# Patient Record
Sex: Female | Born: 1940 | Race: Black or African American | Hispanic: No | Marital: Married | Smoking: Never smoker
Health system: Southern US, Community
[De-identification: ages and names within clinical notes are randomized; demographics above are authoritative.]

## PROBLEM LIST (undated history)

## (undated) DIAGNOSIS — G43909 Migraine, unspecified, not intractable, without status migrainosus: Secondary | ICD-10-CM

## (undated) DIAGNOSIS — E785 Hyperlipidemia, unspecified: Secondary | ICD-10-CM

## (undated) DIAGNOSIS — Z780 Asymptomatic menopausal state: Secondary | ICD-10-CM

## (undated) DIAGNOSIS — I10 Essential (primary) hypertension: Secondary | ICD-10-CM

## (undated) DIAGNOSIS — M545 Low back pain, unspecified: Secondary | ICD-10-CM

## (undated) HISTORY — DX: Essential (primary) hypertension: I10

## (undated) HISTORY — DX: Low back pain: M54.5

## (undated) HISTORY — DX: Hyperlipidemia, unspecified: E78.5

## (undated) HISTORY — DX: Migraine, unspecified, not intractable, without status migrainosus: G43.909

## (undated) HISTORY — DX: Low back pain, unspecified: M54.50

## (undated) HISTORY — DX: Asymptomatic menopausal state: Z78.0

---

## 1980-06-19 HISTORY — PX: CHOLECYSTECTOMY: SHX55

## 1982-06-19 HISTORY — PX: TUBAL LIGATION: SHX77

## 2012-10-21 ENCOUNTER — Ambulatory Visit: Payer: Self-pay

## 2012-11-16 ENCOUNTER — Ambulatory Visit: Payer: Self-pay

## 2012-12-18 ENCOUNTER — Ambulatory Visit: Payer: Self-pay | Admitting: Anesthesiology

## 2013-01-27 LAB — HM PAP SMEAR

## 2013-01-28 ENCOUNTER — Ambulatory Visit: Payer: Self-pay | Admitting: Anesthesiology

## 2013-02-10 ENCOUNTER — Ambulatory Visit: Payer: Self-pay | Admitting: Anesthesiology

## 2013-03-11 ENCOUNTER — Ambulatory Visit: Payer: Self-pay | Admitting: Anesthesiology

## 2013-03-24 ENCOUNTER — Ambulatory Visit: Payer: Self-pay | Admitting: Gastroenterology

## 2013-03-24 LAB — HM COLONOSCOPY

## 2013-08-26 ENCOUNTER — Ambulatory Visit: Payer: Self-pay | Admitting: Anesthesiology

## 2013-10-02 ENCOUNTER — Ambulatory Visit: Payer: Self-pay | Admitting: Anesthesiology

## 2014-03-12 LAB — HM MAMMOGRAPHY

## 2014-03-20 LAB — HM MAMMOGRAPHY

## 2014-03-26 ENCOUNTER — Ambulatory Visit: Payer: Self-pay | Admitting: Anesthesiology

## 2014-04-28 ENCOUNTER — Ambulatory Visit: Payer: Self-pay | Admitting: Anesthesiology

## 2014-08-04 DIAGNOSIS — I10 Essential (primary) hypertension: Secondary | ICD-10-CM | POA: Diagnosis not present

## 2014-08-04 DIAGNOSIS — H5203 Hypermetropia, bilateral: Secondary | ICD-10-CM | POA: Diagnosis not present

## 2014-08-04 DIAGNOSIS — H52223 Regular astigmatism, bilateral: Secondary | ICD-10-CM | POA: Diagnosis not present

## 2014-08-04 DIAGNOSIS — H35033 Hypertensive retinopathy, bilateral: Secondary | ICD-10-CM | POA: Diagnosis not present

## 2014-08-11 DIAGNOSIS — I129 Hypertensive chronic kidney disease with stage 1 through stage 4 chronic kidney disease, or unspecified chronic kidney disease: Secondary | ICD-10-CM | POA: Diagnosis not present

## 2014-08-11 DIAGNOSIS — I10 Essential (primary) hypertension: Secondary | ICD-10-CM | POA: Diagnosis not present

## 2014-08-11 DIAGNOSIS — R7301 Impaired fasting glucose: Secondary | ICD-10-CM | POA: Diagnosis not present

## 2014-08-11 DIAGNOSIS — R7302 Impaired glucose tolerance (oral): Secondary | ICD-10-CM | POA: Diagnosis not present

## 2015-03-13 ENCOUNTER — Other Ambulatory Visit: Payer: Self-pay | Admitting: Unknown Physician Specialty

## 2015-03-18 ENCOUNTER — Telehealth: Payer: Self-pay | Admitting: Unknown Physician Specialty

## 2015-03-18 NOTE — Telephone Encounter (Signed)
Refill for amlodipine sent to [facility] [place]

## 2015-03-18 NOTE — Telephone Encounter (Signed)
Ellen [name] refilled this medication 3 days ago for 3 days so I tried to call the patient to let her know. I was going to tell her to call her pharmacy and to not use the old rx number on the bottle. Asked for patient to please return my call so I could let her know this information.

## 2015-03-19 NOTE — Telephone Encounter (Signed)
Called and left patient a voicemail asking for her to please return my call.  

## 2015-03-22 NOTE — Telephone Encounter (Signed)
Called and left patient a voicemail asking for her to return my call. I was reading my past documentation for this encounter and I meant that the medication was refilled for 30 days and not 3 days.

## 2015-03-29 DIAGNOSIS — R809 Proteinuria, unspecified: Secondary | ICD-10-CM | POA: Insufficient documentation

## 2015-03-29 DIAGNOSIS — M545 Low back pain, unspecified: Secondary | ICD-10-CM | POA: Insufficient documentation

## 2015-03-29 DIAGNOSIS — Z780 Asymptomatic menopausal state: Secondary | ICD-10-CM | POA: Insufficient documentation

## 2015-03-29 DIAGNOSIS — R7301 Impaired fasting glucose: Secondary | ICD-10-CM

## 2015-03-29 DIAGNOSIS — G43909 Migraine, unspecified, not intractable, without status migrainosus: Secondary | ICD-10-CM | POA: Insufficient documentation

## 2015-03-29 DIAGNOSIS — I129 Hypertensive chronic kidney disease with stage 1 through stage 4 chronic kidney disease, or unspecified chronic kidney disease: Secondary | ICD-10-CM

## 2015-03-30 ENCOUNTER — Encounter: Payer: Self-pay | Admitting: Unknown Physician Specialty

## 2015-03-30 ENCOUNTER — Ambulatory Visit (INDEPENDENT_AMBULATORY_CARE_PROVIDER_SITE_OTHER): Payer: Medicare Other | Admitting: Unknown Physician Specialty

## 2015-03-30 VITALS — BP 138/84 | HR 84 | Temp 99.0°F | Ht 61.8 in | Wt 174.2 lb

## 2015-03-30 DIAGNOSIS — I129 Hypertensive chronic kidney disease with stage 1 through stage 4 chronic kidney disease, or unspecified chronic kidney disease: Secondary | ICD-10-CM | POA: Diagnosis not present

## 2015-03-30 DIAGNOSIS — Z0000 Encounter for general adult medical examination without abnormal findings: Secondary | ICD-10-CM | POA: Diagnosis not present

## 2015-03-30 DIAGNOSIS — Z23 Encounter for immunization: Secondary | ICD-10-CM

## 2015-03-30 DIAGNOSIS — R7301 Impaired fasting glucose: Secondary | ICD-10-CM

## 2015-03-30 LAB — MICROALBUMIN, URINE WAIVED
Creatinine, Urine Waived: 50 mg/dL (ref 10–300)
Microalb, Ur Waived: 10 mg/L (ref 0–19)

## 2015-03-30 LAB — LIPID PANEL PICCOLO, WAIVED
CHOLESTEROL PICCOLO, WAIVED: 204 mg/dL — AB (ref <200)
Chol/HDL Ratio Piccolo,Waive: 4.1 mg/dL
HDL CHOL PICCOLO, WAIVED: 50 mg/dL — AB (ref >59)
LDL CHOL CALC PICCOLO WAIVED: 114 mg/dL — AB (ref <100)
Triglycerides Piccolo,Waived: 196 mg/dL — ABNORMAL HIGH (ref <150)
VLDL Chol Calc Piccolo,Waive: 39 mg/dL — ABNORMAL HIGH (ref <30)

## 2015-03-30 LAB — BAYER DCA HB A1C WAIVED: HB A1C: 6.2 % (ref <7.0)

## 2015-03-30 MED ORDER — LISINOPRIL-HYDROCHLOROTHIAZIDE 20-12.5 MG PO TABS: 1.0000 | ORAL_TABLET | Freq: Every day | ORAL | Status: DC

## 2015-03-30 MED ORDER — AMLODIPINE BESYLATE 5 MG PO TABS: 5.0000 mg | ORAL_TABLET | Freq: Every day | ORAL | Status: DC

## 2015-03-30 NOTE — Patient Instructions (Addendum)
Health Maintenance  Topic Date Due  . ZOSTAVAX  06/15/2003  . INFLUENZA VACCINE  01/18/2016  . MAMMOGRAM  03/20/2016  . TETANUS/TDAP  07/21/2018  . COLONOSCOPY  03/25/2023  . DEXA SCAN  Completed  . PNA vac Low Risk Adult  Completed   Prescription given for shingles vaccine.  Please bring to your pharmacy

## 2015-03-30 NOTE — Assessment & Plan Note (Addendum)
Stable, continue present medications.  CMP pending

## 2015-03-30 NOTE — Progress Notes (Signed)
BP 138/84 mmHg  Pulse 84  Temp(Src) 99 F (37.2 C)  Ht 5' 1.8" (1.57 m)  Wt 174 lb 3.2 oz (79.017 kg)  BMI 32.06 kg/m2  SpO2 98%  LMP 11/28/1994 (Approximate)   Subjective:    Patient ID: Ellen [name], female    DOB: 12-26-1940, 74 y.o.   MRN: [id number]  HPI: Ellen [name] is a 74 y.o. female  Chief Complaint  Patient presents with  . Medicare Wellness   Hypertension This is a chronic problem. The current episode started today. The problem is controlled. Pertinent negatives include no blurred vision, chest pain, malaise/fatigue, palpitations or shortness of breath. There are no compliance problems.  There is no history of angina, kidney disease or heart failure.  Hyperlipidemia This is a chronic problem. The problem is controlled. Recent lipid tests were reviewed and are normal. There are no known factors aggravating her hyperlipidemia. Pertinent negatives include no chest pain, myalgias or shortness of breath. The current treatment provides significant improvement of lipids. There are no compliance problems.   Diabetes She presents for her follow-up diabetic visit. She has type 2 diabetes mellitus. Her disease course has been stable (6.6 last visit). There are no hypoglycemic associated symptoms. Pertinent negatives for diabetes include no blurred vision, no chest pain, no fatigue, no polydipsia, no polyuria and no weakness. There are no hypoglycemic complications. Symptoms are stable. Her weight is stable. She rarely participates in exercise. There is no compliance with monitoring of blood glucose.   Fall Risk  03/30/2015  Falls in the past year? No   Depression screen PHQ 2/9 03/30/2015  Decreased Interest 0  Down, Depressed, Hopeless 0  PHQ - 2 Score 0    Functional Status Survey: Is the patient deaf or have difficulty hearing?: No Does the patient have difficulty seeing, even when wearing glasses/contacts?: No Does the patient have difficulty concentrating,  remembering, or making decisions?: No Does the patient have difficulty walking or climbing stairs?: No Does the patient have difficulty dressing or bathing?: No Does the patient have difficulty doing errands alone such as visiting a doctor's office or shopping?: No  Advanced directives updated and information given.    Relevant past medical, surgical, family and social history reviewed and updated as indicated. Interim medical history since our last visit reviewed. Allergies and medications reviewed and updated.  See functional status, depression screen, and fall's risk assessment  under the appropriate section.    Pt is able to perform complex mental tasks, recognize clock face, recognize time and do a 3 item recall.     Relevant past medical, surgical, family and social history reviewed and updated as indicated. Interim medical history since our last visit reviewed. Allergies and medications reviewed and updated.  Review of Systems  Constitutional: Negative.  Negative for malaise/fatigue and fatigue.  HENT: Negative.   Eyes: Negative.  Negative for blurred vision.  Respiratory: Negative.  Negative for shortness of breath.   Cardiovascular: Negative.  Negative for chest pain and palpitations.  Gastrointestinal: Negative.   Endocrine: Negative.  Negative for polydipsia and polyuria.  Genitourinary: Negative.   Musculoskeletal: Negative.  Negative for myalgias.  Skin: Negative.   Allergic/Immunologic: Negative.   Neurological: Negative.  Negative for weakness.  Hematological: Negative.   Psychiatric/Behavioral: Negative.     Per HPI unless specifically indicated above     Objective:    BP 138/84 mmHg  Pulse 84  Temp(Src) 99 F (37.2 C)  Ht 5' 1.8" (1.57 m)  Wt 174 lb 3.2 oz (79.017 kg)  BMI 32.06 kg/m2  SpO2 98%  LMP 11/28/1994 (Approximate)  Wt Readings from Last 3 Encounters:  03/30/15 174 lb 3.2 oz (79.017 kg)  08/11/14 174 lb (78.926 kg)    Physical Exam   Constitutional: She is oriented to person, place, and time. She appears well-developed and well-nourished.  HENT:  Head: Normocephalic and atraumatic.  Eyes: Pupils are equal, round, and reactive to light. Right eye exhibits no discharge. Left eye exhibits no discharge. No scleral icterus.  Neck: Normal range of motion. Neck supple. Carotid bruit is not present. No thyromegaly present.  Cardiovascular: Normal rate, regular rhythm and normal heart sounds.  Exam reveals no gallop and no friction rub.   No murmur heard. Pulmonary/Chest: Effort normal and breath sounds normal. No respiratory distress. She has no wheezes. She has no rales.  Abdominal: Soft. Bowel sounds are normal. There is no tenderness. There is no rebound.  Genitourinary: No breast swelling, tenderness or discharge.  Musculoskeletal: Normal range of motion.  Lymphadenopathy:    She has no cervical adenopathy.  Neurological: She is alert and oriented to person, place, and time.  .Skin: Skin is warm, dry and intact. No rash noted.  Psychiatric: She has a normal mood and affect. Her speech is normal and behavior is normal. Judgment and thought content normal. Cognition and memory are normal.      Assessment & Plan:   Problem List Items Addressed This Visit      Unprioritized   IFG (impaired fasting glucose)    Hgb A1C is 6.2      Relevant Orders   Bayer DCA Hb A1c Waived   Hypertensive CKD (chronic kidney disease)    Stable, continue present medications.  CMP pending      Relevant Orders   Comprehensive metabolic panel   Bayer DCA Hb E9B Waived   Lipid Panel Piccolo, Waived   Microalbumin, Urine Waived   Uric acid    Other Visit Diagnoses    Immunization due    -  Primary    Relevant Orders    Flu Vaccine QUAD 36+ mos PF IM (Fluarix & Fluzone Quad PF) (Completed)    Routine general medical examination at a health care facility            Follow up plan: Return in about 6 months (around  09/28/2015).

## 2015-03-30 NOTE — Assessment & Plan Note (Signed)
Hgb A1C is 6.2

## 2015-03-31 ENCOUNTER — Encounter: Payer: Self-pay | Admitting: Unknown Physician Specialty

## 2015-03-31 LAB — COMPREHENSIVE METABOLIC PANEL
A/G RATIO: 1.4 (ref 1.1–2.5)
ALK PHOS: 97 IU/L (ref 39–117)
ALT: 18 IU/L (ref 0–32)
AST: 21 IU/L (ref 0–40)
Albumin: 4.2 g/dL (ref 3.5–4.8)
BUN/Creatinine Ratio: 19 (ref 11–26)
BUN: 18 mg/dL (ref 8–27)
CALCIUM: 9.7 mg/dL (ref 8.7–10.3)
CHLORIDE: 99 mmol/L (ref 97–108)
CO2: 27 mmol/L (ref 18–29)
Creatinine, Ser: 0.96 mg/dL (ref 0.57–1.00)
GFR calc Af Amer: 69 mL/min/{1.73_m2} (ref >59)
GFR, EST NON AFRICAN AMERICAN: 60 mL/min/{1.73_m2} (ref >59)
GLOBULIN, TOTAL: 2.9 g/dL (ref 1.5–4.5)
Glucose: 102 mg/dL — ABNORMAL HIGH (ref 65–99)
POTASSIUM: 4.1 mmol/L (ref 3.5–5.2)
SODIUM: 141 mmol/L (ref 134–144)
Total Protein: 7.1 g/dL (ref 6.0–8.5)

## 2015-03-31 LAB — URIC ACID: URIC ACID: 4.3 mg/dL (ref 2.5–7.1)

## 2015-03-31 LAB — LIPID PANEL W/O CHOL/HDL RATIO
CHOLESTEROL TOTAL: 206 mg/dL — AB (ref 100–199)
HDL: 45 mg/dL (ref >39)
LDL CALC: 124 mg/dL — AB (ref 0–99)
Triglycerides: 186 mg/dL — ABNORMAL HIGH (ref 0–149)
VLDL CHOLESTEROL CAL: 37 mg/dL (ref 5–40)

## 2015-07-07 ENCOUNTER — Telehealth: Payer: Self-pay | Admitting: Unknown Physician Specialty

## 2015-07-07 DIAGNOSIS — M545 Low back pain, unspecified: Secondary | ICD-10-CM

## 2015-07-07 NOTE — Telephone Encounter (Signed)
Called and left patient a voicemail asking for her to please return my call.  

## 2015-07-07 NOTE — Telephone Encounter (Signed)
Pt came in stated she has not been to the pain clinic in over a year and needs a new referral to see Dr. Madelaine Bhat [facility]. Pt stated she is having severe pain in her l left and ankle like she had before. Please call pt with any issues or concerns. Thanks.

## 2015-07-07 NOTE — Telephone Encounter (Signed)
Is this the back pain she was having before.  Left sided back pain radiating to ankle?

## 2015-07-07 NOTE — Telephone Encounter (Signed)
Routing to provider  

## 2015-07-08 NOTE — Telephone Encounter (Signed)
Called and left patient a message asking for her to please return my call.

## 2015-07-09 NOTE — Telephone Encounter (Signed)
I will refer her to the pain clinic

## 2015-07-09 NOTE — Telephone Encounter (Signed)
Routing to provider  

## 2015-07-09 NOTE — Telephone Encounter (Signed)
Pt came in stated it is the same back pain she had before that radiates down her leg into her feet. Please call pt @ [phone] if needed. Pt would like a referral to the [facility]. Thanks.

## 2015-09-28 ENCOUNTER — Ambulatory Visit: Payer: Medicare Other | Admitting: Unknown Physician Specialty

## 2015-10-04 ENCOUNTER — Encounter: Payer: Self-pay | Admitting: Unknown Physician Specialty

## 2015-10-04 ENCOUNTER — Ambulatory Visit (INDEPENDENT_AMBULATORY_CARE_PROVIDER_SITE_OTHER): Payer: Medicare Other | Admitting: Unknown Physician Specialty

## 2015-10-04 VITALS — BP 128/72 | HR 92 | Temp 98.6°F | Ht 61.5 in | Wt 171.0 lb

## 2015-10-04 DIAGNOSIS — R7301 Impaired fasting glucose: Secondary | ICD-10-CM

## 2015-10-04 DIAGNOSIS — I10 Essential (primary) hypertension: Secondary | ICD-10-CM | POA: Diagnosis not present

## 2015-10-04 DIAGNOSIS — I152 Hypertension secondary to endocrine disorders: Secondary | ICD-10-CM | POA: Insufficient documentation

## 2015-10-04 MED ORDER — AMLODIPINE BESYLATE 5 MG PO TABS: 5.0000 mg | ORAL_TABLET | Freq: Every day | ORAL | Status: DC

## 2015-10-04 MED ORDER — LISINOPRIL-HYDROCHLOROTHIAZIDE 20-12.5 MG PO TABS: 1.0000 | ORAL_TABLET | Freq: Every day | ORAL | Status: DC

## 2015-10-04 NOTE — Progress Notes (Signed)
BP 128/72 mmHg  Pulse 92  Temp(Src) 98.6 F (37 C)  Ht 5' 1.5" (1.562 m)  Wt 171 lb (77.565 kg)  BMI 31.79 kg/m2  SpO2 97%  LMP 11/28/1994 (Approximate)   Subjective:    Patient ID: Ellen [name], female    DOB: 1941/01/20, 75 y.o.   MRN: [id number]  HPI: Ellen [name] is a 75 y.o. female  Chief Complaint  Patient presents with  . Hypertension   Diabetes:  Diet controlled with last Hgb A1C of 6.2 No hypoglycemic episodes No hyperglycemic episodes Feet problems: none Blood Sugars averaging:Not checking  Hypertension:  Using medications without difficulty Average home BPs   Using medication without problems or lightheadedness No chest pain with exertion or shortness of breath No Edema        Relevant past medical, surgical, family and social history reviewed and updated as indicated. Interim medical history since our last visit reviewed. Allergies and medications reviewed and updated.  Review of Systems  Per HPI unless specifically indicated above     Objective:    BP 128/72 mmHg  Pulse 92  Temp(Src) 98.6 F (37 C)  Ht 5' 1.5" (1.562 m)  Wt 171 lb (77.565 kg)  BMI 31.79 kg/m2  SpO2 97%  LMP 11/28/1994 (Approximate)  Wt Readings from Last 3 Encounters:  10/04/15 171 lb (77.565 kg)  03/30/15 174 lb 3.2 oz (79.017 kg)  08/11/14 174 lb (78.926 kg)    Physical Exam  Constitutional: She is oriented to person, place, and time. She appears well-developed and well-nourished. No distress.  HENT:  Head: Normocephalic and atraumatic.  Eyes: Conjunctivae and lids are normal. Right eye exhibits no discharge. Left eye exhibits no discharge. No scleral icterus.  Neck: Normal range of motion. Neck supple. No JVD present. Carotid bruit is not present.  Cardiovascular: Normal rate, regular rhythm and normal heart sounds.   Pulmonary/Chest: Effort normal and breath sounds normal.  Abdominal: Normal appearance. There is no splenomegaly or hepatomegaly.   Musculoskeletal: Normal range of motion.  Neurological: She is alert and oriented to person, place, and time.  Skin: Skin is warm, dry and intact. No rash noted. No pallor.  Psychiatric: She has a normal mood and affect. Her behavior is normal. Judgment and thought content normal.    Results for orders placed or performed in visit on 03/30/15  Comprehensive metabolic panel  Result Value Ref Range   Glucose 102 (H) 65 - 99 mg/dL   BUN 18 8 - 27 mg/dL   Creatinine, Ser 4.09 0.57 - 1.00 mg/dL   GFR calc non Af Amer 60 >59 mL/min/1.73   GFR calc Af Amer 69 >59 mL/min/1.73   BUN/Creatinine Ratio 19 11 - 26   Sodium 141 134 - 144 mmol/L   Potassium 4.1 3.5 - 5.2 mmol/L   Chloride 99 97 - 108 mmol/L   CO2 27 18 - 29 mmol/L   Calcium 9.7 8.7 - 10.3 mg/dL   Total Protein 7.1 6.0 - 8.5 g/dL   Albumin 4.2 3.5 - 4.8 g/dL   Globulin, Total 2.9 1.5 - 4.5 g/dL   Albumin/Globulin Ratio 1.4 1.1 - 2.5   Bilirubin Total <0.2 0.0 - 1.2 mg/dL   Alkaline Phosphatase 97 39 - 117 IU/L   AST 21 0 - 40 IU/L   ALT 18 0 - 32 IU/L  Bayer DCA Hb A1c Waived  Result Value Ref Range   Bayer DCA Hb A1c Waived 6.2 <7.0 %  Lipid Panel Piccolo,  Waived  Result Value Ref Range   Cholesterol Piccolo, Waived 204 (H) <200 mg/dL   HDL Chol Piccolo, Waived 50 (L) >59 mg/dL   Triglycerides Piccolo,Waived 196 (H) <150 mg/dL   Chol/HDL Ratio Piccolo,Waive 4.1 mg/dL   LDL Chol Calc Piccolo Waived 114 (H) <100 mg/dL   VLDL Chol Calc Piccolo,Waive 39 (H) <30 mg/dL  Microalbumin, Urine Waived  Result Value Ref Range   Microalb, Ur Waived 10 0 - 19 mg/L   Creatinine, Urine Waived 50 10 - 300 mg/dL   Microalb/Creat Ratio 30-300 (H) <30 mg/g  Uric acid  Result Value Ref Range   Uric Acid 4.3 2.5 - 7.1 mg/dL  Lipid Panel w/o Chol/HDL Ratio  Result Value Ref Range   Cholesterol, Total 206 (H) 100 - 199 mg/dL   Triglycerides 621186 (H) 0 - 149 mg/dL   HDL 45 >30>39 mg/dL   VLDL Cholesterol Cal 37 5 - 40 mg/dL   LDL  Calculated 865124 (H) 0 - 99 mg/dL      Assessment & Plan:   Problem List Items Addressed This Visit      Unprioritized   Hypertension    Stable, continue present medications.        Relevant Medications   amLODipine (NORVASC) 5 MG tablet   lisinopril-hydrochlorothiazide (PRINZIDE,ZESTORETIC) 20-12.5 MG tablet   IFG (impaired fasting glucose) - Primary    Pt with a stable Hgb A1C for years.  Last was 6.2.  Weight is stable and even lost a few pounds          Follow up plan: Return for 6 months for PE.

## 2015-10-04 NOTE — Assessment & Plan Note (Signed)
Stable, continue present medications.   

## 2015-10-04 NOTE — Assessment & Plan Note (Addendum)
Pt with a stable Hgb A1C for years.  Last was 6.2.  Weight is stable and even lost a few pounds

## 2016-01-28 ENCOUNTER — Ambulatory Visit: Payer: Self-pay | Admitting: Unknown Physician Specialty

## 2016-01-28 ENCOUNTER — Ambulatory Visit (INDEPENDENT_AMBULATORY_CARE_PROVIDER_SITE_OTHER): Payer: Medicare Other | Admitting: Family Medicine

## 2016-01-28 ENCOUNTER — Encounter: Payer: Self-pay | Admitting: Family Medicine

## 2016-01-28 VITALS — BP 133/78 | HR 91 | Temp 98.7°F | Wt 173.0 lb

## 2016-01-28 DIAGNOSIS — S161XXA Strain of muscle, fascia and tendon at neck level, initial encounter: Secondary | ICD-10-CM | POA: Diagnosis not present

## 2016-01-28 MED ORDER — CYCLOBENZAPRINE HCL 10 MG PO TABS: 10.0000 mg | ORAL_TABLET | Freq: Three times a day (TID) | ORAL | 0 refills | Status: DC | PRN

## 2016-01-28 MED ORDER — KETOROLAC TROMETHAMINE 60 MG/2ML IM SOLN
60.0000 mg | Freq: Once | INTRAMUSCULAR | Status: AC
  Administered 2016-01-28: 60 mg via INTRAMUSCULAR

## 2016-01-28 NOTE — Progress Notes (Signed)
   BP 133/78   Pulse 91   Temp 98.7 F (37.1 C)   Wt 173 lb (78.5 kg)   LMP 11/28/1994 (Approximate)   SpO2 99%   BMI 32.16 kg/m    Subjective:    Patient ID: Ellen [name], female    DOB: 06-22-1940, 75 y.o.   MRN: [id number]  HPI: Ellen [name] is a 75 y.o. female  Chief Complaint  Patient presents with  . Neck Pain    stumbled on some cords on Sunday, didn't fall completely, she caught herself. Left side of neck hurts worse, hurts to turn her neck/head.    Left sided neck pain x 4 days following tripping on some cords. States she did not actually fall, just stumbled and feels like she jerked her neck. Is now having dull ache and stiffness in left neck. Denies radiation down arms or UE weakness. Denies sharp midline neck pain. Has been taking occasional aleve with some relief.   Relevant past medical, surgical, family and social history reviewed and updated as indicated. Interim medical history since our last visit reviewed. Allergies and medications reviewed and updated.  Review of Systems  Constitutional: Negative.   HENT: Negative.   Respiratory: Negative.   Cardiovascular: Negative.   Gastrointestinal: Negative.   Musculoskeletal: Positive for neck pain and neck stiffness.  Neurological: Negative.   Psychiatric/Behavioral: Negative.     Per HPI unless specifically indicated above     Objective:    BP 133/78   Pulse 91   Temp 98.7 F (37.1 C)   Wt 173 lb (78.5 kg)   LMP 11/28/1994 (Approximate)   SpO2 99%   BMI 32.16 kg/m   Wt Readings from Last 3 Encounters:  01/28/16 173 lb (78.5 kg)  10/04/15 171 lb (77.6 kg)  03/30/15 174 lb 3.2 oz (79 kg)    Physical Exam  Constitutional: She is oriented to person, place, and time. She appears well-developed and well-nourished.  HENT:  Head: Atraumatic.  Eyes: Conjunctivae are normal. No scleral icterus.  Neck: Neck supple.  Cardiovascular: Normal rate and normal heart sounds.   Pulmonary/Chest: Effort  normal. No respiratory distress.  Musculoskeletal: She exhibits tenderness (TTP over left trapezius to left cervical paraspinal muscles).  Reduced active ROM of neck d/t stiffness  Lymphadenopathy:    She has no cervical adenopathy.  Neurological: She is alert and oriented to person, place, and time.  Skin: Skin is warm and dry.  Psychiatric: She has a normal mood and affect. Her behavior is normal.      Assessment & Plan:   Problem List Items Addressed This Visit    None    Visit Diagnoses    Cervical strain, initial encounter    -  Primary   IM toradol given today, flexeril sent. Discussed possible side effects and recommended no driving on the medication due to sedation.    Relevant Medications   ketorolac (TORADOL) injection 60 mg (Completed)    Recommended gentle stretches, massages with biofreeze or icy hot, and warm epsom salt baths. Can resume aleve prn in several days.   Follow up plan: Return if symptoms worsen or fail to improve.

## 2016-01-28 NOTE — Patient Instructions (Signed)
Follow up as needed

## 2016-02-01 DIAGNOSIS — M542 Cervicalgia: Secondary | ICD-10-CM | POA: Diagnosis not present

## 2016-02-01 DIAGNOSIS — S161XXA Strain of muscle, fascia and tendon at neck level, initial encounter: Secondary | ICD-10-CM | POA: Diagnosis not present

## 2016-03-06 ENCOUNTER — Ambulatory Visit (INDEPENDENT_AMBULATORY_CARE_PROVIDER_SITE_OTHER): Payer: Medicare Other | Admitting: Unknown Physician Specialty

## 2016-03-06 ENCOUNTER — Encounter: Payer: Self-pay | Admitting: Unknown Physician Specialty

## 2016-03-06 VITALS — BP 136/79 | HR 91 | Temp 98.6°F | Ht 62.0 in | Wt 170.8 lb

## 2016-03-06 DIAGNOSIS — Z23 Encounter for immunization: Secondary | ICD-10-CM | POA: Diagnosis not present

## 2016-03-06 NOTE — Progress Notes (Signed)
BP 136/79 (BP Location: Left Arm)   Pulse 91   Temp 98.6 F (37 C)   Ht 5\' 2"  (1.575 m)   Wt 170 lb 12.8 oz (77.5 kg)   LMP 11/28/1994 (Approximate)   SpO2 99%   BMI 31.24 kg/m    Subjective:    Patient ID: Ellen [name], female    DOB: 1940/09/08, 75 y.o.   MRN: [id number]  HPI: Ellen [name] is a 75 y.o. female  Chief Complaint  Patient presents with  . Paperwork    pt states she cannot sit through jury duty    Pt is here to request jury duty excuse.  Upon further review, she is eligible to not serve on a jury due to age.    Relevant past medical, surgical, family and social history reviewed and updated as indicated. Interim medical history since our last visit reviewed. Allergies and medications reviewed and updated.  Review of Systems  Per HPI unless specifically indicated above     Objective:    BP 136/79 (BP Location: Left Arm)   Pulse 91   Temp 98.6 F (37 C)   Ht 5\' 2"  (1.575 m)   Wt 170 lb 12.8 oz (77.5 kg)   LMP 11/28/1994 (Approximate)   SpO2 99%   BMI 31.24 kg/m   Wt Readings from Last 3 Encounters:  03/06/16 170 lb 12.8 oz (77.5 kg)  01/28/16 173 lb (78.5 kg)  10/04/15 171 lb (77.6 kg)    Physical Exam  Results for orders placed or performed in visit on 03/30/15  Comprehensive metabolic panel  Result Value Ref Range   Glucose 102 (H) 65 - 99 mg/dL   BUN 18 8 - 27 mg/dL   Creatinine, Ser 6.440.96 0.57 - 1.00 mg/dL   GFR calc non Af Amer 60 >59 mL/min/1.73   GFR calc Af Amer 69 >59 mL/min/1.73   BUN/Creatinine Ratio 19 11 - 26   Sodium 141 134 - 144 mmol/L   Potassium 4.1 3.5 - 5.2 mmol/L   Chloride 99 97 - 108 mmol/L   CO2 27 18 - 29 mmol/L   Calcium 9.7 8.7 - 10.3 mg/dL   Total Protein 7.1 6.0 - 8.5 g/dL   Albumin 4.2 3.5 - 4.8 g/dL   Globulin, Total 2.9 1.5 - 4.5 g/dL   Albumin/Globulin Ratio 1.4 1.1 - 2.5   Bilirubin Total <0.2 0.0 - 1.2 mg/dL   Alkaline Phosphatase 97 39 - 117 IU/L   AST 21 0 - 40 IU/L   ALT 18 0 - 32  IU/L  Bayer DCA Hb A1c Waived  Result Value Ref Range   Bayer DCA Hb A1c Waived 6.2 <7.0 %  Lipid Panel Piccolo, Waived  Result Value Ref Range   Cholesterol Piccolo, Waived 204 (H) <200 mg/dL   HDL Chol Piccolo, Waived 50 (L) >59 mg/dL   Triglycerides Piccolo,Waived 196 (H) <150 mg/dL   Chol/HDL Ratio Piccolo,Waive 4.1 mg/dL   LDL Chol Calc Piccolo Waived 114 (H) <100 mg/dL   VLDL Chol Calc Piccolo,Waive 39 (H) <30 mg/dL  Microalbumin, Urine Waived  Result Value Ref Range   Microalb, Ur Waived 10 0 - 19 mg/L   Creatinine, Urine Waived 50 10 - 300 mg/dL   Microalb/Creat Ratio 30-300 (H) <30 mg/g  Uric acid  Result Value Ref Range   Uric Acid 4.3 2.5 - 7.1 mg/dL  Lipid Panel w/o Chol/HDL Ratio  Result Value Ref Range   Cholesterol, Total 206 (H) 100 -  199 mg/dL   Triglycerides 161 (H) 0 - 149 mg/dL   HDL 45 >09 mg/dL   VLDL Cholesterol Cal 37 5 - 40 mg/dL   LDL Calculated 604 (H) 0 - 99 mg/dL      Assessment & Plan:   Problem List Items Addressed This Visit    None    Visit Diagnoses    Immunization due    -  Primary   Relevant Orders   Flu vaccine HIGH DOSE PF (Completed)      No charge visit.  Discussed how she can excuse herself according to directions on the jury summons  Follow up plan: Return if symptoms worsen or fail to improve.

## 2016-03-06 NOTE — Patient Instructions (Signed)

## 2016-04-03 ENCOUNTER — Encounter: Payer: Self-pay | Admitting: Unknown Physician Specialty

## 2016-04-03 ENCOUNTER — Ambulatory Visit (INDEPENDENT_AMBULATORY_CARE_PROVIDER_SITE_OTHER): Payer: Medicare Other | Admitting: Unknown Physician Specialty

## 2016-04-03 VITALS — BP 129/74 | HR 97 | Temp 98.3°F | Ht 62.2 in | Wt 168.4 lb

## 2016-04-03 DIAGNOSIS — I10 Essential (primary) hypertension: Secondary | ICD-10-CM | POA: Diagnosis not present

## 2016-04-03 DIAGNOSIS — Z0000 Encounter for general adult medical examination without abnormal findings: Secondary | ICD-10-CM

## 2016-04-03 DIAGNOSIS — Z1231 Encounter for screening mammogram for malignant neoplasm of breast: Secondary | ICD-10-CM

## 2016-04-03 DIAGNOSIS — R7301 Impaired fasting glucose: Secondary | ICD-10-CM

## 2016-04-03 LAB — BAYER DCA HB A1C WAIVED: HB A1C (BAYER DCA - WAIVED): 6.5 % (ref <7.0)

## 2016-04-03 MED ORDER — LISINOPRIL-HYDROCHLOROTHIAZIDE 20-12.5 MG PO TABS: 1.0000 | ORAL_TABLET | Freq: Every day | ORAL | 1 refills | Status: DC

## 2016-04-03 MED ORDER — AMLODIPINE BESYLATE 5 MG PO TABS: 5.0000 mg | ORAL_TABLET | Freq: Every day | ORAL | 1 refills | Status: DC

## 2016-04-03 NOTE — Assessment & Plan Note (Signed)
Stable, continue present medications.   

## 2016-04-03 NOTE — Progress Notes (Signed)
BP 129/74 (BP Location: Left Arm, Patient Position: Sitting, Cuff Size: Large)   Pulse 97   Temp 98.3 F (36.8 C)   Ht 5' 2.2" (1.58 m)   Wt 168 lb 6.4 oz (76.4 kg)   LMP 11/28/1994 (Approximate)   SpO2 97%   BMI 30.60 kg/m    Subjective:    Patient ID: Ellen [name], female    DOB: 1940-11-08, 75 y.o.   MRN: [id number]  HPI: Ellen [name] is a 75 y.o. female  Chief Complaint  Patient presents with  . Medicare Wellness   Functional Status Survey: Is the patient deaf or have difficulty hearing?: No Does the patient have difficulty seeing, even when wearing glasses/contacts?: No Does the patient have difficulty concentrating, remembering, or making decisions?: No Does the patient have difficulty walking or climbing stairs?: No Does the patient have difficulty dressing or bathing?: No Does the patient have difficulty doing errands alone such as visiting a doctor's office or shopping?: No Fall Risk  04/03/2016 01/28/2016 03/30/2015  Falls in the past year? Yes Yes No  Number falls in past yr: 1 1 -  Injury with Fall? No No -   [facility] 04/03/2016 03/30/2015  Decreased Interest 0 0  Down, Depressed, Hopeless 0 0  PHQ - 2 Score 0 0  Altered sleeping 0 -  Tired, decreased energy 0 -  Change in appetite 0 -  Feeling bad or failure about yourself  0 -  Trouble concentrating 0 -  Moving slowly or fidgety/restless 0 -  Suicidal thoughts 0 -  PHQ-9 Score 0 -   Hypertension Using medications without difficulty Average home BPs   No problems or lightheadedness No chest pain with exertion or shortness of breath No Edema  Social History   Social History  . Marital status: Married    Spouse name: N/A  . Number of children: N/A  . Years of education: N/A   Occupational History  . Not on file.   Social History Main Topics  . Smoking status: Never Smoker  . Smokeless tobacco: Never Used  . Alcohol use No  . Drug use: No  . Sexual activity:  Yes   Other Topics Concern  . Not on file   Social History Narrative  . No narrative on file   Family History  Problem Relation Age of Onset  . Alzheimer's disease Mother   . Diabetes Mother   . Stroke Mother   . Hyperlipidemia Mother   . Stroke Father   . Hypertension Father   . Hyperlipidemia Father   . Diabetes Sister   . Diabetes Brother   . Hypertension Brother   . Diabetes Sister    Past Medical History:  Diagnosis Date  . Hypertension   . Lumbago   . Menopause   . Migraines    Past Surgical History:  Procedure Laterality Date  . CHOLECYSTECTOMY    . TUBAL LIGATION     -    Relevant past medical, surgical, family and social history reviewed and updated as indicated. Interim medical history since our last visit reviewed. Allergies and medications reviewed and updated.  Review of Systems  Constitutional: Negative.   HENT: Negative.   Eyes: Negative.   Respiratory: Negative.   Cardiovascular: Negative.   Gastrointestinal: Negative.   Endocrine: Negative.   Genitourinary: Negative.   Musculoskeletal: Negative.   Skin: Negative.   Allergic/Immunologic: Negative.   Neurological: Negative.   Hematological: Negative.   Psychiatric/Behavioral: Negative.  Per HPI unless specifically indicated above     Objective:    BP 129/74 (BP Location: Left Arm, Patient Position: Sitting, Cuff Size: Large)   Pulse 97   Temp 98.3 F (36.8 C)   Ht 5' 2.2" (1.58 m)   Wt 168 lb 6.4 oz (76.4 kg)   LMP 11/28/1994 (Approximate)   SpO2 97%   BMI 30.60 kg/m   Wt Readings from Last 3 Encounters:  04/03/16 168 lb 6.4 oz (76.4 kg)  03/06/16 170 lb 12.8 oz (77.5 kg)  01/28/16 173 lb (78.5 kg)    Physical Exam  Constitutional: She is oriented to person, place, and time. She appears well-developed and well-nourished.  HENT:  Head: Normocephalic and atraumatic.  Eyes: Pupils are equal, round, and reactive to light. Right eye exhibits no discharge. Left eye exhibits  no discharge. No scleral icterus.  Neck: Normal range of motion. Neck supple. Carotid bruit is not present. No thyromegaly present.  Cardiovascular: Normal rate, regular rhythm and normal heart sounds.  Exam reveals no gallop and no friction rub.   No murmur heard. Pulmonary/Chest: Effort normal and breath sounds normal. No respiratory distress. She has no wheezes. She has no rales.  Abdominal: Soft. Bowel sounds are normal. There is no tenderness. There is no rebound.  Genitourinary: No breast swelling, tenderness or discharge.  Musculoskeletal: Normal range of motion.  Lymphadenopathy:    She has no cervical adenopathy.  Neurological: She is alert and oriented to person, place, and time.  Skin: Skin is warm, dry and intact. No rash noted.  Psychiatric: She has a normal mood and affect. Her speech is normal and behavior is normal. Judgment and thought content normal. Cognition and memory are normal.    Results for orders placed or performed in visit on 03/30/15  Comprehensive metabolic panel  Result Value Ref Range   Glucose 102 (H) 65 - 99 mg/dL   BUN 18 8 - 27 mg/dL   Creatinine, Ser 4.09 0.57 - 1.00 mg/dL   GFR calc non Af Amer 60 >59 mL/min/1.73   GFR calc Af Amer 69 >59 mL/min/1.73   BUN/Creatinine Ratio 19 11 - 26   Sodium 141 134 - 144 mmol/L   Potassium 4.1 3.5 - 5.2 mmol/L   Chloride 99 97 - 108 mmol/L   CO2 27 18 - 29 mmol/L   Calcium 9.7 8.7 - 10.3 mg/dL   Total Protein 7.1 6.0 - 8.5 g/dL   Albumin 4.2 3.5 - 4.8 g/dL   Globulin, Total 2.9 1.5 - 4.5 g/dL   Albumin/Globulin Ratio 1.4 1.1 - 2.5   Bilirubin Total <0.2 0.0 - 1.2 mg/dL   Alkaline Phosphatase 97 39 - 117 IU/L   AST 21 0 - 40 IU/L   ALT 18 0 - 32 IU/L  Bayer DCA Hb A1c Waived  Result Value Ref Range   Bayer DCA Hb A1c Waived 6.2 <7.0 %  Lipid Panel Piccolo, Waived  Result Value Ref Range   Cholesterol Piccolo, Waived 204 (H) <200 mg/dL   HDL Chol Piccolo, Waived 50 (L) >59 mg/dL   Triglycerides  Piccolo,Waived 196 (H) <150 mg/dL   Chol/HDL Ratio Piccolo,Waive 4.1 mg/dL   LDL Chol Calc Piccolo Waived 114 (H) <100 mg/dL   VLDL Chol Calc Piccolo,Waive 39 (H) <30 mg/dL  Microalbumin, Urine Waived  Result Value Ref Range   Microalb, Ur Waived 10 0 - 19 mg/L   Creatinine, Urine Waived 50 10 - 300 mg/dL   Microalb/Creat Ratio 30-300 (H) <  30 mg/g  Uric acid  Result Value Ref Range   Uric Acid 4.3 2.5 - 7.1 mg/dL  Lipid Panel w/o Chol/HDL Ratio  Result Value Ref Range   Cholesterol, Total 206 (H) 100 - 199 mg/dL   Triglycerides 161 (H) 0 - 149 mg/dL   HDL 45 >09 mg/dL   VLDL Cholesterol Cal 37 5 - 40 mg/dL   LDL Calculated 604 (H) 0 - 99 mg/dL      Assessment & Plan:   Problem List Items Addressed This Visit      Unprioritized   Hypertension    Stable, continue present medications.        Relevant Medications   amLODipine (NORVASC) 5 MG tablet   lisinopril-hydrochlorothiazide (PRINZIDE,ZESTORETIC) 20-12.5 MG tablet   Other Relevant Orders   Comprehensive metabolic panel   Lipid Panel w/o Chol/HDL Ratio   IFG (impaired fasting glucose) - Primary   Relevant Orders   Bayer DCA Hb A1c Waived    Other Visit Diagnoses    Encounter for screening mammogram for breast cancer       Relevant Orders   MM DIGITAL SCREENING BILATERAL   Annual physical exam           Follow up plan: Return in about 6 months (around 10/02/2016).

## 2016-04-03 NOTE — Patient Instructions (Signed)
Please do call to schedule your mammogram; the number to schedule one at either [facility] or [facility] is [phone]   

## 2016-04-04 ENCOUNTER — Encounter: Payer: Self-pay | Admitting: Unknown Physician Specialty

## 2016-04-04 LAB — COMPREHENSIVE METABOLIC PANEL
ALK PHOS: 108 IU/L (ref 39–117)
ALT: 15 IU/L (ref 0–32)
AST: 24 IU/L (ref 0–40)
Albumin/Globulin Ratio: 1.3 (ref 1.2–2.2)
Albumin: 3.9 g/dL (ref 3.5–4.8)
BILIRUBIN TOTAL: 0.3 mg/dL (ref 0.0–1.2)
BUN / CREAT RATIO: 19 (ref 12–28)
BUN: 20 mg/dL (ref 8–27)
CHLORIDE: 101 mmol/L (ref 96–106)
CO2: 26 mmol/L (ref 18–29)
CREATININE: 1.06 mg/dL — AB (ref 0.57–1.00)
Calcium: 9.5 mg/dL (ref 8.7–10.3)
GFR calc Af Amer: 61 mL/min/{1.73_m2} (ref >59)
GFR calc non Af Amer: 53 mL/min/{1.73_m2} — ABNORMAL LOW (ref >59)
GLUCOSE: 106 mg/dL — AB (ref 65–99)
Globulin, Total: 2.9 g/dL (ref 1.5–4.5)
Potassium: 4 mmol/L (ref 3.5–5.2)
Sodium: 143 mmol/L (ref 134–144)
Total Protein: 6.8 g/dL (ref 6.0–8.5)

## 2016-04-04 LAB — LIPID PANEL W/O CHOL/HDL RATIO
CHOLESTEROL TOTAL: 180 mg/dL (ref 100–199)
HDL: 40 mg/dL (ref >39)
LDL CALC: 95 mg/dL (ref 0–99)
TRIGLYCERIDES: 223 mg/dL — AB (ref 0–149)
VLDL CHOLESTEROL CAL: 45 mg/dL — AB (ref 5–40)

## 2016-05-24 ENCOUNTER — Ambulatory Visit
Admission: RE | Admit: 2016-05-24 | Discharge: 2016-05-24 | Disposition: A | Discharge status: Home / Self Care | Payer: Medicare Other | Source: Ambulatory Visit | Attending: Unknown Physician Specialty | Admitting: Unknown Physician Specialty

## 2016-05-24 DIAGNOSIS — Z1231 Encounter for screening mammogram for malignant neoplasm of breast: Secondary | ICD-10-CM | POA: Insufficient documentation

## 2016-09-29 DIAGNOSIS — H35033 Hypertensive retinopathy, bilateral: Secondary | ICD-10-CM | POA: Diagnosis not present

## 2016-09-29 DIAGNOSIS — H5203 Hypermetropia, bilateral: Secondary | ICD-10-CM | POA: Diagnosis not present

## 2016-09-29 DIAGNOSIS — I10 Essential (primary) hypertension: Secondary | ICD-10-CM | POA: Diagnosis not present

## 2016-09-29 DIAGNOSIS — H524 Presbyopia: Secondary | ICD-10-CM | POA: Diagnosis not present

## 2016-10-02 ENCOUNTER — Ambulatory Visit (INDEPENDENT_AMBULATORY_CARE_PROVIDER_SITE_OTHER): Payer: Medicare Other | Admitting: Unknown Physician Specialty

## 2016-10-02 ENCOUNTER — Encounter: Payer: Self-pay | Admitting: Unknown Physician Specialty

## 2016-10-02 VITALS — BP 132/79 | HR 78 | Temp 98.6°F | Ht 61.7 in | Wt 164.6 lb

## 2016-10-02 DIAGNOSIS — R7301 Impaired fasting glucose: Secondary | ICD-10-CM | POA: Diagnosis not present

## 2016-10-02 DIAGNOSIS — I10 Essential (primary) hypertension: Secondary | ICD-10-CM | POA: Diagnosis not present

## 2016-10-02 LAB — LIPID PANEL PICCOLO, WAIVED
Chol/HDL Ratio Piccolo,Waive: 2.9 mg/dL
Cholesterol Piccolo, Waived: 172 mg/dL (ref <200)
HDL CHOL PICCOLO, WAIVED: 59 mg/dL (ref >59)
LDL Chol Calc Piccolo Waived: 86 mg/dL (ref <100)
TRIGLYCERIDES PICCOLO,WAIVED: 136 mg/dL (ref <150)
VLDL Chol Calc Piccolo,Waive: 27 mg/dL (ref <30)

## 2016-10-02 LAB — MICROALBUMIN, URINE WAIVED
Creatinine, Urine Waived: 200 mg/dL (ref 10–300)
Microalb, Ur Waived: 30 mg/L — ABNORMAL HIGH (ref 0–19)

## 2016-10-02 LAB — BAYER DCA HB A1C WAIVED: HB A1C (BAYER DCA - WAIVED): 6 % (ref <7.0)

## 2016-10-02 MED ORDER — AMLODIPINE BESYLATE 5 MG PO TABS: 5.0000 mg | ORAL_TABLET | Freq: Every day | ORAL | 1 refills | Status: DC

## 2016-10-02 MED ORDER — LISINOPRIL-HYDROCHLOROTHIAZIDE 20-12.5 MG PO TABS: 1.0000 | ORAL_TABLET | Freq: Every day | ORAL | 1 refills | Status: DC

## 2016-10-02 NOTE — Assessment & Plan Note (Signed)
Stable, continue present medications.   

## 2016-10-02 NOTE — Assessment & Plan Note (Addendum)
Hgb A1C is 6.0% This has been improved.  Will continue present lifestyle changes

## 2016-10-02 NOTE — Progress Notes (Signed)
BP 132/79 (BP Location: Left Arm, Cuff Size: Normal)   Pulse 78   Temp 98.6 F (37 C)   Ht 5' 1.7" (1.567 m)   Wt 164 lb 9.6 oz (74.7 kg)   LMP 11/28/1994 (Approximate)   SpO2 98%   BMI 30.40 kg/m    Subjective:    Patient ID: Ellen [name], female    DOB: 03-17-1941, 76 y.o.   MRN: [id number]  HPI: Ellen [name] is a 76 y.o. female  Chief Complaint  Patient presents with  . Hypertension  . IFG   Diabetes: Using medications without difficulties No hypoglycemic episodes No hyperglycemic episodes Feet problems:none Blood Sugars averaging:Not checking eye exam within last year Last Hgb A1C: 6.5  Hypertension  Using medications without difficulty Average home BPs Not checking   Using medication without problems or lightheadedness No chest pain with exertion or shortness of breath No Edema  Elevated Cholesterol Using medications without problems No Muscle aches  Diet: Trying to eat well Exercise:calisthenics at home.      Relevant past medical, surgical, family and social history reviewed and updated as indicated. Interim medical history since our last visit reviewed. Allergies and medications reviewed and updated.  Review of Systems  Per HPI unless specifically indicated above     Objective:    BP 132/79 (BP Location: Left Arm, Cuff Size: Normal)   Pulse 78   Temp 98.6 F (37 C)   Ht 5' 1.7" (1.567 m)   Wt 164 lb 9.6 oz (74.7 kg)   LMP 11/28/1994 (Approximate)   SpO2 98%   BMI 30.40 kg/m   Wt Readings from Last 3 Encounters:  10/02/16 164 lb 9.6 oz (74.7 kg)  04/03/16 168 lb 6.4 oz (76.4 kg)  03/06/16 170 lb 12.8 oz (77.5 kg)    Physical Exam  Constitutional: She is oriented to person, place, and time. She appears well-developed and well-nourished. No distress.  HENT:  Head: Normocephalic and atraumatic.  Eyes: Conjunctivae and lids are normal. Right eye exhibits no discharge. Left eye exhibits no discharge. No scleral icterus.    Neck: Normal range of motion. Neck supple. No JVD present. Carotid bruit is not present.  Cardiovascular: Normal rate, regular rhythm and normal heart sounds.   Pulmonary/Chest: Effort normal and breath sounds normal.  Abdominal: Normal appearance. There is no splenomegaly or hepatomegaly.  Musculoskeletal: Normal range of motion.  Neurological: She is alert and oriented to person, place, and time.  Skin: Skin is warm, dry and intact. No rash noted. No pallor.  Psychiatric: She has a normal mood and affect. Her behavior is normal. Judgment and thought content normal.    Results for orders placed or performed in visit on 04/03/16  Comprehensive metabolic panel  Result Value Ref Range   Glucose 106 (H) 65 - 99 mg/dL   BUN 20 8 - 27 mg/dL   Creatinine, Ser 9.52 (H) 0.57 - 1.00 mg/dL   GFR calc non Af Amer 53 (L) >59 mL/min/1.73   GFR calc Af Amer 61 >59 mL/min/1.73   BUN/Creatinine Ratio 19 12 - 28   Sodium 143 134 - 144 mmol/L   Potassium 4.0 3.5 - 5.2 mmol/L   Chloride 101 96 - 106 mmol/L   CO2 26 18 - 29 mmol/L   Calcium 9.5 8.7 - 10.3 mg/dL   Total Protein 6.8 6.0 - 8.5 g/dL   Albumin 3.9 3.5 - 4.8 g/dL   Globulin, Total 2.9 1.5 - 4.5 g/dL   Albumin/Globulin  Ratio 1.3 1.2 - 2.2   Bilirubin Total 0.3 0.0 - 1.2 mg/dL   Alkaline Phosphatase 108 39 - 117 IU/L   AST 24 0 - 40 IU/L   ALT 15 0 - 32 IU/L  Bayer DCA Hb A1c Waived  Result Value Ref Range   Bayer DCA Hb A1c Waived 6.5 <7.0 %  Lipid Panel w/o Chol/HDL Ratio  Result Value Ref Range   Cholesterol, Total 180 100 - 199 mg/dL   Triglycerides 696 (H) 0 - 149 mg/dL   HDL 40 >29 mg/dL   VLDL Cholesterol Cal 45 (H) 5 - 40 mg/dL   LDL Calculated 95 0 - 99 mg/dL      Assessment & Plan:   Problem List Items Addressed This Visit      Unprioritized   Hypertension    Stable, continue present medications.        Relevant Medications   amLODipine (NORVASC) 5 MG tablet   lisinopril-hydrochlorothiazide  (PRINZIDE,ZESTORETIC) 20-12.5 MG tablet   Other Relevant Orders   Comprehensive metabolic panel   Lipid Panel Piccolo, Waived   IFG (impaired fasting glucose) - Primary    Hgb A1C is 6.0% This has been improved.  Will continue present lifestyle changes      Relevant Orders   Bayer DCA Hb A1c Waived   Microalbumin, Urine Waived       Follow up plan: Return in about 6 months (around 04/03/2017) for PE.

## 2016-10-03 ENCOUNTER — Encounter: Payer: Self-pay | Admitting: Unknown Physician Specialty

## 2016-10-03 LAB — COMPREHENSIVE METABOLIC PANEL
A/G RATIO: 1.6 (ref 1.2–2.2)
ALK PHOS: 102 IU/L (ref 39–117)
ALT: 11 IU/L (ref 0–32)
AST: 18 IU/L (ref 0–40)
Albumin: 4 g/dL (ref 3.5–4.8)
BUN/Creatinine Ratio: 19 (ref 12–28)
BUN: 15 mg/dL (ref 8–27)
Bilirubin Total: <0.2 mg/dL (ref 0.0–1.2)
CALCIUM: 9.7 mg/dL (ref 8.7–10.3)
CHLORIDE: 101 mmol/L (ref 96–106)
CO2: 28 mmol/L (ref 18–29)
Creatinine, Ser: 0.78 mg/dL (ref 0.57–1.00)
GFR calc Af Amer: 87 mL/min/{1.73_m2} (ref >59)
GFR, EST NON AFRICAN AMERICAN: 76 mL/min/{1.73_m2} (ref >59)
GLOBULIN, TOTAL: 2.5 g/dL (ref 1.5–4.5)
Glucose: 102 mg/dL — ABNORMAL HIGH (ref 65–99)
POTASSIUM: 3.7 mmol/L (ref 3.5–5.2)
SODIUM: 143 mmol/L (ref 134–144)
Total Protein: 6.5 g/dL (ref 6.0–8.5)

## 2016-12-13 ENCOUNTER — Ambulatory Visit (INDEPENDENT_AMBULATORY_CARE_PROVIDER_SITE_OTHER): Payer: Medicare Other | Admitting: Unknown Physician Specialty

## 2016-12-13 ENCOUNTER — Ambulatory Visit: Payer: Self-pay | Admitting: Unknown Physician Specialty

## 2016-12-13 ENCOUNTER — Encounter: Payer: Self-pay | Admitting: Unknown Physician Specialty

## 2016-12-13 VITALS — BP 167/91 | HR 90 | Temp 98.2°F | Wt 161.2 lb

## 2016-12-13 DIAGNOSIS — M62838 Other muscle spasm: Secondary | ICD-10-CM

## 2016-12-13 MED ORDER — MELOXICAM 15 MG PO TABS: 15.0000 mg | ORAL_TABLET | Freq: Every day | ORAL | 0 refills | Status: DC

## 2016-12-13 MED ORDER — KETOROLAC TROMETHAMINE 60 MG/2ML IM SOLN
60.0000 mg | Freq: Once | INTRAMUSCULAR | Status: AC
  Administered 2016-12-13: 60 mg via INTRAMUSCULAR

## 2016-12-13 MED ORDER — TIZANIDINE HCL 4 MG PO TABS: 4.0000 mg | ORAL_TABLET | Freq: Four times a day (QID) | ORAL | 0 refills | Status: DC | PRN

## 2016-12-13 NOTE — Progress Notes (Signed)
BP (!) 167/91   Pulse 90   Temp 98.2 F (36.8 C)   Wt 161 lb 3.2 oz (73.1 kg)   LMP 11/28/1994 (Approximate)   SpO2 98%   BMI 29.77 kg/m    Subjective:    Patient ID: Ellen [name], female    DOB: August 05, 1940, 76 y.o.   MRN: [id number]  HPI: Ellen [name] is a 76 y.o. female  Chief Complaint  Patient presents with  . Neck Pain    pt states that the muscle on the left side of her neck to her shoulder is hurting, states it has been since last Tuesday    Neck pain Pt describes over a week of severe right sided neck pain which radiates to shoulder.  This has happened before and went to the ER.  Pain has not changed.  She is not taking anything besides applying biofreeze.  Pain is sharp but not radiating to arm or hand.  No feverl    Relevant past medical, surgical, family and social history reviewed and updated as indicated. Interim medical history since our last visit reviewed. Allergies and medications reviewed and updated.  Review of Systems  Per HPI unless specifically indicated above     Objective:    BP (!) 167/91   Pulse 90   Temp 98.2 F (36.8 C)   Wt 161 lb 3.2 oz (73.1 kg)   LMP 11/28/1994 (Approximate)   SpO2 98%   BMI 29.77 kg/m   Wt Readings from Last 3 Encounters:  12/13/16 161 lb 3.2 oz (73.1 kg)  10/02/16 164 lb 9.6 oz (74.7 kg)  04/03/16 168 lb 6.4 oz (76.4 kg)    Physical Exam  Constitutional: She is oriented to person, place, and time. She appears well-developed and well-nourished. No distress.  HENT:  Head: Normocephalic and atraumatic.  Eyes: Conjunctivae and lids are normal. Right eye exhibits no discharge. Left eye exhibits no discharge. No scleral icterus.  Neck: Normal range of motion. Neck supple. No JVD present. Carotid bruit is not present.  Cardiovascular: Normal rate, regular rhythm and normal heart sounds.   Pulmonary/Chest: Effort normal and breath sounds normal.  Abdominal: Normal appearance. There is no splenomegaly or  hepatomegaly.  Musculoskeletal: Normal range of motion.  Neurological: She is alert and oriented to person, place, and time.  Skin: Skin is warm, dry and intact. No rash noted. No pallor.  Psychiatric: She has a normal mood and affect. Her behavior is normal. Judgment and thought content normal.    Results for orders placed or performed in visit on 10/02/16  Comprehensive metabolic panel  Result Value Ref Range   Glucose 102 (H) 65 - 99 mg/dL   BUN 15 8 - 27 mg/dL   Creatinine, Ser 4.090.78 0.57 - 1.00 mg/dL   GFR calc non Af Amer 76 >59 mL/min/1.73   GFR calc Af Amer 87 >59 mL/min/1.73   BUN/Creatinine Ratio 19 12 - 28   Sodium 143 134 - 144 mmol/L   Potassium 3.7 3.5 - 5.2 mmol/L   Chloride 101 96 - 106 mmol/L   CO2 28 18 - 29 mmol/L   Calcium 9.7 8.7 - 10.3 mg/dL   Total Protein 6.5 6.0 - 8.5 g/dL   Albumin 4.0 3.5 - 4.8 g/dL   Globulin, Total 2.5 1.5 - 4.5 g/dL   Albumin/Globulin Ratio 1.6 1.2 - 2.2   Bilirubin Total <0.2 0.0 - 1.2 mg/dL   Alkaline Phosphatase 102 39 - 117 IU/L   AST  18 0 - 40 IU/L   ALT 11 0 - 32 IU/L  Lipid Panel Piccolo, Waived  Result Value Ref Range   Cholesterol Piccolo, Waived 172 <200 mg/dL   HDL Chol Piccolo, Waived 59 >59 mg/dL   Triglycerides Piccolo,Waived 136 <150 mg/dL   Chol/HDL Ratio Piccolo,Waive 2.9 mg/dL   LDL Chol Calc Piccolo Waived 86 <100 mg/dL   VLDL Chol Calc Piccolo,Waive 27 <30 mg/dL  Bayer DCA Hb Z6X Waived  Result Value Ref Range   Bayer DCA Hb A1c Waived 6.0 <7.0 %  Microalbumin, Urine Waived  Result Value Ref Range   Microalb, Ur Waived 30 (H) 0 - 19 mg/L   Creatinine, Urine Waived 200 10 - 300 mg/dL   Microalb/Creat Ratio <30 <30 mg/g      Assessment & Plan:   Problem List Items Addressed This Visit    None    Visit Diagnoses    Spasm of cervical paraspinous muscle    -  Primary   Toradol in the office.  Mobic to start tomorrow.  Tizanidine prn.     Relevant Medications   ketorolac (TORADOL) injection 60 mg  (Completed)       Follow up plan: Return if symptoms worsen or fail to improve.

## 2017-03-07 ENCOUNTER — Telehealth: Payer: Self-pay | Admitting: Unknown Physician Specialty

## 2017-03-07 NOTE — Telephone Encounter (Signed)
Called pt to schedule for Annual Wellness Visit with Nurse Health Advisor, Tiffany Hill, my c/b # is [phone]  Kathryn Brown ° °

## 2017-04-06 ENCOUNTER — Telehealth: Payer: Self-pay | Admitting: Unknown Physician Specialty

## 2017-04-06 MED ORDER — AMLODIPINE BESYLATE 5 MG PO TABS: 5.0000 mg | ORAL_TABLET | Freq: Every day | ORAL | 1 refills | Status: DC

## 2017-04-06 MED ORDER — LISINOPRIL-HYDROCHLOROTHIAZIDE 20-12.5 MG PO TABS: 1.0000 | ORAL_TABLET | Freq: Every day | ORAL | 1 refills | Status: DC

## 2017-04-06 NOTE — Telephone Encounter (Signed)
Refills sent

## 2017-04-06 NOTE — Telephone Encounter (Signed)
Patient needs refills on the following she is out of the meds   Amlodipine  lisonopril   [facility]  Thank you

## 2017-04-06 NOTE — Telephone Encounter (Signed)
Routing to provider  

## 2017-04-13 ENCOUNTER — Ambulatory Visit (INDEPENDENT_AMBULATORY_CARE_PROVIDER_SITE_OTHER): Payer: Medicare Other

## 2017-04-13 VITALS — BP 138/76 | HR 89 | Temp 98.5°F | Resp 17 | Ht 62.0 in | Wt 161.7 lb

## 2017-04-13 DIAGNOSIS — Z23 Encounter for immunization: Secondary | ICD-10-CM | POA: Diagnosis not present

## 2017-04-13 DIAGNOSIS — Z0000 Encounter for general adult medical examination without abnormal findings: Secondary | ICD-10-CM

## 2017-04-13 NOTE — Patient Instructions (Addendum)
Ellen [name] , Thank you for taking time to come for your Medicare Wellness Visit. I appreciate your ongoing commitment to your health goals. Please review the following plan we discussed and let me know if I can assist you in the future.   Screening recommendations/referrals: Colonoscopy: completed 03/28/2013 Mammogram: completed 05/25/2016 Bone Density: completed 08/11/2013 Recommended yearly ophthalmology/optometry visit for glaucoma screening and checkup Recommended yearly dental visit for hygiene and checkup  Vaccinations: Influenza vaccine: due now Pneumococcal vaccine: up to date Tdap vaccine: up to date  Shingles vaccine: due, check with your insurance company for coverage  Advanced directives: Advance directive discussed with you today. I have provided a copy for you to complete at home and have notarized. Once this is complete please bring a copy in to our office so we can scan it into your chart.  Conditions/risks identified: Recommend drinking at least 5-6 glasses of water day   Next appointment: Follow up on 04/17/2017 at 8:00am with [name]. Follow up in one year for your annual wellness exam.    Preventive Care 65 Years and Older, Female Preventive care refers to lifestyle choices and visits with your health care provider that can promote health and wellness. What does preventive care include?  A yearly physical exam. This is also called an annual well check.  Dental exams once or twice a year.  Routine eye exams. Ask your health care provider how often you should have your eyes checked.  Personal lifestyle choices, including:  Daily care of your teeth and gums.  Regular physical activity.  Eating a healthy diet.  Avoiding tobacco and drug use.  Limiting alcohol use.  Practicing safe sex.  Taking low-dose aspirin every day.  Taking vitamin and mineral supplements as recommended by your health care provider. What happens during an annual well  check? The services and screenings done by your health care provider during your annual well check will depend on your age, overall health, lifestyle risk factors, and family history of disease. Counseling  Your health care provider may ask you questions about your:  Alcohol use.  Tobacco use.  Drug use.  Emotional well-being.  Home and relationship well-being.  Sexual activity.  Eating habits.  History of falls.  Memory and ability to understand (cognition).  Work and work Astronomer.  Reproductive health. Screening  You may have the following tests or measurements:  Height, weight, and BMI.  Blood pressure.  Lipid and cholesterol levels. These may be checked every 5 years, or more frequently if you are over 19 years old.  Skin check.  Lung cancer screening. You may have this screening every year starting at age 5 if you have a 30-pack-year history of smoking and currently smoke or have quit within the past 15 years.  Fecal occult blood test (FOBT) of the stool. You may have this test every year starting at age 19.  Flexible sigmoidoscopy or colonoscopy. You may have a sigmoidoscopy every 5 years or a colonoscopy every 10 years starting at age 30.  Hepatitis C blood test.  Hepatitis B blood test.  Sexually transmitted disease (STD) testing.  Diabetes screening. This is done by checking your blood sugar (glucose) after you have not eaten for a while (fasting). You may have this done every 1-3 years.  Bone density scan. This is done to screen for osteoporosis. You may have this done starting at age 64.  Mammogram. This may be done every 1-2 years. Talk to your health care provider about how  often you should have regular mammograms. Talk with your health care provider about your test results, treatment options, and if necessary, the need for more tests. Vaccines  Your health care provider may recommend certain vaccines, such as:  Influenza vaccine. This is  recommended every year.  Tetanus, diphtheria, and acellular pertussis (Tdap, Td) vaccine. You may need a Td booster every 10 years.  Zoster vaccine. You may need this after age 85.  Pneumococcal 13-valent conjugate (PCV13) vaccine. One dose is recommended after age 102.  Pneumococcal polysaccharide (PPSV23) vaccine. One dose is recommended after age 57. Talk to your health care provider about which screenings and vaccines you need and how often you need them. This information is not intended to replace advice given to you by your health care provider. Make sure you discuss any questions you have with your health care provider. Document Released: 07/02/2015 Document Revised: 02/23/2016 Document Reviewed: 04/06/2015 Elsevier Interactive Patient Education  2017 [place] Prevention in the Home Falls can cause injuries. They can happen to people of all ages. There are many things you can do to make your home safe and to help prevent falls. What can I do on the outside of my home?  Regularly fix the edges of walkways and driveways and fix any cracks.  Remove anything that might make you trip as you walk through a door, such as a raised step or threshold.  Trim any bushes or trees on the path to your home.  Use bright outdoor lighting.  Clear any walking paths of anything that might make someone trip, such as rocks or tools.  Regularly check to see if handrails are loose or broken. Make sure that both sides of any steps have handrails.  Any raised decks and porches should have guardrails on the edges.  Have any leaves, snow, or ice cleared regularly.  Use sand or salt on walking paths during winter.  Clean up any spills in your garage right away. This includes oil or grease spills. What can I do in the bathroom?  Use night lights.  Install grab bars by the toilet and in the tub and shower. Do not use towel bars as grab bars.  Use non-skid mats or decals in the tub or  shower.  If you need to sit down in the shower, use a plastic, non-slip stool.  Keep the floor dry. Clean up any water that spills on the floor as soon as it happens.  Remove soap buildup in the tub or shower regularly.  Attach bath mats securely with double-sided non-slip rug tape.  Do not have throw rugs and other things on the floor that can make you trip. What can I do in the bedroom?  Use night lights.  Make sure that you have a light by your bed that is easy to reach.  Do not use any sheets or blankets that are too big for your bed. They should not hang down onto the floor.  Have a firm chair that has side arms. You can use this for support while you get dressed.  Do not have throw rugs and other things on the floor that can make you trip. What can I do in the kitchen?  Clean up any spills right away.  Avoid walking on wet floors.  Keep items that you use a lot in easy-to-reach places.  If you need to reach something above you, use a strong step stool that has a grab bar.  Keep electrical  cords out of the way.  Do not use floor polish or wax that makes floors slippery. If you must use wax, use non-skid floor wax.  Do not have throw rugs and other things on the floor that can make you trip. What can I do with my stairs?  Do not leave any items on the stairs.  Make sure that there are handrails on both sides of the stairs and use them. Fix handrails that are broken or loose. Make sure that handrails are as long as the stairways.  Check any carpeting to make sure that it is firmly attached to the stairs. Fix any carpet that is loose or worn.  Avoid having throw rugs at the top or bottom of the stairs. If you do have throw rugs, attach them to the floor with carpet tape.  Make sure that you have a light switch at the top of the stairs and the bottom of the stairs. If you do not have them, ask someone to add them for you. What else can I do to help prevent  falls?  Wear shoes that:  Do not have high heels.  Have rubber bottoms.  Are comfortable and fit you well.  Are closed at the toe. Do not wear sandals.  If you use a stepladder:  Make sure that it is fully opened. Do not climb a closed stepladder.  Make sure that both sides of the stepladder are locked into place.  Ask someone to hold it for you, if possible.  Clearly mark and make sure that you can see:  Any grab bars or handrails.  First and last steps.  Where the edge of each step is.  Use tools that help you move around (mobility aids) if they are needed. These include:  Canes.  Walkers.  Scooters.  Crutches.  Turn on the lights when you go into a dark area. Replace any light bulbs as soon as they burn out.  Set up your furniture so you have a clear path. Avoid moving your furniture around.  If any of your floors are uneven, fix them.  If there are any pets around you, be aware of where they are.  Review your medicines with your doctor. Some medicines can make you feel dizzy. This can increase your chance of falling. Ask your doctor what other things that you can do to help prevent falls. This information is not intended to replace advice given to you by your health care provider. Make sure you discuss any questions you have with your health care provider. Document Released: 04/01/2009 Document Revised: 11/11/2015 Document Reviewed: 07/10/2014 Elsevier Interactive Patient Education  2017 Elsevier Inc[employer]  Influenza (Flu) Vaccine (Inactivated or Recombinant): What You Need to Know 1. Why get vaccinated? Influenza ("flu") is a contagious disease that spreads around the [place] every year, usually between October and May. Flu is caused by influenza viruses, and is spread mainly by coughing, sneezing, and close contact. Anyone can get flu. Flu strikes suddenly and can last several days. Symptoms vary by age, but can include:  fever/chills  sore  throat  muscle aches  fatigue  cough  headache  runny or stuffy nose  Flu can also lead to pneumonia and blood infections, and cause diarrhea and seizures in children. If you have a medical condition, such as heart or lung disease, flu can make it worse. Flu is more dangerous for some people. Infants and young children, people 10 years of age and older, pregnant women,  and people with certain health conditions or a weakened immune system are at greatest risk. Each year thousands of people in the [place] die from flu, and many more are hospitalized. Flu vaccine can:  keep you from getting flu,  make flu less severe if you do get it, and  keep you from spreading flu to your family and other people. 2. Inactivated and recombinant flu vaccines A dose of flu vaccine is recommended every flu season. Children 6 months through 12 years of age may need two doses during the same flu season. Everyone else needs only one dose each flu season. Some inactivated flu vaccines contain a very small amount of a mercury-based preservative called thimerosal. Studies have not shown thimerosal in vaccines to be harmful, but flu vaccines that do not contain thimerosal are available. There is no live flu virus in flu shots. They cannot cause the flu. There are many flu viruses, and they are always changing. Each year a new flu vaccine is made to protect against three or four viruses that are likely to cause disease in the upcoming flu season. But even when the vaccine doesn't exactly match these viruses, it may still provide some protection. Flu vaccine cannot prevent:  flu that is caused by a virus not covered by the vaccine, or  illnesses that look like flu but are not.  It takes about 2 weeks for protection to develop after vaccination, and protection lasts through the flu season. 3. Some people should not get this vaccine Tell the person who is giving you the vaccine:  If you have any severe,  life-threatening allergies. If you ever had a life-threatening allergic reaction after a dose of flu vaccine, or have a severe allergy to any part of this vaccine, you may be advised not to get vaccinated. Most, but not all, types of flu vaccine contain a small amount of egg protein.  If you ever had Guillain-Barr Syndrome (also called GBS). Some people with a history of GBS should not get this vaccine. This should be discussed with your doctor.  If you are not feeling well. It is usually okay to get flu vaccine when you have a mild illness, but you might be asked to come back when you feel better.  4. Risks of a vaccine reaction With any medicine, including vaccines, there is a chance of reactions. These are usually mild and go away on their own, but serious reactions are also possible. Most people who get a flu shot do not have any problems with it. Minor problems following a flu shot include:  soreness, redness, or swelling where the shot was given  hoarseness  sore, red or itchy eyes  cough  fever  aches  headache  itching  fatigue  If these problems occur, they usually begin soon after the shot and last 1 or 2 days. More serious problems following a flu shot can include the following:  There may be a small increased risk of Guillain-Barre Syndrome (GBS) after inactivated flu vaccine. This risk has been estimated at 1 or 2 additional cases per million people vaccinated. This is much lower than the risk of severe complications from flu, which can be prevented by flu vaccine.  Young children who get the flu shot along with pneumococcal vaccine (PCV13) and/or DTaP vaccine at the same time might be slightly more likely to have a seizure caused by fever. Ask your doctor for more information. Tell your doctor if a child who is getting  flu vaccine has ever had a seizure.  Problems that could happen after any injected vaccine:  People sometimes faint after a medical procedure,  including vaccination. Sitting or lying down for about 15 minutes can help prevent fainting, and injuries caused by a fall. Tell your doctor if you feel dizzy, or have vision changes or ringing in the ears.  Some people get severe pain in the shoulder and have difficulty moving the arm where a shot was given. This happens very rarely.  Any medication can cause a severe allergic reaction. Such reactions from a vaccine are very rare, estimated at about 1 in a million doses, and would happen within a few minutes to a few hours after the vaccination. As with any medicine, there is a very remote chance of a vaccine causing a serious injury or death. The safety of vaccines is always being monitored. For more information, visit: http://floyd.org/www.cdc.gov/vaccinesafety/ 5. What if there is a serious reaction? What should I look for? Look for anything that concerns you, such as signs of a severe allergic reaction, very high fever, or unusual behavior. Signs of a severe allergic reaction can include hives, swelling of the face and throat, difficulty breathing, a fast heartbeat, dizziness, and weakness. These would start a few minutes to a few hours after the vaccination. What should I do?  If you think it is a severe allergic reaction or other emergency that can't wait, call 9-1-1 and get the person to the nearest hospital. Otherwise, call your doctor.  Reactions should be reported to the Vaccine Adverse Event Reporting System (VAERS). Your doctor should file this report, or you can do it yourself through the VAERS web site at www.vaers.LAgents.nohhs.gov, or by calling 1-919 864 8433. ? VAERS does not give medical advice. 6. The National Vaccine Injury Compensation Program The [employer] Vaccine Injury Compensation Program (VICP) is a federal program that was created to compensate people who may have been injured by certain vaccines. Persons who believe they may have been injured by a vaccine can learn about the program and about  filing a claim by calling 1-414-541-5914 or visiting the VICP website at SpiritualWord.atwww.hrsa.gov/vaccinecompensation. There is a time limit to file a claim for compensation. 7. How can I learn more?  Ask your healthcare provider. He or she can give you the vaccine package insert or suggest other sources of information.  Call your local or state health department.  Contact the Centers for Disease Control and Prevention (CDC): ? Call 419-762-78111-442-293-3311 (1-800-CDC-INFO) or ? Visit CDC's website at BiotechRoom.com.cywww.cdc.gov/flu Vaccine Information Statement, Inactivated Influenza Vaccine (01/23/2014) This information is not intended to replace advice given to you by your health care provider. Make sure you discuss any questions you have with your health care provider. Document Released: 03/30/2006 Document Revised: 02/24/2016 Document Reviewed: 02/24/2016 Elsevier Interactive Patient Education  2017 [employer]

## 2017-04-13 NOTE — Progress Notes (Signed)
Subjective:   Ellen [name] is a 76 y.o. female who presents for Medicare Annual (Subsequent) preventive examination.  Review of Systems:   Cardiac Risk Factors include: advanced age (>60men, >20 women);hypertension     Objective:     Vitals: BP 138/76 (BP Location: Left Arm, Cuff Size: Normal)   Pulse 89   Temp 98.5 F (36.9 C) (Oral)   Resp 17   Ht 5\' 2"  (1.575 m)   Wt 161 lb 11.2 oz (73.3 kg)   LMP 11/28/1994 (Approximate)   BMI 29.58 kg/m   Body mass index is 29.58 kg/m.   Tobacco History  Smoking Status  . Never Smoker  Smokeless Tobacco  . Never Used     Counseling given: Not Answered   Past Medical History:  Diagnosis Date  . Hypertension   . Lumbago   . Menopause   . Migraines    Past Surgical History:  Procedure Laterality Date  . CHOLECYSTECTOMY    . TUBAL LIGATION     Family History  Problem Relation Age of Onset  . Alzheimer's disease Mother   . Diabetes Mother   . Stroke Mother   . Hyperlipidemia Mother   . Stroke Father   . Hypertension Father   . Hyperlipidemia Father   . Diabetes Sister   . Diabetes Brother   . Hypertension Brother   . Diabetes Sister    History  Sexual Activity  . Sexual activity: Yes    Outpatient Encounter Prescriptions as of 04/13/2017  Medication Sig  . amLODipine (NORVASC) 5 MG tablet Take 1 tablet (5 mg total) by mouth daily.  Marland Kitchen aspirin 81 MG tablet Take 81 mg by mouth daily.  . cholecalciferol (VITAMIN D) 400 UNITS TABS tablet Take 400 Units by mouth daily.  Marland Kitchen lisinopril-hydrochlorothiazide (PRINZIDE,ZESTORETIC) 20-12.5 MG tablet Take 1 tablet by mouth daily.  . Magnesium 250 MG TABS Take 250 mg by mouth daily.  Marland Kitchen UNABLE TO FIND Med Name: Omega XL- 2 capsules daily  . [DISCONTINUED] meloxicam (MOBIC) 15 MG tablet Take 1 tablet (15 mg total) by mouth daily. (Patient not taking: Reported on 04/13/2017)  . [DISCONTINUED] tiZANidine (ZANAFLEX) 4 MG tablet Take 1 tablet (4 mg total) by mouth every 6  (six) hours as needed for muscle spasms. (Patient not taking: Reported on 04/13/2017)   No facility-administered encounter medications on file as of 04/13/2017.     Activities of Daily Living In your present state of health, do you have any difficulty performing the following activities: 04/13/2017  Hearing? N  Vision? N  Difficulty concentrating or making decisions? N  Walking or climbing stairs? N  Dressing or bathing? N  Doing errands, shopping? N  Preparing Food and eating ? N  Using the Toilet? N  In the past six months, have you accidently leaked urine? N  Do you have problems with loss of bowel control? N  Managing your Medications? N  Managing your Finances? N  Housekeeping or managing your Housekeeping? N  Some recent data might be hidden    Patient Care Team: Gabriel Cirri, NP as PCP - General (Nurse Practitioner)    Assessment:     Exercise Activities and Dietary recommendations Current Exercise Habits: Home exercise routine, Type of exercise: walking, Time (Minutes): 30, Frequency (Times/Week): 3, Weekly Exercise (Minutes/Week): 90, Intensity: Mild, Exercise limited by: None identified  Goals    . Increase water intake          Recommend drinking at least 5-6 glasses  of water day       Fall Risk Fall Risk  04/13/2017 04/03/2016 01/28/2016 03/30/2015  Falls in the past year? No Yes Yes No  Number falls in past yr: - 1 1 -  Injury with Fall? - No No -   Depression Screen PHQ 2/9 Scores 04/13/2017 04/03/2016 03/30/2015  PHQ - 2 Score 0 0 0  PHQ- 9 Score 0 0 -     Cognitive Function     6CIT Screen 04/13/2017  What Year? 0 points  What month? 0 points  What time? 0 points  Count back from 20 0 points  Months in reverse 0 points  Repeat phrase 0 points  Total Score 0    Immunization History  Administered Date(s) Administered  . Influenza, High Dose Seasonal PF 03/06/2016, 04/13/2017  . Influenza,inj,Quad PF,6+ Mos 03/30/2015  . Pneumococcal  Conjugate-13 02/02/2014  . Pneumococcal Polysaccharide-23 01/27/2013  . Td 07/21/2008   Screening Tests Health Maintenance  Topic Date Due  . MAMMOGRAM  05/24/2018  . TETANUS/TDAP  07/21/2018  . COLONOSCOPY  03/25/2023  . INFLUENZA VACCINE  Completed  . DEXA SCAN  Completed  . PNA vac Low Risk Adult  Completed      Plan:     I have personally reviewed and addressed the Medicare Annual Wellness questionnaire and have noted the following in the patient's chart:  A. Medical and social history B. Use of alcohol, tobacco or illicit drugs  C. Current medications and supplements D. Functional ability and status E.  Nutritional status F.  Physical activity G. Advance directives H. List of other physicians I.  Hospitalizations, surgeries, and ER visits in previous 12 months J.  Vitals K. Screenings such as hearing and vision if needed, cognitive and depression L. Referrals and appointments   In addition, I have reviewed and discussed with patient certain preventive protocols, quality metrics, and best practice recommendations. A written personalized care plan for preventive services as well as general preventive health recommendations were provided to patient.   Signed,  Marin Robertsiffany Hill, LPN Nurse Health Advisor   MD Recommendations: none

## 2017-04-17 ENCOUNTER — Ambulatory Visit (INDEPENDENT_AMBULATORY_CARE_PROVIDER_SITE_OTHER): Payer: Medicare Other | Admitting: Unknown Physician Specialty

## 2017-04-17 ENCOUNTER — Encounter: Payer: Self-pay | Admitting: Unknown Physician Specialty

## 2017-04-17 VITALS — BP 157/82 | HR 75 | Temp 97.1°F | Ht 62.0 in | Wt 160.2 lb

## 2017-04-17 DIAGNOSIS — I129 Hypertensive chronic kidney disease with stage 1 through stage 4 chronic kidney disease, or unspecified chronic kidney disease: Secondary | ICD-10-CM

## 2017-04-17 DIAGNOSIS — Z7189 Other specified counseling: Secondary | ICD-10-CM | POA: Diagnosis not present

## 2017-04-17 DIAGNOSIS — I10 Essential (primary) hypertension: Secondary | ICD-10-CM | POA: Diagnosis not present

## 2017-04-17 DIAGNOSIS — Z0000 Encounter for general adult medical examination without abnormal findings: Secondary | ICD-10-CM

## 2017-04-17 DIAGNOSIS — R7301 Impaired fasting glucose: Secondary | ICD-10-CM

## 2017-04-17 MED ORDER — LISINOPRIL-HYDROCHLOROTHIAZIDE 20-25 MG PO TABS: 1.0000 | ORAL_TABLET | Freq: Every day | ORAL | 3 refills | Status: DC

## 2017-04-17 MED ORDER — AMLODIPINE BESYLATE 5 MG PO TABS: 5.0000 mg | ORAL_TABLET | Freq: Every day | ORAL | 1 refills | Status: DC

## 2017-04-17 NOTE — Assessment & Plan Note (Signed)
No to goal.  Increase Lisinopril to 20-25.  Continue amlodipine

## 2017-04-17 NOTE — Patient Instructions (Addendum)
Preventive Care 74 Years and Older, Female Preventive care refers to lifestyle choices and visits with your health care provider that can promote health and wellness. What does preventive care include?  A yearly physical exam. This is also called an annual well check.  Dental exams once or twice a year.  Routine eye exams. Ask your health care provider how often you should have your eyes checked.  Personal lifestyle choices, including: ? Daily care of your teeth and gums. ? Regular physical activity. ? Eating a healthy diet. ? Avoiding tobacco and drug use. ? Limiting alcohol use. ? Practicing safe sex. ? Taking low-dose aspirin every day. ? Taking vitamin and mineral supplements as recommended by your health care provider. What happens during an annual well check? The services and screenings done by your health care provider during your annual well check will depend on your age, overall health, lifestyle risk factors, and family history of disease. Counseling Your health care provider may ask you questions about your:  Alcohol use.  Tobacco use.  Drug use.  Emotional well-being.  Home and relationship well-being.  Sexual activity.  Eating habits.  History of falls.  Memory and ability to understand (cognition).  Work and work environment.  Reproductive health.  Screening You may have the following tests or measurements:  Height, weight, and BMI.  Blood pressure.  Lipid and cholesterol levels. These may be checked every 5 years, or more frequently if you are over 50 years old.  Skin check.  Lung cancer screening. You may have this screening every year starting at age 74 if you have a 30-pack-year history of smoking and currently smoke or have quit within the past 15 years.  Fecal occult blood test (FOBT) of the stool. You may have this test every year starting at age 74.  Flexible sigmoidoscopy or colonoscopy. You may have a sigmoidoscopy every 5 years or  a colonoscopy every 10 years starting at age 74.  Hepatitis C blood test.  Hepatitis B blood test.  Sexually transmitted disease (STD) testing.  Diabetes screening. This is done by checking your blood sugar (glucose) after you have not eaten for a while (fasting). You may have this done every 1-3 years.  Bone density scan. This is done to screen for osteoporosis. You may have this done starting at age 74.  Mammogram. This may be done every 1-2 years. Talk to your health care provider about how often you should have regular mammograms.  Talk with your health care provider about your test results, treatment options, and if necessary, the need for more tests. Vaccines Your health care provider may recommend certain vaccines, such as:  Influenza vaccine. This is recommended every year.  Tetanus, diphtheria, and acellular pertussis (Tdap, Td) vaccine. You may need a Td booster every 10 years.  Varicella vaccine. You may need this if you have not been vaccinated.  Zoster vaccine. You may need this after age 74.  Measles, mumps, and rubella (MMR) vaccine. You may need at least one dose of MMR if you were born in 1957 or later. You may also need a second dose.  Pneumococcal 13-valent conjugate (PCV13) vaccine. One dose is recommended after age 74.  Pneumococcal polysaccharide (PPSV23) vaccine. One dose is recommended after age 74.  Meningococcal vaccine. You may need this if you have certain conditions.  Hepatitis A vaccine. You may need this if you have certain conditions or if you travel or work in places where you may be exposed to hepatitis   A.  Hepatitis B vaccine. You may need this if you have certain conditions or if you travel or work in places where you may be exposed to hepatitis B.  Haemophilus influenzae type b (Hib) vaccine. You may need this if you have certain conditions.  Talk to your health care provider about which screenings and vaccines you need and how often you  need them. This information is not intended to replace advice given to you by your health care provider. Make sure you discuss any questions you have with your health care provider. Document Released: 07/02/2015 Document Revised: 02/23/2016 Document Reviewed: 04/06/2015 Elsevier Interactive Patient Education  2017 [employer].

## 2017-04-17 NOTE — Assessment & Plan Note (Signed)
A voluntary discussion about advance care planning including the explanation and discussion of advance directives was extensively discussed  with the patient.  Explanation about the health care proxy and Living will was reviewed and packet with forms with explanation of how to fill them out was given.  During this discussion, the patient was able to identify a health care proxy as her husband and plans to fill out the paperwork required.  Patient was offered a separate Advance Care Planning visit for further assistance with forms.    

## 2017-04-17 NOTE — Progress Notes (Signed)
BP (!) 157/82   Pulse 75   Temp (!) 97.1 F (36.2 C)   Ht 5\' 2"  (1.575 m)   Wt 160 lb 3.2 oz (72.7 kg)   LMP 11/28/1994 (Approximate)   SpO2 98%   BMI 29.30 kg/m    Subjective:    Patient ID: Ellen [name], female    DOB: 09/06/1940, 76 y.o.   MRN: [id number]  HPI: Ellen [name] is a 76 y.o. female  Chief Complaint  Patient presents with  . Annual Exam    pt had wellness exam with [facility] 04/13/17   Hypertension Using medications without difficulty Average home BPs Not checking   No problems or lightheadedness but complains of a mild headache this AM No chest pain with exertion or shortness of breath No Edema   Social History   Social History  . Marital status: Married    Spouse name: N/A  . Number of children: N/A  . Years of education: N/A   Occupational History  . Not on file.   Social History Main Topics  . Smoking status: Never Smoker  . Smokeless tobacco: Never Used  . Alcohol use No  . Drug use: No  . Sexual activity: Yes   Other Topics Concern  . Not on file   Social History Narrative  . No narrative on file   Family History  Problem Relation Age of Onset  . Alzheimer's disease Mother   . Diabetes Mother   . Stroke Mother   . Hyperlipidemia Mother   . Stroke Father   . Hypertension Father   . Hyperlipidemia Father   . Diabetes Sister   . Diabetes Brother   . Hypertension Brother   . Diabetes Sister    Past Medical History:  Diagnosis Date  . Hypertension   . Lumbago   . Menopause   . Migraines    Past Surgical History:  Procedure Laterality Date  . CHOLECYSTECTOMY    . TUBAL LIGATION      Relevant past medical, surgical, family and social history reviewed and updated as indicated. Interim medical history since our last visit reviewed. Allergies and medications reviewed and updated.  Review of Systems  Constitutional: Negative.   HENT: Negative.   Eyes: Negative.   Respiratory: Negative.   Cardiovascular:  Negative.   Gastrointestinal: Negative.   Endocrine: Negative.   Genitourinary: Negative.   Musculoskeletal: Negative.   Skin: Negative.   Allergic/Immunologic: Negative.   Neurological: Negative.   Hematological: Negative.   Psychiatric/Behavioral: Negative.     Per HPI unless specifically indicated above     Objective:    BP (!) 157/82   Pulse 75   Temp (!) 97.1 F (36.2 C)   Ht 5\' 2"  (1.575 m)   Wt 160 lb 3.2 oz (72.7 kg)   LMP 11/28/1994 (Approximate)   SpO2 98%   BMI 29.30 kg/m   Wt Readings from Last 3 Encounters:  04/17/17 160 lb 3.2 oz (72.7 kg)  04/13/17 161 lb 11.2 oz (73.3 kg)  12/13/16 161 lb 3.2 oz (73.1 kg)    Physical Exam  Constitutional: She is oriented to person, place, and time. She appears well-developed and well-nourished.  HENT:  Head: Normocephalic and atraumatic.  Eyes: Pupils are equal, round, and reactive to light. Right eye exhibits no discharge. Left eye exhibits no discharge. No scleral icterus.  Neck: Normal range of motion. Neck supple. Carotid bruit is not present. No thyromegaly present.  Cardiovascular: Normal rate, regular rhythm and  normal heart sounds.  Exam reveals no gallop and no friction rub.   No murmur heard. Pulmonary/Chest: Effort normal and breath sounds normal. No respiratory distress. She has no wheezes. She has no rales.  Abdominal: Soft. Bowel sounds are normal. There is no tenderness. There is no rebound.  Genitourinary: No breast swelling, tenderness or discharge.  Musculoskeletal: Normal range of motion.  Lymphadenopathy:    She has no cervical adenopathy.  Neurological: She is alert and oriented to person, place, and time.  Skin: Skin is warm, dry and intact. No rash noted.  Psychiatric: She has a normal mood and affect. Her speech is normal and behavior is normal. Judgment and thought content normal. Cognition and memory are normal.    Results for orders placed or performed in visit on 10/02/16  Comprehensive  metabolic panel  Result Value Ref Range   Glucose 102 (H) 65 - 99 mg/dL   BUN 15 8 - 27 mg/dL   Creatinine, Ser 1.610.78 0.57 - 1.00 mg/dL   GFR calc non Af Amer 76 >59 mL/min/1.73   GFR calc Af Amer 87 >59 mL/min/1.73   BUN/Creatinine Ratio 19 12 - 28   Sodium 143 134 - 144 mmol/L   Potassium 3.7 3.5 - 5.2 mmol/L   Chloride 101 96 - 106 mmol/L   CO2 28 18 - 29 mmol/L   Calcium 9.7 8.7 - 10.3 mg/dL   Total Protein 6.5 6.0 - 8.5 g/dL   Albumin 4.0 3.5 - 4.8 g/dL   Globulin, Total 2.5 1.5 - 4.5 g/dL   Albumin/Globulin Ratio 1.6 1.2 - 2.2   Bilirubin Total <0.2 0.0 - 1.2 mg/dL   Alkaline Phosphatase 102 39 - 117 IU/L   AST 18 0 - 40 IU/L   ALT 11 0 - 32 IU/L  Lipid Panel Piccolo, Waived  Result Value Ref Range   Cholesterol Piccolo, Waived 172 <200 mg/dL   HDL Chol Piccolo, Waived 59 >59 mg/dL   Triglycerides Piccolo,Waived 136 <150 mg/dL   Chol/HDL Ratio Piccolo,Waive 2.9 mg/dL   LDL Chol Calc Piccolo Waived 86 <100 mg/dL   VLDL Chol Calc Piccolo,Waive 27 <30 mg/dL  Bayer DCA Hb W9UA1c Waived  Result Value Ref Range   Bayer DCA Hb A1c Waived 6.0 <7.0 %  Microalbumin, Urine Waived  Result Value Ref Range   Microalb, Ur Waived 30 (H) 0 - 19 mg/L   Creatinine, Urine Waived 200 10 - 300 mg/dL   Microalb/Creat Ratio <30 <30 mg/g      Assessment & Plan:   Problem List Items Addressed This Visit      Unprioritized   Advanced care planning/counseling discussion    A voluntary discussion about advance care planning including the explanation and discussion of advance directives was extensively discussed  with the patient.  Explanation about the health care proxy and Living will was reviewed and packet with forms with explanation of how to fill them out was given.  During this discussion, the patient was able to identify a health care proxy as her husband  and plans to fill out the paperwork required.  Patient was offered a separate Advance Care Planning visit for further assistance with  forms.         Hypertension    No to goal.  Increase Lisinopril to 20-25.  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

## 2017-04-18 ENCOUNTER — Encounter: Payer: Self-pay | Admitting: Unknown Physician Specialty

## 2017-04-18 LAB — COMPREHENSIVE METABOLIC PANEL
A/G RATIO: 1.5 (ref 1.2–2.2)
ALT: 17 IU/L (ref 0–32)
AST: 20 IU/L (ref 0–40)
Albumin: 4.3 g/dL (ref 3.5–4.8)
Alkaline Phosphatase: 107 IU/L (ref 39–117)
BUN / CREAT RATIO: 15 (ref 12–28)
BUN: 11 mg/dL (ref 8–27)
CALCIUM: 9.7 mg/dL (ref 8.7–10.3)
CO2: 29 mmol/L (ref 20–29)
CREATININE: 0.73 mg/dL (ref 0.57–1.00)
Chloride: 102 mmol/L (ref 96–106)
GFR calc non Af Amer: 82 mL/min/{1.73_m2} (ref >59)
GFR, EST AFRICAN AMERICAN: 94 mL/min/{1.73_m2} (ref >59)
GLUCOSE: 101 mg/dL — AB (ref 65–99)
Globulin, Total: 2.9 g/dL (ref 1.5–4.5)
Potassium: 3.8 mmol/L (ref 3.5–5.2)
Sodium: 144 mmol/L (ref 134–144)
Total Protein: 7.2 g/dL (ref 6.0–8.5)

## 2017-04-18 LAB — LIPID PANEL W/O CHOL/HDL RATIO
Cholesterol, Total: 174 mg/dL (ref 100–199)
HDL: 53 mg/dL (ref >39)
LDL CALC: 97 mg/dL (ref 0–99)
Triglycerides: 121 mg/dL (ref 0–149)
VLDL CHOLESTEROL CAL: 24 mg/dL (ref 5–40)

## 2017-04-18 LAB — HGB A1C W/O EAG: HEMOGLOBIN A1C: 6.2 % — AB (ref 4.8–5.6)

## 2017-04-19 LAB — UA/M W/RFLX CULTURE, ROUTINE
BILIRUBIN UA: NEGATIVE
Glucose, UA: NEGATIVE
KETONES UA: NEGATIVE
NITRITE UA: NEGATIVE
Protein, UA: NEGATIVE
RBC UA: NEGATIVE
SPEC GRAV UA: 1.01 (ref 1.005–1.030)
UUROB: 0.2 mg/dL (ref 0.2–1.0)
pH, UA: 7 (ref 5.0–7.5)

## 2017-04-19 LAB — MICROSCOPIC EXAMINATION: Bacteria, UA: NONE SEEN

## 2017-05-16 ENCOUNTER — Ambulatory Visit (INDEPENDENT_AMBULATORY_CARE_PROVIDER_SITE_OTHER): Payer: Medicare Other | Admitting: Unknown Physician Specialty

## 2017-05-16 ENCOUNTER — Encounter: Payer: Self-pay | Admitting: Unknown Physician Specialty

## 2017-05-16 DIAGNOSIS — I10 Essential (primary) hypertension: Secondary | ICD-10-CM | POA: Diagnosis not present

## 2017-05-16 DIAGNOSIS — M791 Myalgia, unspecified site: Secondary | ICD-10-CM | POA: Diagnosis not present

## 2017-05-16 NOTE — Assessment & Plan Note (Addendum)
Stable, continue present medications. Has refills

## 2017-05-16 NOTE — Assessment & Plan Note (Signed)
New problem.  Discussed with pt that OTC Tylenol would be safest as long as occasional use.  May try other NSAIDs as kidney function is fine.  Encouraged activity

## 2017-05-16 NOTE — Progress Notes (Signed)
   BP 133/80 (BP Location: Left Arm, Cuff Size: Normal)   Pulse 89   Temp 98.2 F (36.8 C) (Oral)   Wt 158 lb (71.7 kg)   LMP 11/28/1994 (Approximate)   SpO2 94%   BMI 28.90 kg/m    Subjective:    Patient ID: Ellen [name], female    DOB: May 30, 1941, 76 y.o.   MRN: [id number]  HPI: Ellen FieldMary E Rozenberg is a 76 y.o. female  Chief Complaint  Patient presents with  . Hypertension    4 week f/up    Hypertension Using medications without difficulty Average home BPs typically 120s/70s   No problems or lightheadedness No chest pain with exertion or shortness of breath No Edema  Muscle stiffness Pt states she gets stiff in the AM and wonders about Ibuprofen prescription.  States this is not a significant problem and the stiffness goes away after moving around.  No fever.  Problem off and on for a while.    Relevant past medical, surgical, family and social history reviewed and updated as indicated. Interim medical history since our last visit reviewed. Allergies and medications reviewed and updated.  Review of Systems  Constitutional: Negative.   Respiratory: Negative.   Cardiovascular: Negative.   Gastrointestinal: Negative.    Per HPI unless specifically indicated above     Objective:    BP 133/80 (BP Location: Left Arm, Cuff Size: Normal)   Pulse 89   Temp 98.2 F (36.8 C) (Oral)   Wt 158 lb (71.7 kg)   LMP 11/28/1994 (Approximate)   SpO2 94%   BMI 28.90 kg/m   Wt Readings from Last 3 Encounters:  05/16/17 158 lb (71.7 kg)  04/17/17 160 lb 3.2 oz (72.7 kg)  04/13/17 161 lb 11.2 oz (73.3 kg)    Physical Exam  Constitutional: She is oriented to person, place, and time. She appears well-developed and well-nourished. No distress.  HENT:  Head: Normocephalic and atraumatic.  Eyes: Conjunctivae and lids are normal. Right eye exhibits no discharge. Left eye exhibits no discharge. No scleral icterus.  Neck: Normal range of motion. Neck supple. No JVD present.  Carotid bruit is not present.  Cardiovascular: Normal rate, regular rhythm and normal heart sounds.  Pulmonary/Chest: Effort normal and breath sounds normal.  Abdominal: Normal appearance. There is no splenomegaly or hepatomegaly.  Musculoskeletal: Normal range of motion.  Neurological: She is alert and oriented to person, place, and time.  Skin: Skin is warm, dry and intact. No rash noted. No pallor.  Psychiatric: She has a normal mood and affect. Her behavior is normal. Judgment and thought content normal.     Assessment & Plan:   Problem List Items Addressed This Visit      Unprioritized   Hypertension    Stable, continue present medications. Has refills       Muscle soreness    New problem.  Discussed with pt that OTC Tylenol would be safest as long as occasional use.  May try other NSAIDs as kidney function is fine.  Encouraged activity          Follow up plan: Return in about 6 months (around 11/13/2017).

## 2017-06-09 DIAGNOSIS — M546 Pain in thoracic spine: Secondary | ICD-10-CM | POA: Diagnosis not present

## 2017-06-09 DIAGNOSIS — I129 Hypertensive chronic kidney disease with stage 1 through stage 4 chronic kidney disease, or unspecified chronic kidney disease: Secondary | ICD-10-CM | POA: Diagnosis not present

## 2017-06-09 DIAGNOSIS — N189 Chronic kidney disease, unspecified: Secondary | ICD-10-CM | POA: Diagnosis not present

## 2017-06-09 DIAGNOSIS — R000 Tachycardia, unspecified: Secondary | ICD-10-CM | POA: Diagnosis not present

## 2017-06-13 ENCOUNTER — Ambulatory Visit (INDEPENDENT_AMBULATORY_CARE_PROVIDER_SITE_OTHER): Payer: Medicare Other | Admitting: Unknown Physician Specialty

## 2017-06-13 ENCOUNTER — Telehealth: Payer: Self-pay | Admitting: Unknown Physician Specialty

## 2017-06-13 ENCOUNTER — Encounter: Payer: Self-pay | Admitting: Unknown Physician Specialty

## 2017-06-13 DIAGNOSIS — M549 Dorsalgia, unspecified: Secondary | ICD-10-CM

## 2017-06-13 MED ORDER — MELOXICAM 15 MG PO TABS: 15.0000 mg | ORAL_TABLET | Freq: Every day | ORAL | 0 refills | Status: DC

## 2017-06-13 MED ORDER — BACLOFEN 20 MG PO TABS: 20.0000 mg | ORAL_TABLET | Freq: Three times a day (TID) | ORAL | 0 refills | Status: DC

## 2017-06-13 NOTE — Assessment & Plan Note (Addendum)
Severe pain much like she had in the past when she went to the pain clinic.  Will refer again to Dr. Pernell DupreAdams.  Rx for Baclofen, Naprosyn.  Discussed heavy purse purse she is carrying.  Needs a urine but unable to go right now. Will RTC if no relief.  Chest x-ray

## 2017-06-13 NOTE — Telephone Encounter (Signed)
ERROR pt was seen today by Gabriel Cirriheryl Wicker

## 2017-06-13 NOTE — Progress Notes (Signed)
BP (!) 141/79   Pulse 91   Wt 159 lb 6.4 oz (72.3 kg)   LMP 11/28/1994 (Approximate)   SpO2 98%   BMI 29.15 kg/m    Subjective:    Patient ID: Ellen [name], female    DOB: December 25, 1940, 76 y.o.   MRN: [id number]  HPI: Ellen FieldMary E Eagleson is a 76 y.o. female  Chief Complaint  Patient presents with  . Back Spasm    Pt presents  for upper right sided back pain when she woke up in the AM 4 days ago.  Last night she couldn't stand it anymore and went to the ER.  No injury or trauma. No urinary symptoms, abdominal pain. Used a pain cream without relief. Given Valium at the ER which helps her sleep but still having pain.  ER notes were reviewed for above information.  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    Relevant past medical, surgical, family and social history reviewed and updated as indicated. Interim medical history since our last visit reviewed. Allergies and medications reviewed and updated.  Review of Systems  Constitutional: Negative.   HENT: Negative.   Respiratory: Negative.   Genitourinary: Negative.   Psychiatric/Behavioral: Negative.     Per HPI unless specifically indicated above     Objective:    BP (!) 141/79   Pulse 91   Wt 159 lb 6.4 oz (72.3 kg)   LMP 11/28/1994 (Approximate)   SpO2 98%   BMI 29.15 kg/m   Wt Readings from Last 3 Encounters:  06/13/17 159 lb 6.4 oz (72.3 kg)  05/16/17 158 lb (71.7 kg)  04/17/17 160 lb 3.2 oz (72.7 kg)    Physical Exam  Constitutional: She is oriented to person, place, and time. She appears well-developed and well-nourished. No distress.  HENT:  Head: Normocephalic and atraumatic.  Eyes: Conjunctivae and lids are normal. Right eye exhibits no discharge. Left eye exhibits no discharge. No scleral icterus.  Neck: Normal range of motion. Neck supple. No JVD present. Carotid bruit is not present.    Cardiovascular: Normal rate, regular rhythm and normal heart sounds.  Pulmonary/Chest: Effort normal and breath sounds normal.  Abdominal: Normal appearance. There is no splenomegaly or hepatomegaly.  Musculoskeletal: Normal range of motion.  Right mid back very sore to touch  Neurological: She is alert and oriented to person, place, and time.  Skin: Skin is warm, dry and intact. No rash noted. No pallor.  Psychiatric: She has a normal mood and affect. Her behavior is normal. Judgment and thought content normal.    Results for orders placed or performed in visit on 04/17/17  Microscopic Examination  Result Value Ref Range   WBC, UA 0-5 0 - 5 /hpf   RBC, UA 0-2 0 - 2 /hpf   Epithelial Cells (non renal) 0-10 0 - 10 /hpf   Renal Epithel, UA 0-10 (A) None seen /hpf   Bacteria, UA None seen None seen/Few  Comprehensive metabolic panel  Result Value Ref Range   Glucose 101 (H) 65 - 99 mg/dL   BUN 11 8 - 27 mg/dL   Creatinine, Ser 8.650.73 0.57 - 1.00 mg/dL   GFR calc non Af Amer 82 >59 mL/min/1.73   GFR calc Af Amer 94 >59 mL/min/1.73   BUN/Creatinine Ratio 15 12 - 28   Sodium 144 134 -  144 mmol/L   Potassium 3.8 3.5 - 5.2 mmol/L   Chloride 102 96 - 106 mmol/L   CO2 29 20 - 29 mmol/L   Calcium 9.7 8.7 - 10.3 mg/dL   Total Protein 7.2 6.0 - 8.5 g/dL   Albumin 4.3 3.5 - 4.8 g/dL   Globulin, Total 2.9 1.5 - 4.5 g/dL   Albumin/Globulin Ratio 1.5 1.2 - 2.2   Bilirubin Total <0.2 0.0 - 1.2 mg/dL   Alkaline Phosphatase 107 39 - 117 IU/L   AST 20 0 - 40 IU/L   ALT 17 0 - 32 IU/L  Lipid Panel w/o Chol/HDL Ratio  Result Value Ref Range   Cholesterol, Total 174 100 - 199 mg/dL   Triglycerides 621121 0 - 149 mg/dL   HDL 53 >30>39 mg/dL   VLDL Cholesterol Cal 24 5 - 40 mg/dL   LDL Calculated 97 0 - 99 mg/dL  UA/M w/rflx Culture, Routine  Result Value Ref Range   Specific Gravity, UA 1.010 1.005 - 1.030   pH, UA 7.0 5.0 - 7.5   Color, UA Straw Yellow   Appearance Ur Clear Clear   Leukocytes,  UA Trace (A) Negative   Protein, UA Negative Negative/Trace   Glucose, UA Negative Negative   Ketones, UA Negative Negative   RBC, UA Negative Negative   Bilirubin, UA Negative Negative   Urobilinogen, Ur 0.2 0.2 - 1.0 mg/dL   Nitrite, UA Negative Negative   Microscopic Examination See below:   Hgb A1c w/o eAG  Result Value Ref Range   Hgb A1c MFr Bld 6.2 (H) 4.8 - 5.6 %      Assessment & Plan:   Problem List Items Addressed This Visit      Unprioritized   Mid back pain on right side    Severe pain much like she had in the past when she went to the pain clinic.  Will refer again to Dr. Pernell DupreAdams.  Rx for Baclofen, Naprosyn.  Discussed heavy purse purse she is carrying.  Needs a urine but unable to go right now. Will RTC if no relief.  Chest x-ray      Relevant Medications   baclofen (LIORESAL) 20 MG tablet   meloxicam (MOBIC) 15 MG tablet   Other Relevant Orders   DG Chest 2 View   Ambulatory referral to Pain Clinic   UA/M w/rflx Culture, Routine       Follow up plan: Return if symptoms worsen or fail to improve.

## 2017-06-14 ENCOUNTER — Other Ambulatory Visit: Payer: Medicare Other

## 2017-06-14 ENCOUNTER — Telehealth: Payer: Self-pay | Admitting: Unknown Physician Specialty

## 2017-06-14 DIAGNOSIS — M549 Dorsalgia, unspecified: Secondary | ICD-10-CM | POA: Diagnosis not present

## 2017-06-14 LAB — UA/M W/RFLX CULTURE, ROUTINE
BILIRUBIN UA: NEGATIVE
Glucose, UA: NEGATIVE
Ketones, UA: NEGATIVE
Leukocytes, UA: NEGATIVE
Nitrite, UA: NEGATIVE
PH UA: 6.5 (ref 5.0–7.5)
PROTEIN UA: NEGATIVE
RBC, UA: NEGATIVE
Specific Gravity, UA: 1.02 (ref 1.005–1.030)
Urobilinogen, Ur: 0.2 mg/dL (ref 0.2–1.0)

## 2017-06-14 NOTE — Telephone Encounter (Signed)
Patient wanted to let Elnita MaxwellCheryl know that she plans to have the xray done on Monday at [facility].  Just FYI

## 2017-06-14 NOTE — Telephone Encounter (Signed)
Routing to provider, FYI.  

## 2017-06-18 ENCOUNTER — Ambulatory Visit
Admission: RE | Admit: 2017-06-18 | Discharge: 2017-06-18 | Disposition: A | Discharge status: Home / Self Care | Payer: Medicare Other | Source: Ambulatory Visit | Attending: Unknown Physician Specialty | Admitting: Unknown Physician Specialty

## 2017-06-18 DIAGNOSIS — Q791 Other congenital malformations of diaphragm: Secondary | ICD-10-CM | POA: Insufficient documentation

## 2017-06-18 DIAGNOSIS — M5134 Other intervertebral disc degeneration, thoracic region: Secondary | ICD-10-CM | POA: Diagnosis not present

## 2017-06-18 DIAGNOSIS — R0789 Other chest pain: Secondary | ICD-10-CM | POA: Diagnosis not present

## 2017-06-18 DIAGNOSIS — M549 Dorsalgia, unspecified: Secondary | ICD-10-CM | POA: Insufficient documentation

## 2017-06-18 DIAGNOSIS — I700 Atherosclerosis of aorta: Secondary | ICD-10-CM | POA: Diagnosis not present

## 2017-06-20 ENCOUNTER — Telehealth: Payer: Self-pay | Admitting: Unknown Physician Specialty

## 2017-06-20 MED ORDER — ATORVASTATIN CALCIUM 20 MG PO TABS: 20.0000 mg | ORAL_TABLET | Freq: Every day | ORAL | 3 refills | Status: DC

## 2017-06-20 MED ORDER — HYDROCODONE-ACETAMINOPHEN 5-325 MG PO TABS: 1.0000 | ORAL_TABLET | Freq: Four times a day (QID) | ORAL | 0 refills | Status: DC | PRN

## 2017-06-20 NOTE — Progress Notes (Signed)
Patient notified of results by phone.

## 2017-06-20 NOTE — Telephone Encounter (Signed)
Discussed with pt that chest x-ray shows arthrosclerosis.  Will start Atorvastatn 20 mg daily.   Still having a great deal of pain.  Referral to pain clinic made.  Will rx #20 of Hyrdocodone 5 mg to pick up

## 2017-06-26 ENCOUNTER — Ambulatory Visit: Payer: Medicare Other | Attending: Anesthesiology | Admitting: Anesthesiology

## 2017-06-26 ENCOUNTER — Other Ambulatory Visit: Payer: Self-pay

## 2017-06-26 ENCOUNTER — Encounter: Payer: Self-pay | Admitting: Anesthesiology

## 2017-06-26 VITALS — BP 139/76 | HR 81 | Temp 98.7°F | Resp 16 | Ht 63.0 in | Wt 158.0 lb

## 2017-06-26 DIAGNOSIS — Z7189 Other specified counseling: Secondary | ICD-10-CM | POA: Diagnosis not present

## 2017-06-26 DIAGNOSIS — I10 Essential (primary) hypertension: Secondary | ICD-10-CM | POA: Diagnosis not present

## 2017-06-26 DIAGNOSIS — M791 Myalgia, unspecified site: Secondary | ICD-10-CM | POA: Diagnosis not present

## 2017-06-26 DIAGNOSIS — Z9049 Acquired absence of other specified parts of digestive tract: Secondary | ICD-10-CM | POA: Diagnosis not present

## 2017-06-26 DIAGNOSIS — Z79899 Other long term (current) drug therapy: Secondary | ICD-10-CM | POA: Insufficient documentation

## 2017-06-26 DIAGNOSIS — R200 Anesthesia of skin: Secondary | ICD-10-CM | POA: Diagnosis not present

## 2017-06-26 DIAGNOSIS — M48061 Spinal stenosis, lumbar region without neurogenic claudication: Secondary | ICD-10-CM

## 2017-06-26 DIAGNOSIS — M5136 Other intervertebral disc degeneration, lumbar region: Secondary | ICD-10-CM | POA: Diagnosis not present

## 2017-06-26 DIAGNOSIS — M545 Low back pain, unspecified: Secondary | ICD-10-CM

## 2017-06-26 DIAGNOSIS — M549 Dorsalgia, unspecified: Secondary | ICD-10-CM | POA: Diagnosis not present

## 2017-06-26 DIAGNOSIS — Z780 Asymptomatic menopausal state: Secondary | ICD-10-CM | POA: Insufficient documentation

## 2017-06-26 DIAGNOSIS — M51369 Other intervertebral disc degeneration, lumbar region without mention of lumbar back pain or lower extremity pain: Secondary | ICD-10-CM

## 2017-06-26 DIAGNOSIS — Z7982 Long term (current) use of aspirin: Secondary | ICD-10-CM | POA: Diagnosis not present

## 2017-06-26 MED ORDER — DEXAMETHASONE SODIUM PHOSPHATE 10 MG/ML IJ SOLN
INTRAMUSCULAR | Status: AC
  Filled 2017-06-26: qty 1

## 2017-06-26 MED ORDER — ROPIVACAINE HCL 2 MG/ML IJ SOLN
10.0000 mL | Freq: Once | INTRAMUSCULAR | Status: AC
  Administered 2017-06-26: 10 mL via EPIDURAL

## 2017-06-26 MED ORDER — ROPIVACAINE HCL 2 MG/ML IJ SOLN
INTRAMUSCULAR | Status: AC
  Filled 2017-06-26: qty 10

## 2017-06-26 MED ORDER — DEXAMETHASONE SODIUM PHOSPHATE 10 MG/ML IJ SOLN
10.0000 mg | Freq: Once | INTRAMUSCULAR | Status: AC
  Administered 2017-06-26: 10 mg

## 2017-06-26 NOTE — Progress Notes (Signed)
Safety precautions to be maintained throughout the outpatient stay will include: orient to surroundings, keep bed in low position, maintain call bell within reach at all times, provide assistance with transfer out of bed and ambulation.  

## 2017-06-26 NOTE — Patient Instructions (Addendum)
Pain Management Discharge Instructions  General Discharge Instructions :  If you need to reach your doctor call: Monday-Friday 8:00 am - 4:00 pm at [phone] or toll free [phone].  After clinic hours [phone] to have operator reach doctor.  Bring all of your medication bottles to all your appointments in the pain clinic.  To cancel or reschedule your appointment with Pain Management please remember to call 24 hours in advance to avoid a fee.  Refer to the educational materials which you have been given on: General Risks, I had my Procedure. Discharge Instructions, Post Sedation.  Post Procedure Instructions:   Please notify your doctor immediately if you have any unusual bleeding, trouble breathing or pain that is not related to your normal pain.  Depending on the type of procedure that was done, some parts of your body may feel week and/or numb.  This usually clears up by tonight or the next day.  Walk with the use of an assistive device or accompanied by an adult for the 24 hours.  You may use ice on the affected area for the first 24 hours.  Put ice in a Ziploc bag and cover with a towel and place against area 15 minutes on 15 minutes off.  You may switch to heat after 24 hours.  Epidural Steroid Injection Patient Information  Description: The epidural space surrounds the nerves as they exit the spinal cord.  In some patients, the nerves can be compressed and inflamed by a bulging disc or a tight spinal canal (spinal stenosis).  By injecting steroids into the epidural space, we can bring irritated nerves into direct contact with a potentially helpful medication.  These steroids act directly on the irritated nerves and can reduce swelling and inflammation which often leads to decreased pain.  Epidural steroids may be injected anywhere along the spine and from the neck to the low back depending upon the location of your pain.   After numbing the skin with local anesthetic  (like Novocaine), a small needle is passed into the epidural space slowly.  You may experience a sensation of pressure while this is being done.  The entire block usually last less than 10 minutes.  Conditions which may be treated by epidural steroids:   Low back and leg pain  Neck and arm pain  Spinal stenosis  Post-laminectomy syndrome  Herpes zoster (shingles) pain  Pain from compression fractures  Preparation for the injection:  1. Do not eat any solid food or dairy products within 3 hours of your appointment.  2. You may drink clear liquids up to 3 hours before appointment.  Clear liquids include water, black coffee, juice or soda.  No milk or cream please. 3. Bring all your current medications with your. 4.  5. A blood pressure cuff, EKG and other monitors will often be applied during the procedure.  Some patients may need to have extra oxygen administered for a short period. 6. You will be asked to provide medical information, including your allergies, prior to the procedure.  We must know immediately if you are taking blood thinners (like Coumadin/Warfarin)  Or if you are allergic to IV iodine contrast (dye). We must know if you could possible be pregnant.  Possible side-effects:  Bleeding from needle site  Infection (rare, may require surgery)  Nerve injury (rare)  Numbness & tingling (temporary)  Difficulty urinating (rare, temporary)  Spinal headache ( a headache worse with upright posture)  Light -headedness (temporary)  Pain at injection site (  several days)  Decreased blood pressure (temporary)  Weakness in arm/leg (temporary)  Pressure sensation in back/neck (temporary)  Call if you experience:  Fever/chills associated with headache or increased back/neck pain.  Headache worsened by an upright position.  New onset weakness or numbness of an extremity below the injection site  Hives or difficulty breathing (go to the emergency  room)  Inflammation or drainage at the infection site  Severe back/neck pain  Any new symptoms which are concerning to you  Please note:  Although the local anesthetic injected can often make your back or neck feel good for several hours after the injection, the pain will likely return.  It takes 3-7 days for steroids to work in the epidural space.  You may not notice any pain relief for at least that one week.  If effective, we will often do a series of three injections spaced 3-6 weeks apart to maximally decrease your pain.  After the initial series, we generally will wait several months before considering a repeat injection of the same type.  If you have any questions, please call [phone] [facility]  You may take Mobic (Meloxicam) and Baclofen for pain.

## 2017-06-27 ENCOUNTER — Telehealth: Payer: Self-pay | Admitting: *Deleted

## 2017-06-27 NOTE — Telephone Encounter (Signed)
Attemtped to call for post procedure follow-up. Message left. 

## 2017-06-28 NOTE — Progress Notes (Signed)
Subjective:  Patient ID: Ellen [name], female    DOB: 11-23-1940  Age: 77 y.o. MRN: [id number]  CC: Back Pain (right, lower)     PROCEDURE: None  HPI Ellen [name] presents for a new patient evaluation.  She was previously seen in 2015 for her low back pain and left lower extremity numbness and tingling.  At that time she had a series of epidural steroid injections and these gave her good relief.  She had essentially resolution of her pain until a few months ago.  She is currently describing a 1 month or greater history of right lower back pain that has had a maximum pain score of 8.  The pain is worse at night and worse with bending.  Sleep seems to alleviate some of the pain.  She has some right side low back pain and and pain that wakes her up at night.  She awoke with this 1 morning.  Her bowel and bladder function has been stable.  Otherwise she is in her usual state of health today.  The pain is described as sharp.  At this point she feels that the pain is essentially similar to what she previously experienced and desires to undergo a repeat series of epidural steroids when available.  History Ellen [name] has a past medical history of Hypertension, Lumbago, Menopause, and Migraines.   She has a past surgical history that includes Cholecystectomy and Tubal ligation.   Her family history includes Alzheimer's disease in her mother; Diabetes in her brother, mother, sister, and sister; Hyperlipidemia in her father and mother; Hypertension in her brother and father; Stroke in her father and mother.She reports that  has never smoked. she has never used smokeless tobacco. She reports that she does not drink alcohol or use drugs.  No results found for this or any previous visit.  No results found for: TOXASSSELUR  Outpatient Medications Prior to Visit  Medication Sig Dispense Refill  . amLODipine (NORVASC) 5 MG tablet Take 1 tablet (5 mg total) by mouth daily. 90 tablet 1  . aspirin 81 MG  tablet Take 81 mg by mouth daily.    Marland Kitchen atorvastatin (LIPITOR) 20 MG tablet Take 1 tablet (20 mg total) by mouth daily. 90 tablet 3  . baclofen (LIORESAL) 20 MG tablet Take 1 tablet (20 mg total) by mouth 3 (three) times daily. 30 each 0  . Cholecalciferol (VITAMIN D3) 1000 units CAPS Take 1 capsule by mouth daily.    Marland Kitchen lisinopril-hydrochlorothiazide (PRINZIDE,ZESTORETIC) 20-25 MG tablet Take 1 tablet by mouth daily. 90 tablet 3  . Magnesium 500 MG TABS Take 1 tablet by mouth daily.    . Omega-3 1000 MG CAPS Take by mouth daily.    . diazepam (VALIUM) 5 MG tablet Take 5 mg by mouth.    Marland Kitchen HYDROcodone-acetaminophen (NORCO/VICODIN) 5-325 MG tablet Take 1 tablet by mouth every 6 (six) hours as needed for moderate pain. (Patient not taking: Reported on 06/26/2017) 20 tablet 0  . meloxicam (MOBIC) 15 MG tablet Take 1 tablet (15 mg total) by mouth daily. (Patient not taking: Reported on 06/26/2017) 30 tablet 0  . UNABLE TO FIND Med Name: Omega XL- 2 capsules daily     No facility-administered medications prior to visit.    Lab Results  Component Value Date   GLUCOSE 101 (H) 04/17/2017   CHOL 174 04/17/2017   TRIG 121 04/17/2017   HDL 53 04/17/2017   LDLCALC 97 04/17/2017   ALT 17 04/17/2017  AST 20 04/17/2017   NA 144 04/17/2017   K 3.8 04/17/2017   CL 102 04/17/2017   CREATININE 0.73 04/17/2017   BUN 11 04/17/2017   CO2 29 04/17/2017   HGBA1C 6.2 (H) 04/17/2017   MICROALBUR 30 (H) 10/02/2016    --------------------------------------------------------------------------------------------------------------------- Dg Chest 2 View  Result Date: 06/18/2017 CLINICAL DATA:  The patient reports lower right chest wall pain posteriorly and laterally for the past 11 days with no known injury. The patient has been on nonsteroidal dx. History of hypertension. EXAM: CHEST  2 VIEW COMPARISON:  None in PACs FINDINGS: There is mild elevation of the right hemidiaphragm. The right lung appears clear. There is  no pneumothorax or pneumomediastinum. There is no pleural effusion. The left lung is clear. The heart and pulmonary vascularity are normal. There is tortuosity of the descending thoracic aorta with calcification in the aortic arch. There is multilevel degenerative disc disease of the thoracic spine. IMPRESSION: Elevation of the right hemidiaphragm. No pulmonary parenchymal abnormality, pneumothorax, or pleural effusion. Moderate degenerative disc disease throughout the thoracic spine. Thoracic aortic atherosclerosis. Electronically Signed   By: David  [place] M.D.   On: 06/18/2017 10:34       ---------------------------------------------------------------------------------------------------------------------- Past Medical History:  Diagnosis Date  . Hypertension   . Lumbago   . Menopause   . Migraines     Past Surgical History:  Procedure Laterality Date  . CHOLECYSTECTOMY    . TUBAL LIGATION      Family History  Problem Relation Age of Onset  . Alzheimer's disease Mother   . Diabetes Mother   . Stroke Mother   . Hyperlipidemia Mother   . Stroke Father   . Hypertension Father   . Hyperlipidemia Father   . Diabetes Sister   . Diabetes Brother   . Hypertension Brother   . Diabetes Sister     Social History   Tobacco Use  . Smoking status: Never Smoker  . Smokeless tobacco: Never Used  Substance Use Topics  . Alcohol use: No    Alcohol/week: 0.0 oz    ---------------------------------------------------------------------------------------------------------------------  Scheduled Meds: Continuous Infusions: PRN Meds:.   BP 139/76   Pulse 81   Temp 98.7 F (37.1 C) (Oral)   Resp 16   Ht 5\' 3"  (1.6 m)   Wt 158 lb (71.7 kg)   LMP 11/28/1994 (Approximate)   SpO2 100%   BMI 27.99 kg/m    BP Readings from Last 3 Encounters:  06/26/17 139/76  06/13/17 (!) 141/79  05/16/17 133/80     Wt Readings from Last 3 Encounters:  06/26/17 158 lb (71.7 kg)  06/13/17  159 lb 6.4 oz (72.3 kg)  05/16/17 158 lb (71.7 kg)     ----------------------------------------------------------------------------------------------------------------------  ROS Review of Systems Cardiac: No angina or palpitations GI: No constipation or abdominal pain Pulmonary: No shortness of breath Constitutional: No nausea vomiting fevers or chills.  Objective:  BP 139/76   Pulse 81   Temp 98.7 F (37.1 C) (Oral)   Resp 16   Ht 5\' 3"  (1.6 m)   Wt 158 lb (71.7 kg)   LMP 11/28/1994 (Approximate)   SpO2 100%   BMI 27.99 kg/m   Physical Exam Patient is alert oriented cooperative compliant. Heart is regular rate and rhythm without murmur or rales Lungs are clear to auscultation Inspection of low back reveals some paraspinous muscle tenderness but no overt trigger points.  With the patient in the supine position she does have a positive straight leg raise  on the right side negative on the left.      Assessment & Plan:   [name] DandyMary was seen today for back pain.  Diagnoses and all orders for this visit:  Spinal stenosis of lumbar region without neurogenic claudication -     Lumbar Epidural Injection; Future  Muscle soreness  Mid back pain on right side  Acute right-sided low back pain without sciatica  DDD (degenerative disc disease), lumbar  Other orders -     dexamethasone (DECADRON) injection 10 mg -     ropivacaine (PF) 2 mg/mL (0.2%) (NAROPIN) injection 10 mL     ----------------------------------------------------------------------------------------------------------------------  Problem List Items Addressed This Visit      Unprioritized   Mid back pain on right side   Relevant Medications   dexamethasone (DECADRON) injection 10 mg (Completed)   Muscle soreness    Other Visit Diagnoses    Spinal stenosis of lumbar region without neurogenic claudication    -  Primary   Relevant Orders   Lumbar Epidural Injection   Acute right-sided low back pain  without sciatica       Relevant Medications   dexamethasone (DECADRON) injection 10 mg (Completed)   DDD (degenerative disc disease), lumbar       Relevant Medications   dexamethasone (DECADRON) injection 10 mg (Completed)      ----------------------------------------------------------------------------------------------------------------------  1. Muscle soreness Right lumbar paraspinous muscle tenderness and I would encourage her to continue with stretching strengthening exercises at this point.  2. Mid back pain on right side As above  3. Acute right-sided low back pain without sciatica We will schedule her for a repeat epidural injection in the next week or so.  We have gone over the risks and benefits of the procedure in full detail and all questions have been answered.  4. DDD (degenerative disc disease), lumbar As above  5. Spinal stenosis of lumbar region without neurogenic claudication As above - Lumbar Epidural Injection; Future    ----------------------------------------------------------------------------------------------------------------------  I am having Ellen SpittleMary E. [name] maintain her aspirin, UNABLE TO FIND, Vitamin D3, Magnesium, lisinopril-hydrochlorothiazide, amLODipine, diazepam, baclofen, meloxicam, HYDROcodone-acetaminophen, atorvastatin, and Omega-3. We administered dexamethasone and ropivacaine (PF) 2 mg/mL (0.2%).   Meds ordered this encounter  Medications  . dexamethasone (DECADRON) injection 10 mg  . ropivacaine (PF) 2 mg/mL (0.2%) (NAROPIN) injection 10 mL       Follow-up: Return for evaluation, procedure.    Yevette EdwardsJames G Nida Manfredi, MD 11:37 AM  The [place] practitioner database for opioid medications on this patient has been reviewed by me and my staff   Greater than 50% of the total encounter time was spent in counseling and / or coordination of care.     This dictation was performed utilizing Conservation officer, historic buildingsDragon voice recognition software.  Please excuse any  unintentional or mistaken typographical errors as a result.

## 2017-07-26 ENCOUNTER — Encounter: Payer: Self-pay | Admitting: Anesthesiology

## 2017-07-26 ENCOUNTER — Ambulatory Visit (HOSPITAL_BASED_OUTPATIENT_CLINIC_OR_DEPARTMENT_OTHER): Payer: Medicare Other | Admitting: Anesthesiology

## 2017-07-26 ENCOUNTER — Ambulatory Visit
Admission: RE | Admit: 2017-07-26 | Discharge: 2017-07-26 | Disposition: A | Discharge status: Home / Self Care | Payer: Medicare Other | Source: Ambulatory Visit | Attending: Anesthesiology | Admitting: Anesthesiology

## 2017-07-26 ENCOUNTER — Other Ambulatory Visit: Payer: Self-pay

## 2017-07-26 ENCOUNTER — Other Ambulatory Visit: Payer: Self-pay | Admitting: Anesthesiology

## 2017-07-26 VITALS — BP 134/80 | HR 89 | Temp 98.4°F | Resp 18 | Ht 62.0 in | Wt 156.0 lb

## 2017-07-26 DIAGNOSIS — M48062 Spinal stenosis, lumbar region with neurogenic claudication: Secondary | ICD-10-CM | POA: Diagnosis not present

## 2017-07-26 DIAGNOSIS — M791 Myalgia, unspecified site: Secondary | ICD-10-CM

## 2017-07-26 DIAGNOSIS — M549 Dorsalgia, unspecified: Secondary | ICD-10-CM | POA: Diagnosis not present

## 2017-07-26 DIAGNOSIS — M48061 Spinal stenosis, lumbar region without neurogenic claudication: Secondary | ICD-10-CM

## 2017-07-26 DIAGNOSIS — M25561 Pain in right knee: Secondary | ICD-10-CM

## 2017-07-26 DIAGNOSIS — M5136 Other intervertebral disc degeneration, lumbar region: Secondary | ICD-10-CM

## 2017-07-26 DIAGNOSIS — R52 Pain, unspecified: Secondary | ICD-10-CM

## 2017-07-26 DIAGNOSIS — M5441 Lumbago with sciatica, right side: Secondary | ICD-10-CM

## 2017-07-26 DIAGNOSIS — M5431 Sciatica, right side: Secondary | ICD-10-CM

## 2017-07-26 DIAGNOSIS — G8929 Other chronic pain: Secondary | ICD-10-CM

## 2017-07-26 MED ORDER — LIDOCAINE HCL (PF) 1 % IJ SOLN
5.0000 mL | Freq: Once | INTRAMUSCULAR | Status: AC
  Administered 2017-07-26: 5 mL via SUBCUTANEOUS
  Filled 2017-07-26: qty 5

## 2017-07-26 MED ORDER — LACTATED RINGERS IV SOLN
1000.0000 mL | INTRAVENOUS | Status: DC
  Administered 2017-07-26: 1000 mL via INTRAVENOUS

## 2017-07-26 MED ORDER — IOPAMIDOL (ISOVUE-M 200) INJECTION 41%
20.0000 mL | Freq: Once | INTRAMUSCULAR | Status: DC | PRN
  Administered 2017-07-26: 10 mL
  Filled 2017-07-26: qty 20

## 2017-07-26 MED ORDER — SODIUM CHLORIDE 0.9 % IJ SOLN
INTRAMUSCULAR | Status: AC
  Filled 2017-07-26: qty 10

## 2017-07-26 MED ORDER — IOPAMIDOL (ISOVUE-M 200) INJECTION 41%
INTRAMUSCULAR | Status: AC
  Filled 2017-07-26: qty 10

## 2017-07-26 MED ORDER — TRIAMCINOLONE ACETONIDE 40 MG/ML IJ SUSP
40.0000 mg | Freq: Once | INTRAMUSCULAR | Status: AC
  Administered 2017-07-26: 40 mg
  Filled 2017-07-26: qty 1

## 2017-07-26 MED ORDER — SODIUM CHLORIDE 0.9% FLUSH
10.0000 mL | Freq: Once | INTRAVENOUS | Status: AC
  Administered 2017-07-26: 10 mL

## 2017-07-26 MED ORDER — MIDAZOLAM HCL 5 MG/5ML IJ SOLN
5.0000 mg | Freq: Once | INTRAMUSCULAR | Status: AC
  Administered 2017-07-26: 3 mg via INTRAVENOUS
  Filled 2017-07-26: qty 5

## 2017-07-26 MED ORDER — ROPIVACAINE HCL 2 MG/ML IJ SOLN
10.0000 mL | Freq: Once | INTRAMUSCULAR | Status: AC
  Administered 2017-07-26: 2 mL via EPIDURAL
  Filled 2017-07-26: qty 10

## 2017-07-26 NOTE — Progress Notes (Signed)
Subjective:  Patient ID: Ellen [name], female    DOB: 1940-10-02  Age: 77 y.o. MRN: [id number]  CC: Back Pain (right low and mid) and Leg Pain (right and lateral)   Procedure: Lumbar epidural steroid at L5-S1 #1 under fluoroscopic guidance with moderate sedation  HPI Ellen [name] presents for reevaluation.  She was last seen a month ago and presents for her first epidural steroid injection.  The quality characteristic and distribution of her low back pain of remained stable in nature.  She is primarily getting radiation of right side low back pain into the right calf buttocks with some numbness and tingling affecting the right foot and right lateral calf region.  The pain has remained quite recalcitrant despite conservative therapy with physical therapy exercises yielding no significant improvement and the pain remaining quite severe.  She has had previous epidurals in the past and responded favorably to these.  The pain has been long-standing without change in her lower extremity strength or function with no changes reported regarding bowel or bladder function.  Outpatient Medications Prior to Visit  Medication Sig Dispense Refill  . amLODipine (NORVASC) 5 MG tablet Take 1 tablet (5 mg total) by mouth daily. 90 tablet 1  . aspirin 81 MG tablet Take 81 mg by mouth daily.    Marland Kitchen atorvastatin (LIPITOR) 20 MG tablet Take 1 tablet (20 mg total) by mouth daily. 90 tablet 3  . baclofen (LIORESAL) 20 MG tablet Take 1 tablet (20 mg total) by mouth 3 (three) times daily. 30 each 0  . Cholecalciferol (VITAMIN D3) 1000 units CAPS Take 1 capsule by mouth daily.    Marland Kitchen lisinopril-hydrochlorothiazide (PRINZIDE,ZESTORETIC) 20-25 MG tablet Take 1 tablet by mouth daily. 90 tablet 3  . Magnesium 500 MG TABS Take 1 tablet by mouth daily.    . Omega-3 1000 MG CAPS Take by mouth daily.    . diazepam (VALIUM) 5 MG tablet Take 5 mg by mouth.    Marland Kitchen HYDROcodone-acetaminophen (NORCO/VICODIN) 5-325 MG tablet Take  1 tablet by mouth every 6 (six) hours as needed for moderate pain. (Patient not taking: Reported on 06/26/2017) 20 tablet 0  . meloxicam (MOBIC) 15 MG tablet Take 1 tablet (15 mg total) by mouth daily. (Patient not taking: Reported on 06/26/2017) 30 tablet 0  . UNABLE TO FIND Med Name: Omega XL- 2 capsules daily     No facility-administered medications prior to visit.     Review of Systems CNS: No confusion or sedation Cardiac: No angina or palpitations GI: No abdominal pain or constipation Constitutional: No nausea vomiting fevers or chills  Objective:  BP 134/80   Pulse 89   Temp 98.4 F (36.9 C) (Temporal)   Resp 18   Ht 5\' 2"  (1.575 m)   Wt 156 lb (70.8 kg)   LMP 11/28/1994 (Approximate)   SpO2 97%   BMI 28.53 kg/m    BP Readings from Last 3 Encounters:  07/26/17 134/80  06/26/17 139/76  06/13/17 (!) 141/79     Wt Readings from Last 3 Encounters:  07/26/17 156 lb (70.8 kg)  06/26/17 158 lb (71.7 kg)  06/13/17 159 lb 6.4 oz (72.3 kg)     Physical Exam Pt is alert and oriented PERRL EOMI HEART IS RRR no murmur or rub LCTA no wheezing or rales MUSCULOSKELETAL reveals a positive straight leg raise right side.  Her muscle tone and bulk to the lower extremities is at baseline.  Otherwise her exam is unchanged from previous  evaluation.  Labs  Lab Results  Component Value Date   HGBA1C 6.2 (H) 04/17/2017   Lab Results  Component Value Date   MICROALBUR 30 (H) 10/02/2016   LDLCALC 97 04/17/2017   CREATININE 0.73 04/17/2017    -------------------------------------------------------------------------------------------------------------------- Lab Results  Component Value Date   GLUCOSE 101 (H) 04/17/2017   CHOL 174 04/17/2017   TRIG 121 04/17/2017   HDL 53 04/17/2017   LDLCALC 97 04/17/2017   ALT 17 04/17/2017   AST 20 04/17/2017   NA 144 04/17/2017   K 3.8 04/17/2017   CL 102 04/17/2017   CREATININE 0.73 04/17/2017   BUN 11 04/17/2017   CO2 29  04/17/2017   HGBA1C 6.2 (H) 04/17/2017   MICROALBUR 30 (H) 10/02/2016    --------------------------------------------------------------------------------------------------------------------- Dg C-arm 1-60 Min-no Report  Result Date: 07/26/2017 Fluoroscopy was utilized by the requesting physician.  No radiographic interpretation.     Assessment & Plan:   Ellen [name] was seen today for back pain and leg pain.  Diagnoses and all orders for this visit:  Mid back pain on right side  Spinal stenosis of lumbar region with neurogenic claudication -     Lumbar Epidural Injection -     triamcinolone acetonide (KENALOG-40) injection 40 mg -     sodium chloride flush (NS) 0.9 % injection 10 mL -     ropivacaine (PF) 2 mg/mL (0.2%) (NAROPIN) injection 10 mL -     midazolam (VERSED) 5 MG/5ML injection 5 mg -     lidocaine (PF) (XYLOCAINE) 1 % injection 5 mL -     lactated ringers infusion 1,000 mL -     iopamidol (ISOVUE-M) 41 % intrathecal injection 20 mL -     Lumbar Epidural Injection; Future  DDD (degenerative disc disease), lumbar -     Lumbar Epidural Injection; Future  Sciatica of right side -     triamcinolone acetonide (KENALOG-40) injection 40 mg -     sodium chloride flush (NS) 0.9 % injection 10 mL -     ropivacaine (PF) 2 mg/mL (0.2%) (NAROPIN) injection 10 mL -     midazolam (VERSED) 5 MG/5ML injection 5 mg -     lidocaine (PF) (XYLOCAINE) 1 % injection 5 mL -     lactated ringers infusion 1,000 mL -     iopamidol (ISOVUE-M) 41 % intrathecal injection 20 mL -     Lumbar Epidural Injection; Future  Muscle soreness  Chronic right-sided low back pain with right-sided sciatica  Spinal stenosis of lumbar region without neurogenic claudication  Chronic pain of right knee        ----------------------------------------------------------------------------------------------------------------------  Problem List Items Addressed This Visit      Unprioritized   Mid back pain  on right side - Primary   Relevant Medications   triamcinolone acetonide (KENALOG-40) injection 40 mg (Completed)   Muscle soreness    Other Visit Diagnoses    Spinal stenosis of lumbar region with neurogenic claudication       Relevant Medications   triamcinolone acetonide (KENALOG-40) injection 40 mg (Completed)   sodium chloride flush (NS) 0.9 % injection 10 mL (Completed)   ropivacaine (PF) 2 mg/mL (0.2%) (NAROPIN) injection 10 mL (Completed)   midazolam (VERSED) 5 MG/5ML injection 5 mg (Completed)   lidocaine (PF) (XYLOCAINE) 1 % injection 5 mL (Completed)   lactated ringers infusion 1,000 mL   iopamidol (ISOVUE-M) 41 % intrathecal injection 20 mL   Other Relevant Orders   Lumbar Epidural Injection  DDD (degenerative disc disease), lumbar       Relevant Medications   triamcinolone acetonide (KENALOG-40) injection 40 mg (Completed)   Other Relevant Orders   Lumbar Epidural Injection   Sciatica of right side       Relevant Medications   triamcinolone acetonide (KENALOG-40) injection 40 mg (Completed)   sodium chloride flush (NS) 0.9 % injection 10 mL (Completed)   ropivacaine (PF) 2 mg/mL (0.2%) (NAROPIN) injection 10 mL (Completed)   midazolam (VERSED) 5 MG/5ML injection 5 mg (Completed)   lidocaine (PF) (XYLOCAINE) 1 % injection 5 mL (Completed)   lactated ringers infusion 1,000 mL   iopamidol (ISOVUE-M) 41 % intrathecal injection 20 mL   Other Relevant Orders   Lumbar Epidural Injection   Chronic right-sided low back pain with right-sided sciatica       Relevant Medications   triamcinolone acetonide (KENALOG-40) injection 40 mg (Completed)   midazolam (VERSED) 5 MG/5ML injection 5 mg (Completed)   Spinal stenosis of lumbar region without neurogenic claudication       Chronic pain of right knee            ----------------------------------------------------------------------------------------------------------------------  1. Spinal stenosis of lumbar region with  neurogenic claudication She continues to get pain with prolonged standing and associated numbness and weakness affecting the lower extremities.  We will proceed with a repeat epidural steroid injection for her spinal stenosis and right L5 radiculitis today.  She is responded favorably to these in the past and I am hopeful she will get some relief as conservative therapy has failed to give her any significant improvement.  We once again reviewed her previous MRI findings as well today.  Her questions have been answered and risk benefits reviewed and accepted.  She is to return to clinic in 1 month for reevaluation possible repeat injection at that time. - Lumbar Epidural Injection - triamcinolone acetonide (KENALOG-40) injection 40 mg - sodium chloride flush (NS) 0.9 % injection 10 mL - ropivacaine (PF) 2 mg/mL (0.2%) (NAROPIN) injection 10 mL - midazolam (VERSED) 5 MG/5ML injection 5 mg - lidocaine (PF) (XYLOCAINE) 1 % injection 5 mL - lactated ringers infusion 1,000 mL - iopamidol (ISOVUE-M) 41 % intrathecal injection 20 mL - Lumbar Epidural Injection; Future  2. Mid back pain on right side Continue core stretching strengthening exercises  3. DDD (degenerative disc disease), lumbar As above - Lumbar Epidural Injection; Future  4. Sciatica of right side As above - triamcinolone acetonide (KENALOG-40) injection 40 mg - sodium chloride flush (NS) 0.9 % injection 10 mL - ropivacaine (PF) 2 mg/mL (0.2%) (NAROPIN) injection 10 mL - midazolam (VERSED) 5 MG/5ML injection 5 mg - lidocaine (PF) (XYLOCAINE) 1 % injection 5 mL - lactated ringers infusion 1,000 mL - iopamidol (ISOVUE-M) 41 % intrathecal injection 20 mL - Lumbar Epidural Injection; Future  5. Muscle soreness   6. Chronic right-sided low back pain with right-sided sciatica   7. Spinal stenosis of lumbar region without neurogenic claudication   8. Chronic pain of right  knee     ----------------------------------------------------------------------------------------------------------------------  I am having Ellen [name] maintain her aspirin, UNABLE TO FIND, Vitamin D3, Magnesium, lisinopril-hydrochlorothiazide, amLODipine, diazepam, baclofen, meloxicam, HYDROcodone-acetaminophen, atorvastatin, and Omega-3. We administered triamcinolone acetonide, sodium chloride flush, ropivacaine (PF) 2 mg/mL (0.2%), midazolam, lidocaine (PF), lactated ringers, and iopamidol.   Meds ordered this encounter  Medications  . triamcinolone acetonide (KENALOG-40) injection 40 mg  . sodium chloride flush (NS) 0.9 % injection 10 mL  . ropivacaine (PF) 2 mg/mL (  0.2%) (NAROPIN) injection 10 mL  . midazolam (VERSED) 5 MG/5ML injection 5 mg  . lidocaine (PF) (XYLOCAINE) 1 % injection 5 mL  . lactated ringers infusion 1,000 mL  . iopamidol (ISOVUE-M) 41 % intrathecal injection 20 mL   Patient's Medications  New Prescriptions   No medications on file  Previous Medications   AMLODIPINE (NORVASC) 5 MG TABLET    Take 1 tablet (5 mg total) by mouth daily.   ASPIRIN 81 MG TABLET    Take 81 mg by mouth daily.   ATORVASTATIN (LIPITOR) 20 MG TABLET    Take 1 tablet (20 mg total) by mouth daily.   BACLOFEN (LIORESAL) 20 MG TABLET    Take 1 tablet (20 mg total) by mouth 3 (three) times daily.   CHOLECALCIFEROL (VITAMIN D3) 1000 UNITS CAPS    Take 1 capsule by mouth daily.   DIAZEPAM (VALIUM) 5 MG TABLET    Take 5 mg by mouth.   HYDROCODONE-ACETAMINOPHEN (NORCO/VICODIN) 5-325 MG TABLET    Take 1 tablet by mouth every 6 (six) hours as needed for moderate pain.   LISINOPRIL-HYDROCHLOROTHIAZIDE (PRINZIDE,ZESTORETIC) 20-25 MG TABLET    Take 1 tablet by mouth daily.   MAGNESIUM 500 MG TABS    Take 1 tablet by mouth daily.   MELOXICAM (MOBIC) 15 MG TABLET    Take 1 tablet (15 mg total) by mouth daily.   OMEGA-3 1000 MG CAPS    Take by mouth daily.   UNABLE TO FIND    Med Name: Omega XL-  2 capsules daily  Modified Medications   No medications on file  Discontinued Medications   No medications on file   ----------------------------------------------------------------------------------------------------------------------  Follow-up: Return in about 1 month (around 08/23/2017) for procedure, evaluation.   Procedure: L5-S1 epidural steroid under fluoroscopic guidance moderate sedation   Procedure: 5 S1 LESI with fluoroscopic guidance and moderate sedation  NOTE: The risks, benefits, and expectations of the procedure have been discussed and explained to the patient who was understanding and in agreement with suggested treatment plan. No guarantees were made.  DESCRIPTION OF PROCEDURE: Lumbar epidural steroid injection with  IV Versed, EKG, blood pressure, pulse, and pulse oximetry monitoring. The procedure was performed with the patient in the prone position under fluoroscopic guidance.  Sterile prep x3 was initiated and I then injected subcutaneous lidocaine to the overlying L5-S1 site after its fluoroscopic identifictation.  Using strict aseptic technique, I then advanced an 18-gauge Tuohy epidural needle in the midline using interlaminar approach via loss-of-resistance to saline technique. There was negative aspiration for heme or  CSF.  I then confirmed position with both AP and Lateral fluoroscan.  2 cc of Isovue were injected and a  total of 5 mL of Preservative-Free normal saline mixed with 40 mg of Kenalog and 1cc Ropicaine 0.2 percent were injected incrementally via the  epidurally placed needle. The needle was removed. The patient tolerated the injection well and was convalesced and discharged to home in stable condition. Should the patient have any post procedure difficulty they have been instructed on how to contact us for assistance.   Yevette EdwardsJames G Adams, MD

## 2017-07-26 NOTE — Progress Notes (Signed)
Safety precautions to be maintained throughout the outpatient stay will include: orient to surroundings, keep bed in low position, maintain call bell within reach at all times, provide assistance with transfer out of bed and ambulation.  

## 2017-07-26 NOTE — Patient Instructions (Signed)

## 2017-07-27 ENCOUNTER — Telehealth: Payer: Self-pay

## 2017-07-27 NOTE — Telephone Encounter (Signed)
Post procedure phone call.  Left message.  

## 2017-08-29 ENCOUNTER — Ambulatory Visit
Admission: RE | Admit: 2017-08-29 | Discharge: 2017-08-29 | Disposition: A | Discharge status: Home / Self Care | Payer: Medicare Other | Source: Ambulatory Visit | Attending: Anesthesiology | Admitting: Anesthesiology

## 2017-08-29 ENCOUNTER — Other Ambulatory Visit: Payer: Self-pay | Admitting: Anesthesiology

## 2017-08-29 ENCOUNTER — Encounter: Payer: Self-pay | Admitting: Anesthesiology

## 2017-08-29 ENCOUNTER — Other Ambulatory Visit: Payer: Self-pay

## 2017-08-29 ENCOUNTER — Ambulatory Visit (HOSPITAL_BASED_OUTPATIENT_CLINIC_OR_DEPARTMENT_OTHER): Payer: Medicare Other | Admitting: Anesthesiology

## 2017-08-29 VITALS — BP 159/74 | HR 85 | Temp 98.5°F | Resp 16 | Ht 60.0 in | Wt 155.0 lb

## 2017-08-29 DIAGNOSIS — M5431 Sciatica, right side: Secondary | ICD-10-CM

## 2017-08-29 DIAGNOSIS — G8929 Other chronic pain: Secondary | ICD-10-CM | POA: Diagnosis not present

## 2017-08-29 DIAGNOSIS — M48062 Spinal stenosis, lumbar region with neurogenic claudication: Secondary | ICD-10-CM

## 2017-08-29 DIAGNOSIS — Z791 Long term (current) use of non-steroidal anti-inflammatories (NSAID): Secondary | ICD-10-CM | POA: Diagnosis not present

## 2017-08-29 DIAGNOSIS — M25561 Pain in right knee: Secondary | ICD-10-CM | POA: Insufficient documentation

## 2017-08-29 DIAGNOSIS — M549 Dorsalgia, unspecified: Secondary | ICD-10-CM

## 2017-08-29 DIAGNOSIS — M5136 Other intervertebral disc degeneration, lumbar region: Secondary | ICD-10-CM | POA: Insufficient documentation

## 2017-08-29 DIAGNOSIS — R52 Pain, unspecified: Secondary | ICD-10-CM

## 2017-08-29 DIAGNOSIS — Z7982 Long term (current) use of aspirin: Secondary | ICD-10-CM | POA: Diagnosis not present

## 2017-08-29 DIAGNOSIS — Z79899 Other long term (current) drug therapy: Secondary | ICD-10-CM | POA: Diagnosis not present

## 2017-08-29 MED ORDER — TRIAMCINOLONE ACETONIDE 40 MG/ML IJ SUSP
40.0000 mg | Freq: Once | INTRAMUSCULAR | Status: AC
  Administered 2017-08-29: 40 mg
  Filled 2017-08-29: qty 1

## 2017-08-29 MED ORDER — ROPIVACAINE HCL 2 MG/ML IJ SOLN
10.0000 mL | Freq: Once | INTRAMUSCULAR | Status: AC
  Administered 2017-08-29: 10 mL via EPIDURAL
  Filled 2017-08-29: qty 10

## 2017-08-29 MED ORDER — SODIUM CHLORIDE 0.9% FLUSH
10.0000 mL | Freq: Once | INTRAVENOUS | Status: AC
  Administered 2017-08-29: 10 mL

## 2017-08-29 MED ORDER — IOPAMIDOL (ISOVUE-M 200) INJECTION 41%
20.0000 mL | Freq: Once | INTRAMUSCULAR | Status: DC | PRN
  Administered 2017-08-29: 10 mL
  Filled 2017-08-29: qty 20

## 2017-08-29 MED ORDER — LIDOCAINE HCL (PF) 1 % IJ SOLN
5.0000 mL | Freq: Once | INTRAMUSCULAR | Status: AC
  Administered 2017-08-29: 5 mL via SUBCUTANEOUS
  Filled 2017-08-29: qty 5

## 2017-08-29 NOTE — Patient Instructions (Signed)

## 2017-08-29 NOTE — Progress Notes (Signed)
Subjective:  Patient ID: Ellen [name], female    DOB: Sep 24, 1940  Age: 77 y.o. MRN: [id number]  CC: Back Pain (low and right)   Procedure: L5-S1 epidural steroid and fluoroscopic guidance transition to L4-5 on second pass without sedation  HPI Ellen [name] presents for reevaluation.  She has had approximate 50 to 75% relief in [name] low back and right leg pain.  Otherwise she is in [name] usual state of health and desires to proceed with a repeat injection today.  The quality characteristic distribution of [name] pain are otherwise stable in nature without documented change at this point.  Outpatient Medications Prior to Visit  Medication Sig Dispense Refill  . amLODipine (NORVASC) 5 MG tablet Take 1 tablet (5 mg total) by mouth daily. 90 tablet 1  . aspirin 81 MG tablet Take 81 mg by mouth daily.    Marland Kitchen atorvastatin (LIPITOR) 20 MG tablet Take 1 tablet (20 mg total) by mouth daily. 90 tablet 3  . baclofen (LIORESAL) 20 MG tablet Take 1 tablet (20 mg total) by mouth 3 (three) times daily. 30 each 0  . Cholecalciferol (VITAMIN D3) 1000 units CAPS Take 1 capsule by mouth daily.    . diazepam (VALIUM) 5 MG tablet Take 5 mg by mouth.    Marland Kitchen HYDROcodone-acetaminophen (NORCO/VICODIN) 5-325 MG tablet Take 1 tablet by mouth every 6 (six) hours as needed for moderate pain. 20 tablet 0  . lisinopril-hydrochlorothiazide (PRINZIDE,ZESTORETIC) 20-25 MG tablet Take 1 tablet by mouth daily. 90 tablet 3  . Magnesium 500 MG TABS Take 1 tablet by mouth daily.    . meloxicam (MOBIC) 15 MG tablet Take 1 tablet (15 mg total) by mouth daily. 30 tablet 0  . Omega-3 1000 MG CAPS Take by mouth daily.    Marland Kitchen UNABLE TO FIND Med Name: Omega XL- 2 capsules daily     No facility-administered medications prior to visit.     Review of Systems CNS: No confusion or sedation Cardiac: No angina or palpitations GI: No abdominal pain or constipation Constitutional: No nausea vomiting fevers or chills  Objective:  BP  (!) 159/74   Pulse 85   Temp 98.5 F (36.9 C) (Oral)   Resp 16   Ht 5' (1.524 m)   Wt 155 lb (70.3 kg)   LMP 11/28/1994 (Approximate)   SpO2 100%   BMI 30.27 kg/m    BP Readings from Last 3 Encounters:  08/29/17 (!) 159/74  07/26/17 134/80  06/26/17 139/76     Wt Readings from Last 3 Encounters:  08/29/17 155 lb (70.3 kg)  07/26/17 156 lb (70.8 kg)  06/26/17 158 lb (71.7 kg)     Physical Exam Pt is alert and oriented PERRL EOMI HEART IS RRR no murmur or rub LCTA no wheezing or rales MUSCULOSKELETAL reveals some persistent right paraspinous muscle tenderness.  She has a straight leg raise that is positive on the right side negative on the left.  This is approximately 45 degrees.  Labs  Lab Results  Component Value Date   HGBA1C 6.2 (H) 04/17/2017   Lab Results  Component Value Date   MICROALBUR 30 (H) 10/02/2016   LDLCALC 97 04/17/2017   CREATININE 0.73 04/17/2017    -------------------------------------------------------------------------------------------------------------------- Lab Results  Component Value Date   GLUCOSE 101 (H) 04/17/2017   CHOL 174 04/17/2017   TRIG 121 04/17/2017   HDL 53 04/17/2017   LDLCALC 97 04/17/2017   ALT 17 04/17/2017   AST 20 04/17/2017  NA 144 04/17/2017   K 3.8 04/17/2017   CL 102 04/17/2017   CREATININE 0.73 04/17/2017   BUN 11 04/17/2017   CO2 29 04/17/2017   HGBA1C 6.2 (H) 04/17/2017   MICROALBUR 30 (H) 10/02/2016    --------------------------------------------------------------------------------------------------------------------- Dg C-arm 1-60 Min-no Report  Result Date: 08/29/2017 Fluoroscopy was utilized by the requesting physician.  No radiographic interpretation.     Assessment & Plan:   Ellen DandyMary was seen today for back pain.  Diagnoses and all orders for this visit:  Spinal stenosis of lumbar region with neurogenic claudication -     triamcinolone acetonide (KENALOG-40) injection 40 mg -      sodium chloride flush (NS) 0.9 % injection 10 mL -     ropivacaine (PF) 2 mg/mL (0.2%) (NAROPIN) injection 10 mL -     lidocaine (PF) (XYLOCAINE) 1 % injection 5 mL -     iopamidol (ISOVUE-M) 41 % intrathecal injection 20 mL -     Lumbar Epidural Injection; Future -     Lumbar Epidural Injection  Mid back pain on right side  Sciatica of right side -     triamcinolone acetonide (KENALOG-40) injection 40 mg -     sodium chloride flush (NS) 0.9 % injection 10 mL -     ropivacaine (PF) 2 mg/mL (0.2%) (NAROPIN) injection 10 mL -     lidocaine (PF) (XYLOCAINE) 1 % injection 5 mL -     iopamidol (ISOVUE-M) 41 % intrathecal injection 20 mL -     Lumbar Epidural Injection; Future -     Lumbar Epidural Injection  DDD (degenerative disc disease), lumbar -     Lumbar Epidural Injection  Chronic pain of right knee        ----------------------------------------------------------------------------------------------------------------------  Problem List Items Addressed This Visit      Unprioritized   Mid back pain on right side   Relevant Medications   triamcinolone acetonide (KENALOG-40) injection 40 mg (Completed)    Other Visit Diagnoses    Spinal stenosis of lumbar region with neurogenic claudication    -  Primary   Relevant Medications   triamcinolone acetonide (KENALOG-40) injection 40 mg (Completed)   sodium chloride flush (NS) 0.9 % injection 10 mL (Completed)   ropivacaine (PF) 2 mg/mL (0.2%) (NAROPIN) injection 10 mL (Completed)   lidocaine (PF) (XYLOCAINE) 1 % injection 5 mL (Completed)   iopamidol (ISOVUE-M) 41 % intrathecal injection 20 mL   Other Relevant Orders   Lumbar Epidural Injection   Sciatica of right side       Relevant Medications   triamcinolone acetonide (KENALOG-40) injection 40 mg (Completed)   sodium chloride flush (NS) 0.9 % injection 10 mL (Completed)   ropivacaine (PF) 2 mg/mL (0.2%) (NAROPIN) injection 10 mL (Completed)   lidocaine (PF) (XYLOCAINE)  1 % injection 5 mL (Completed)   iopamidol (ISOVUE-M) 41 % intrathecal injection 20 mL   Other Relevant Orders   Lumbar Epidural Injection   DDD (degenerative disc disease), lumbar       Relevant Medications   triamcinolone acetonide (KENALOG-40) injection 40 mg (Completed)   Chronic pain of right knee            ----------------------------------------------------------------------------------------------------------------------  1. Spinal stenosis of lumbar region with neurogenic claudication We will proceed with a repeat epidural injection at L5-S1 today.  The risks and benefits of once again reviewed and reviewed in full detail and all questions answered.  We will have [name] return to clinic in 1 month for  [name] third epidural injection. - triamcinolone acetonide (KENALOG-40) injection 40 mg - sodium chloride flush (NS) 0.9 % injection 10 mL - ropivacaine (PF) 2 mg/mL (0.2%) (NAROPIN) injection 10 mL - lidocaine (PF) (XYLOCAINE) 1 % injection 5 mL - iopamidol (ISOVUE-M) 41 % intrathecal injection 20 mL - Lumbar Epidural Injection; Future - Lumbar Epidural Injection  2. Mid back pain on right side As above continue back stretching strengthening exercises as reviewed  3. Sciatica of right side As above - triamcinolone acetonide (KENALOG-40) injection 40 mg - sodium chloride flush (NS) 0.9 % injection 10 mL - ropivacaine (PF) 2 mg/mL (0.2%) (NAROPIN) injection 10 mL - lidocaine (PF) (XYLOCAINE) 1 % injection 5 mL - iopamidol (ISOVUE-M) 41 % intrathecal injection 20 mL - Lumbar Epidural Injection; Future - Lumbar Epidural Injection  4. DDD (degenerative disc disease), lumbar As above - Lumbar Epidural Injection  5. Chronic pain of right knee This is slowly improving she states.  Continue aerobic conditioning    ----------------------------------------------------------------------------------------------------------------------  I am having Ellen [name]  aspirin, UNABLE TO FIND, Vitamin D3, Magnesium, lisinopril-hydrochlorothiazide, amLODipine, diazepam, baclofen, meloxicam, HYDROcodone-acetaminophen, atorvastatin, and Omega-3. We administered triamcinolone acetonide, sodium chloride flush, ropivacaine (PF) 2 mg/mL (0.2%), lidocaine (PF), and iopamidol.   Meds ordered this encounter  Medications  . triamcinolone acetonide (KENALOG-40) injection 40 mg  . sodium chloride flush (NS) 0.9 % injection 10 mL  . ropivacaine (PF) 2 mg/mL (0.2%) (NAROPIN) injection 10 mL  . lidocaine (PF) (XYLOCAINE) 1 % injection 5 mL  . iopamidol (ISOVUE-M) 41 % intrathecal injection 20 mL   Patient's Medications  New Prescriptions   No medications on file  Previous Medications   AMLODIPINE (NORVASC) 5 MG TABLET    Take 1 tablet (5 mg total) by mouth daily.   ASPIRIN 81 MG TABLET    Take 81 mg by mouth daily.   ATORVASTATIN (LIPITOR) 20 MG TABLET    Take 1 tablet (20 mg total) by mouth daily.   BACLOFEN (LIORESAL) 20 MG TABLET    Take 1 tablet (20 mg total) by mouth 3 (three) times daily.   CHOLECALCIFEROL (VITAMIN D3) 1000 UNITS CAPS    Take 1 capsule by mouth daily.   DIAZEPAM (VALIUM) 5 MG TABLET    Take 5 mg by mouth.   HYDROCODONE-ACETAMINOPHEN (NORCO/VICODIN) 5-325 MG TABLET    Take 1 tablet by mouth every 6 (six) hours as needed for moderate pain.   LISINOPRIL-HYDROCHLOROTHIAZIDE (PRINZIDE,ZESTORETIC) 20-25 MG TABLET    Take 1 tablet by mouth daily.   MAGNESIUM 500 MG TABS    Take 1 tablet by mouth daily.   MELOXICAM (MOBIC) 15 MG TABLET    Take 1 tablet (15 mg total) by mouth daily.   OMEGA-3 1000 MG CAPS    Take by mouth daily.   UNABLE TO FIND    Med Name: Omega XL- 2 capsules daily  Modified Medications   No medications on file  Discontinued Medications   No medications on file   ----------------------------------------------------------------------------------------------------------------------  Follow-up: Return in about 1 month (around  09/29/2017) for evaluation, procedure.  Seizure: L5-S1 epidural steroid convert to L4-5 epidural steroid under fluoroscopic guidance without sedation   Procedure: L4-5 LESI with fluoroscopic guidance and moderate sedation  NOTE: The risks, benefits, and expectations of the procedure have been discussed and explained to the patient who was understanding and in agreement with suggested treatment plan. No guarantees were made.  DESCRIPTION OF PROCEDURE: Lumbar epidural steroid injection with no IV Versed,  EKG, blood pressure, pulse, and pulse oximetry monitoring. The procedure was performed with the patient in the prone position under fluoroscopic guidance.  Sterile prep x3 was initiated and I then injected subcutaneous lidocaine to the overlying L5-S1 site after its fluoroscopic identifictation.  Using strict aseptic technique, I then advanced an 18-gauge Tuohy epidural needle in the midline using interlaminar approach via loss-of-resistance to saline technique.  I was unable to achieve loss resistant on the right L5-S1 site without subsequent pain with the right side pressure paresthesia sensation.  This was aborted and I chose to proceed with a L4-5 approach.  There was negative aspiration for heme or  CSF.  I then confirmed position with both AP and Lateral fluoroscan.  2 cc of Isovue were injected and a  total of 5 mL of Preservative-Free normal saline mixed with 40 mg of Kenalog and 1cc Ropicaine 0.2 percent were injected incrementally via the  epidurally placed needle. The needle was removed. The patient tolerated the injection well and was convalesced and discharged to home in stable condition. Should the patient have any post procedure difficulty they have been instructed on how to contact us for assistance.    Yevette Edwards, MD

## 2017-08-29 NOTE — Progress Notes (Signed)
Safety precautions to be maintained throughout the outpatient stay will include: orient to surroundings, keep bed in low position, maintain call bell within reach at all times, provide assistance with transfer out of bed and ambulation.  

## 2017-08-30 ENCOUNTER — Telehealth: Payer: Self-pay | Admitting: *Deleted

## 2017-08-30 NOTE — Telephone Encounter (Signed)
Attempted to call for post procedure follow-up. Message left. 

## 2017-09-19 ENCOUNTER — Ambulatory Visit (INDEPENDENT_AMBULATORY_CARE_PROVIDER_SITE_OTHER): Payer: Medicare Other | Admitting: Family Medicine

## 2017-09-19 ENCOUNTER — Encounter: Payer: Self-pay | Admitting: Family Medicine

## 2017-09-19 VITALS — BP 118/72 | HR 87 | Temp 98.0°F | Wt 157.8 lb

## 2017-09-19 DIAGNOSIS — M62838 Other muscle spasm: Secondary | ICD-10-CM

## 2017-09-19 DIAGNOSIS — M4802 Spinal stenosis, cervical region: Secondary | ICD-10-CM | POA: Diagnosis not present

## 2017-09-19 MED ORDER — BACLOFEN 20 MG PO TABS: 20.0000 mg | ORAL_TABLET | Freq: Every day | ORAL | 0 refills | Status: DC

## 2017-09-19 MED ORDER — TRIAMCINOLONE ACETONIDE 40 MG/ML IJ SUSP
40.0000 mg | Freq: Once | INTRAMUSCULAR | Status: AC
Start: 2017-09-19 — End: 2017-09-19
  Administered 2017-09-19: 40 mg via INTRAMUSCULAR

## 2017-09-19 MED ORDER — MELOXICAM 15 MG PO TABS
15.0000 mg | ORAL_TABLET | Freq: Every day | ORAL | 0 refills | Status: DC
Start: 2017-09-19 — End: 2017-11-13

## 2017-09-19 NOTE — Progress Notes (Signed)
BP 118/72 (BP Location: Right Arm, Patient Position: Sitting, Cuff Size: Normal)   Pulse 87   Temp 98 F (36.7 C) (Oral)   Wt 157 lb 12.8 oz (71.6 kg)   LMP 11/28/1994 (Approximate)   SpO2 98%   BMI 30.82 kg/m    Subjective:    Patient ID: Ellen [name], female    DOB: 1941-03-15, 77 y.o.   MRN: [id number]  HPI: Ellen [name] is a 77 y.o. female  Chief Complaint  Patient presents with  . Neck Pain    Patient unable to sleep. Goes back to Dr. Pernell Dupre in 2 weeks. Radiating to shoulder.   . Shoulder Pain   Pt here with about a week of severe neck pain and muscle spasms. Getting shots with pain management which seems to help but now having the left side flared up and doesn't get back with them for another 2 weeks for next injections. Trying heat, OTC pain relievers with no relief. Denies known injury, radiculopathy.   Relevant past medical, surgical, family and social history reviewed and updated as indicated. Interim medical history since our last visit reviewed. Allergies and medications reviewed and updated.  Review of Systems  Per HPI unless specifically indicated above     Objective:    BP 118/72 (BP Location: Right Arm, Patient Position: Sitting, Cuff Size: Normal)   Pulse 87   Temp 98 F (36.7 C) (Oral)   Wt 157 lb 12.8 oz (71.6 kg)   LMP 11/28/1994 (Approximate)   SpO2 98%   BMI 30.82 kg/m   Wt Readings from Last 3 Encounters:  09/19/17 157 lb 12.8 oz (71.6 kg)  08/29/17 155 lb (70.3 kg)  07/26/17 156 lb (70.8 kg)    Physical Exam  Constitutional: She is oriented to person, place, and time. She appears well-developed and well-nourished.  HENT:  Head: Atraumatic.  Eyes: Pupils are equal, round, and reactive to light. Conjunctivae are normal. No scleral icterus.  Neck: Normal range of motion. Neck supple.  Cardiovascular: Normal rate and normal heart sounds.  Pulmonary/Chest: Effort normal and breath sounds normal.  Musculoskeletal: Normal range of  motion. She exhibits tenderness (tenderness and spasm in left trapezius). She exhibits no edema.  Neurological: She is alert and oriented to person, place, and time.  Skin: Skin is warm and dry.  Psychiatric: She has a normal mood and affect. Her behavior is normal.  Nursing note and vitals reviewed.   Results for orders placed or performed in visit on 06/14/17  UA/M w/rflx Culture, Routine  Result Value Ref Range   Specific Gravity, UA 1.020 1.005 - 1.030   pH, UA 6.5 5.0 - 7.5   Color, UA Yellow Yellow   Appearance Ur Clear Clear   Leukocytes, UA Negative Negative   Protein, UA Negative Negative/Trace   Glucose, UA Negative Negative   Ketones, UA Negative Negative   RBC, UA Negative Negative   Bilirubin, UA Negative Negative   Urobilinogen, Ur 0.2 0.2 - 1.0 mg/dL   Nitrite, UA Negative Negative      Assessment & Plan:   Problem List Items Addressed This Visit    None    Visit Diagnoses    Cervical stenosis of spine    -  Primary   Awaiting next injection with Pain Mgmt, will give IM kenalog and baclofen to help until then. Meloxicam, heat, rest   Relevant Medications   triamcinolone acetonide (KENALOG-40) injection 40 mg (Completed)   Muscle spasms of neck  Will tx with baclofen, IM kenalog, heat, muscle rubs, tylenol prn. Follow up with Pain Management as schduled for injections, sooner if no improvement        Follow up plan: Return for Pain Mgmt as scheduled.

## 2017-09-19 NOTE — Patient Instructions (Signed)
You've gotten a shot of kenalog today, which is a steroid to help with the inflammation You are being given a prescription for meloxicam (which is related to ibuprofen, aleve, advil, etc) for pain and inflammation as well as the baclofen (which is a muscle relaxer) to use as needed.  The meloxicam is to be taken once daily as needed The baclofen is to be taken up to three times daily as needed, but remember that it can make you sleepy so no driving while taking it. Great for taking at bedtime.

## 2017-10-03 ENCOUNTER — Ambulatory Visit: Payer: Medicare Other | Attending: Anesthesiology | Admitting: Anesthesiology

## 2017-10-03 ENCOUNTER — Other Ambulatory Visit: Payer: Self-pay | Admitting: Anesthesiology

## 2017-10-03 ENCOUNTER — Ambulatory Visit
Admission: RE | Admit: 2017-10-03 | Discharge: 2017-10-03 | Disposition: A | Discharge status: Home / Self Care | Payer: Medicare Other | Source: Ambulatory Visit | Attending: Anesthesiology | Admitting: Anesthesiology

## 2017-10-03 ENCOUNTER — Other Ambulatory Visit: Payer: Self-pay

## 2017-10-03 ENCOUNTER — Encounter: Payer: Self-pay | Admitting: Anesthesiology

## 2017-10-03 VITALS — BP 123/77 | HR 94 | Temp 98.7°F | Resp 18 | Ht 62.0 in | Wt 155.0 lb

## 2017-10-03 DIAGNOSIS — M5136 Other intervertebral disc degeneration, lumbar region: Secondary | ICD-10-CM | POA: Diagnosis not present

## 2017-10-03 DIAGNOSIS — M48062 Spinal stenosis, lumbar region with neurogenic claudication: Secondary | ICD-10-CM | POA: Insufficient documentation

## 2017-10-03 DIAGNOSIS — M549 Dorsalgia, unspecified: Secondary | ICD-10-CM | POA: Diagnosis not present

## 2017-10-03 DIAGNOSIS — R52 Pain, unspecified: Secondary | ICD-10-CM

## 2017-10-03 DIAGNOSIS — M4716 Other spondylosis with myelopathy, lumbar region: Secondary | ICD-10-CM | POA: Diagnosis not present

## 2017-10-03 DIAGNOSIS — M5432 Sciatica, left side: Secondary | ICD-10-CM

## 2017-10-03 MED ORDER — ROPIVACAINE HCL 2 MG/ML IJ SOLN
10.0000 mL | Freq: Once | INTRAMUSCULAR | Status: AC
  Administered 2017-10-03: 10 mL via EPIDURAL

## 2017-10-03 MED ORDER — SODIUM CHLORIDE 0.9 % IJ SOLN
INTRAMUSCULAR | Status: AC
  Filled 2017-10-03: qty 10

## 2017-10-03 MED ORDER — TRIAMCINOLONE ACETONIDE 40 MG/ML IJ SUSP
40.0000 mg | Freq: Once | INTRAMUSCULAR | Status: AC
  Administered 2017-10-03: 40 mg

## 2017-10-03 MED ORDER — LIDOCAINE HCL (PF) 1 % IJ SOLN
INTRAMUSCULAR | Status: AC
  Filled 2017-10-03: qty 5

## 2017-10-03 MED ORDER — SODIUM CHLORIDE 0.9% FLUSH: 10.0000 mL | Freq: Once | INTRAVENOUS | Status: DC

## 2017-10-03 MED ORDER — ROPIVACAINE HCL 2 MG/ML IJ SOLN
INTRAMUSCULAR | Status: AC
  Filled 2017-10-03: qty 10

## 2017-10-03 MED ORDER — IOPAMIDOL (ISOVUE-M 200) INJECTION 41%
20.0000 mL | Freq: Once | INTRAMUSCULAR | Status: DC | PRN
  Administered 2017-10-03: 10 mL
  Filled 2017-10-03: qty 20

## 2017-10-03 MED ORDER — LIDOCAINE HCL (PF) 1 % IJ SOLN
5.0000 mL | Freq: Once | INTRAMUSCULAR | Status: AC
  Administered 2017-10-03: 5 mL via SUBCUTANEOUS

## 2017-10-03 MED ORDER — TRIAMCINOLONE ACETONIDE 40 MG/ML IJ SUSP
INTRAMUSCULAR | Status: AC
  Filled 2017-10-03: qty 1

## 2017-10-03 MED ORDER — IOPAMIDOL (ISOVUE-M 200) INJECTION 41%
INTRAMUSCULAR | Status: AC
  Filled 2017-10-03: qty 10

## 2017-10-03 NOTE — Patient Instructions (Signed)
Pain Management Discharge Instructions  General Discharge Instructions :  If you need to reach your doctor call: Monday-Friday 8:00 am - 4:00 pm at [phone] or toll free [phone].  After clinic hours [phone] to have operator reach doctor.  Bring all of your medication bottles to all your appointments in the pain clinic.  To cancel or reschedule your appointment with Pain Management please remember to call 24 hours in advance to avoid a fee.  Refer to the educational materials which you have been given on: General Risks, I had my Procedure. Discharge Instructions, Post Sedation.  Post Procedure Instructions:  Please notify your doctor immediately if you have any unusual bleeding, trouble breathing or pain that is not related to your normal pain.  Depending on the type of procedure that was done, some parts of your body may feel week and/or numb.  This usually clears up by tonight or the next day.  Walk with the use of an assistive device or accompanied by an adult for the 24 hours.  You may use ice on the affected area for the first 24 hours.  Put ice in a Ziploc bag and cover with a towel and place against area 15 minutes on 15 minutes off.  You may switch to heat after 24 hours. 

## 2017-10-03 NOTE — Progress Notes (Signed)
Subjective:  Patient ID: Ellen [name], female    DOB: 1941-05-26  Age: 77 y.o. MRN: [id number]  CC: Back Pain (low) and Leg Pain (left)   Procedure: L5-S1 #3 epidural steroid under fluoroscopic guidance with no sedation  HPI Ellen [name] presents for reevaluation.  She was last seen several weeks ago and has responded favorably to her epidural steroid course.  She reports that her low back pain is approximately 75% better.  Her right lower leg pain has diminished by 75-80% but she is having some significant left side leg pain with calf cramping.  Otherwise no change in strength or bowel or bladder function is noted at this time.  The quality characteristic distribution of the pain has been stable in nature.  Outpatient Medications Prior to Visit  Medication Sig Dispense Refill  . amLODipine (NORVASC) 5 MG tablet Take 1 tablet (5 mg total) by mouth daily. 90 tablet 1  . aspirin 81 MG tablet Take 81 mg by mouth daily.    Marland Kitchen. atorvastatin (LIPITOR) 20 MG tablet Take 1 tablet (20 mg total) by mouth daily. 90 tablet 3  . baclofen (LIORESAL) 20 MG tablet Take 1 tablet (20 mg total) by mouth at bedtime. 30 each 0  . Cholecalciferol (VITAMIN D3) 1000 units CAPS Take 1 capsule by mouth daily.    Marland Kitchen. lisinopril-hydrochlorothiazide (PRINZIDE,ZESTORETIC) 20-25 MG tablet Take 1 tablet by mouth daily. 90 tablet 3  . Magnesium 500 MG TABS Take 1 tablet by mouth daily.    . Omega-3 1000 MG CAPS Take by mouth daily.    . meloxicam (MOBIC) 15 MG tablet Take 1 tablet (15 mg total) by mouth daily. (Patient not taking: Reported on 10/03/2017) 30 tablet 0   No facility-administered medications prior to visit.     Review of Systems CNS: No confusion or sedation Cardiac: No angina or palpitations GI: No abdominal pain or constipation Constitutional: No nausea vomiting fevers or chills  Objective:  BP 123/77   Pulse 94   Temp 98.7 F (37.1 C) (Oral)   Resp 18   Ht 5\' 2"  (1.575 m)   Wt 155 lb  (70.3 kg)   LMP 11/28/1994 (Approximate)   SpO2 96%   BMI 28.35 kg/m    BP Readings from Last 3 Encounters:  10/03/17 123/77  09/19/17 118/72  08/29/17 (!) 159/74     Wt Readings from Last 3 Encounters:  10/03/17 155 lb (70.3 kg)  09/19/17 157 lb 12.8 oz (71.6 kg)  08/29/17 155 lb (70.3 kg)     Physical Exam Pt is alert and oriented PERRL EOMI HEART IS RRR no murmur or rub LCTA no wheezing or rales MUSCULOSKELETAL reveals some paraspinous muscle tenderness but no overt trigger points.  Her muscle tone and bulk is at baseline.  Labs  Lab Results  Component Value Date   HGBA1C 6.2 (H) 04/17/2017   Lab Results  Component Value Date   MICROALBUR 30 (H) 10/02/2016   LDLCALC 97 04/17/2017   CREATININE 0.73 04/17/2017    -------------------------------------------------------------------------------------------------------------------- Lab Results  Component Value Date   GLUCOSE 101 (H) 04/17/2017   CHOL 174 04/17/2017   TRIG 121 04/17/2017   HDL 53 04/17/2017   LDLCALC 97 04/17/2017   ALT 17 04/17/2017   AST 20 04/17/2017   NA 144 04/17/2017   K 3.8 04/17/2017   CL 102 04/17/2017   CREATININE 0.73 04/17/2017   BUN 11 04/17/2017   CO2 29 04/17/2017   HGBA1C 6.2 (H) 04/17/2017  MICROALBUR 30 (H) 10/02/2016    --------------------------------------------------------------------------------------------------------------------- Dg C-arm 1-60 Min-no Report  Result Date: 08/29/2017 Fluoroscopy was utilized by the requesting physician.  No radiographic interpretation.     Assessment & Plan:   [name] was seen today for back pain and leg pain.  Diagnoses and all orders for this visit:  Spinal stenosis of lumbar region with neurogenic claudication -     triamcinolone acetonide (KENALOG-40) injection 40 mg -     sodium chloride flush (NS) 0.9 % injection 10 mL -     ropivacaine (PF) 2 mg/mL (0.2%) (NAROPIN) injection 10 mL -     lidocaine (PF) (XYLOCAINE) 1  % injection 5 mL -     iopamidol (ISOVUE-M) 41 % intrathecal injection 20 mL  Mid back pain on right side  Sciatica of left side -     triamcinolone acetonide (KENALOG-40) injection 40 mg -     sodium chloride flush (NS) 0.9 % injection 10 mL -     ropivacaine (PF) 2 mg/mL (0.2%) (NAROPIN) injection 10 mL -     lidocaine (PF) (XYLOCAINE) 1 % injection 5 mL -     iopamidol (ISOVUE-M) 41 % intrathecal injection 20 mL  DDD (degenerative disc disease), lumbar  Spondylosis, lumbar, with myelopathy -     triamcinolone acetonide (KENALOG-40) injection 40 mg -     sodium chloride flush (NS) 0.9 % injection 10 mL -     ropivacaine (PF) 2 mg/mL (0.2%) (NAROPIN) injection 10 mL -     lidocaine (PF) (XYLOCAINE) 1 % injection 5 mL -     iopamidol (ISOVUE-M) 41 % intrathecal injection 20 mL        ----------------------------------------------------------------------------------------------------------------------  Problem List Items Addressed This Visit      Unprioritized   Mid back pain on right side   Relevant Medications   triamcinolone acetonide (KENALOG-40) injection 40 mg (Completed)    Other Visit Diagnoses    Spinal stenosis of lumbar region with neurogenic claudication    -  Primary   Relevant Medications   triamcinolone acetonide (KENALOG-40) injection 40 mg (Completed)   sodium chloride flush (NS) 0.9 % injection 10 mL   ropivacaine (PF) 2 mg/mL (0.2%) (NAROPIN) injection 10 mL (Completed)   lidocaine (PF) (XYLOCAINE) 1 % injection 5 mL (Completed)   iopamidol (ISOVUE-M) 41 % intrathecal injection 20 mL   Sciatica of left side       Relevant Medications   triamcinolone acetonide (KENALOG-40) injection 40 mg (Completed)   sodium chloride flush (NS) 0.9 % injection 10 mL   ropivacaine (PF) 2 mg/mL (0.2%) (NAROPIN) injection 10 mL (Completed)   lidocaine (PF) (XYLOCAINE) 1 % injection 5 mL (Completed)   iopamidol (ISOVUE-M) 41 % intrathecal injection 20 mL   DDD  (degenerative disc disease), lumbar       Relevant Medications   triamcinolone acetonide (KENALOG-40) injection 40 mg (Completed)   Spondylosis, lumbar, with myelopathy       Relevant Medications   triamcinolone acetonide (KENALOG-40) injection 40 mg (Completed)   sodium chloride flush (NS) 0.9 % injection 10 mL   ropivacaine (PF) 2 mg/mL (0.2%) (NAROPIN) injection 10 mL (Completed)   lidocaine (PF) (XYLOCAINE) 1 % injection 5 mL (Completed)   iopamidol (ISOVUE-M) 41 % intrathecal injection 20 mL        ----------------------------------------------------------------------------------------------------------------------  1. Spinal stenosis of lumbar region with neurogenic claudication We will proceed with a third lumbar epidural steroid today.  We will direct this for better left  side coverage if possible and have her return to clinic in 2 months for reevaluation.  The risks and benefits of the procedure been once again reviewed with her in full detail.  Of also had a long discussion with her regarding aerobic conditioning and stretching strengthening exercises and their importance. - triamcinolone acetonide (KENALOG-40) injection 40 mg - sodium chloride flush (NS) 0.9 % injection 10 mL - ropivacaine (PF) 2 mg/mL (0.2%) (NAROPIN) injection 10 mL - lidocaine (PF) (XYLOCAINE) 1 % injection 5 mL - iopamidol (ISOVUE-M) 41 % intrathecal injection 20 mL  2. Mid back pain on right side   3. Sciatica of left side  - triamcinolone acetonide (KENALOG-40) injection 40 mg - sodium chloride flush (NS) 0.9 % injection 10 mL - ropivacaine (PF) 2 mg/mL (0.2%) (NAROPIN) injection 10 mL - lidocaine (PF) (XYLOCAINE) 1 % injection 5 mL - iopamidol (ISOVUE-M) 41 % intrathecal injection 20 mL  4. DDD (degenerative disc disease), lumbar   5. Spondylosis, lumbar, with myelopathy As above - triamcinolone acetonide (KENALOG-40) injection 40 mg - sodium chloride flush (NS) 0.9 % injection 10 mL -  ropivacaine (PF) 2 mg/mL (0.2%) (NAROPIN) injection 10 mL - lidocaine (PF) (XYLOCAINE) 1 % injection 5 mL - iopamidol (ISOVUE-M) 41 % intrathecal injection 20 mL    ----------------------------------------------------------------------------------------------------------------------  I am having Saadia E. Manlove maintain her aspirin, Vitamin D3, Magnesium, lisinopril-hydrochlorothiazide, amLODipine, atorvastatin, Omega-3, meloxicam, and baclofen. We administered triamcinolone acetonide, ropivacaine (PF) 2 mg/mL (0.2%), lidocaine (PF), and iopamidol.   Meds ordered this encounter  Medications  . triamcinolone acetonide (KENALOG-40) injection 40 mg  . sodium chloride flush (NS) 0.9 % injection 10 mL  . ropivacaine (PF) 2 mg/mL (0.2%) (NAROPIN) injection 10 mL  . lidocaine (PF) (XYLOCAINE) 1 % injection 5 mL  . iopamidol (ISOVUE-M) 41 % intrathecal injection 20 mL   Patient's Medications  New Prescriptions   No medications on file  Previous Medications   AMLODIPINE (NORVASC) 5 MG TABLET    Take 1 tablet (5 mg total) by mouth daily.   ASPIRIN 81 MG TABLET    Take 81 mg by mouth daily.   ATORVASTATIN (LIPITOR) 20 MG TABLET    Take 1 tablet (20 mg total) by mouth daily.   BACLOFEN (LIORESAL) 20 MG TABLET    Take 1 tablet (20 mg total) by mouth at bedtime.   CHOLECALCIFEROL (VITAMIN D3) 1000 UNITS CAPS    Take 1 capsule by mouth daily.   LISINOPRIL-HYDROCHLOROTHIAZIDE (PRINZIDE,ZESTORETIC) 20-25 MG TABLET    Take 1 tablet by mouth daily.   MAGNESIUM 500 MG TABS    Take 1 tablet by mouth daily.   MELOXICAM (MOBIC) 15 MG TABLET    Take 1 tablet (15 mg total) by mouth daily.   OMEGA-3 1000 MG CAPS    Take by mouth daily.  Modified Medications   No medications on file  Discontinued Medications   No medications on file   ----------------------------------------------------------------------------------------------------------------------  Follow-up: Return for evaluation.  Procedure:  L5-S1 epidural steroid under fluoroscopic guidance with no sedation   Procedure: L5-S1 LESI with fluoroscopic guidance and moderate sedation  NOTE: The risks, benefits, and expectations of the procedure have been discussed and explained to the patient who was understanding and in agreement with suggested treatment plan. No guarantees were made.  DESCRIPTION OF PROCEDURE: Lumbar epidural steroid injection with no IV Versed, EKG, blood pressure, pulse, and pulse oximetry monitoring. The procedure was performed with the patient in the prone position under fluoroscopic guidance.  Sterile  prep x3 was initiated and I then injected subcutaneous lidocaine to the overlying 5 S1 site after its fluoroscopic identifictation.  Using strict aseptic technique, I then advanced an 18-gauge Tuohy epidural needle in the midline using interlaminar approach via loss-of-resistance to saline technique. There was negative aspiration for heme or  CSF.  I then confirmed position with both AP and Lateral fluoroscan.  2 cc of Isovue were injected and a  total of 5 mL of Preservative-Free normal saline mixed with 40 mg of Kenalog and 1cc Ropicaine 0.2 percent were injected incrementally via the  epidurally placed needle. The needle was removed. The patient tolerated the injection well and was convalesced and discharged to home in stable condition. Should the patient have any post procedure difficulty they have been instructed on how to contact us for assistance.    Yevette Edwards, MD

## 2017-10-03 NOTE — Progress Notes (Signed)
Safety precautions to be maintained throughout the outpatient stay will include: orient to surroundings, keep bed in low position, maintain call bell within reach at all times, provide assistance with transfer out of bed and ambulation.  

## 2017-11-13 ENCOUNTER — Ambulatory Visit: Payer: Medicare Other | Admitting: Unknown Physician Specialty

## 2017-11-13 ENCOUNTER — Encounter: Payer: Self-pay | Admitting: Unknown Physician Specialty

## 2017-11-13 DIAGNOSIS — E785 Hyperlipidemia, unspecified: Secondary | ICD-10-CM

## 2017-11-13 DIAGNOSIS — M545 Low back pain, unspecified: Secondary | ICD-10-CM

## 2017-11-13 DIAGNOSIS — G8929 Other chronic pain: Secondary | ICD-10-CM | POA: Diagnosis not present

## 2017-11-13 DIAGNOSIS — I10 Essential (primary) hypertension: Secondary | ICD-10-CM | POA: Diagnosis not present

## 2017-11-13 DIAGNOSIS — I700 Atherosclerosis of aorta: Secondary | ICD-10-CM | POA: Diagnosis not present

## 2017-11-13 MED ORDER — LISINOPRIL-HYDROCHLOROTHIAZIDE 20-25 MG PO TABS: 1.0000 | ORAL_TABLET | Freq: Every day | ORAL | 3 refills | Status: DC

## 2017-11-13 MED ORDER — AMLODIPINE BESYLATE 5 MG PO TABS: 5.0000 mg | ORAL_TABLET | Freq: Every day | ORAL | 1 refills | Status: DC

## 2017-11-13 MED ORDER — ATORVASTATIN CALCIUM 20 MG PO TABS: 20.0000 mg | ORAL_TABLET | Freq: Every day | ORAL | 3 refills | Status: DC

## 2017-11-13 NOTE — Assessment & Plan Note (Signed)
Stable, continue present medications.   

## 2017-11-13 NOTE — Assessment & Plan Note (Addendum)
As noted on chest x-ray.  Restart Atorvastatin 20 mg

## 2017-11-13 NOTE — Assessment & Plan Note (Signed)
Doing well at the pain clinc

## 2017-11-13 NOTE — Progress Notes (Signed)
BP 129/82 (BP Location: Left Arm, Cuff Size: Normal)   Pulse 88   Temp 98.3 F (36.8 C) (Oral)   Ht  (1.6 m)   Wt 162 lb 1.6 oz (73.5 kg)   LMP 11/28/1994 (Approximate)   SpO2 98%   BMI 28.71 kg/m    Subjective:    Patient ID: Ellen [name], female    DOB: 10/15/1940, 77 y.o.   MRN: [id number]  HPI: Ellen [name] is a 77 y.o. female  Chief Complaint  Patient presents with  . Hyperlipidemia  . Hypertension   Hypertension Using medications without difficulty Average home BPs below 130/80  No problems or lightheadedness No chest pain with exertion or shortness of breath No Edema   Hyperlipidemia Noted atherosclerosis on chest x-ray.   Atorvastatin and unable to get them refilled.  Out since March Used medications without problems No Muscle aches  Diet compliance:Exercise:Feeling she is doing well.  [place] and doing exercising  Back pain Going to the pain clinic and feels back is doing well.    Relevant past medical, surgical, family and social history reviewed and updated as indicated. Interim medical history since our last visit reviewed. Allergies and medications reviewed and updated.  Review of Systems  Per HPI unless specifically indicated above     Objective:    BP 129/82 (BP Location: Left Arm, Cuff Size: Normal)   Pulse 88   Temp 98.3 F (36.8 C) (Oral)   Ht  (1.6 m)   Wt 162 lb 1.6 oz (73.5 kg)   LMP 11/28/1994 (Approximate)   SpO2 98%   BMI 28.71 kg/m   Wt Readings from Last 3 Encounters:  11/13/17 162 lb 1.6 oz (73.5 kg)  10/03/17 155 lb (70.3 kg)  09/19/17 157 lb 12.8 oz (71.6 kg)    Physical Exam  Constitutional: She is oriented to person, place, and time. She appears well-developed and well-nourished. No distress.  HENT:  Head: Normocephalic and atraumatic.  Eyes: Conjunctivae and lids are normal. Right eye exhibits no discharge. Left eye exhibits no discharge. No scleral icterus.  Neck: Normal range of motion. Neck  supple. No JVD present. Carotid bruit is not present.  Cardiovascular: Normal rate, regular rhythm and normal heart sounds.  Pulmonary/Chest: Effort normal and breath sounds normal.  Abdominal: Normal appearance. There is no splenomegaly or hepatomegaly.  Musculoskeletal: Normal range of motion.  Neurological: She is alert and oriented to person, place, and time.  Skin: Skin is warm, dry and intact. No rash noted. No pallor.  Psychiatric: She has a normal mood and affect. Her behavior is normal. Judgment and thought content normal.    Results for orders placed or performed in visit on 06/14/17  UA/M w/rflx Culture, Routine  Result Value Ref Range   Specific Gravity, UA 1.020 1.005 - 1.030   pH, UA 6.5 5.0 - 7.5   Color, UA Yellow Yellow   Appearance Ur Clear Clear   Leukocytes, UA Negative Negative   Protein, UA Negative Negative/Trace   Glucose, UA Negative Negative   Ketones, UA Negative Negative   RBC, UA Negative Negative   Bilirubin, UA Negative Negative   Urobilinogen, Ur 0.2 0.2 - 1.0 mg/dL   Nitrite, UA Negative Negative      Assessment & Plan:   Problem List Items Addressed This Visit      Unprioritized   Atherosclerosis of aorta (HCC)    As noted on chest x-ray.  Restart Atorvastatin 20 mg  Relevant Medications   atorvastatin (LIPITOR) 20 MG tablet   amLODipine (NORVASC) 5 MG tablet   lisinopril-hydrochlorothiazide (PRINZIDE,ZESTORETIC) 20-25 MG tablet   Back pain    Doing well at the pain clinc      Hypertension    Stable, continue present medications.        Relevant Medications   atorvastatin (LIPITOR) 20 MG tablet   amLODipine (NORVASC) 5 MG tablet   lisinopril-hydrochlorothiazide (PRINZIDE,ZESTORETIC) 20-25 MG tablet    Other Visit Diagnoses    Hyperlipidemia, unspecified hyperlipidemia type       Relevant Medications   atorvastatin (LIPITOR) 20 MG tablet   amLODipine (NORVASC) 5 MG tablet   lisinopril-hydrochlorothiazide  (PRINZIDE,ZESTORETIC) 20-25 MG tablet       Follow up plan: Return for physical in October.

## 2018-04-10 ENCOUNTER — Ambulatory Visit: Payer: Self-pay

## 2018-04-15 ENCOUNTER — Ambulatory Visit (INDEPENDENT_AMBULATORY_CARE_PROVIDER_SITE_OTHER): Payer: Medicare Other

## 2018-04-15 VITALS — BP 126/76 | HR 80 | Temp 98.0°F | Ht 61.5 in | Wt 160.5 lb

## 2018-04-15 DIAGNOSIS — Z1231 Encounter for screening mammogram for malignant neoplasm of breast: Secondary | ICD-10-CM | POA: Diagnosis not present

## 2018-04-15 DIAGNOSIS — Z23 Encounter for immunization: Secondary | ICD-10-CM

## 2018-04-15 DIAGNOSIS — Z0000 Encounter for general adult medical examination without abnormal findings: Secondary | ICD-10-CM | POA: Diagnosis not present

## 2018-04-15 NOTE — Patient Instructions (Addendum)
Ellen [name] , Thank you for taking time to come for your Medicare Wellness Visit. I appreciate your ongoing commitment to your health goals. Please review the following plan we discussed and let me know if I can assist you in the future.   Screening recommendations/referrals: Colonoscopy: Up to date   Mammogram: Please call [phone] to schedule your mammogram.  Bone Density: Up to date Recommended yearly ophthalmology/optometry visit for glaucoma screening and checkup Recommended yearly dental visit for hygiene and checkup  Vaccinations:  Influenza vaccine: done today Pneumococcal vaccine: Up to date  Tdap vaccine: Up to date Shingles vaccine: Shingarix discussed. Please contact your pharmacy for additional information.    Advanced directives: Advance directive discussed with you today. I have provided a copy for you to complete at home and have notarized. Once this is complete please bring a copy in to our office so we can scan it into your chart.  Conditions/risks identified: recommend drinking 6-8 glasses of water per day  Next appointment: 04/19/18 2:00 Ellen [name]   Preventive Care 75 Years and Older, Female Preventive care refers to lifestyle choices and visits with your health care provider that can promote health and wellness. What does preventive care include?  A yearly physical exam. This is also called an annual well check.  Dental exams once or twice a year.  Routine eye exams. Ask your health care provider how often you should have your eyes checked.  Personal lifestyle choices, including:  Daily care of your teeth and gums.  Regular physical activity.  Eating a healthy diet.  Avoiding tobacco and drug use.  Limiting alcohol use.  Practicing safe sex.  Taking low-dose aspirin every day.  Taking vitamin and mineral supplements as recommended by your health care provider. What happens during an annual well check? The services and screenings done  by your health care provider during your annual well check will depend on your age, overall health, lifestyle risk factors, and family history of disease. Counseling  Your health care provider may ask you questions about your:  Alcohol use.  Tobacco use.  Drug use.  Emotional well-being.  Home and relationship well-being.  Sexual activity.  Eating habits.  History of falls.  Memory and ability to understand (cognition).  Work and work Astronomer.  Reproductive health. Screening  You may have the following tests or measurements:  Height, weight, and BMI.  Blood pressure.  Lipid and cholesterol levels. These may be checked every 5 years, or more frequently if you are over 75 years old.  Skin check.  Lung cancer screening. You may have this screening every year starting at age 75 if you have a 30-pack-year history of smoking and currently smoke or have quit within the past 15 years.  Fecal occult blood test (FOBT) of the stool. You may have this test every year starting at age 75.  Flexible sigmoidoscopy or colonoscopy. You may have a sigmoidoscopy every 5 years or a colonoscopy every 10 years starting at age 75.  Hepatitis C blood test.  Hepatitis B blood test.  Sexually transmitted disease (STD) testing.  Diabetes screening. This is done by checking your blood sugar (glucose) after you have not eaten for a while (fasting). You may have this done every 1-3 years.  Bone density scan. This is done to screen for osteoporosis. You may have this done starting at age 55.  Mammogram. This may be done every 1-2 years. Talk to your health care provider about how often you should  have regular mammograms. Talk with your health care provider about your test results, treatment options, and if necessary, the need for more tests. Vaccines  Your health care provider may recommend certain vaccines, such as:  Influenza vaccine. This is recommended every year.  Tetanus,  diphtheria, and acellular pertussis (Tdap, Td) vaccine. You may need a Td booster every 10 years.  Zoster vaccine. You may need this after age 34.  Pneumococcal 13-valent conjugate (PCV13) vaccine. One dose is recommended after age 30.  Pneumococcal polysaccharide (PPSV23) vaccine. One dose is recommended after age 55. Talk to your health care provider about which screenings and vaccines you need and how often you need them. This information is not intended to replace advice given to you by your health care provider. Make sure you discuss any questions you have with your health care provider. Document Released: 07/02/2015 Document Revised: 02/23/2016 Document Reviewed: 04/06/2015 Elsevier Interactive Patient Education  2017 [place] Prevention in the Home Falls can cause injuries. They can happen to people of all ages. There are many things you can do to make your home safe and to help prevent falls. What can I do on the outside of my home?  Regularly fix the edges of walkways and driveways and fix any cracks.  Remove anything that might make you trip as you walk through a door, such as a raised step or threshold.  Trim any bushes or trees on the path to your home.  Use bright outdoor lighting.  Clear any walking paths of anything that might make someone trip, such as rocks or tools.  Regularly check to see if handrails are loose or broken. Make sure that both sides of any steps have handrails.  Any raised decks and porches should have guardrails on the edges.  Have any leaves, snow, or ice cleared regularly.  Use sand or salt on walking paths during winter.  Clean up any spills in your garage right away. This includes oil or grease spills. What can I do in the bathroom?  Use night lights.  Install grab bars by the toilet and in the tub and shower. Do not use towel bars as grab bars.  Use non-skid mats or decals in the tub or shower.  If you need to sit down in  the shower, use a plastic, non-slip stool.  Keep the floor dry. Clean up any water that spills on the floor as soon as it happens.  Remove soap buildup in the tub or shower regularly.  Attach bath mats securely with double-sided non-slip rug tape.  Do not have throw rugs and other things on the floor that can make you trip. What can I do in the bedroom?  Use night lights.  Make sure that you have a light by your bed that is easy to reach.  Do not use any sheets or blankets that are too big for your bed. They should not hang down onto the floor.  Have a firm chair that has side arms. You can use this for support while you get dressed.  Do not have throw rugs and other things on the floor that can make you trip. What can I do in the kitchen?  Clean up any spills right away.  Avoid walking on wet floors.  Keep items that you use a lot in easy-to-reach places.  If you need to reach something above you, use a strong step stool that has a grab bar.  Keep electrical cords out of  the way.  Do not use floor polish or wax that makes floors slippery. If you must use wax, use non-skid floor wax.  Do not have throw rugs and other things on the floor that can make you trip. What can I do with my stairs?  Do not leave any items on the stairs.  Make sure that there are handrails on both sides of the stairs and use them. Fix handrails that are broken or loose. Make sure that handrails are as long as the stairways.  Check any carpeting to make sure that it is firmly attached to the stairs. Fix any carpet that is loose or worn.  Avoid having throw rugs at the top or bottom of the stairs. If you do have throw rugs, attach them to the floor with carpet tape.  Make sure that you have a light switch at the top of the stairs and the bottom of the stairs. If you do not have them, ask someone to add them for you. What else can I do to help prevent falls?  Wear shoes that:  Do not have high  heels.  Have rubber bottoms.  Are comfortable and fit you well.  Are closed at the toe. Do not wear sandals.  If you use a stepladder:  Make sure that it is fully opened. Do not climb a closed stepladder.  Make sure that both sides of the stepladder are locked into place.  Ask someone to hold it for you, if possible.  Clearly mark and make sure that you can see:  Any grab bars or handrails.  First and last steps.  Where the edge of each step is.  Use tools that help you move around (mobility aids) if they are needed. These include:  Canes.  Walkers.  Scooters.  Crutches.  Turn on the lights when you go into a dark area. Replace any light bulbs as soon as they burn out.  Set up your furniture so you have a clear path. Avoid moving your furniture around.  If any of your floors are uneven, fix them.  If there are any pets around you, be aware of where they are.  Review your medicines with your doctor. Some medicines can make you feel dizzy. This can increase your chance of falling. Ask your doctor what other things that you can do to help prevent falls. This information is not intended to replace advice given to you by your health care provider. Make sure you discuss any questions you have with your health care provider. Document Released: 04/01/2009 Document Revised: 11/11/2015 Document Reviewed: 07/10/2014 Elsevier Interactive Patient Education  2017 [employer]  Influenza (Flu) Vaccine (Inactivated or Recombinant): What You Need to Know 1. Why get vaccinated? Influenza ("flu") is a contagious disease that spreads around the [place] every year, usually between October and May. Flu is caused by influenza viruses, and is spread mainly by coughing, sneezing, and close contact. Anyone can get flu. Flu strikes suddenly and can last several days. Symptoms vary by age, but can include:  fever/chills  sore throat  muscle  aches  fatigue  cough  headache  runny or stuffy nose  Flu can also lead to pneumonia and blood infections, and cause diarrhea and seizures in children. If you have a medical condition, such as heart or lung disease, flu can make it worse. Flu is more dangerous for some people. Infants and young children, people 26 years of age and older, pregnant women, and people with  certain health conditions or a weakened immune system are at greatest risk. Each year thousands of people in the [place] die from flu, and many more are hospitalized. Flu vaccine can:  keep you from getting flu,  make flu less severe if you do get it, and  keep you from spreading flu to your family and other people. 2. Inactivated and recombinant flu vaccines A dose of flu vaccine is recommended every flu season. Children 6 months through 75 years of age may need two doses during the same flu season. Everyone else needs only one dose each flu season. Some inactivated flu vaccines contain a very small amount of a mercury-based preservative called thimerosal. Studies have not shown thimerosal in vaccines to be harmful, but flu vaccines that do not contain thimerosal are available. There is no live flu virus in flu shots. They cannot cause the flu. There are many flu viruses, and they are always changing. Each year a new flu vaccine is made to protect against three or four viruses that are likely to cause disease in the upcoming flu season. But even when the vaccine doesn't exactly match these viruses, it may still provide some protection. Flu vaccine cannot prevent:  flu that is caused by a virus not covered by the vaccine, or  illnesses that look like flu but are not.  It takes about 2 weeks for protection to develop after vaccination, and protection lasts through the flu season. 3. Some people should not get this vaccine Tell the person who is giving you the vaccine:  If you have any severe, life-threatening  allergies. If you ever had a life-threatening allergic reaction after a dose of flu vaccine, or have a severe allergy to any part of this vaccine, you may be advised not to get vaccinated. Most, but not all, types of flu vaccine contain a small amount of egg protein.  If you ever had Guillain-Barr Syndrome (also called GBS). Some people with a history of GBS should not get this vaccine. This should be discussed with your doctor.  If you are not feeling well. It is usually okay to get flu vaccine when you have a mild illness, but you might be asked to come back when you feel better.  4. Risks of a vaccine reaction With any medicine, including vaccines, there is a chance of reactions. These are usually mild and go away on their own, but serious reactions are also possible. Most people who get a flu shot do not have any problems with it. Minor problems following a flu shot include:  soreness, redness, or swelling where the shot was given  hoarseness  sore, red or itchy eyes  cough  fever  aches  headache  itching  fatigue  If these problems occur, they usually begin soon after the shot and last 1 or 2 days. More serious problems following a flu shot can include the following:  There may be a small increased risk of Guillain-Barre Syndrome (GBS) after inactivated flu vaccine. This risk has been estimated at 1 or 2 additional cases per million people vaccinated. This is much lower than the risk of severe complications from flu, which can be prevented by flu vaccine.  Young children who get the flu shot along with pneumococcal vaccine (PCV13) and/or DTaP vaccine at the same time might be slightly more likely to have a seizure caused by fever. Ask your doctor for more information. Tell your doctor if a child who is getting flu vaccine has  ever had a seizure.  Problems that could happen after any injected vaccine:  People sometimes faint after a medical procedure, including  vaccination. Sitting or lying down for about 15 minutes can help prevent fainting, and injuries caused by a fall. Tell your doctor if you feel dizzy, or have vision changes or ringing in the ears.  Some people get severe pain in the shoulder and have difficulty moving the arm where a shot was given. This happens very rarely.  Any medication can cause a severe allergic reaction. Such reactions from a vaccine are very rare, estimated at about 1 in a million doses, and would happen within a few minutes to a few hours after the vaccination. As with any medicine, there is a very remote chance of a vaccine causing a serious injury or death. The safety of vaccines is always being monitored. For more information, visit: http://www.aguilar.org/ 5. What if there is a serious reaction? What should I look for? Look for anything that concerns you, such as signs of a severe allergic reaction, very high fever, or unusual behavior. Signs of a severe allergic reaction can include hives, swelling of the face and throat, difficulty breathing, a fast heartbeat, dizziness, and weakness. These would start a few minutes to a few hours after the vaccination. What should I do?  If you think it is a severe allergic reaction or other emergency that can't wait, call 9-1-1 and get the person to the nearest hospital. Otherwise, call your doctor.  Reactions should be reported to the Vaccine Adverse Event Reporting System (VAERS). Your doctor should file this report, or you can do it yourself through the VAERS web site at www.vaers.SamedayNews.es, or by calling 3057529252. ? VAERS does not give medical advice. 6. The National Vaccine Injury Compensation Program The [employer] Vaccine Injury Compensation Program (VICP) is a federal program that was created to compensate people who may have been injured by certain vaccines. Persons who believe they may have been injured by a vaccine can learn about the program and about filing a  claim by calling 3653318952 or visiting the [place] website at GoldCloset.com.ee. There is a time limit to file a claim for compensation. 7. How can I learn more?  Ask your healthcare provider. He or she can give you the vaccine package insert or suggest other sources of information.  Call your local or state health department.  Contact the Centers for Disease Control and Prevention (CDC): ? Call 825 291 4330 (1-800-CDC-INFO) or ? Visit CDC's website at https://gibson.com/ Vaccine Information Statement, Inactivated Influenza Vaccine (01/23/2014) This information is not intended to replace advice given to you by your health care provider. Make sure you discuss any questions you have with your health care provider. Document Released: 03/30/2006 Document Revised: 02/24/2016 Document Reviewed: 02/24/2016 Elsevier Interactive Patient Education  2017 [employer].

## 2018-04-15 NOTE — Progress Notes (Addendum)
Subjective:   Ellen [name] is a 77 y.o. female who presents for Medicare Annual (Subsequent) preventive examination.  Review of Systems:   Cardiac Risk Factors include: advanced age (>72men, >74 women);dyslipidemia;hypertension     Objective:     Vitals: BP 126/76   Pulse 80   Temp 98 F (36.7 C)   Ht 5' 1.5" (1.562 m)   Wt 160 lb 8 oz (72.8 kg)   LMP 11/28/1994 (Approximate)   SpO2 97%   BMI 29.84 kg/m   Body mass index is 29.84 kg/m.  Advanced Directives 04/15/2018 04/13/2017 04/03/2016 03/30/2015 03/30/2015  Does Patient Have a Medical Advance Directive? No No Yes No Yes  Type of Advance Directive - - Living will;Healthcare Power of Attorney - Living will  Would patient like information on creating a medical advance directive? Yes (MAU/Ambulatory/Procedural Areas - Information given) Yes (MAU/Ambulatory/Procedural Areas - Information given) - Yes - Educational materials given -    Tobacco Social History   Tobacco Use  Smoking Status Never Smoker  Smokeless Tobacco Never Used     Counseling given: Not Answered   Clinical Intake:  Pre-visit preparation completed: Yes  Pain : No/denies pain     Nutritional Status: BMI 25 -29 Overweight Nutritional Risks: None Diabetes: No  How often do you need to have someone help you when you read instructions, pamphlets, or other written materials from your doctor or pharmacy?: 1 - Never What is the last grade level you completed in school?: some college  Interpreter Needed?: No  Information entered by :: Reather Littler LPN  Past Medical History:  Diagnosis Date  . Hyperlipidemia   . Hypertension   . Lumbago   . Menopause   . Migraines    Past Surgical History:  Procedure Laterality Date  . CHOLECYSTECTOMY  1982  . TUBAL LIGATION  1984   Family History  Problem Relation Age of Onset  . Alzheimer's disease Mother   . Diabetes Mother   . Stroke Mother   . Hyperlipidemia Mother   . Stroke Father   .  Hypertension Father   . Hyperlipidemia Father   . Diabetes Sister   . Diabetes Brother   . Hypertension Brother   . Diabetes Sister    Social History   Socioeconomic History  . Marital status: Married    Spouse name: Not on file  . Number of children: Not on file  . Years of education: some college  . Highest education level: Some college, no degree  Occupational History  . Occupation: retired  Engineer, production  . Financial resource strain: Not hard at all  . Food insecurity:    Worry: Never true    Inability: Never true  . Transportation needs:    Medical: No    Non-medical: No  Tobacco Use  . Smoking status: Never Smoker  . Smokeless tobacco: Never Used  Substance and Sexual Activity  . Alcohol use: No    Alcohol/week: 0.0 standard drinks  . Drug use: No  . Sexual activity: Yes  Lifestyle  . Physical activity:    Days per week: 0 days    Minutes per session: 0 min  . Stress: Patient refused  Relationships  . Social connections:    Talks on phone: More than three times a week    Gets together: More than three times a week    Attends religious service: More than 4 times per year    Active member of club or organization: Yes  Attends meetings of clubs or organizations: More than 4 times per year    Relationship status: Married  Other Topics Concern  . Not on file  Social History Narrative  . Not on file    Outpatient Encounter Medications as of 04/15/2018  Medication Sig  . amLODipine (NORVASC) 5 MG tablet Take 1 tablet (5 mg total) by mouth daily.  Marland Kitchen aspirin 81 MG tablet Take 81 mg by mouth daily.  Marland Kitchen atorvastatin (LIPITOR) 20 MG tablet Take 1 tablet (20 mg total) by mouth daily.  . calcium-vitamin D 250-100 MG-UNIT tablet Take 1 tablet by mouth 2 (two) times daily.  . Cholecalciferol (VITAMIN D3) 1000 units CAPS Take 2 capsules by mouth daily.   Marland Kitchen lisinopril-hydrochlorothiazide (PRINZIDE,ZESTORETIC) 20-25 MG tablet Take 1 tablet by mouth daily.  . Magnesium  500 MG TABS Take 1 tablet by mouth daily.  . Omega-3 1000 MG CAPS Take by mouth daily.  . vitamin C (ASCORBIC ACID) 500 MG tablet Take 500 mg by mouth daily.   No facility-administered encounter medications on file as of 04/15/2018.     Activities of Daily Living In your present state of health, do you have any difficulty performing the following activities: 04/15/2018  Hearing? N  Comment declines hearing aids  Vision? N  Comment wears glasses  Difficulty concentrating or making decisions? N  Walking or climbing stairs? N  Dressing or bathing? N  Doing errands, shopping? N  Preparing Food and eating ? N  Using the Toilet? N  In the past six months, have you accidently leaked urine? N  Comment pad for protection on occasion  Do you have problems with loss of bowel control? N  Managing your Medications? N  Managing your Finances? N  Housekeeping or managing your Housekeeping? N  Some recent data might be hidden    Patient Care Team: Gabriel Cirri, NP as PCP - General (Nurse Practitioner)    Assessment:   This is a routine wellness examination for [facility].  Exercise Activities and Dietary recommendations Current Exercise Habits: The patient does not participate in regular exercise at present, Exercise limited by: None identified  Goals    . Increase water intake     Recommend drinking at least 5-6 glasses of water day        Fall Risk Fall Risk  04/15/2018 10/03/2017 08/29/2017 07/26/2017 06/26/2017  Falls in the past year? No No No No No  Number falls in past yr: - - - - -  Injury with Fall? - - - - -   FALL RISK PREVENTION PERTAINING TO THE HOME:  Any stairs in or around the home WITH handrails? Yes  Home free of loose throw rugs in walkways, pet beds, electrical cords, etc? Yes  Adequate lighting in your home to reduce risk of falls? Yes   ASSISTIVE DEVICES UTILIZED TO PREVENT FALLS:  Life alert? No  Use of a cane, walker or w/c? No  Grab bars in the bathroom? No    Shower chair or bench in shower? Yes  Elevated toilet seat or a handicapped toilet? Yes   DME ORDERS:  DME order needed?  No   TIMED UP AND GO:  Was the test performed? Yes .  Length of time to ambulate 10 feet: 6 sec.   GAIT:  Appearance of gait: Gait stead-fast and without the use of an assistive device. Education: Fall risk prevention has been discussed.  Intervention(s) required? Yes   Depression Screen PHQ 2/9 Scores 04/15/2018 10/03/2017  08/29/2017 07/26/2017  PHQ - 2 Score 0 0 0 0  PHQ- 9 Score - - - -     Cognitive Function     6CIT Screen 04/15/2018 04/13/2017  What Year? 0 points 0 points  What month? 0 points 0 points  What time? 0 points 0 points  Count back from 20 0 points 0 points  Months in reverse 0 points 0 points  Repeat phrase 2 points 0 points  Total Score 2 0    Immunization History  Administered Date(s) Administered  . Influenza, High Dose Seasonal PF 03/06/2016, 04/13/2017, 04/15/2018  . Influenza,inj,Quad PF,6+ Mos 03/30/2015  . Pneumococcal Conjugate-13 02/02/2014  . Pneumococcal Polysaccharide-23 01/27/2013  . Td 07/21/2008    Qualifies for Shingles Vaccine?Yes . Due for Shingrix. Education has been provided regarding the importance of this vaccine. Pt has been advised to call insurance company to determine out of pocket expense. Advised may also receive vaccine at local pharmacy or Health Dept. Verbalized acceptance and understanding. Pt declines Shingarix.  Tdap: completed 07/21/2008  Flu Vaccine: Due for Flu vaccine. Does the patient want to receive this vaccine today?  Yes .   Pneumococcal Vaccine: comleted 02/02/14  Screening Tests Health Maintenance  Topic Date Due  . INFLUENZA VACCINE  01/17/2018  . MAMMOGRAM  05/24/2018  . TETANUS/TDAP  07/21/2018  . COLONOSCOPY  03/25/2023  . DEXA SCAN  Completed  . PNA vac Low Risk Adult  Completed    Cancer Screenings:  Colorectal Screening: Completed 03/24/13. Repeat every 10 years;    Mammogram: Completed 05/24/16. Due 05/2018 ordered, pt to call and schedule.   Bone Density: Completed 08/04/2008. Lung Cancer Screening: (Low Dose CT Chest recommended if Age 69-80 years, 30 pack-year currently smoking OR have quit w/in 15years.) does not qualify.    Additional Screening:  Hepatitis C Screening: does not qualify  Vision Screening: Recommended annual ophthalmology exams for early detection of glaucoma and other disorders of the eye. Is the patient up to date with their annual eye exam?  YES Who is the provider or what is the name of the office in which the pt attends annual eye exams? [facility] [place]  Dental Screening: Recommended annual dental exams for proper oral hygiene  Community Resource Referral:  CRR required this visit?  No      Plan:    I have personally reviewed and addressed the Medicare Annual Wellness questionnaire and have noted the following in the patient's chart:  A. Medical and social history B. Use of alcohol, tobacco or illicit drugs  C. Current medications and supplements D. Functional ability and status E.  Nutritional status F.  Physical activity G. Advance directives H. List of other physicians I.  Hospitalizations, surgeries, and ER visits in previous 12 months J.  Vitals K. Screenings such as hearing and vision if needed, cognitive and depression L. Referrals and appointments   In addition, I have reviewed and discussed with patient certain preventive protocols, quality metrics, and best practice recommendations. A written personalized care plan for preventive services as well as general preventive health recommendations were provided to patient.   Signed,  Reather Littler, LPN Nurse Health Advisor   Nurse Notes:none

## 2018-04-16 ENCOUNTER — Encounter: Payer: Self-pay | Admitting: Family Medicine

## 2018-04-19 ENCOUNTER — Encounter: Payer: Self-pay | Admitting: Family Medicine

## 2018-04-19 ENCOUNTER — Ambulatory Visit (INDEPENDENT_AMBULATORY_CARE_PROVIDER_SITE_OTHER): Payer: Medicare Other | Admitting: Family Medicine

## 2018-04-19 VITALS — BP 139/83 | HR 79 | Temp 98.7°F | Ht 63.0 in | Wt 162.0 lb

## 2018-04-19 DIAGNOSIS — R7301 Impaired fasting glucose: Secondary | ICD-10-CM | POA: Diagnosis not present

## 2018-04-19 DIAGNOSIS — I10 Essential (primary) hypertension: Secondary | ICD-10-CM

## 2018-04-19 DIAGNOSIS — I700 Atherosclerosis of aorta: Secondary | ICD-10-CM

## 2018-04-19 DIAGNOSIS — Z0000 Encounter for general adult medical examination without abnormal findings: Secondary | ICD-10-CM

## 2018-04-19 NOTE — Progress Notes (Signed)
BP 139/83 (BP Location: Left Arm, Cuff Size: Normal)   Pulse 79   Temp 98.7 F (37.1 C) (Oral)   Ht 5\' 3"  (1.6 m)   Wt 162 lb (73.5 kg)   LMP 11/28/1994 (Approximate)   SpO2 98%   BMI 28.70 kg/m    Subjective:    Patient ID: Ellen [name], female    DOB: 21-Dec-1940, 77 y.o.   MRN: [id number]  HPI: Ellen [name] is a 77 y.o. female presenting on 04/19/2018 for comprehensive medical examination. Current medical complaints include:none  BPs behaving at home. Typically in the 120s/70s. Taking her medications faithfully without side effects. Denies CP, SOB, dizziness, HAs. Taking atorvastatin for chol management without issue. Tries to stay active and eat a healthy diet.   She currently lives with: Menopausal Symptoms: no  Depression Screen done today and results listed below:  Depression screen [facility] 2/9 04/15/2018 10/03/2017 08/29/2017 07/26/2017 06/26/2017  Decreased Interest 0 0 0 0 0  Down, Depressed, Hopeless 0 0 0 0 0  PHQ - 2 Score 0 0 0 0 0  Altered sleeping - - - - -  Tired, decreased energy - - - - -  Change in appetite - - - - -  Feeling bad or failure about yourself  - - - - -  Trouble concentrating - - - - -  Moving slowly or fidgety/restless - - - - -  Suicidal thoughts - - - - -  PHQ-9 Score - - - - -  Difficult doing work/chores - - - - -    The patient does not have a history of falls. I did not complete a risk assessment for falls. A plan of care for falls was not documented.   Past Medical History:  Past Medical History:  Diagnosis Date  . Hyperlipidemia   . Hypertension   . Lumbago   . Menopause   . Migraines     Surgical History:  Past Surgical History:  Procedure Laterality Date  . CHOLECYSTECTOMY  1982  . TUBAL LIGATION  1984    Medications:  Current Outpatient Medications on File Prior to Visit  Medication Sig  . amLODipine (NORVASC) 5 MG tablet Take 1 tablet (5 mg total) by mouth daily.  Marland Kitchen aspirin 81 MG tablet Take 81 mg by mouth  daily.  Marland Kitchen atorvastatin (LIPITOR) 20 MG tablet Take 1 tablet (20 mg total) by mouth daily.  . calcium-vitamin D 250-100 MG-UNIT tablet Take 1 tablet by mouth 2 (two) times daily.  . Cholecalciferol (VITAMIN D3) 1000 units CAPS Take 2 capsules by mouth daily.   Marland Kitchen lisinopril-hydrochlorothiazide (PRINZIDE,ZESTORETIC) 20-25 MG tablet Take 1 tablet by mouth daily.  . Magnesium 500 MG TABS Take 1 tablet by mouth daily.  . Omega-3 1000 MG CAPS Take by mouth daily.  . vitamin C (ASCORBIC ACID) 500 MG tablet Take 500 mg by mouth daily.   No current facility-administered medications on file prior to visit.     Allergies:  No Known Allergies  Social History:  Social History   Socioeconomic History  . Marital status: Married    Spouse name: Not on file  . Number of children: Not on file  . Years of education: some college  . Highest education level: Some college, no degree  Occupational History  . Occupation: retired  Engineer, production  . Financial resource strain: Not hard at all  . Food insecurity:    Worry: Never true    Inability: Never true  .  Transportation needs:    Medical: No    Non-medical: No  Tobacco Use  . Smoking status: Never Smoker  . Smokeless tobacco: Never Used  Substance and Sexual Activity  . Alcohol use: No    Alcohol/week: 0.0 standard drinks  . Drug use: No  . Sexual activity: Yes  Lifestyle  . Physical activity:    Days per week: 0 days    Minutes per session: 0 min  . Stress: Patient refused  Relationships  . Social connections:    Talks on phone: More than three times a week    Gets together: More than three times a week    Attends religious service: More than 4 times per year    Active member of club or organization: Yes    Attends meetings of clubs or organizations: More than 4 times per year    Relationship status: Married  . Intimate partner violence:    Fear of current or ex partner: No    Emotionally abused: No    Physically abused: No     Forced sexual activity: No  Other Topics Concern  . Not on file  Social History Narrative  . Not on file   Social History   Tobacco Use  Smoking Status Never Smoker  Smokeless Tobacco Never Used   Social History   Substance and Sexual Activity  Alcohol Use No  . Alcohol/week: 0.0 standard drinks    Family History:  Family History  Problem Relation Age of Onset  . Alzheimer's disease Mother   . Diabetes Mother   . Stroke Mother   . Hyperlipidemia Mother   . Stroke Father   . Hypertension Father   . Hyperlipidemia Father   . Diabetes Sister   . Diabetes Brother   . Hypertension Brother   . Diabetes Sister     Past medical history, surgical history, medications, allergies, family history and social history reviewed with patient today and changes made to appropriate areas of the chart.   Review of Systems - General ROS: negative Psychological ROS: negative Ophthalmic ROS: negative ENT ROS: negative Allergy and Immunology ROS: negative Hematological and Lymphatic ROS: negative Endocrine ROS: negative Breast ROS: negative for breast lumps Respiratory ROS: no cough, shortness of breath, or wheezing Cardiovascular ROS: no chest pain or dyspnea on exertion Gastrointestinal ROS: no abdominal pain, change in bowel habits, or black or bloody stools Genito-Urinary ROS: no dysuria, trouble voiding, or hematuria Musculoskeletal ROS: negative Neurological ROS: no TIA or stroke symptoms Dermatological ROS: negative All other ROS negative except what is listed above and in the HPI.      Objective:    BP 139/83 (BP Location: Left Arm, Cuff Size: Normal)   Pulse 79   Temp 98.7 F (37.1 C) (Oral)   Ht 5\' 3"  (1.6 m)   Wt 162 lb (73.5 kg)   LMP 11/28/1994 (Approximate)   SpO2 98%   BMI 28.70 kg/m   Wt Readings from Last 3 Encounters:  04/19/18 162 lb (73.5 kg)  04/15/18 160 lb 8 oz (72.8 kg)  11/13/17 162 lb 1.6 oz (73.5 kg)    Physical Exam  Constitutional: She is  oriented to person, place, and time. She appears well-developed and well-nourished. No distress.  HENT:  Head: Atraumatic.  Right Ear: External ear normal.  Left Ear: External ear normal.  Nose: Nose normal.  Mouth/Throat: Oropharynx is clear and moist. No oropharyngeal exudate.  Eyes: Pupils are equal, round, and reactive to light. Conjunctivae are normal.  No scleral icterus.  Neck: Normal range of motion. Neck supple. No thyromegaly present.  Cardiovascular: Normal rate, regular rhythm, normal heart sounds and intact distal pulses.  Pulmonary/Chest: Effort normal and breath sounds normal. No respiratory distress. Right breast exhibits no mass, no nipple discharge, no skin change and no tenderness. Left breast exhibits no mass, no nipple discharge, no skin change and no tenderness.  Abdominal: Soft. Bowel sounds are normal. She exhibits no mass. There is no tenderness.  Genitourinary:  Genitourinary Comments: GU exam declined  Musculoskeletal: Normal range of motion. She exhibits no edema or tenderness.  Lymphadenopathy:    She has no cervical adenopathy.    She has no axillary adenopathy.  Neurological: She is alert and oriented to person, place, and time. No cranial nerve deficit.  Skin: Skin is warm and dry. No rash noted.  Psychiatric: She has a normal mood and affect. Her behavior is normal.  Nursing note and vitals reviewed.   Results for orders placed or performed in visit on 04/19/18  Microscopic Examination  Result Value Ref Range   WBC, UA 0-5 0 - 5 /hpf   RBC, UA 0-2 0 - 2 /hpf   Epithelial Cells (non renal) 0-10 0 - 10 /hpf   Renal Epithel, UA 0-10 (A) None seen /hpf   Bacteria, UA Moderate (A) None seen/Few  CBC with Differential/Platelet  Result Value Ref Range   WBC 5.8 3.4 - 10.8 x10E3/uL   RBC 4.21 3.77 - 5.28 x10E6/uL   Hemoglobin 11.3 11.1 - 15.9 g/dL   Hematocrit 16.1 09.6 - 46.6 %   MCV 83 79 - 97 fL   MCH 26.8 26.6 - 33.0 pg   MCHC 32.5 31.5 - 35.7 g/dL     RDW 04.5 40.9 - 81.1 %   Platelets 287 150 - 450 x10E3/uL   Neutrophils 47 Not Estab. %   Lymphs 39 Not Estab. %   Monocytes 9 Not Estab. %   Eos 4 Not Estab. %   Basos 1 Not Estab. %   Neutrophils Absolute 2.6 1.4 - 7.0 x10E3/uL   Lymphocytes Absolute 2.3 0.7 - 3.1 x10E3/uL   Monocytes Absolute 0.5 0.1 - 0.9 x10E3/uL   EOS (ABSOLUTE) 0.3 0.0 - 0.4 x10E3/uL   Basophils Absolute 0.1 0.0 - 0.2 x10E3/uL   Immature Granulocytes 0 Not Estab. %   Immature Grans (Abs) 0.0 0.0 - 0.1 x10E3/uL  Comprehensive metabolic panel  Result Value Ref Range   Glucose 105 (H) 65 - 99 mg/dL   BUN 16 8 - 27 mg/dL   Creatinine, Ser 9.14 0.57 - 1.00 mg/dL   GFR calc non Af Amer 80 >59 mL/min/1.73   GFR calc Af Amer 92 >59 mL/min/1.73   BUN/Creatinine Ratio 22 12 - 28   Sodium 143 134 - 144 mmol/L   Potassium 3.3 (L) 3.5 - 5.2 mmol/L   Chloride 100 96 - 106 mmol/L   CO2 27 20 - 29 mmol/L   Calcium 9.6 8.7 - 10.3 mg/dL   Total Protein 6.7 6.0 - 8.5 g/dL   Albumin 4.2 3.5 - 4.8 g/dL   Globulin, Total 2.5 1.5 - 4.5 g/dL   Albumin/Globulin Ratio 1.7 1.2 - 2.2   Bilirubin Total <0.2 0.0 - 1.2 mg/dL   Alkaline Phosphatase 73 39 - 117 IU/L   AST 25 0 - 40 IU/L   ALT 19 0 - 32 IU/L  Lipid Panel w/o Chol/HDL Ratio  Result Value Ref Range   Cholesterol, Total 125 100 -  199 mg/dL   Triglycerides 161 0 - 149 mg/dL   HDL 49 >09 mg/dL   VLDL Cholesterol Cal 29 5 - 40 mg/dL   LDL Calculated 47 0 - 99 mg/dL  UA/M w/rflx Culture, Routine  Result Value Ref Range   Specific Gravity, UA 1.010 1.005 - 1.030   pH, UA 7.0 5.0 - 7.5   Color, UA Yellow Yellow   Appearance Ur Cloudy (A) Clear   Leukocytes, UA 2+ (A) Negative   Protein, UA Negative Negative/Trace   Glucose, UA Negative Negative   Ketones, UA Negative Negative   RBC, UA Negative Negative   Bilirubin, UA Negative Negative   Urobilinogen, Ur 0.2 0.2 - 1.0 mg/dL   Nitrite, UA Negative Negative   Microscopic Examination See below:   HgB A1c   Result Value Ref Range   Hgb A1c MFr Bld 6.4 (H) 4.8 - 5.6 %   Est. average glucose Bld gHb Est-mCnc 137 mg/dL      Assessment & Plan:   Problem List Items Addressed This Visit      Cardiovascular and Mediastinum   Hypertension - Primary    Stable and WNL, continue current regimen      Relevant Orders   CBC with Differential/Platelet (Completed)   Comprehensive metabolic panel (Completed)   UA/M w/rflx Culture, Routine (Completed)   Atherosclerosis of aorta (HCC)    Recheck lipids, continue atorvastatin      Relevant Orders   Lipid Panel w/o Chol/HDL Ratio (Completed)     Endocrine   IFG (impaired fasting glucose)    Diet controlled, continue current regimen. Recheck A1C      Relevant Orders   HgB A1c (Completed)    Other Visit Diagnoses    Annual physical exam           Follow up plan: Return in about 6 months (around 10/18/2018) for BP, chol.   LABORATORY TESTING:  - Pap smear: not applicable  IMMUNIZATIONS:   - Tdap: Tetanus vaccination status reviewed: last tetanus booster within 10 years. - Influenza: Up to date - Pneumovax: Up to date - Prevnar: Up to date - HPV: Not applicable - Zostavax vaccine: Refused  SCREENING: -Mammogram: Ordered today  - Colonoscopy: Up to date  - Bone Density: Up to date   PATIENT COUNSELING:   Advised to take 1 mg of folate supplement per day if capable of pregnancy.   Sexuality: Discussed sexually transmitted diseases, partner selection, use of condoms, avoidance of unintended pregnancy  and contraceptive alternatives.   Advised to avoid cigarette smoking.  I discussed with the patient that most people either abstain from alcohol or drink within safe limits (<=14/week and <=4 drinks/occasion for males, <=7/weeks and <= 3 drinks/occasion for females) and that the risk for alcohol disorders and other health effects rises proportionally with the number of drinks per week and how often a drinker exceeds daily  limits.  Discussed cessation/primary prevention of drug use and availability of treatment for abuse.   Diet: Encouraged to adjust caloric intake to maintain  or achieve ideal body weight, to reduce intake of dietary saturated fat and total fat, to limit sodium intake by avoiding high sodium foods and not adding table salt, and to maintain adequate dietary potassium and calcium preferably from fresh fruits, vegetables, and low-fat dairy products.    stressed the importance of regular exercise  Injury prevention: Discussed safety belts, safety helmets, smoke detector, smoking near bedding or upholstery.   Dental health: Discussed importance of  regular tooth brushing, flossing, and dental visits.    NEXT PREVENTATIVE PHYSICAL DUE IN 1 YEAR. Return in about 6 months (around 10/18/2018) for BP, chol.

## 2018-04-19 NOTE — Patient Instructions (Signed)
Follow up in 6 months 

## 2018-04-20 LAB — CBC WITH DIFFERENTIAL/PLATELET
BASOS ABS: 0.1 10*3/uL (ref 0.0–0.2)
Basos: 1 %
EOS (ABSOLUTE): 0.3 10*3/uL (ref 0.0–0.4)
Eos: 4 %
Hematocrit: 34.8 % (ref 34.0–46.6)
Hemoglobin: 11.3 g/dL (ref 11.1–15.9)
IMMATURE GRANS (ABS): 0 10*3/uL (ref 0.0–0.1)
IMMATURE GRANULOCYTES: 0 %
Lymphocytes Absolute: 2.3 10*3/uL (ref 0.7–3.1)
Lymphs: 39 %
MCH: 26.8 pg (ref 26.6–33.0)
MCHC: 32.5 g/dL (ref 31.5–35.7)
MCV: 83 fL (ref 79–97)
MONOS ABS: 0.5 10*3/uL (ref 0.1–0.9)
Monocytes: 9 %
Neutrophils Absolute: 2.6 10*3/uL (ref 1.4–7.0)
Neutrophils: 47 %
PLATELETS: 287 10*3/uL (ref 150–450)
RBC: 4.21 x10E6/uL (ref 3.77–5.28)
RDW: 13.6 % (ref 12.3–15.4)
WBC: 5.8 10*3/uL (ref 3.4–10.8)

## 2018-04-20 LAB — COMPREHENSIVE METABOLIC PANEL
ALT: 19 IU/L (ref 0–32)
AST: 25 IU/L (ref 0–40)
Albumin/Globulin Ratio: 1.7 (ref 1.2–2.2)
Albumin: 4.2 g/dL (ref 3.5–4.8)
Alkaline Phosphatase: 73 IU/L (ref 39–117)
BUN/Creatinine Ratio: 22 (ref 12–28)
BUN: 16 mg/dL (ref 8–27)
Bilirubin Total: <0.2 mg/dL (ref 0.0–1.2)
CO2: 27 mmol/L (ref 20–29)
Calcium: 9.6 mg/dL (ref 8.7–10.3)
Chloride: 100 mmol/L (ref 96–106)
Creatinine, Ser: 0.74 mg/dL (ref 0.57–1.00)
GFR calc non Af Amer: 80 mL/min/{1.73_m2} (ref >59)
GFR, EST AFRICAN AMERICAN: 92 mL/min/{1.73_m2} (ref >59)
GLUCOSE: 105 mg/dL — AB (ref 65–99)
Globulin, Total: 2.5 g/dL (ref 1.5–4.5)
POTASSIUM: 3.3 mmol/L — AB (ref 3.5–5.2)
Sodium: 143 mmol/L (ref 134–144)
TOTAL PROTEIN: 6.7 g/dL (ref 6.0–8.5)

## 2018-04-20 LAB — MICROSCOPIC EXAMINATION

## 2018-04-20 LAB — UA/M W/RFLX CULTURE, ROUTINE
BILIRUBIN UA: NEGATIVE
Glucose, UA: NEGATIVE
Ketones, UA: NEGATIVE
Nitrite, UA: NEGATIVE
PH UA: 7 (ref 5.0–7.5)
Protein, UA: NEGATIVE
RBC UA: NEGATIVE
Specific Gravity, UA: 1.01 (ref 1.005–1.030)
Urobilinogen, Ur: 0.2 mg/dL (ref 0.2–1.0)

## 2018-04-20 LAB — LIPID PANEL W/O CHOL/HDL RATIO
CHOLESTEROL TOTAL: 125 mg/dL (ref 100–199)
HDL: 49 mg/dL (ref >39)
LDL Calculated: 47 mg/dL (ref 0–99)
TRIGLYCERIDES: 143 mg/dL (ref 0–149)
VLDL CHOLESTEROL CAL: 29 mg/dL (ref 5–40)

## 2018-04-20 LAB — HEMOGLOBIN A1C
Est. average glucose Bld gHb Est-mCnc: 137 mg/dL
Hgb A1c MFr Bld: 6.4 % — ABNORMAL HIGH (ref 4.8–5.6)

## 2018-04-22 NOTE — Assessment & Plan Note (Signed)
Stable and WNL, continue current regimen 

## 2018-04-22 NOTE — Assessment & Plan Note (Signed)
Recheck lipids, continue atorvastatin.

## 2018-04-22 NOTE — Assessment & Plan Note (Signed)
Diet controlled, continue current regimen. Recheck A1C

## 2018-04-23 ENCOUNTER — Other Ambulatory Visit: Payer: Self-pay | Admitting: Family Medicine

## 2018-04-23 DIAGNOSIS — E876 Hypokalemia: Secondary | ICD-10-CM

## 2018-04-24 ENCOUNTER — Other Ambulatory Visit: Payer: Medicare Other

## 2018-04-24 DIAGNOSIS — E876 Hypokalemia: Secondary | ICD-10-CM

## 2018-04-25 LAB — BASIC METABOLIC PANEL
BUN/Creatinine Ratio: 27 (ref 12–28)
BUN: 21 mg/dL (ref 8–27)
CO2: 27 mmol/L (ref 20–29)
CREATININE: 0.79 mg/dL (ref 0.57–1.00)
Calcium: 9.6 mg/dL (ref 8.7–10.3)
Chloride: 100 mmol/L (ref 96–106)
GFR calc Af Amer: 85 mL/min/{1.73_m2} (ref >59)
GFR calc non Af Amer: 74 mL/min/{1.73_m2} (ref >59)
GLUCOSE: 108 mg/dL — AB (ref 65–99)
Potassium: 3.6 mmol/L (ref 3.5–5.2)
Sodium: 142 mmol/L (ref 134–144)

## 2018-06-05 ENCOUNTER — Ambulatory Visit
Admission: RE | Admit: 2018-06-05 | Discharge: 2018-06-05 | Disposition: A | Discharge status: Home / Self Care | Payer: Medicare Other | Source: Ambulatory Visit | Attending: Family Medicine | Admitting: Family Medicine

## 2018-06-05 DIAGNOSIS — Z1231 Encounter for screening mammogram for malignant neoplasm of breast: Secondary | ICD-10-CM | POA: Diagnosis not present

## 2018-10-02 ENCOUNTER — Other Ambulatory Visit: Payer: Self-pay | Admitting: Unknown Physician Specialty

## 2018-10-02 NOTE — Telephone Encounter (Signed)
Requested Prescriptions  Pending Prescriptions Disp Refills  . amLODipine (NORVASC) 5 MG tablet [Pharmacy Med Name: amLODIPine Besylate 5 MG Oral Tablet] 90 tablet 0    Sig: Take 1 tablet by mouth once daily     Cardiovascular:  Calcium Channel Blockers Passed - 10/02/2018 11:10 AM      Passed - Last BP in normal range    BP Readings from Last 1 Encounters:  04/19/18 139/83         Passed - Valid encounter within last 6 months    Recent Outpatient Visits          5 months ago Essential hypertension   [facility] [name] [place], [place]   10 months ago Essential hypertension   [facility] Gabriel Cirri, NP   1 year ago Cervical stenosis of spine   [facility] [name] [place], [place]   1 year ago Mid back pain on right side   [facility] Gabriel Cirri, NP   1 year ago Essential hypertension   [facility] Gabriel Cirri, NP      Future Appointments            In 2 weeks Maurice March, Salley Hews, PA-C [facility]   In 6 months  [facility]         . atorvastatin (LIPITOR) 20 MG tablet [Pharmacy Med Name: Atorvastatin Calcium 20 MG Oral Tablet] 90 tablet 0    Sig: Take 1 tablet by mouth once daily     Cardiovascular:  Antilipid - Statins Passed - 10/02/2018 11:10 AM      Passed - Total Cholesterol in normal range and within 360 days    Cholesterol, Total  Date Value Ref Range Status  04/19/2018 125 100 - 199 mg/dL Final   Cholesterol Piccolo, Waived  Date Value Ref Range Status  10/02/2016 172 <200 mg/dL Final    Comment:                            Desirable                <200                         Borderline High      200- 239                         High                     >239          Passed - LDL in normal range and within 360 days    LDL Calculated  Date Value Ref Range Status  04/19/2018 47 0 - 99 mg/dL Final         Passed - HDL in normal  range and within 360 days    HDL  Date Value Ref Range Status  04/19/2018 49 >39 mg/dL Final         Passed - Triglycerides in normal range and within 360 days    Triglycerides  Date Value Ref Range Status  04/19/2018 143 0 - 149 mg/dL Final   Triglycerides Piccolo,Waived  Date Value Ref Range Status  10/02/2016 136 <150 mg/dL Final    Comment:  Normal                   <150                         Borderline High     150 - 199                         High                200 - 499                         Very High                >499          Passed - Patient is not pregnant      Passed - Valid encounter within last 12 months    Recent Outpatient Visits          5 months ago [facility]ential [facility] [place]   10 months ago Essential hypertension   [facility] Gabriel CirriWicker, Cheryl, NP   1 year ago Cervical stenosis of spine   [facility] MaserLane, [place]   1 year ago Mid back pain on right side   [facility] Cheryl[facility]   1 year ago Essential hypertension   [facility] CirriWicker, Cheryl, NP      Future Appointments            In 2 weeks Maurice MarchLane, Salley Hewsachel Elizabeth[facility]   [facility] 6 months  [facility]

## 2018-10-03 ENCOUNTER — Ambulatory Visit: Payer: Medicare Other | Attending: Anesthesiology | Admitting: Anesthesiology

## 2018-10-03 ENCOUNTER — Encounter: Payer: Self-pay | Admitting: Anesthesiology

## 2018-10-03 ENCOUNTER — Other Ambulatory Visit: Payer: Self-pay

## 2018-10-03 DIAGNOSIS — M549 Dorsalgia, unspecified: Secondary | ICD-10-CM

## 2018-10-03 DIAGNOSIS — M5432 Sciatica, left side: Secondary | ICD-10-CM | POA: Diagnosis not present

## 2018-10-03 DIAGNOSIS — M4716 Other spondylosis with myelopathy, lumbar region: Secondary | ICD-10-CM

## 2018-10-03 DIAGNOSIS — M48062 Spinal stenosis, lumbar region with neurogenic claudication: Secondary | ICD-10-CM

## 2018-10-03 DIAGNOSIS — M5136 Other intervertebral disc degeneration, lumbar region: Secondary | ICD-10-CM | POA: Diagnosis not present

## 2018-10-03 DIAGNOSIS — M5431 Sciatica, right side: Secondary | ICD-10-CM

## 2018-10-03 NOTE — Progress Notes (Signed)
Virtual Visit via Telephone Note  I connected with Ellen [name] on 10/03/18 at  3:45 PM EDT by telephone and verified that I am speaking with the correct person using two identifiers.   I discussed the limitations, risks, security and privacy concerns of performing an evaluation and management service by telephone and the availability of in person appointments. I also discussed with the patient that there may be a patient responsible charge related to this service. The patient expressed understanding and agreed to proceed.   History of Present Illness: I had a long conversation with Ellen [name] today over the phone.  She has done well with epidurals in the past and is reporting some recurrence of her low back pain and lower leg pain similar to what she has had in the past.  This has responded favorably to epidurals and at this point she desires to proceed with a repeat epidural when that is allowable with her current viral crisis undergoing.  The quality of her back pain is been stable but she is having some more left calf cramping which responded favorably to epidurals in the past.  She has tried conservative therapy with stretching strengthening exercises and anti-inflammatories without much success.  Otherwise she is in her usual state of health at this time.    Observations/Objective:   Assessment and Plan: 1. Spinal stenosis of lumbar region with neurogenic claudication   2. Mid back pain on right side   3. Sciatica of left side   4. DDD (degenerative disc disease), lumbar   5. Spondylosis, lumbar, with myelopathy   6. Sciatica of right side   I am scheduling Ellen [name] for a 1 month return to clinic for an epidural.  We have gone over the risks and benefit this procedure with her in the past.  I want her to continue with stretching strengthening exercises in the meantime with conservative care and anti-inflammatory management for her low back pain.  She is also instructed to follow-up  with her primary care physicians as per usual.   Follow Up Instructions:    I discussed the assessment and treatment plan with the patient. The patient was provided an opportunity to ask questions and all were answered. The patient agreed with the plan and demonstrated an understanding of the instructions.   The patient was advised to call back or seek an in-person evaluation if the symptoms worsen or if the condition fails to improve as anticipated.  I provided 20 minutes of non-face-to-face time during this encounter.   Yevette Edwards, MD

## 2018-10-18 ENCOUNTER — Ambulatory Visit: Payer: Medicare Other | Admitting: Family Medicine

## 2018-11-01 ENCOUNTER — Other Ambulatory Visit: Payer: Self-pay

## 2018-11-01 ENCOUNTER — Ambulatory Visit: Payer: Medicare Other | Admitting: Family Medicine

## 2018-11-08 ENCOUNTER — Ambulatory Visit (INDEPENDENT_AMBULATORY_CARE_PROVIDER_SITE_OTHER): Payer: Medicare Other | Admitting: Family Medicine

## 2018-11-08 ENCOUNTER — Other Ambulatory Visit: Payer: Self-pay

## 2018-11-08 ENCOUNTER — Encounter: Payer: Self-pay | Admitting: Family Medicine

## 2018-11-08 VITALS — BP 148/83 | HR 96 | Temp 98.7°F | Ht 63.0 in | Wt 168.2 lb

## 2018-11-08 DIAGNOSIS — E782 Mixed hyperlipidemia: Secondary | ICD-10-CM | POA: Diagnosis not present

## 2018-11-08 DIAGNOSIS — E785 Hyperlipidemia, unspecified: Secondary | ICD-10-CM | POA: Insufficient documentation

## 2018-11-08 DIAGNOSIS — I10 Essential (primary) hypertension: Secondary | ICD-10-CM | POA: Diagnosis not present

## 2018-11-08 DIAGNOSIS — R7301 Impaired fasting glucose: Secondary | ICD-10-CM | POA: Diagnosis not present

## 2018-11-08 NOTE — Progress Notes (Signed)
BP (!) 148/83   Pulse 96   Temp 98.7 F (37.1 C) (Oral)   Ht 5\' 3"  (1.6 m)   Wt 168 lb 3.2 oz (76.3 kg)   LMP 11/28/1994 (Approximate)   SpO2 96%   BMI 29.80 kg/m    Subjective:    Patient ID: Ellen [name], female    DOB: [birth year], 78 y.o.   MRN: [id number]  HPI: Ellen [name] is a 78 y.o. female  Chief Complaint  Patient presents with  . Hypertension    746mf/u  . Hyperlipidemia   Here today for 6 month f/u chronic conditions.   Does not check home BPs. Takes medications faithfully without side effects. Denies CP, SOB, HAs, dizziness. Stays very active and tries to eat well. Taking lipitor daily for HLD. Denies myalgias, claudication.   IFG - diet controlled. No low blood sugar spells, polyuria, polydipsia.   No new concerns today.   Relevant past medical, surgical, family and social history reviewed and updated as indicated. Interim medical history since our last visit reviewed. Allergies and medications reviewed and updated.  Review of Systems  Per HPI unless specifically indicated above     Objective:    BP (!) 148/83   Pulse 96   Temp 98.7 F (37.1 C) (Oral)   Ht 5\' 3"  (1.6 m)   Wt 168 lb 3.2 oz (76.3 kg)   LMP 11/28/1994 (Approximate)   SpO2 96%   BMI 29.80 kg/m   Wt Readings from Last 3 Encounters:  11/08/18 168 lb 3.2 oz (76.3 kg)  04/19/18 162 lb (73.5 kg)  04/15/18 160 lb 8 oz (72.8 kg)    Physical Exam Vitals signs and nursing note reviewed.  Constitutional:      Appearance: Normal appearance. She is not ill-appearing.  HENT:     Head: Atraumatic.  Eyes:     Extraocular Movements: Extraocular movements intact.     Conjunctiva/sclera: Conjunctivae normal.  Neck:     Musculoskeletal: Normal range of motion and neck supple.  Cardiovascular:     Rate and Rhythm: Normal rate and regular rhythm.     Heart sounds: Normal heart sounds.  Pulmonary:     Effort: Pulmonary effort is normal.     Breath sounds: Normal breath sounds.   Musculoskeletal: Normal range of motion.  Skin:    General: Skin is warm and dry.  Neurological:     Mental Status: She is alert and oriented to person, place, and time.  Psychiatric:        Mood and Affect: Mood normal.        Thought Content: Thought content normal.        Judgment: Judgment normal.     Results for orders placed or performed in visit on 11/08/18  Comprehensive metabolic panel  Result Value Ref Range   Glucose 107 (H) 65 - 99 mg/dL   BUN 16 8 - 27 mg/dL   Creatinine, Ser 4.090.76 0.57 - 1.00 mg/dL   GFR calc non Af Amer 77 >59 mL/min/1.73   GFR calc Af Amer 89 >59 mL/min/1.73   BUN/Creatinine Ratio 21 12 - 28   Sodium 141 134 - 144 mmol/L   Potassium 3.6 3.5 - 5.2 mmol/L   Chloride 101 96 - 106 mmol/L   CO2 26 20 - 29 mmol/L   Calcium 9.7 8.7 - 10.3 mg/dL   Total Protein 6.4 6.0 - 8.5 g/dL   Albumin 4.2 3.7 - 4.7 g/dL   Globulin, Total  2.2 1.5 - 4.5 g/dL   Albumin/Globulin Ratio 1.9 1.2 - 2.2   Bilirubin Total <0.2 0.0 - 1.2 mg/dL   Alkaline Phosphatase 75 39 - 117 IU/L   AST 22 0 - 40 IU/L   ALT 18 0 - 32 IU/L  Lipid Panel w/o Chol/HDL Ratio  Result Value Ref Range   Cholesterol, Total 124 100 - 199 mg/dL   Triglycerides 389 0 - 149 mg/dL   HDL 47 >37 mg/dL   VLDL Cholesterol Cal 23 5 - 40 mg/dL   LDL Calculated 54 0 - 99 mg/dL  HgB D4K  Result Value Ref Range   Hgb A1c MFr Bld 6.0 (H) 4.8 - 5.6 %   Est. average glucose Bld gHb Est-mCnc 126 mg/dL      Assessment & Plan:   Problem List Items Addressed This Visit      Cardiovascular and Mediastinum   Hypertension - Primary    BPs mildly above goal today but patient states she gets nervous at the doctor's office. She will start checking at home as she's able and call with persistent elevated readings. Continue current regimen, she declines increasing medication. DASH diet, exercise reviewed      Relevant Medications   lisinopril-hydrochlorothiazide (ZESTORETIC) 20-25 MG tablet   Other Relevant  Orders   Comprehensive metabolic panel (Completed)     Endocrine   IFG (impaired fasting glucose)    Recheck A1C, continue good lifestyle modifications      Relevant Orders   HgB A1c (Completed)     Other   Hyperlipidemia    Recheck lipids, adjust as needed. Continue good diet and exercise      Relevant Medications   lisinopril-hydrochlorothiazide (ZESTORETIC) 20-25 MG tablet   Other Relevant Orders   Lipid Panel w/o Chol/HDL Ratio (Completed)       Follow up plan: Return in about 6 months (around 05/11/2019) for CPE.

## 2018-11-12 ENCOUNTER — Encounter: Payer: Self-pay | Admitting: Family Medicine

## 2018-11-12 LAB — COMPREHENSIVE METABOLIC PANEL
ALT: 18 IU/L (ref 0–32)
AST: 22 IU/L (ref 0–40)
Albumin/Globulin Ratio: 1.9 (ref 1.2–2.2)
Albumin: 4.2 g/dL (ref 3.7–4.7)
Alkaline Phosphatase: 75 IU/L (ref 39–117)
BUN/Creatinine Ratio: 21 (ref 12–28)
BUN: 16 mg/dL (ref 8–27)
Bilirubin Total: <0.2 mg/dL (ref 0.0–1.2)
CO2: 26 mmol/L (ref 20–29)
Calcium: 9.7 mg/dL (ref 8.7–10.3)
Chloride: 101 mmol/L (ref 96–106)
Creatinine, Ser: 0.76 mg/dL (ref 0.57–1.00)
GFR calc Af Amer: 89 mL/min/{1.73_m2} (ref >59)
GFR calc non Af Amer: 77 mL/min/{1.73_m2} (ref >59)
Globulin, Total: 2.2 g/dL (ref 1.5–4.5)
Glucose: 107 mg/dL — ABNORMAL HIGH (ref 65–99)
Potassium: 3.6 mmol/L (ref 3.5–5.2)
Sodium: 141 mmol/L (ref 134–144)
Total Protein: 6.4 g/dL (ref 6.0–8.5)

## 2018-11-12 LAB — HEMOGLOBIN A1C
Est. average glucose Bld gHb Est-mCnc: 126 mg/dL
Hgb A1c MFr Bld: 6 % — ABNORMAL HIGH (ref 4.8–5.6)

## 2018-11-12 LAB — LIPID PANEL W/O CHOL/HDL RATIO
Cholesterol, Total: 124 mg/dL (ref 100–199)
HDL: 47 mg/dL (ref >39)
LDL Calculated: 54 mg/dL (ref 0–99)
Triglycerides: 117 mg/dL (ref 0–149)
VLDL Cholesterol Cal: 23 mg/dL (ref 5–40)

## 2018-11-13 MED ORDER — LISINOPRIL-HYDROCHLOROTHIAZIDE 20-25 MG PO TABS: 1.0000 | ORAL_TABLET | Freq: Every day | ORAL | 3 refills | Status: DC

## 2018-11-14 NOTE — Assessment & Plan Note (Signed)
BPs mildly above goal today but patient states she gets nervous at the doctor's office. She will start checking at home as she's able and call with persistent elevated readings. Continue current regimen, she declines increasing medication. DASH diet, exercise reviewed

## 2018-11-14 NOTE — Assessment & Plan Note (Signed)
Recheck lipids, adjust as needed. Continue good diet and exercise 

## 2018-11-14 NOTE — Assessment & Plan Note (Signed)
Recheck A1C, continue good lifestyle modifications 

## 2019-01-08 ENCOUNTER — Other Ambulatory Visit: Payer: Self-pay | Admitting: Family Medicine

## 2019-02-12 ENCOUNTER — Other Ambulatory Visit: Payer: Self-pay | Admitting: Family Medicine

## 2019-03-18 ENCOUNTER — Ambulatory Visit (INDEPENDENT_AMBULATORY_CARE_PROVIDER_SITE_OTHER): Payer: Medicare Other

## 2019-03-18 ENCOUNTER — Other Ambulatory Visit: Payer: Self-pay

## 2019-03-18 DIAGNOSIS — Z23 Encounter for immunization: Secondary | ICD-10-CM

## 2019-04-23 ENCOUNTER — Other Ambulatory Visit: Payer: Self-pay

## 2019-04-23 ENCOUNTER — Ambulatory Visit (INDEPENDENT_AMBULATORY_CARE_PROVIDER_SITE_OTHER): Payer: Medicare Other

## 2019-04-23 VITALS — BP 132/76 | HR 99 | Temp 97.3°F | Resp 17 | Ht 63.0 in | Wt 167.0 lb

## 2019-04-23 DIAGNOSIS — Z0000 Encounter for general adult medical examination without abnormal findings: Secondary | ICD-10-CM | POA: Diagnosis not present

## 2019-04-23 NOTE — Patient Instructions (Signed)
Ellen [name] , Thank you for taking time to come for your Medicare Wellness Visit. I appreciate your ongoing commitment to your health goals. Please review the following plan we discussed and let me know if I can assist you in the future.   Screening recommendations/referrals: Colonoscopy: no longer required Mammogram: no longer required Bone Density: no longer required Recommended yearly ophthalmology/optometry visit for glaucoma screening and checkup Recommended yearly dental visit for hygiene and checkup  Vaccinations: Influenza vaccine: up to date Pneumococcal vaccine: up to date Tdap vaccine: due now Shingles vaccine: shingrix eligible     Advanced directives: Advance directive discussed with you today. Even though you declined this today please call our office should you change your mind and we can give you the proper paperwork for you to fill out.  Conditions/risks identified: none   Next appointment: Follow up in one year for your annual wellness visit   Preventive Care 65 Years and Older, Female Preventive care refers to lifestyle choices and visits with your health care provider that can promote health and wellness. What does preventive care include?  A yearly physical exam. This is also called an annual well check.  Dental exams once or twice a year.  Routine eye exams. Ask your health care provider how often you should have your eyes checked.  Personal lifestyle choices, including:  Daily care of your teeth and gums.  Regular physical activity.  Eating a healthy diet.  Avoiding tobacco and drug use.  Limiting alcohol use.  Practicing safe sex.  Taking low-dose aspirin every day.  Taking vitamin and mineral supplements as recommended by your health care provider. What happens during an annual well check? The services and screenings done by your health care provider during your annual well check will depend on your age, overall health, lifestyle risk  factors, and family history of disease. Counseling  Your health care provider may ask you questions about your:  Alcohol use.  Tobacco use.  Drug use.  Emotional well-being.  Home and relationship well-being.  Sexual activity.  Eating habits.  History of falls.  Memory and ability to understand (cognition).  Work and work Statistician.  Reproductive health. Screening  You may have the following tests or measurements:  Height, weight, and BMI.  Blood pressure.  Lipid and cholesterol levels. These may be checked every 5 years, or more frequently if you are over 51 years old.  Skin check.  Lung cancer screening. You may have this screening every year starting at age 68 if you have a 30-pack-year history of smoking and currently smoke or have quit within the past 15 years.  Fecal occult blood test (FOBT) of the stool. You may have this test every year starting at age 44.  Flexible sigmoidoscopy or colonoscopy. You may have a sigmoidoscopy every 5 years or a colonoscopy every 10 years starting at age 1.  Hepatitis C blood test.  Hepatitis B blood test.  Sexually transmitted disease (STD) testing.  Diabetes screening. This is done by checking your blood sugar (glucose) after you have not eaten for a while (fasting). You may have this done every 1-3 years.  Bone density scan. This is done to screen for osteoporosis. You may have this done starting at age 87.  Mammogram. This may be done every 1-2 years. Talk to your health care provider about how often you should have regular mammograms. Talk with your health care provider about your test results, treatment options, and if necessary, the need for more  tests. Vaccines  Your health care provider may recommend certain vaccines, such as:  Influenza vaccine. This is recommended every year.  Tetanus, diphtheria, and acellular pertussis (Tdap, Td) vaccine. You may need a Td booster every 10 years.  Zoster vaccine. You  may need this after age 88.  Pneumococcal 13-valent conjugate (PCV13) vaccine. One dose is recommended after age 56.  Pneumococcal polysaccharide (PPSV23) vaccine. One dose is recommended after age 34. Talk to your health care provider about which screenings and vaccines you need and how often you need them. This information is not intended to replace advice given to you by your health care provider. Make sure you discuss any questions you have with your health care provider. Document Released: 07/02/2015 Document Revised: 02/23/2016 Document Reviewed: 04/06/2015 Elsevier Interactive Patient Education  2017 [place] Prevention in the Home Falls can cause injuries. They can happen to people of all ages. There are many things you can do to make your home safe and to help prevent falls. What can I do on the outside of my home?  Regularly fix the edges of walkways and driveways and fix any cracks.  Remove anything that might make you trip as you walk through a door, such as a raised step or threshold.  Trim any bushes or trees on the path to your home.  Use bright outdoor lighting.  Clear any walking paths of anything that might make someone trip, such as rocks or tools.  Regularly check to see if handrails are loose or broken. Make sure that both sides of any steps have handrails.  Any raised decks and porches should have guardrails on the edges.  Have any leaves, snow, or ice cleared regularly.  Use sand or salt on walking paths during winter.  Clean up any spills in your garage right away. This includes oil or grease spills. What can I do in the bathroom?  Use night lights.  Install grab bars by the toilet and in the tub and shower. Do not use towel bars as grab bars.  Use non-skid mats or decals in the tub or shower.  If you need to sit down in the shower, use a plastic, non-slip stool.  Keep the floor dry. Clean up any water that spills on the floor as soon as  it happens.  Remove soap buildup in the tub or shower regularly.  Attach bath mats securely with double-sided non-slip rug tape.  Do not have throw rugs and other things on the floor that can make you trip. What can I do in the bedroom?  Use night lights.  Make sure that you have a light by your bed that is easy to reach.  Do not use any sheets or blankets that are too big for your bed. They should not hang down onto the floor.  Have a firm chair that has side arms. You can use this for support while you get dressed.  Do not have throw rugs and other things on the floor that can make you trip. What can I do in the kitchen?  Clean up any spills right away.  Avoid walking on wet floors.  Keep items that you use a lot in easy-to-reach places.  If you need to reach something above you, use a strong step stool that has a grab bar.  Keep electrical cords out of the way.  Do not use floor polish or wax that makes floors slippery. If you must use wax, use non-skid floor  wax.  Do not have throw rugs and other things on the floor that can make you trip. What can I do with my stairs?  Do not leave any items on the stairs.  Make sure that there are handrails on both sides of the stairs and use them. Fix handrails that are broken or loose. Make sure that handrails are as long as the stairways.  Check any carpeting to make sure that it is firmly attached to the stairs. Fix any carpet that is loose or worn.  Avoid having throw rugs at the top or bottom of the stairs. If you do have throw rugs, attach them to the floor with carpet tape.  Make sure that you have a light switch at the top of the stairs and the bottom of the stairs. If you do not have them, ask someone to add them for you. What else can I do to help prevent falls?  Wear shoes that:  Do not have high heels.  Have rubber bottoms.  Are comfortable and fit you well.  Are closed at the toe. Do not wear sandals.  If  you use a stepladder:  Make sure that it is fully opened. Do not climb a closed stepladder.  Make sure that both sides of the stepladder are locked into place.  Ask someone to hold it for you, if possible.  Clearly mark and make sure that you can see:  Any grab bars or handrails.  First and last steps.  Where the edge of each step is.  Use tools that help you move around (mobility aids) if they are needed. These include:  Canes.  Walkers.  Scooters.  Crutches.  Turn on the lights when you go into a dark area. Replace any light bulbs as soon as they burn out.  Set up your furniture so you have a clear path. Avoid moving your furniture around.  If any of your floors are uneven, fix them.  If there are any pets around you, be aware of where they are.  Review your medicines with your doctor. Some medicines can make you feel dizzy. This can increase your chance of falling. Ask your doctor what other things that you can do to help prevent falls. This information is not intended to replace advice given to you by your health care provider. Make sure you discuss any questions you have with your health care provider. Document Released: 04/01/2009 Document Revised: 11/11/2015 Document Reviewed: 07/10/2014 Elsevier Interactive Patient Education  2017 [employer].

## 2019-04-23 NOTE — Progress Notes (Signed)
Subjective:   Ellen [name] is a 78 y.o. female who presents for Medicare Annual (Subsequent) preventive examination.  Review of Systems:   Cardiac Risk Factors include: advanced age (>59men, >68 women);dyslipidemia;hypertension     Objective:     Vitals: BP 132/76 (BP Location: Right Arm, Patient Position: Sitting, Cuff Size: Normal)   Pulse 99   Temp (!) 97.3 F (36.3 C) (Temporal)   Resp 17   Ht 5\' 3"  (1.6 m)   Wt 167 lb (75.8 kg)   LMP 11/28/1994 (Approximate)   SpO2 99%   BMI 29.58 kg/m   Body mass index is 29.58 kg/m.  Advanced Directives 04/23/2019 04/15/2018 04/13/2017 04/03/2016 03/30/2015 03/30/2015  Does Patient Have a Medical Advance Directive? No No No Yes No Yes  Type of Advance Directive - - - Living will;Healthcare Power of Attorney - Living will  Would patient like information on creating a medical advance directive? - Yes (MAU/Ambulatory/Procedural Areas - Information given) Yes (MAU/Ambulatory/Procedural Areas - Information given) - Yes - Educational materials given -    Tobacco Social History   Tobacco Use  Smoking Status Never Smoker  Smokeless Tobacco Never Used     Counseling given: Not Answered   Clinical Intake:  Pre-visit preparation completed: Yes  Pain : No/denies pain     Nutritional Risks: None Diabetes: No  How often do you need to have someone help you when you read instructions, pamphlets, or other written materials from your doctor or pharmacy?: 1 - Never  Interpreter Needed?: No  Information entered by :: Tiffany HIll,LPN  Past Medical History:  Diagnosis Date  . Hyperlipidemia   . Hypertension   . Lumbago   . Menopause   . Migraines    Past Surgical History:  Procedure Laterality Date  . CHOLECYSTECTOMY  1982  . TUBAL LIGATION  1984   Family History  Problem Relation Age of Onset  . Alzheimer's disease Mother   . Diabetes Mother   . Stroke Mother   . Hyperlipidemia Mother   . Stroke Father   .  Hypertension Father   . Hyperlipidemia Father   . Diabetes Sister   . Diabetes Brother   . Hypertension Brother   . Diabetes Sister    Social History   Socioeconomic History  . Marital status: Married    Spouse name: Not on file  . Number of children: Not on file  . Years of education: some college  . Highest education level: Some college, no degree  Occupational History  . Occupation: retired  Scientific laboratory technician  . Financial resource strain: Not hard at all  . Food insecurity    Worry: Never true    Inability: Never true  . Transportation needs    Medical: No    Non-medical: No  Tobacco Use  . Smoking status: Never Smoker  . Smokeless tobacco: Never Used  Substance and Sexual Activity  . Alcohol use: No    Alcohol/week: 0.0 standard drinks  . Drug use: No  . Sexual activity: Yes  Lifestyle  . Physical activity    Days per week: 0 days    Minutes per session: 0 min  . Stress: Patient refused  Relationships  . Social connections    Talks on phone: More than three times a week    Gets together: More than three times a week    Attends religious service: More than 4 times per year    Active member of club or organization: Yes  Attends meetings of clubs or organizations: More than 4 times per year    Relationship status: Married  Other Topics Concern  . Not on file  Social History Narrative  . Not on file    Outpatient Encounter Medications as of 04/23/2019  Medication Sig  . amLODipine (NORVASC) 5 MG tablet Take 1 tablet by mouth once daily  . aspirin 81 MG tablet Take 81 mg by mouth daily.  Marland Kitchen atorvastatin (LIPITOR) 20 MG tablet Take 1 tablet by mouth once daily  . calcium-vitamin D 250-100 MG-UNIT tablet Take 1 tablet by mouth 2 (two) times daily.  . Cholecalciferol (VITAMIN D3) 1000 units CAPS Take 2 capsules by mouth daily.   Marland Kitchen lisinopril-hydrochlorothiazide (ZESTORETIC) 20-25 MG tablet Take 1 tablet by mouth daily.  . Magnesium 500 MG TABS Take 1 tablet by mouth  daily.  . vitamin C (ASCORBIC ACID) 500 MG tablet Take 500 mg by mouth daily.  . [DISCONTINUED] Omega-3 1000 MG CAPS Take by mouth daily.   No facility-administered encounter medications on file as of 04/23/2019.     Activities of Daily Living In your present state of health, do you have any difficulty performing the following activities: 04/23/2019  Hearing? N  Comment no hearing aids  Vision? N  Comment eyeglasses, goes to my eye dr  Difficulty concentrating or making decisions? N  Walking or climbing stairs? N  Dressing or bathing? N  Doing errands, shopping? N  Preparing Food and eating ? N  Using the Toilet? N  In the past six months, have you accidently leaked urine? N  Do you have problems with loss of bowel control? N  Managing your Medications? N  Managing your Finances? N  Housekeeping or managing your Housekeeping? N  Some recent data might be hidden    Patient Care Team: [name], PA-C as PCP - General (Family Medicine)    Assessment:   This is a routine wellness examination for [facility].  Exercise Activities and Dietary recommendations Current Exercise Habits: Home exercise routine, Type of exercise: walking, Time (Minutes): 60, Frequency (Times/Week): 2, Weekly Exercise (Minutes/Week): 120, Intensity: Mild, Exercise limited by: None identified  Goals    . Increase water intake     Recommend drinking at least 5-6 glasses of water day        Fall Risk: Fall Risk  04/23/2019 04/15/2018 10/03/2017 08/29/2017 07/26/2017  Falls in the past year? 0 No No No No  Number falls in past yr: 0 - - - -  Injury with Fall? 0 - - - -    FALL RISK PREVENTION PERTAINING TO THE HOME:  Any stairs in or around the home? Yes  If so, are there any without handrails? No   Home free of loose throw rugs in walkways, pet beds, electrical cords, etc? Yes  Adequate lighting in your home to reduce risk of falls? Yes   ASSISTIVE DEVICES UTILIZED TO PREVENT FALLS:  Life  alert? No  Use of a cane, walker or w/c? No  Grab bars in the bathroom? No  Shower chair or bench in shower? No  Elevated toilet seat or a handicapped toilet? No   DME ORDERS:  DME order needed?  No   TIMED UP AND GO:  Was the test performed? Yes .  Length of time to ambulate 10 feet: 8 sec.   GAIT:  Appearance of gait: Gait steady and fast without the use of an assistive device.  Education: Fall risk prevention has been  discussed.  Intervention(s) required? No   DME/home health order needed?  No    Depression Screen PHQ 2/9 Scores 04/23/2019 04/15/2018 10/03/2017 08/29/2017  PHQ - 2 Score 0 0 0 0  PHQ- 9 Score - - - -     Cognitive Function     6CIT Screen 04/23/2019 04/15/2018 04/13/2017  What Year? 0 points 0 points 0 points  What month? 0 points 0 points 0 points  What time? 0 points 0 points 0 points  Count back from 20 0 points 0 points 0 points  Months in reverse 0 points 0 points 0 points  Repeat phrase 0 points 2 points 0 points  Total Score 0 2 0    Immunization History  Administered Date(s) Administered  . Fluad Quad(high Dose 65+) 03/18/2019  . Influenza, High Dose Seasonal PF 03/06/2016, 04/13/2017, 04/15/2018  . Influenza,inj,Quad PF,6+ Mos 03/30/2015  . Pneumococcal Conjugate-13 02/02/2014  . Pneumococcal Polysaccharide-23 01/27/2013  . Td 07/21/2008    Qualifies for Shingles Vaccine? Yes  Zostavax completed n/a. Due for Shingrix. Education has been provided regarding the importance of this vaccine. Pt has been advised to call insurance company to determine out of pocket expense. Advised may also receive vaccine at local pharmacy or Health Dept. Verbalized acceptance and understanding.  Tdap: Discussed need for TD/TDAP vaccine, patient verbalized understanding that this is not covered as a preventative with there insurance and to call the office if she develops any new skin injuries, ie: cuts, scrapes, bug bites, or open wounds.  Flu Vaccine: up to  date   Pneumococcal Vaccine: up to date   Screening Tests Health Maintenance  Topic Date Due  . TETANUS/TDAP  07/21/2018  . COLONOSCOPY  03/25/2023  . INFLUENZA VACCINE  Completed  . DEXA SCAN  Completed  . PNA vac Low Risk Adult  Completed    Cancer Screenings:  Colorectal Screening: Completed 2014. Repeat every 10 years  Mammogram: Completed 06/05/2018.   Bone Density: Completed 08/04/2008   Lung Cancer Screening: (Low Dose CT Chest recommended if Age 58-80 years, 30 pack-year currently smoking OR have quit w/in 15years.) does not qualify.    Additional Screening:  Hepatitis C Screening: does not qualify  Vision Screening: Recommended annual ophthalmology exams for early detection of glaucoma and other disorders of the eye. Is the patient up to date with their annual eye exam?  Yes  Who is the provider or what is the name of the office in which the pt attends annual eye exams? [facility]   Dental Screening: Recommended annual dental exams for proper oral hygiene  Community Resource Referral:  CRR required this visit?  No       Plan:  I have personally reviewed and addressed the Medicare Annual Wellness questionnaire and have noted the following in the patient's chart:  A. Medical and social history B. Use of alcohol, tobacco or illicit drugs  C. Current medications and supplements D. Functional ability and status E.  Nutritional status F.  Physical activity G. Advance directives H. List of other physicians I.  Hospitalizations, surgeries, and ER visits in previous 12 months J.  Vitals K. Screenings such as hearing and vision if needed, cognitive and depression L. Referrals and appointments   In addition, I have reviewed and discussed with patient certain preventive protocols, quality metrics, and best practice recommendations. A written personalized care plan for preventive services as well as general preventive health recommendations were provided to patient.   Signed,  Collene Schlichter, [place]  16/06/958 Nurse Health Advisor   Nurse Notes:none

## 2019-04-30 ENCOUNTER — Encounter: Payer: Self-pay | Admitting: Family Medicine

## 2019-04-30 ENCOUNTER — Telehealth: Payer: Self-pay | Admitting: Family Medicine

## 2019-04-30 ENCOUNTER — Ambulatory Visit (INDEPENDENT_AMBULATORY_CARE_PROVIDER_SITE_OTHER): Payer: Medicare Other | Admitting: Family Medicine

## 2019-04-30 ENCOUNTER — Other Ambulatory Visit: Payer: Self-pay

## 2019-04-30 VITALS — BP 136/78 | HR 86 | Temp 98.8°F | Ht 63.0 in | Wt 162.0 lb

## 2019-04-30 DIAGNOSIS — M542 Cervicalgia: Secondary | ICD-10-CM

## 2019-04-30 DIAGNOSIS — G8929 Other chronic pain: Secondary | ICD-10-CM | POA: Diagnosis not present

## 2019-04-30 DIAGNOSIS — M545 Low back pain: Secondary | ICD-10-CM | POA: Diagnosis not present

## 2019-04-30 MED ORDER — PREDNISONE 10 MG PO TABS: ORAL_TABLET | ORAL | 0 refills | Status: DC

## 2019-04-30 MED ORDER — CYCLOBENZAPRINE HCL 5 MG PO TABS: 5.0000 mg | ORAL_TABLET | Freq: Three times a day (TID) | ORAL | 0 refills | Status: DC | PRN

## 2019-04-30 NOTE — Assessment & Plan Note (Signed)
Prednisone taper, flexeril prn, and she will call [facility] to reschedule her appt for epidural injection ASAP. Heating pad, massage and other supportive care reviewed

## 2019-04-30 NOTE — Patient Instructions (Signed)
[place] [phone]

## 2019-04-30 NOTE — Telephone Encounter (Signed)
Pt seen in office this morning.  ?

## 2019-04-30 NOTE — Telephone Encounter (Signed)
Pt is here stating that her muscles and shoulder and back are hurting and would like something prescribed.Pt states she is using [facility] on [address]. Please advise.

## 2019-04-30 NOTE — Progress Notes (Signed)
BP 136/78   Pulse 86   Temp 98.8 F (37.1 C) (Oral)   Ht 5\' 3"  (1.6 m)   Wt 162 lb (73.5 kg)   LMP 11/28/1994 (Approximate)   SpO2 96%   BMI 28.70 kg/m    Subjective:    Patient ID: Ellen [name], female    DOB: 10-05-1940, 78 y.o.   MRN: [id number]  HPI: Ellen [name] is a 78 y.o. female  Chief Complaint  Patient presents with  . Shoulder Pain    bilateral. since last Friday. pt states she can't sleep because of the pain. has tried OTC ibuprofen  . Neck Pain  . Back Pain    upper back   Significant neck and low back soreness x 4-5 days. States she was doing housework, nothing out of the ordinary prior to onset. Muscles tense, sore, aching and sharp with movement. Nothing seems to alleviate sxs. Trying NSAIDs without relief. Previously followed by [facility] and was set to do an epidural injection for 10/2018 but this was put on hold due to COVID restrictions. Denies radiation down legs, fevers, chills, incontinence, new injury, numbness or tingling.   Relevant past medical, surgical, family and social history reviewed and updated as indicated. Interim medical history since our last visit reviewed. Allergies and medications reviewed and updated.  Review of Systems  Per HPI unless specifically indicated above     Objective:    BP 136/78   Pulse 86   Temp 98.8 F (37.1 C) (Oral)   Ht 5\' 3"  (1.6 m)   Wt 162 lb (73.5 kg)   LMP 11/28/1994 (Approximate)   SpO2 96%   BMI 28.70 kg/m   Wt Readings from Last 3 Encounters:  04/30/19 162 lb (73.5 kg)  04/23/19 167 lb (75.8 kg)  11/08/18 168 lb 3.2 oz (76.3 kg)    Physical Exam Vitals signs and nursing note reviewed.  Constitutional:      Appearance: Normal appearance. She is not ill-appearing.  HENT:     Head: Atraumatic.  Eyes:     Extraocular Movements: Extraocular movements intact.     Conjunctiva/sclera: Conjunctivae normal.  Neck:     Musculoskeletal: Normal range of motion and neck supple.   Cardiovascular:     Rate and Rhythm: Normal rate and regular rhythm.     Heart sounds: Normal heart sounds.  Pulmonary:     Effort: Pulmonary effort is normal.     Breath sounds: Normal breath sounds.  Musculoskeletal:     Comments: Antalgic gait and movements B/l trapezius muscles ttp and in mild spasm B/l lumbar paraspinal muscles ttp  Skin:    General: Skin is warm and dry.  Neurological:     Mental Status: She is alert and oriented to person, place, and time.  Psychiatric:        Mood and Affect: Mood normal.        Thought Content: Thought content normal.        Judgment: Judgment normal.     Results for orders placed or performed in visit on 11/08/18  Comprehensive metabolic panel  Result Value Ref Range   Glucose 107 (H) 65 - 99 mg/dL   BUN 16 8 - 27 mg/dL   Creatinine, Ser 0.76 0.57 - 1.00 mg/dL   GFR calc non Af Amer 77 >59 mL/min/1.73   GFR calc Af Amer 89 >59 mL/min/1.73   BUN/Creatinine Ratio 21 12 - 28   Sodium 141 134 - 144 mmol/L  Potassium 3.6 3.5 - 5.2 mmol/L   Chloride 101 96 - 106 mmol/L   CO2 26 20 - 29 mmol/L   Calcium 9.7 8.7 - 10.3 mg/dL   Total Protein 6.4 6.0 - 8.5 g/dL   Albumin 4.2 3.7 - 4.7 g/dL   Globulin, Total 2.2 1.5 - 4.5 g/dL   Albumin/Globulin Ratio 1.9 1.2 - 2.2   Bilirubin Total <0.2 0.0 - 1.2 mg/dL   Alkaline Phosphatase 75 39 - 117 IU/L   AST 22 0 - 40 IU/L   ALT 18 0 - 32 IU/L  Lipid Panel w/o Chol/HDL Ratio  Result Value Ref Range   Cholesterol, Total 124 100 - 199 mg/dL   Triglycerides 401 0 - 149 mg/dL   HDL 47 >02 mg/dL   VLDL Cholesterol Cal 23 5 - 40 mg/dL   LDL Calculated 54 0 - 99 mg/dL  HgB V2Z  Result Value Ref Range   Hgb A1c MFr Bld 6.0 (H) 4.8 - 5.6 %   Est. average glucose Bld gHb Est-mCnc 126 mg/dL      Assessment & Plan:   Problem List Items Addressed This Visit      Other   Back pain - Primary    Prednisone taper, flexeril prn, and she will call [facility] to reschedule her appt for epidural  injection ASAP. Heating pad, massage and other supportive care reviewed      Relevant Medications   predniSONE (DELTASONE) 10 MG tablet   cyclobenzaprine (FLEXERIL) 5 MG tablet    Other Visit Diagnoses    Neck pain           Follow up plan: Return for as scheduled.

## 2019-05-21 ENCOUNTER — Encounter: Payer: Self-pay | Admitting: Family Medicine

## 2019-05-21 ENCOUNTER — Ambulatory Visit (INDEPENDENT_AMBULATORY_CARE_PROVIDER_SITE_OTHER): Payer: Medicare Other | Admitting: Family Medicine

## 2019-05-21 ENCOUNTER — Other Ambulatory Visit: Payer: Self-pay

## 2019-05-21 VITALS — BP 150/87 | HR 91 | Temp 98.9°F | Ht 61.0 in | Wt 163.2 lb

## 2019-05-21 DIAGNOSIS — R7301 Impaired fasting glucose: Secondary | ICD-10-CM

## 2019-05-21 DIAGNOSIS — Z0000 Encounter for general adult medical examination without abnormal findings: Secondary | ICD-10-CM | POA: Diagnosis not present

## 2019-05-21 DIAGNOSIS — E782 Mixed hyperlipidemia: Secondary | ICD-10-CM | POA: Diagnosis not present

## 2019-05-21 DIAGNOSIS — I700 Atherosclerosis of aorta: Secondary | ICD-10-CM | POA: Diagnosis not present

## 2019-05-21 DIAGNOSIS — I10 Essential (primary) hypertension: Secondary | ICD-10-CM | POA: Diagnosis not present

## 2019-05-21 DIAGNOSIS — R8271 Bacteriuria: Secondary | ICD-10-CM | POA: Diagnosis not present

## 2019-05-21 NOTE — Progress Notes (Signed)
BP (!) 150/87   Pulse 91   Temp 98.9 F (37.2 C) (Oral)   Ht 5\' 1"  (1.549 m)   Wt 163 lb 3.2 oz (74 kg)   LMP 11/28/1994 (Approximate)   SpO2 99%   BMI 30.84 kg/m    Subjective:    Patient ID: 01/28/1995, female    DOB: [birth year], 78 y.o.   MRN: [id number]  HPI: Ellen [name] is a 78 y.o. female presenting on 05/21/2019 for comprehensive medical examination. Current medical complaints include:see below  HTN - Does not check home BPs. Has access via family members to check at home. Taking medicines faithfully without side effects. Denies CP, SOB, HAs, dizziness.   IFG - Diet controlled. Not following particular diet or exercising formally. Does stay active. Denies sxs.   HLD, aortic atherosclerosis - on lipitor, tolerating well without myalgias, claudication.   She currently lives with: Menopausal Symptoms: no  Depression Screen done today and results listed below:  Depression screen [facility] 2/9 05/21/2019 04/23/2019 04/15/2018 10/03/2017 08/29/2017  Decreased Interest 0 0 0 0 0  Down, Depressed, Hopeless 0 0 0 0 0  PHQ - 2 Score 0 0 0 0 0  Altered sleeping - - - - -  Tired, decreased energy - - - - -  Change in appetite - - - - -  Feeling bad or failure about yourself  - - - - -  Trouble concentrating - - - - -  Moving slowly or fidgety/restless - - - - -  Suicidal thoughts - - - - -  PHQ-9 Score - - - - -  Difficult doing work/chores - - - - -    The patient does not have a history of falls. I did complete a risk assessment for falls. A plan of care for falls was documented.   Past Medical History:  Past Medical History:  Diagnosis Date  . Hyperlipidemia   . Hypertension   . Lumbago   . Menopause   . Migraines     Surgical History:  Past Surgical History:  Procedure Laterality Date  . CHOLECYSTECTOMY  1982  . TUBAL LIGATION  1984    Medications:  Current Outpatient Medications on File Prior to Visit  Medication Sig  . amLODipine (NORVASC) 5 MG  tablet Take 1 tablet by mouth once daily  . aspirin 81 MG tablet Take 81 mg by mouth daily.  . calcium-vitamin D 250-100 MG-UNIT tablet Take 1 tablet by mouth 2 (two) times daily.  . Cholecalciferol (VITAMIN D3) 1000 units CAPS Take 2 capsules by mouth daily.   . cyclobenzaprine (FLEXERIL) 5 MG tablet Take 1 tablet (5 mg total) by mouth 3 (three) times daily as needed for muscle spasms.  08/31/2017 lisinopril-hydrochlorothiazide (ZESTORETIC) 20-25 MG tablet Take 1 tablet by mouth daily.  . Magnesium 500 MG TABS Take 1 tablet by mouth daily.  . vitamin C (ASCORBIC ACID) 500 MG tablet Take 500 mg by mouth daily.   No current facility-administered medications on file prior to visit.     Allergies:  No Known Allergies  Social History:  Social History   Socioeconomic History  . Marital status: Married    Spouse name: Not on file  . Number of children: Not on file  . Years of education: some college  . Highest education level: Some college, no degree  Occupational History  . Occupation: retired  [employer]  . Financial resource strain: Not hard at all  . Food insecurity  Worry: Never true    Inability: Never true  . Transportation needs    Medical: No    Non-medical: No  Tobacco Use  . Smoking status: Never Smoker  . Smokeless tobacco: Never Used  Substance and Sexual Activity  . Alcohol use: No    Alcohol/week: 0.0 standard drinks  . Drug use: No  . Sexual activity: Yes  Lifestyle  . Physical activity    Days per week: 0 days    Minutes per session: 0 min  . Stress: Patient refused  Relationships  . Social connections    Talks on phone: More than three times a week    Gets together: More than three times a week    Attends religious service: More than 4 times per year    Active member of club or organization: Yes    Attends meetings of clubs or organizations: More than 4 times per year    Relationship status: Married  . Intimate partner violence    Fear of current or ex  partner: No    Emotionally abused: No    Physically abused: No    Forced sexual activity: No  Other Topics Concern  . Not on file  Social History Narrative  . Not on file   Social History   Tobacco Use  Smoking Status Never Smoker  Smokeless Tobacco Never Used   Social History   Substance and Sexual Activity  Alcohol Use No  . Alcohol/week: 0.0 standard drinks    Family History:  Family History  Problem Relation Age of Onset  . Alzheimer's disease Mother   . Diabetes Mother   . Stroke Mother   . Hyperlipidemia Mother   . Stroke Father   . Hypertension Father   . Hyperlipidemia Father   . Diabetes Sister   . Diabetes Brother   . Hypertension Brother   . Diabetes Sister     Past medical history, surgical history, medications, allergies, family history and social history reviewed with patient today and changes made to appropriate areas of the chart.   Review of Systems - General ROS: negative Psychological ROS: negative Ophthalmic ROS: negative ENT ROS: negative Allergy and Immunology ROS: negative Hematological and Lymphatic ROS: negative Endocrine ROS: negative Breast ROS: negative for breast lumps Respiratory ROS: no cough, shortness of breath, or wheezing Cardiovascular ROS: no chest pain or dyspnea on exertion Gastrointestinal ROS: no abdominal pain, change in bowel habits, or black or bloody stools Genito-Urinary ROS: no dysuria, trouble voiding, or hematuria Musculoskeletal ROS: negative Neurological ROS: no TIA or stroke symptoms Dermatological ROS: negative All other ROS negative except what is listed above and in the HPI.      Objective:    BP (!) 150/87   Pulse 91   Temp 98.9 F (37.2 C) (Oral)   Ht  (1.549 m)   Wt 163 lb 3.2 oz (74 kg)   LMP 11/28/1994 (Approximate)   SpO2 99%   BMI 30.84 kg/m   Wt Readings from Last 3 Encounters:  05/21/19 163 lb 3.2 oz (74 kg)  04/30/19 162 lb (73.5 kg)  04/23/19 167 lb (75.8 kg)    Physical  Exam Vitals signs and nursing note reviewed.  Constitutional:      General: She is not in acute distress.    Appearance: She is well-developed.  HENT:     Head: Atraumatic.     Right Ear: External ear normal.     Left Ear: External ear normal.  Nose: Nose normal.     Mouth/Throat:     Pharynx: No oropharyngeal exudate.  Eyes:     General: No scleral icterus.    Conjunctiva/sclera: Conjunctivae normal.     Pupils: Pupils are equal, round, and reactive to light.  Neck:     Musculoskeletal: Normal range of motion and neck supple.     Thyroid: No thyromegaly.  Cardiovascular:     Rate and Rhythm: Normal rate and regular rhythm.     Heart sounds: Normal heart sounds.  Pulmonary:     Effort: Pulmonary effort is normal. No respiratory distress.     Breath sounds: Normal breath sounds.  Chest:     Breasts:        Right: No mass, skin change or tenderness.        Left: No mass, skin change or tenderness.  Abdominal:     General: Bowel sounds are normal.     Palpations: Abdomen is soft. There is no mass.     Tenderness: There is no abdominal tenderness.  Genitourinary:    Comments: Declines GU exam Musculoskeletal: Normal range of motion.        General: No tenderness.  Lymphadenopathy:     Cervical: No cervical adenopathy.     Upper Body:     Right upper body: No axillary adenopathy.     Left upper body: No axillary adenopathy.  Skin:    General: Skin is warm and dry.     Findings: No rash.  Neurological:     Mental Status: She is alert and oriented to person, place, and time.     Cranial Nerves: No cranial nerve deficit.  Psychiatric:        Behavior: Behavior normal.     Results for orders placed or performed in visit on 05/21/19  Microscopic Examination   URINE  Result Value Ref Range   WBC, UA 0-5 0 - 5 /hpf   RBC None seen 0 - 2 /hpf   Epithelial Cells (non renal) 0-10 0 - 10 /hpf   Bacteria, UA Many (A) None seen/Few  Urine Culture, Reflex   URINE   Result Value Ref Range   Urine Culture, Routine Final report (A)    Organism ID, Bacteria Escherichia coli (A)    Antimicrobial Susceptibility Comment   CBC with Differential/Platelet out  Result Value Ref Range   WBC 5.7 3.4 - 10.8 x10E3/uL   RBC 4.20 3.77 - 5.28 x10E6/uL   Hemoglobin 11.5 11.1 - 15.9 g/dL   Hematocrit 16.135.3 09.634.0 - 46.6 %   MCV 84 79 - 97 fL   MCH 27.4 26.6 - 33.0 pg   MCHC 32.6 31.5 - 35.7 g/dL   RDW 04.513.5 40.911.7 - 81.115.4 %   Platelets 285 150 - 450 x10E3/uL   Neutrophils 58 Not Estab. %   Lymphs 30 Not Estab. %   Monocytes 7 Not Estab. %   Eos 4 Not Estab. %   Basos 1 Not Estab. %   Neutrophils Absolute 3.3 1.4 - 7.0 x10E3/uL   Lymphocytes Absolute 1.7 0.7 - 3.1 x10E3/uL   Monocytes Absolute 0.4 0.1 - 0.9 x10E3/uL   EOS (ABSOLUTE) 0.2 0.0 - 0.4 x10E3/uL   Basophils Absolute 0.1 0.0 - 0.2 x10E3/uL   Immature Granulocytes 0 Not Estab. %   Immature Grans (Abs) 0.0 0.0 - 0.1 x10E3/uL  Comprehensive metabolic panel  Result Value Ref Range   Glucose 108 (H) 65 - 99 mg/dL   BUN 12 8 -  27 mg/dL   Creatinine, Ser 0.76 0.57 - 1.00 mg/dL   GFR calc non Af Amer 77 >59 mL/min/1.73   GFR calc Af Amer 89 >59 mL/min/1.73   BUN/Creatinine Ratio 16 12 - 28   Sodium 142 134 - 144 mmol/L   Potassium 3.3 (L) 3.5 - 5.2 mmol/L   Chloride 100 96 - 106 mmol/L   CO2 26 20 - 29 mmol/L   Calcium 9.6 8.7 - 10.3 mg/dL   Total Protein 6.6 6.0 - 8.5 g/dL   Albumin 3.9 3.7 - 4.7 g/dL   Globulin, Total 2.7 1.5 - 4.5 g/dL   Albumin/Globulin Ratio 1.4 1.2 - 2.2   Bilirubin Total <0.2 0.0 - 1.2 mg/dL   Alkaline Phosphatase 89 39 - 117 IU/L   AST 25 0 - 40 IU/L   ALT 18 0 - 32 IU/L  Lipid Panel w/o Chol/HDL Ratio out  Result Value Ref Range   Cholesterol, Total 176 100 - 199 mg/dL   Triglycerides 123 0 - 149 mg/dL   HDL 53 >39 mg/dL   VLDL Cholesterol Cal 22 5 - 40 mg/dL   LDL Chol Calc (NIH) 101 (H) 0 - 99 mg/dL  TSH  Result Value Ref Range   TSH 1.370 0.450 - 4.500 uIU/mL  UA/M  w/rflx Culture, Routine   Specimen: Urine   URINE  Result Value Ref Range   Specific Gravity, UA 1.015 1.005 - 1.030   pH, UA 7.0 5.0 - 7.5   Color, UA Yellow Yellow   Appearance Ur Clear Clear   Leukocytes,UA 1+ (A) Negative   Protein,UA Negative Negative/Trace   Glucose, UA Negative Negative   Ketones, UA Negative Negative   RBC, UA Negative Negative   Bilirubin, UA Negative Negative   Urobilinogen, Ur 0.2 0.2 - 1.0 mg/dL   Nitrite, UA Negative Negative   Microscopic Examination See below:    Urinalysis Reflex Comment   HgB A1c  Result Value Ref Range   Hgb A1c MFr Bld 6.5 (H) 4.8 - 5.6 %   Est. average glucose Bld gHb Est-mCnc 140 mg/dL      Assessment & Plan:   Problem List Items Addressed This Visit      Cardiovascular and Mediastinum   Hypertension - Primary    BPs mildly elevated today, discussed checking home readings the netx few weeks and calling with readings to see if dose needs to be increased. For now continue current regimen and work on [employer], exercise      Relevant Orders   CBC with Differential/Platelet out (Completed)   Comprehensive metabolic panel (Completed)   TSH (Completed)   UA/M w/rflx Culture, Routine (Completed)   Atherosclerosis of aorta (HCC)    Recheck lipids, continue lipitor and ASA.       Relevant Orders   Lipid Panel w/o Chol/HDL Ratio out (Completed)     Endocrine   IFG (impaired fasting glucose)    Recheck A1C, continue good lifestyle habits for control.       Relevant Orders   HgB A1c (Completed)     Other   Hyperlipidemia    Recheck lipids, adjust as needed. Continue current regimen and lifestyle modifications       Other Visit Diagnoses    Annual physical exam           Follow up plan: Return in about 6 months (around 11/19/2019) for 6 month f/u.   LABORATORY TESTING:  - Pap smear: not applicable  IMMUNIZATIONS:   -  Tdap: Tetanus vaccination status reviewed: postponed. - Influenza: Up to date -  Pneumovax: Up to date - Prevnar: Up to date - HPV: Not applicable - Zostavax vaccine: Up to date  SCREENING: -Mammogram: Up to date  - Colonoscopy: Up to date  - Bone Density: Up to date   PATIENT COUNSELING:   Advised to take 1 mg of folate supplement per day if capable of pregnancy.   Sexuality: Discussed sexually transmitted diseases, partner selection, use of condoms, avoidance of unintended pregnancy  and contraceptive alternatives.   Advised to avoid cigarette smoking.  I discussed with the patient that most people either abstain from alcohol or drink within safe limits (<=14/week and <=4 drinks/occasion for males, <=7/weeks and <= 3 drinks/occasion for females) and that the risk for alcohol disorders and other health effects rises proportionally with the number of drinks per week and how often a drinker exceeds daily limits.  Discussed cessation/primary prevention of drug use and availability of treatment for abuse.   Diet: Encouraged to adjust caloric intake to maintain  or achieve ideal body weight, to reduce intake of dietary saturated fat and total fat, to limit sodium intake by avoiding high sodium foods and not adding table salt, and to maintain adequate dietary potassium and calcium preferably from fresh fruits, vegetables, and low-fat dairy products.    stressed the importance of regular exercise  Injury prevention: Discussed safety belts, safety helmets, smoke detector, smoking near bedding or upholstery.   Dental health: Discussed importance of regular tooth brushing, flossing, and dental visits.    NEXT PREVENTATIVE PHYSICAL DUE IN 1 YEAR. Return in about 6 months (around 11/19/2019) for 6 month f/u.

## 2019-05-22 ENCOUNTER — Ambulatory Visit: Payer: Medicare Other | Admitting: Family Medicine

## 2019-05-22 LAB — COMPREHENSIVE METABOLIC PANEL
ALT: 18 IU/L (ref 0–32)
AST: 25 IU/L (ref 0–40)
Albumin/Globulin Ratio: 1.4 (ref 1.2–2.2)
Albumin: 3.9 g/dL (ref 3.7–4.7)
Alkaline Phosphatase: 89 IU/L (ref 39–117)
BUN/Creatinine Ratio: 16 (ref 12–28)
BUN: 12 mg/dL (ref 8–27)
Bilirubin Total: <0.2 mg/dL (ref 0.0–1.2)
CO2: 26 mmol/L (ref 20–29)
Calcium: 9.6 mg/dL (ref 8.7–10.3)
Chloride: 100 mmol/L (ref 96–106)
Creatinine, Ser: 0.76 mg/dL (ref 0.57–1.00)
GFR calc Af Amer: 89 mL/min/{1.73_m2} (ref >59)
GFR calc non Af Amer: 77 mL/min/{1.73_m2} (ref >59)
Globulin, Total: 2.7 g/dL (ref 1.5–4.5)
Glucose: 108 mg/dL — ABNORMAL HIGH (ref 65–99)
Potassium: 3.3 mmol/L — ABNORMAL LOW (ref 3.5–5.2)
Sodium: 142 mmol/L (ref 134–144)
Total Protein: 6.6 g/dL (ref 6.0–8.5)

## 2019-05-22 LAB — HEMOGLOBIN A1C
Est. average glucose Bld gHb Est-mCnc: 140 mg/dL
Hgb A1c MFr Bld: 6.5 % — ABNORMAL HIGH (ref 4.8–5.6)

## 2019-05-22 LAB — CBC WITH DIFFERENTIAL/PLATELET
Basophils Absolute: 0.1 10*3/uL (ref 0.0–0.2)
Basos: 1 %
EOS (ABSOLUTE): 0.2 10*3/uL (ref 0.0–0.4)
Eos: 4 %
Hematocrit: 35.3 % (ref 34.0–46.6)
Hemoglobin: 11.5 g/dL (ref 11.1–15.9)
Immature Grans (Abs): 0 10*3/uL (ref 0.0–0.1)
Immature Granulocytes: 0 %
Lymphocytes Absolute: 1.7 10*3/uL (ref 0.7–3.1)
Lymphs: 30 %
MCH: 27.4 pg (ref 26.6–33.0)
MCHC: 32.6 g/dL (ref 31.5–35.7)
MCV: 84 fL (ref 79–97)
Monocytes Absolute: 0.4 10*3/uL (ref 0.1–0.9)
Monocytes: 7 %
Neutrophils Absolute: 3.3 10*3/uL (ref 1.4–7.0)
Neutrophils: 58 %
Platelets: 285 10*3/uL (ref 150–450)
RBC: 4.2 x10E6/uL (ref 3.77–5.28)
RDW: 13.5 % (ref 11.7–15.4)
WBC: 5.7 10*3/uL (ref 3.4–10.8)

## 2019-05-22 LAB — LIPID PANEL W/O CHOL/HDL RATIO
Cholesterol, Total: 176 mg/dL (ref 100–199)
HDL: 53 mg/dL (ref >39)
LDL Chol Calc (NIH): 101 mg/dL — ABNORMAL HIGH (ref 0–99)
Triglycerides: 123 mg/dL (ref 0–149)
VLDL Cholesterol Cal: 22 mg/dL (ref 5–40)

## 2019-05-22 LAB — TSH: TSH: 1.37 u[IU]/mL (ref 0.450–4.500)

## 2019-05-23 ENCOUNTER — Other Ambulatory Visit: Payer: Self-pay | Admitting: Family Medicine

## 2019-05-24 LAB — URINE CULTURE, REFLEX

## 2019-05-24 LAB — UA/M W/RFLX CULTURE, ROUTINE
Bilirubin, UA: NEGATIVE
Glucose, UA: NEGATIVE
Ketones, UA: NEGATIVE
Nitrite, UA: NEGATIVE
Protein,UA: NEGATIVE
RBC, UA: NEGATIVE
Specific Gravity, UA: 1.015 (ref 1.005–1.030)
Urobilinogen, Ur: 0.2 mg/dL (ref 0.2–1.0)
pH, UA: 7 (ref 5.0–7.5)

## 2019-05-24 LAB — MICROSCOPIC EXAMINATION: RBC, Urine: NONE SEEN /hpf (ref 0–2)

## 2019-05-25 NOTE — Assessment & Plan Note (Signed)
Recheck lipids, continue lipitor and ASA.

## 2019-05-25 NOTE — Assessment & Plan Note (Signed)
Recheck lipids, adjust as needed. Continue current regimen and lifestyle modifications 

## 2019-05-25 NOTE — Assessment & Plan Note (Signed)
Recheck A1C, continue good lifestyle habits for control 

## 2019-05-25 NOTE — Assessment & Plan Note (Signed)
BPs mildly elevated today, discussed checking home readings the netx few weeks and calling with readings to see if dose needs to be increased. For now continue current regimen and work on [employer], exercise

## 2019-05-26 ENCOUNTER — Other Ambulatory Visit: Payer: Self-pay

## 2019-05-26 ENCOUNTER — Ambulatory Visit (INDEPENDENT_AMBULATORY_CARE_PROVIDER_SITE_OTHER): Payer: Medicare Other | Admitting: Family Medicine

## 2019-05-26 ENCOUNTER — Encounter: Payer: Self-pay | Admitting: Family Medicine

## 2019-05-26 ENCOUNTER — Other Ambulatory Visit: Payer: Self-pay | Admitting: Family Medicine

## 2019-05-26 VITALS — BP 136/79 | HR 82 | Temp 98.8°F | Ht 61.0 in | Wt 161.0 lb

## 2019-05-26 DIAGNOSIS — I10 Essential (primary) hypertension: Secondary | ICD-10-CM | POA: Diagnosis not present

## 2019-05-26 MED ORDER — AMLODIPINE BESYLATE 5 MG PO TABS: 5.0000 mg | ORAL_TABLET | Freq: Every day | ORAL | 1 refills | Status: DC

## 2019-05-26 MED ORDER — LISINOPRIL 20 MG PO TABS: 20.0000 mg | ORAL_TABLET | Freq: Every day | ORAL | 0 refills | Status: DC

## 2019-05-26 MED ORDER — SULFAMETHOXAZOLE-TRIMETHOPRIM 800-160 MG PO TABS: 1.0000 | ORAL_TABLET | Freq: Two times a day (BID) | ORAL | 0 refills | Status: DC

## 2019-05-26 NOTE — Assessment & Plan Note (Signed)
Will add 20 mg lisinopril in addition to lisinopril HCTZ to maximize dose and continue amlodipine. Continue working on salt restriction, exercise, stress reduction. Log home readings, call with persistent abnormals

## 2019-05-26 NOTE — Progress Notes (Signed)
BP 136/79   Pulse 82   Temp 98.8 F (37.1 C) (Oral)   Ht 5\' 1"  (1.549 m)   Wt 161 lb (73 kg)   LMP 11/28/1994 (Approximate)   SpO2 97%   BMI 30.42 kg/m    Subjective:    Patient ID: 01/28/1995, female    DOB: Oct 21, 1940, 78 y.o.   MRN: [id number]  HPI: Ellen [name] is a 78 y.o. female  Chief Complaint  Patient presents with  . Hypertension    pt states her BP is still high   Presenting today for concern that her BPs are still elevated. Had not been checking them at home up until her appt last week, where reading was mildly elevated. Started checking over weekend and getting 140s-160s/80s-90s consistently. Denies any CP, SOB, HAs, dizziness and taking medicines faithfully without side effects.   Relevant past medical, surgical, family and social history reviewed and updated as indicated. Interim medical history since our last visit reviewed. Allergies and medications reviewed and updated.  Review of Systems  Per HPI unless specifically indicated above     Objective:    BP 136/79   Pulse 82   Temp 98.8 F (37.1 C) (Oral)   Ht 5\' 1"  (1.549 m)   Wt 161 lb (73 kg)   LMP 11/28/1994 (Approximate)   SpO2 97%   BMI 30.42 kg/m   Wt Readings from Last 3 Encounters:  05/26/19 161 lb (73 kg)  05/21/19 163 lb 3.2 oz (74 kg)  04/30/19 162 lb (73.5 kg)    Physical Exam Vitals signs and nursing note reviewed.  Constitutional:      Appearance: Normal appearance. She is not ill-appearing.  HENT:     Head: Atraumatic.  Eyes:     Extraocular Movements: Extraocular movements intact.     Conjunctiva/sclera: Conjunctivae normal.  Neck:     Musculoskeletal: Normal range of motion and neck supple.  Cardiovascular:     Rate and Rhythm: Normal rate and regular rhythm.     Heart sounds: Normal heart sounds.  Pulmonary:     Effort: Pulmonary effort is normal.     Breath sounds: Normal breath sounds.  Musculoskeletal: Normal range of motion.  Skin:    General:  Skin is warm and dry.  Neurological:     Mental Status: She is alert and oriented to person, place, and time.  Psychiatric:        Mood and Affect: Mood normal.        Thought Content: Thought content normal.        Judgment: Judgment normal.     Results for orders placed or performed in visit on 05/21/19  Microscopic Examination   URINE  Result Value Ref Range   WBC, UA 0-5 0 - 5 /hpf   RBC None seen 0 - 2 /hpf   Epithelial Cells (non renal) 0-10 0 - 10 /hpf   Bacteria, UA Many (A) None seen/Few  Urine Culture, Reflex   URINE  Result Value Ref Range   Urine Culture, Routine Final report (A)    Organism ID, Bacteria Escherichia coli (A)    Antimicrobial Susceptibility Comment   CBC with Differential/Platelet out  Result Value Ref Range   WBC 5.7 3.4 - 10.8 x10E3/uL   RBC 4.20 3.77 - 5.28 x10E6/uL   Hemoglobin 11.5 11.1 - 15.9 g/dL   Hematocrit 13/11/20 14/02/20 - 46.6 %   MCV 84 79 - 97 fL   MCH 27.4 26.6 -  33.0 pg   MCHC 32.6 31.5 - 35.7 g/dL   RDW 96.013.5 45.411.7 - 09.815.4 %   Platelets 285 150 - 450 x10E3/uL   Neutrophils 58 Not Estab. %   Lymphs 30 Not Estab. %   Monocytes 7 Not Estab. %   Eos 4 Not Estab. %   Basos 1 Not Estab. %   Neutrophils Absolute 3.3 1.4 - 7.0 x10E3/uL   Lymphocytes Absolute 1.7 0.7 - 3.1 x10E3/uL   Monocytes Absolute 0.4 0.1 - 0.9 x10E3/uL   EOS (ABSOLUTE) 0.2 0.0 - 0.4 x10E3/uL   Basophils Absolute 0.1 0.0 - 0.2 x10E3/uL   Immature Granulocytes 0 Not Estab. %   Immature Grans (Abs) 0.0 0.0 - 0.1 x10E3/uL  Comprehensive metabolic panel  Result Value Ref Range   Glucose 108 (H) 65 - 99 mg/dL   BUN 12 8 - 27 mg/dL   Creatinine, Ser 1.190.76 0.57 - 1.00 mg/dL   GFR calc non Af Amer 77 >59 mL/min/1.73   GFR calc Af Amer 89 >59 mL/min/1.73   BUN/Creatinine Ratio 16 12 - 28   Sodium 142 134 - 144 mmol/L   Potassium 3.3 (L) 3.5 - 5.2 mmol/L   Chloride 100 96 - 106 mmol/L   CO2 26 20 - 29 mmol/L   Calcium 9.6 8.7 - 10.3 mg/dL   Total Protein 6.6 6.0 - 8.5  g/dL   Albumin 3.9 3.7 - 4.7 g/dL   Globulin, Total 2.7 1.5 - 4.5 g/dL   Albumin/Globulin Ratio 1.4 1.2 - 2.2   Bilirubin Total <0.2 0.0 - 1.2 mg/dL   Alkaline Phosphatase 89 39 - 117 IU/L   AST 25 0 - 40 IU/L   ALT 18 0 - 32 IU/L  Lipid Panel w/o Chol/HDL Ratio out  Result Value Ref Range   Cholesterol, Total 176 100 - 199 mg/dL   Triglycerides 147123 0 - 149 mg/dL   HDL 53 >82>39 mg/dL   VLDL Cholesterol Cal 22 5 - 40 mg/dL   LDL Chol Calc (NIH) 956101 (H) 0 - 99 mg/dL  TSH  Result Value Ref Range   TSH 1.370 0.450 - 4.500 uIU/mL  UA/M w/rflx Culture, Routine   Specimen: Urine   URINE  Result Value Ref Range   Specific Gravity, UA 1.015 1.005 - 1.030   pH, UA 7.0 5.0 - 7.5   Color, UA Yellow Yellow   Appearance Ur Clear Clear   Leukocytes,UA 1+ (A) Negative   Protein,UA Negative Negative/Trace   Glucose, UA Negative Negative   Ketones, UA Negative Negative   RBC, UA Negative Negative   Bilirubin, UA Negative Negative   Urobilinogen, Ur 0.2 0.2 - 1.0 mg/dL   Nitrite, UA Negative Negative   Microscopic Examination See below:    Urinalysis Reflex Comment   HgB A1c  Result Value Ref Range   Hgb A1c MFr Bld 6.5 (H) 4.8 - 5.6 %   Est. average glucose Bld gHb Est-mCnc 140 mg/dL      Assessment & Plan:   Problem List Items Addressed This Visit      Cardiovascular and Mediastinum   Hypertension - Primary    Will add 20 mg lisinopril in addition to lisinopril HCTZ to maximize dose and continue amlodipine. Continue working on salt restriction, exercise, stress reduction. Log home readings, call with persistent abnormals      Relevant Medications   lisinopril (ZESTRIL) 20 MG tablet   amLODipine (NORVASC) 5 MG tablet       Follow up plan:  Return in about 4 weeks (around 06/23/2019) for BP.

## 2019-05-26 NOTE — Patient Instructions (Signed)
Start taking 20 mg lisinopril tab alongside your current lisinopril hctz tab to make the higher dose. Keep everything else the same.

## 2019-05-29 ENCOUNTER — Ambulatory Visit (HOSPITAL_BASED_OUTPATIENT_CLINIC_OR_DEPARTMENT_OTHER): Payer: Medicare Other | Admitting: Anesthesiology

## 2019-05-29 ENCOUNTER — Other Ambulatory Visit: Payer: Self-pay | Admitting: Anesthesiology

## 2019-05-29 ENCOUNTER — Ambulatory Visit
Admission: RE | Admit: 2019-05-29 | Discharge: 2019-05-29 | Disposition: A | Discharge status: Home / Self Care | Payer: Medicare Other | Source: Ambulatory Visit | Attending: Anesthesiology | Admitting: Anesthesiology

## 2019-05-29 ENCOUNTER — Other Ambulatory Visit: Payer: Self-pay

## 2019-05-29 ENCOUNTER — Encounter: Payer: Self-pay | Admitting: Anesthesiology

## 2019-05-29 VITALS — BP 138/87 | HR 92 | Temp 97.0°F | Resp 17 | Ht 62.0 in | Wt 164.0 lb

## 2019-05-29 DIAGNOSIS — M5136 Other intervertebral disc degeneration, lumbar region: Secondary | ICD-10-CM | POA: Insufficient documentation

## 2019-05-29 DIAGNOSIS — M4716 Other spondylosis with myelopathy, lumbar region: Secondary | ICD-10-CM | POA: Insufficient documentation

## 2019-05-29 DIAGNOSIS — M48062 Spinal stenosis, lumbar region with neurogenic claudication: Secondary | ICD-10-CM | POA: Diagnosis not present

## 2019-05-29 DIAGNOSIS — R52 Pain, unspecified: Secondary | ICD-10-CM

## 2019-05-29 DIAGNOSIS — M549 Dorsalgia, unspecified: Secondary | ICD-10-CM | POA: Diagnosis not present

## 2019-05-29 DIAGNOSIS — M51369 Other intervertebral disc degeneration, lumbar region without mention of lumbar back pain or lower extremity pain: Secondary | ICD-10-CM

## 2019-05-29 DIAGNOSIS — M5432 Sciatica, left side: Secondary | ICD-10-CM | POA: Diagnosis not present

## 2019-05-29 MED ORDER — LIDOCAINE HCL (PF) 1 % IJ SOLN
5.0000 mL | Freq: Once | INTRAMUSCULAR | Status: AC
  Administered 2019-05-29: 5 mL via SUBCUTANEOUS

## 2019-05-29 MED ORDER — SODIUM CHLORIDE (PF) 0.9 % IJ SOLN
INTRAMUSCULAR | Status: AC
  Filled 2019-05-29: qty 10

## 2019-05-29 MED ORDER — IOPAMIDOL (ISOVUE-M 200) INJECTION 41%
20.0000 mL | Freq: Once | INTRAMUSCULAR | Status: DC | PRN
  Administered 2019-05-29: 20 mL

## 2019-05-29 MED ORDER — CYCLOBENZAPRINE HCL 5 MG PO TABS: 5.0000 mg | ORAL_TABLET | Freq: Every day | ORAL | 3 refills | Status: DC

## 2019-05-29 MED ORDER — CYCLOBENZAPRINE HCL 5 MG PO TABS: 5.0000 mg | ORAL_TABLET | Freq: Every day | ORAL | 3 refills | Status: AC

## 2019-05-29 MED ORDER — ROPIVACAINE HCL 2 MG/ML IJ SOLN
INTRAMUSCULAR | Status: AC
  Filled 2019-05-29: qty 10

## 2019-05-29 MED ORDER — TRIAMCINOLONE ACETONIDE 40 MG/ML IJ SUSP
40.0000 mg | Freq: Once | INTRAMUSCULAR | Status: AC
  Administered 2019-05-29: 40 mg

## 2019-05-29 MED ORDER — LIDOCAINE HCL (PF) 1 % IJ SOLN
INTRAMUSCULAR | Status: AC
  Filled 2019-05-29: qty 5

## 2019-05-29 MED ORDER — SODIUM CHLORIDE 0.9% FLUSH
10.0000 mL | Freq: Once | INTRAVENOUS | Status: AC
  Administered 2019-05-29: 10 mL

## 2019-05-29 MED ORDER — ROPIVACAINE HCL 2 MG/ML IJ SOLN
10.0000 mL | Freq: Once | INTRAMUSCULAR | Status: AC
  Administered 2019-05-29: 10 mL via EPIDURAL

## 2019-05-29 MED ORDER — TRIAMCINOLONE ACETONIDE 40 MG/ML IJ SUSP
INTRAMUSCULAR | Status: AC
  Filled 2019-05-29: qty 1

## 2019-05-29 NOTE — Progress Notes (Signed)
Safety precautions to be maintained throughout the outpatient stay will include: orient to surroundings, keep bed in low position, maintain call bell within reach at all times, provide assistance with transfer out of bed and ambulation.  

## 2019-06-18 ENCOUNTER — Telehealth: Payer: Self-pay | Admitting: Family Medicine

## 2019-06-18 MED ORDER — LISINOPRIL 20 MG PO TABS: 20.0000 mg | ORAL_TABLET | Freq: Every day | ORAL | 0 refills | Status: DC

## 2019-06-18 NOTE — Telephone Encounter (Signed)
Rx sent 

## 2019-06-18 NOTE — Telephone Encounter (Signed)
Pt,is here in office stating she needs a refill of her Lisinopril 20 MG.Pt is asking to be called at cell when sent [phone].

## 2019-06-19 NOTE — Telephone Encounter (Signed)
Pt understood and verbalized understanding.  

## 2019-06-23 ENCOUNTER — Telehealth: Payer: Self-pay | Admitting: Family Medicine

## 2019-06-23 NOTE — Chronic Care Management (AMB) (Signed)
  Chronic Care Management   Outreach Note  06/23/2019 Name: Ellen [name] MRN: [id number] DOB: 06/03/1941  Referred by: [name], PA-C Reason for referral : Chronic Care Management (Initial CCM outreach was unsuccessful.)   An unsuccessful telephone outreach was attempted today. The patient was referred to the case management team by for assistance with care management and care coordination.   Follow Up Plan: The care management team will reach out to the patient again over the next 7 days.   Randon Goldsmith Care Guide, Embedded Care Coordination [facility]  [place], [place] [address] Direct Dial: [phone] [email or web address]  [email or web address]: [email or web address]

## 2019-06-24 ENCOUNTER — Ambulatory Visit: Payer: Medicare Other | Attending: Internal Medicine

## 2019-06-24 DIAGNOSIS — Z20822 Contact with and (suspected) exposure to covid-19: Secondary | ICD-10-CM | POA: Diagnosis not present

## 2019-06-25 ENCOUNTER — Encounter: Payer: Self-pay | Admitting: Family Medicine

## 2019-06-25 ENCOUNTER — Ambulatory Visit (INDEPENDENT_AMBULATORY_CARE_PROVIDER_SITE_OTHER): Payer: Medicare Other | Admitting: Family Medicine

## 2019-06-25 ENCOUNTER — Other Ambulatory Visit: Payer: Self-pay

## 2019-06-25 VITALS — BP 124/82 | HR 93 | Wt 161.0 lb

## 2019-06-25 DIAGNOSIS — I10 Essential (primary) hypertension: Secondary | ICD-10-CM | POA: Diagnosis not present

## 2019-06-25 DIAGNOSIS — Z20822 Contact with and (suspected) exposure to covid-19: Secondary | ICD-10-CM | POA: Diagnosis not present

## 2019-06-25 MED ORDER — LISINOPRIL 20 MG PO TABS: 20.0000 mg | ORAL_TABLET | Freq: Every day | ORAL | 1 refills | Status: DC

## 2019-06-25 NOTE — Progress Notes (Signed)
BP 124/82   Pulse 93   Wt 161 lb (73 kg)   LMP 11/28/1994 (Approximate)   BMI 29.45 kg/m    Subjective:    Patient ID: Ellen [name], female    DOB: 02-08-1941, 79 y.o.   MRN: [id number]  HPI: Ellen [name] is a 79 y.o. female  Chief Complaint  Patient presents with  . Hypertension    . This visit was completed via telephone due to the restrictions of the COVID-19 pandemic. All issues as above were discussed and addressed. Physical exam was done as above through visual confirmation on telephone. If it was felt that the patient should be evaluated in the office, they were directed there. The patient verbally consented to this visit. . Location of the patient: home . Location of the provider: work . Those involved with this call:  . Provider: Merrie Roof, PA-C . CMA: Lesle Chris, [place] . Front Desk/Registration: Don Perking  . Time spent on call: 15 minutes with patient face to face via video conference. More than 50% of this time was spent in counseling and coordination of care. 5 minutes total spent in review of patient's record and preparation of their chart. I verified patient identity using two factors (patient name and date of birth). Patient consents verbally to being seen via telemedicine visit today.   Patient presenting today for 1 month HTN f/u after increasing lisinopril dose to 40 mg in addition to rest of regimen. Tolerating very well, and home BPs consistently 120s/80s. Denies CP, SOB, HAs, dizziness.   Husband recently dx'd with COVID so she got tested yesterday and has been isolating at home alongside of him. Asymptomatic at this time.   Relevant past medical, surgical, family and social history reviewed and updated as indicated. Interim medical history since our last visit reviewed. Allergies and medications reviewed and updated.  Review of Systems  Per HPI unless specifically indicated above     Objective:    BP 124/82   Pulse 93   Wt 161  lb (73 kg)   LMP 11/28/1994 (Approximate)   BMI 29.45 kg/m   Wt Readings from Last 3 Encounters:  06/25/19 161 lb (73 kg)  05/29/19 164 lb (74.4 kg)  05/26/19 161 lb (73 kg)    Physical Exam  Unable to perform PE due to patient lack of access to video technology for today's visit.   Results for orders placed or performed in visit on 05/21/19  Microscopic Examination   URINE  Result Value Ref Range   WBC, UA 0-5 0 - 5 /hpf   RBC None seen 0 - 2 /hpf   Epithelial Cells (non renal) 0-10 0 - 10 /hpf   Bacteria, UA Many (A) None seen/Few  Urine Culture, Reflex   URINE  Result Value Ref Range   Urine Culture, Routine Final report (A)    Organism ID, Bacteria Escherichia coli (A)    Antimicrobial Susceptibility Comment   CBC with Differential/Platelet out  Result Value Ref Range   WBC 5.7 3.4 - 10.8 x10E3/uL   RBC 4.20 3.77 - 5.28 x10E6/uL   Hemoglobin 11.5 11.1 - 15.9 g/dL   Hematocrit 35.3 34.0 - 46.6 %   MCV 84 79 - 97 fL   MCH 27.4 26.6 - 33.0 pg   MCHC 32.6 31.5 - 35.7 g/dL   RDW 13.5 11.7 - 15.4 %   Platelets 285 150 - 450 x10E3/uL   Neutrophils 58 Not Estab. %   Lymphs 30  Not Estab. %   Monocytes 7 Not Estab. %   Eos 4 Not Estab. %   Basos 1 Not Estab. %   Neutrophils Absolute 3.3 1.4 - 7.0 x10E3/uL   Lymphocytes Absolute 1.7 0.7 - 3.1 x10E3/uL   Monocytes Absolute 0.4 0.1 - 0.9 x10E3/uL   EOS (ABSOLUTE) 0.2 0.0 - 0.4 x10E3/uL   Basophils Absolute 0.1 0.0 - 0.2 x10E3/uL   Immature Granulocytes 0 Not Estab. %   Immature Grans (Abs) 0.0 0.0 - 0.1 x10E3/uL  Comprehensive metabolic panel  Result Value Ref Range   Glucose 108 (H) 65 - 99 mg/dL   BUN 12 8 - 27 mg/dL   Creatinine, Ser 9.37 0.57 - 1.00 mg/dL   GFR calc non Af Amer 77 >59 mL/min/1.73   GFR calc Af Amer 89 >59 mL/min/1.73   BUN/Creatinine Ratio 16 12 - 28   Sodium 142 134 - 144 mmol/L   Potassium 3.3 (L) 3.5 - 5.2 mmol/L   Chloride 100 96 - 106 mmol/L   CO2 26 20 - 29 mmol/L   Calcium 9.6 8.7 -  10.3 mg/dL   Total Protein 6.6 6.0 - 8.5 g/dL   Albumin 3.9 3.7 - 4.7 g/dL   Globulin, Total 2.7 1.5 - 4.5 g/dL   Albumin/Globulin Ratio 1.4 1.2 - 2.2   Bilirubin Total <0.2 0.0 - 1.2 mg/dL   Alkaline Phosphatase 89 39 - 117 IU/L   AST 25 0 - 40 IU/L   ALT 18 0 - 32 IU/L  Lipid Panel w/o Chol/HDL Ratio out  Result Value Ref Range   Cholesterol, Total 176 100 - 199 mg/dL   Triglycerides 342 0 - 149 mg/dL   HDL 53 >87 mg/dL   VLDL Cholesterol Cal 22 5 - 40 mg/dL   LDL Chol Calc (NIH) 681 (H) 0 - 99 mg/dL  TSH  Result Value Ref Range   TSH 1.370 0.450 - 4.500 uIU/mL  UA/M w/rflx Culture, Routine   Specimen: Urine   URINE  Result Value Ref Range   Specific Gravity, UA 1.015 1.005 - 1.030   pH, UA 7.0 5.0 - 7.5   Color, UA Yellow Yellow   Appearance Ur Clear Clear   Leukocytes,UA 1+ (A) Negative   Protein,UA Negative Negative/Trace   Glucose, UA Negative Negative   Ketones, UA Negative Negative   RBC, UA Negative Negative   Bilirubin, UA Negative Negative   Urobilinogen, Ur 0.2 0.2 - 1.0 mg/dL   Nitrite, UA Negative Negative   Microscopic Examination See below:    Urinalysis Reflex Comment   HgB A1c  Result Value Ref Range   Hgb A1c MFr Bld 6.5 (H) 4.8 - 5.6 %   Est. average glucose Bld gHb Est-mCnc 140 mg/dL      Assessment & Plan:   Problem List Items Addressed This Visit      Cardiovascular and Mediastinum   Hypertension - Primary    BPs significantly improved and now WNL, continue current regimen. Refills sent      Relevant Medications   lisinopril (ZESTRIL) 20 MG tablet    Other Visit Diagnoses    Exposure to COVID-19 virus       Just got tested, awaiting results. Quarantining alongside husband who is positive for 2 weeks. Call if becoming symptomatic       Follow up plan: No follow-ups on file.

## 2019-06-25 NOTE — Assessment & Plan Note (Signed)
BPs significantly improved and now WNL, continue current regimen. Refills sent

## 2019-06-26 ENCOUNTER — Ambulatory Visit: Payer: Medicare Other | Attending: Anesthesiology | Admitting: Anesthesiology

## 2019-06-26 ENCOUNTER — Encounter: Payer: Self-pay | Admitting: Anesthesiology

## 2019-06-26 DIAGNOSIS — M5432 Sciatica, left side: Secondary | ICD-10-CM

## 2019-06-26 DIAGNOSIS — M5136 Other intervertebral disc degeneration, lumbar region: Secondary | ICD-10-CM | POA: Diagnosis not present

## 2019-06-26 DIAGNOSIS — M4716 Other spondylosis with myelopathy, lumbar region: Secondary | ICD-10-CM | POA: Diagnosis not present

## 2019-06-26 DIAGNOSIS — M791 Myalgia, unspecified site: Secondary | ICD-10-CM

## 2019-06-26 DIAGNOSIS — M549 Dorsalgia, unspecified: Secondary | ICD-10-CM | POA: Diagnosis not present

## 2019-06-26 DIAGNOSIS — M48062 Spinal stenosis, lumbar region with neurogenic claudication: Secondary | ICD-10-CM

## 2019-06-26 NOTE — Progress Notes (Signed)
Subjective:  Patient ID: Ellen [name], female    DOB: 1940/07/11  Age: 79 y.o. MRN: [id number]  CC: Back Pain (low) and Leg Pain (left, posterior, below knee)   Procedure: L5-S1 epidural steroid under fluoroscopic guidance  HPI [name] presents for reevaluation.  She was last seen back in April and has been doing reasonably well until just recently.  She started having recurrence of low back pain similar to what she experienced in the past and she is had previous epidural steroid injections with good relief.  She presents today requesting a repeat epidural.  She is describing radiating symptoms from the low back down in the left posterior lateral leg and chronic low back pain.  This is an aching gnawing type of pain and the left leg pain is worse with prolonged standing and has been unremitting.  Typical stretching exercises or pain medication management have been unsuccessful in alleviating her pain.  She had previous epidural injections giving her good relief and she desires to proceed with this today.  Outpatient Medications Prior to Visit  Medication Sig Dispense Refill  . aspirin 81 MG tablet Take 81 mg by mouth daily.    Marland Kitchen atorvastatin (LIPITOR) 20 MG tablet Take 1 tablet by mouth once daily 90 tablet 0  . calcium-vitamin D 250-100 MG-UNIT tablet Take 1 tablet by mouth 2 (two) times daily.    . Cholecalciferol (VITAMIN D3) 1000 units CAPS Take 2 capsules by mouth daily.     Marland Kitchen lisinopril-hydrochlorothiazide (ZESTORETIC) 20-25 MG tablet Take 1 tablet by mouth daily. 90 tablet 3  . Magnesium 500 MG TABS Take 1 tablet by mouth daily.    . vitamin C (ASCORBIC ACID) 500 MG tablet Take 500 mg by mouth daily.    Marland Kitchen lisinopril (ZESTRIL) 20 MG tablet Take 1 tablet (20 mg total) by mouth daily. 30 tablet 0  . sulfamethoxazole-trimethoprim (BACTRIM DS) 800-160 MG tablet Take 1 tablet by mouth 2 (two) times daily. 6 tablet 0  . amLODipine (NORVASC) 5 MG tablet Take 1 tablet (5 mg  total) by mouth daily. 90 tablet 1  . cyclobenzaprine (FLEXERIL) 5 MG tablet Take 1 tablet (5 mg total) by mouth 3 (three) times daily as needed for muscle spasms. (Patient not taking: Reported on 05/29/2019) 30 tablet 0   No facility-administered medications prior to visit.    Review of Systems CNS: No confusion or sedation Cardiac: No angina or palpitations GI: No abdominal pain or constipation Constitutional: No nausea vomiting fevers or chills  Objective:  BP 138/87   Pulse 92   Temp (!) 97 F (36.1 C) (Temporal)   Resp 17   Ht 5\' 2"  (1.575 m)   Wt 164 lb (74.4 kg)   LMP 11/28/1994 (Approximate)   SpO2 100%   BMI 30.00 kg/m    BP Readings from Last 3 Encounters:  06/25/19 124/82  05/29/19 138/87  05/26/19 136/79     Wt Readings from Last 3 Encounters:  06/25/19 161 lb (73 kg)  05/29/19 164 lb (74.4 kg)  05/26/19 161 lb (73 kg)     Physical Exam Pt is alert and oriented PERRL EOMI HEART IS RRR no murmur or rub LCTA no wheezing or rales MUSCULOSKELETAL reveals a positive straight leg raise on the left side negative on the right with muscle tone and bulk at baseline.  She walks with an antalgic gait.  Labs  Lab Results  Component Value Date   HGBA1C 6.5 (H) 05/21/2019  HGBA1C 6.0 (H) 11/08/2018   HGBA1C 6.4 (H) 04/19/2018   Lab Results  Component Value Date   MICROALBUR 30 (H) 10/02/2016   LDLCALC 101 (H) 05/21/2019   CREATININE 0.76 05/21/2019    -------------------------------------------------------------------------------------------------------------------- Lab Results  Component Value Date   WBC 5.7 05/21/2019   HGB 11.5 05/21/2019   HCT 35.3 05/21/2019   PLT 285 05/21/2019   GLUCOSE 108 (H) 05/21/2019   CHOL 176 05/21/2019   TRIG 123 05/21/2019   HDL 53 05/21/2019   LDLCALC 101 (H) 05/21/2019   ALT 18 05/21/2019   AST 25 05/21/2019   NA 142 05/21/2019   K 3.3 (L) 05/21/2019   CL 100 05/21/2019   CREATININE 0.76 05/21/2019   BUN  12 05/21/2019   CO2 26 05/21/2019   TSH 1.370 05/21/2019   HGBA1C 6.5 (H) 05/21/2019   MICROALBUR 30 (H) 10/02/2016    --------------------------------------------------------------------------------------------------------------------- DG PAIN CLINIC C-ARM 1-60 MIN NO REPORT  Result Date: 05/29/2019 Fluoro was used, but no Radiologist interpretation will be provided. Please refer to "NOTES" tab for provider progress note.    Assessment & Plan:   Shealyn was seen today for back pain and leg pain.  Diagnoses and all orders for this visit:  Spinal stenosis of lumbar region with neurogenic claudication  Mid back pain on right side  Sciatica of left side  DDD (degenerative disc disease), lumbar  Spondylosis, lumbar, with myelopathy  Other orders -     Discontinue: cyclobenzaprine (FLEXERIL) 5 MG tablet; Take 1 tablet (5 mg total) by mouth at bedtime. -     cyclobenzaprine (FLEXERIL) 5 MG tablet; Take 1 tablet (5 mg total) by mouth at bedtime. -     triamcinolone acetonide (KENALOG-40) injection 40 mg -     sodium chloride flush (NS) 0.9 % injection 10 mL -     ropivacaine (PF) 2 mg/mL (0.2%) (NAROPIN) injection 10 mL -     lidocaine (PF) (XYLOCAINE) 1 % injection 5 mL -     Discontinue: iopamidol (ISOVUE-M) 41 % intrathecal injection 20 mL        ----------------------------------------------------------------------------------------------------------------------  Problem List Items Addressed This Visit    None    Visit Diagnoses    Spinal stenosis of lumbar region with neurogenic claudication    -  Primary   Relevant Medications   cyclobenzaprine (FLEXERIL) 5 MG tablet   triamcinolone acetonide (KENALOG-40) injection 40 mg (Completed)   ropivacaine (PF) 2 mg/mL (0.2%) (NAROPIN) injection 10 mL (Completed)   lidocaine (PF) (XYLOCAINE) 1 % injection 5 mL (Completed)   Mid back pain on right side       Relevant Medications   cyclobenzaprine (FLEXERIL) 5 MG tablet    triamcinolone acetonide (KENALOG-40) injection 40 mg (Completed)   Sciatica of left side       Relevant Medications   cyclobenzaprine (FLEXERIL) 5 MG tablet   DDD (degenerative disc disease), lumbar       Relevant Medications   cyclobenzaprine (FLEXERIL) 5 MG tablet   triamcinolone acetonide (KENALOG-40) injection 40 mg (Completed)   Spondylosis, lumbar, with myelopathy       Relevant Medications   cyclobenzaprine (FLEXERIL) 5 MG tablet   triamcinolone acetonide (KENALOG-40) injection 40 mg (Completed)        ----------------------------------------------------------------------------------------------------------------------  1. Spinal stenosis of lumbar region with neurogenic claudication We will proceed with a repeat epidural injection today.  The risks and benefits of been reviewed all questions answered and no guarantees are made.  We will have  her return to clinic in approximately 1 month for review.  I want her to continue with back stretching strengthening exercises as tolerated.  2. Mid back pain on right side As above  3. Sciatica of left side As above with continued stretching and walking with ambulation.  4. DDD (degenerative disc disease), lumbar As above  5. Spondylosis, lumbar, with myelopathy As above    ----------------------------------------------------------------------------------------------------------------------  I am having Arlita E. Veron maintain her aspirin, Vitamin D3, Magnesium, calcium-vitamin D, vitamin C, lisinopril-hydrochlorothiazide, atorvastatin, amLODipine, and cyclobenzaprine. We administered triamcinolone acetonide, sodium chloride flush, ropivacaine (PF) 2 mg/mL (0.2%), lidocaine (PF), and iopamidol.   Meds ordered this encounter  Medications  . DISCONTD: cyclobenzaprine (FLEXERIL) 5 MG tablet    Sig: Take 1 tablet (5 mg total) by mouth at bedtime.    Dispense:  30 tablet    Refill:  3  . cyclobenzaprine (FLEXERIL) 5 MG  tablet    Sig: Take 1 tablet (5 mg total) by mouth at bedtime.    Dispense:  30 tablet    Refill:  3  . triamcinolone acetonide (KENALOG-40) injection 40 mg  . sodium chloride flush (NS) 0.9 % injection 10 mL  . ropivacaine (PF) 2 mg/mL (0.2%) (NAROPIN) injection 10 mL  . lidocaine (PF) (XYLOCAINE) 1 % injection 5 mL  . DISCONTD: iopamidol (ISOVUE-M) 41 % intrathecal injection 20 mL   Patient's Medications  New Prescriptions   No medications on file  Previous Medications   AMLODIPINE (NORVASC) 5 MG TABLET    Take 1 tablet (5 mg total) by mouth daily.   ASPIRIN 81 MG TABLET    Take 81 mg by mouth daily.   ATORVASTATIN (LIPITOR) 20 MG TABLET    Take 1 tablet by mouth once daily   CALCIUM-VITAMIN D 250-100 MG-UNIT TABLET    Take 1 tablet by mouth 2 (two) times daily.   CHOLECALCIFEROL (VITAMIN D3) 1000 UNITS CAPS    Take 2 capsules by mouth daily.    LISINOPRIL (ZESTRIL) 20 MG TABLET    Take 1 tablet (20 mg total) by mouth daily.   LISINOPRIL-HYDROCHLOROTHIAZIDE (ZESTORETIC) 20-25 MG TABLET    Take 1 tablet by mouth daily.   MAGNESIUM 500 MG TABS    Take 1 tablet by mouth daily.   SULFAMETHOXAZOLE-TRIMETHOPRIM (BACTRIM DS) 800-160 MG TABLET    Take 1 tablet by mouth 2 (two) times daily.   VITAMIN C (ASCORBIC ACID) 500 MG TABLET    Take 500 mg by mouth daily.  Modified Medications   Modified Medication Previous Medication   CYCLOBENZAPRINE (FLEXERIL) 5 MG TABLET cyclobenzaprine (FLEXERIL) 5 MG tablet      Take 1 tablet (5 mg total) by mouth at bedtime.    Take 1 tablet (5 mg total) by mouth 3 (three) times daily as needed for muscle spasms.  Discontinued Medications   No medications on file   ----------------------------------------------------------------------------------------------------------------------  Follow-up: Return in about 2 months (around 07/30/2019) for evaluation, med refill.   Procedure: L5-S1 LESI with fluoroscopic guidance and no moderate sedation  NOTE: The risks,  benefits, and expectations of the procedure have been discussed and explained to the patient who was understanding and in agreement with suggested treatment plan. No guarantees were made.  DESCRIPTION OF PROCEDURE: Lumbar epidural steroid injection with no IV Versed, EKG, blood pressure, pulse, and pulse oximetry monitoring. The procedure was performed with the patient in the prone position under fluoroscopic guidance.  Sterile prep x3 was initiated and I then injected subcutaneous lidocaine  to the overlying L5-S1 site after its fluoroscopic identifictation.  Using strict aseptic technique, I then advanced an 18-gauge Tuohy epidural needle in the midline using interlaminar approach via loss-of-resistance to saline technique. There was negative aspiration for heme or  CSF.  I then confirmed position with both AP and Lateral fluoroscan.  2 cc of contrast dye were injected and a  total of 5 mL of Preservative-Free normal saline mixed with 40 mg of Kenalog and 1cc Ropicaine 0.2 percent were injected incrementally via the  epidurally placed needle. The needle was removed. The patient tolerated the injection well and was convalesced and discharged to home in stable condition. Should the patient have any post procedure difficulty they have been instructed on how to contact us for assistance.    Yevette Edwards, MD

## 2019-06-26 NOTE — Progress Notes (Signed)
Virtual Visit via Telephone Note  I connected with Ellen [name] on 06/26/19 at 11:30 AM EST by telephone and verified that I am speaking with the correct person using two identifiers.  Location: Patient: Home Provider: [facility] control center   I discussed the limitations, risks, security and privacy concerns of performing an evaluation and management service by telephone and the availability of in person appointments. I also discussed with the patient that there may be a patient responsible charge related to this service. The patient expressed understanding and agreed to proceed.   History of Present Illness: I spoke with Ellen [name] over the telephone today for her virtual visit.  She was unable to do the video conferencing portion with her phone or technology.  She reports that she did very well with her December epidural.  She has had complete resolution of her low back pain and leg pain at this point.  She is continuing to do her stretching strengthening exercises.  She is taking Flexeril periodically to help with some mild lumbar cramping.  Her strength is been good bowel bladder function has been good.  Otherwise she is in her usual state of health at this point.  Her husband does have Covid at this point and she is caring for him.    Observations/Objective:  Current Outpatient Medications:  .  amLODipine (NORVASC) 5 MG tablet, Take 1 tablet (5 mg total) by mouth daily., Disp: 90 tablet, Rfl: 1 .  aspirin 81 MG tablet, Take 81 mg by mouth daily., Disp: , Rfl:  .  atorvastatin (LIPITOR) 20 MG tablet, Take 1 tablet by mouth once daily, Disp: 90 tablet, Rfl: 0 .  calcium-vitamin D 250-100 MG-UNIT tablet, Take 1 tablet by mouth 2 (two) times daily., Disp: , Rfl:  .  Cholecalciferol (VITAMIN D3) 1000 units CAPS, Take 2 capsules by mouth daily. , Disp: , Rfl:  .  cyclobenzaprine (FLEXERIL) 5 MG tablet, Take 1 tablet (5 mg total) by mouth at bedtime., Disp: 30 tablet, Rfl: 3 .  lisinopril (ZESTRIL)  20 MG tablet, Take 1 tablet (20 mg total) by mouth daily. To be taken alongside lisinopril HCTZ 20-25mg  tab, Disp: 90 tablet, Rfl: 1 .  lisinopril-hydrochlorothiazide (ZESTORETIC) 20-25 MG tablet, Take 1 tablet by mouth daily., Disp: 90 tablet, Rfl: 3 .  Magnesium 500 MG TABS, Take 1 tablet by mouth daily., Disp: , Rfl:  .  vitamin C (ASCORBIC ACID) 500 MG tablet, Take 500 mg by mouth daily., Disp: , Rfl:   Assessment and Plan: 1. Spinal stenosis of lumbar region with neurogenic claudication   2. Mid back pain on right side   3. Sciatica of left side   4. DDD (degenerative disc disease), lumbar   5. Spondylosis, lumbar, with myelopathy   6. Muscle soreness   Based on our discussion today I am scheduling her for a 67-month return to clinic.  I have encouraged her to continue with stretching strengthening exercises and ambulation.  She is to continue follow-up with her primary care physician.  She can take her Flexeril as needed up to twice a day at this point.  Follow Up Instructions:    I discussed the assessment and treatment plan with the patient. The patient was provided an opportunity to ask questions and all were answered. The patient agreed with the plan and demonstrated an understanding of the instructions.   The patient was advised to call back or seek an in-person evaluation if the symptoms worsen or if the condition fails  to improve as anticipated.  I provided 30 minutes of non-face-to-face time during this encounter.   Molli Barrows, MD

## 2019-06-27 ENCOUNTER — Telehealth: Payer: Self-pay

## 2019-06-27 LAB — NOVEL CORONAVIRUS, NAA: SARS-CoV-2, NAA: DETECTED — AB

## 2019-06-27 NOTE — Telephone Encounter (Signed)
Patient called and she was informed that her test for COVID-19 was positive ans she can pass the germ to others. She denies symptoms.  Symptom tier and treatment plan for each was read to patient. Criteria for ending isolation was read to patient. Good preventative practices were reviewed. Patient questions were addressed with patient. Patient verbalized understanding of all information.  [place]will be notified.

## 2019-06-28 ENCOUNTER — Other Ambulatory Visit: Payer: Self-pay | Admitting: Infectious Diseases

## 2019-07-02 NOTE — Chronic Care Management (AMB) (Signed)
  Chronic Care Management   Note  07/02/2019 Name: Ellen [name] MRN: [id number] DOB: 25-Jun-1940  Ellen [name] is a 79 y.o. year old female who is a primary care patient of Volney American, [place]. I reached out to [name] by phone today in response to a referral sent by Ellen [name]'s health plan.     Ellen [name] was given information about Chronic Care Management services today including:  1. CCM service includes personalized support from designated clinical staff supervised by her physician, including individualized plan of care and coordination with other care providers 2. 24/7 contact phone numbers for assistance for urgent and routine care needs. 3. Service will only be billed when office clinical staff spend 20 minutes or more in a month to coordinate care. 4. Only one practitioner may furnish and bill the service in a calendar month. 5. The patient may stop CCM services at any time (effective at the end of the month) by phone call to the office staff. 6. The patient will be responsible for cost sharing (co-pay) of up to 20% of the service fee (after annual deductible is met).  Patient agreed to services and verbal consent obtained.   Follow up plan: Telephone appointment with care management team member scheduled for: 08/06/2019  [facility], [facility]  [place], [place] [address] Direct Dial: [email or web address]  Website: [email or web address]

## 2019-08-06 ENCOUNTER — Telehealth: Payer: Medicare Other

## 2019-08-06 ENCOUNTER — Ambulatory Visit: Payer: Self-pay | Admitting: General Practice

## 2019-08-06 NOTE — Chronic Care Management (AMB) (Signed)
  Chronic Care Management   Outreach Note  08/06/2019 Name: Ellen [name] MRN: [id number] DOB: 1941-04-08  Referred by: [name], PA-C Reason for referral : Chronic Care Management (Initial outreach: 1st attempt)   An unsuccessful telephone outreach was attempted today. The patient was referred to the case management team for assistance with care management and care coordination.   Follow Up Plan: The care management team will reach out to the patient again over the next 30 days.   Alto Denver RN, MSN, CCM Community Care Coordinator [place]  [facility] Mobile: [phone]

## 2019-08-26 ENCOUNTER — Telehealth: Payer: Self-pay | Admitting: Family Medicine

## 2019-08-26 MED ORDER — ATORVASTATIN CALCIUM 20 MG PO TABS: 20.0000 mg | ORAL_TABLET | Freq: Every day | ORAL | 1 refills | Status: DC

## 2019-08-26 NOTE — Telephone Encounter (Signed)
Routing to provider  

## 2019-08-26 NOTE — Telephone Encounter (Signed)
Rx sent 

## 2019-08-26 NOTE — Telephone Encounter (Signed)
Pt is here in office stating she needs a refill of atorvastatin medication,Pt would like RX to be sent to [facility].

## 2019-09-26 ENCOUNTER — Ambulatory Visit (INDEPENDENT_AMBULATORY_CARE_PROVIDER_SITE_OTHER): Payer: Medicare Other | Admitting: General Practice

## 2019-09-26 ENCOUNTER — Telehealth: Payer: Medicare Other | Admitting: General Practice

## 2019-09-26 DIAGNOSIS — I10 Essential (primary) hypertension: Secondary | ICD-10-CM

## 2019-09-26 DIAGNOSIS — E782 Mixed hyperlipidemia: Secondary | ICD-10-CM

## 2019-09-26 NOTE — Patient Instructions (Signed)
Visit Information  Goals Addressed            This Visit's Progress   . RNCM: Chronic Conditions: HTN/HLD       CARE PLAN ENTRY (see longtitudinal plan of care for additional care plan information)  Current Barriers:  . Chronic Disease Management support, education, and care coordination needs related to HTN and HLD  Clinical Goal(s) related to HTN and HLD:  Over the next 120 days, patient will:  . Work with the care management team to address educational, disease management, and care coordination needs  . Begin or continue self health monitoring activities as directed today Measure and record blood pressure 2 times per week and adhere to heart healthy diet . Call provider office for new or worsened signs and symptoms Blood pressure findings outside established parameters and New or worsened symptom related to HTN or HLD . Call care management team with questions or concerns . Verbalize basic understanding of patient centered plan of care established today  Interventions related to HTN and HLD:  . Evaluation of current treatment plans and patient's adherence to plan as established by provider . Evaluation of medications. Patient verbalized compliance with medication regimen.  . Assessed patient understanding of disease states . Assessed patient's education and care coordination needs . Provided disease specific education to patient  . Collaborated with appropriate clinical care team members regarding patient needs. Denies needs for CCM team pharmacist and LCSW but knows they are available to help as needed with concerns.   Patient Self Care Activities related to HTN and HLD:  . Patient is unable to independently self-manage chronic health conditions  Initial goal documentation        Ellen [name] was given information about Chronic Care Management services today including:  1. CCM service includes personalized support from designated clinical staff supervised by her  physician, including individualized plan of care and coordination with other care providers 2. 24/7 contact phone numbers for assistance for urgent and routine care needs. 3. Service will only be billed when office clinical staff spend 20 minutes or more in a month to coordinate care. 4. Only one practitioner may furnish and bill the service in a calendar month. 5. The patient may stop CCM services at any time (effective at the end of the month) by phone call to the office staff. 6. The patient will be responsible for cost sharing (co-pay) of up to 20% of the service fee (after annual deductible is met).  Patient agreed to services and verbal consent obtained.   Patient verbalizes understanding of instructions provided today.   The care management team will reach out to the patient again over the next 60 days.   Noreene Larsson RN, MSN, [facility] Mobile: [phone]

## 2019-09-26 NOTE — Chronic Care Management (AMB) (Signed)
Chronic Care Management   Initial Visit Note  09/26/2019 Name: Ellen [name] MRN: [id number] DOB: 12-May-1941  Referred by: Volney American, PA-C Reason for referral : Chronic Care Management (HTN/HLD- Initial 2nd call)   Ellen [name] is a 79 y.o. year old female who is a primary care patient of Volney American, [place]. The CCM team was consulted for assistance with chronic disease management and care coordination needs related to HTN and HLD  Review of patient status, including review of consultants reports, relevant laboratory and other test results, and collaboration with appropriate care team members and the patient's provider was performed as part of comprehensive patient evaluation and provision of chronic care management services.    SDOH (Social Determinants of Health) assessments performed: Yes See Care Plan activities for detailed interventions related to SDOH  SDOH Interventions     Most Recent Value  SDOH Interventions  SDOH Interventions for the Following Domains  Physical Activity  Physical Activity Interventions  Other (Comments) [the patient states she stays busy all of the time.]       Medications: Outpatient Encounter Medications as of 09/26/2019  Medication Sig   amLODipine (NORVASC) 5 MG tablet Take 1 tablet (5 mg total) by mouth daily.   aspirin 81 MG tablet Take 81 mg by mouth daily.   atorvastatin (LIPITOR) 20 MG tablet Take 1 tablet (20 mg total) by mouth daily.   calcium-vitamin D 250-100 MG-UNIT tablet Take 1 tablet by mouth 2 (two) times daily.   Cholecalciferol (VITAMIN D3) 1000 units CAPS Take 2 capsules by mouth daily.    lisinopril (ZESTRIL) 20 MG tablet Take 1 tablet (20 mg total) by mouth daily. To be taken alongside lisinopril HCTZ 20-92m tab   lisinopril-hydrochlorothiazide (ZESTORETIC) 20-25 MG tablet Take 1 tablet by mouth daily.   Magnesium 500 MG TABS Take 1 tablet by mouth daily.   vitamin C (ASCORBIC ACID) 500 MG  tablet Take 500 mg by mouth daily.   No facility-administered encounter medications on file as of 09/26/2019.     Objective:  BP Readings from Last 3 Encounters:  06/25/19 124/82  05/29/19 138/87  05/26/19 136/79    Goals Addressed            This Visit's Progress    RNCM: Chronic Conditions: HTN/HLD       CARE PLAN ENTRY (see longtitudinal plan of care for additional care plan information)  Current Barriers:   Chronic Disease Management support, education, and care coordination needs related to HTN and HLD  Clinical Goal(s) related to HTN and HLD:  Over the next 120 days, patient will:   Work with the care management team to address educational, disease management, and care coordination needs   Begin or continue self health monitoring activities as directed today Measure and record blood pressure 2 times per week and adhere to heart healthy diet  Call provider office for new or worsened signs and symptoms Blood pressure findings outside established parameters and New or worsened symptom related to HTN or HLD  Call care management team with questions or concerns  Verbalize basic understanding of patient centered plan of care established today  Interventions related to HTN and HLD:   Evaluation of current treatment plans and patient's adherence to plan as established by provider  Evaluation of medications. Patient verbalized compliance with medication regimen.   Assessed patient understanding of disease states  Assessed patient's education and care coordination needs  Provided disease specific education to patient  Collaborated with appropriate clinical care team members regarding patient needs. Denies needs for CCM team pharmacist and LCSW but knows they are available to help as needed with concerns.   Patient Self Care Activities related to HTN and HLD:   Patient is unable to independently self-manage chronic health conditions  Initial goal documentation           Ellen [name] was given information about Chronic Care Management services today including:  1. CCM service includes personalized support from designated clinical staff supervised by her physician, including individualized plan of care and coordination with other care providers 2. 24/7 contact phone numbers for assistance for urgent and routine care needs. 3. Service will only be billed when office clinical staff spend 20 minutes or more in a month to coordinate care. 4. Only one practitioner may furnish and bill the service in a calendar month. 5. The patient may stop CCM services at any time (effective at the end of the month) by phone call to the office staff. 6. The patient will be responsible for cost sharing (co-pay) of up to 20% of the service fee (after annual deductible is met).  Patient agreed to services and verbal consent obtained.   Plan:   The care management team will reach out to the patient again over the next 60 days.   Noreene Larsson RN, MSN, [facility] Mobile: [phone]

## 2019-10-06 ENCOUNTER — Encounter: Payer: Self-pay | Admitting: Anesthesiology

## 2019-10-06 ENCOUNTER — Ambulatory Visit: Payer: Medicare Other | Attending: Anesthesiology | Admitting: Anesthesiology

## 2019-10-06 ENCOUNTER — Other Ambulatory Visit: Payer: Self-pay

## 2019-10-06 DIAGNOSIS — M549 Dorsalgia, unspecified: Secondary | ICD-10-CM | POA: Diagnosis not present

## 2019-10-06 DIAGNOSIS — M4716 Other spondylosis with myelopathy, lumbar region: Secondary | ICD-10-CM

## 2019-10-06 DIAGNOSIS — M5136 Other intervertebral disc degeneration, lumbar region: Secondary | ICD-10-CM | POA: Diagnosis not present

## 2019-10-06 DIAGNOSIS — M5431 Sciatica, right side: Secondary | ICD-10-CM

## 2019-10-06 DIAGNOSIS — M791 Myalgia, unspecified site: Secondary | ICD-10-CM

## 2019-10-06 DIAGNOSIS — M48062 Spinal stenosis, lumbar region with neurogenic claudication: Secondary | ICD-10-CM | POA: Diagnosis not present

## 2019-10-06 MED ORDER — CYCLOBENZAPRINE HCL 10 MG PO TABS: 10.0000 mg | ORAL_TABLET | Freq: Every day | ORAL | 3 refills | Status: AC

## 2019-10-06 NOTE — Progress Notes (Signed)
Virtual Visit via Telephone Note  I connected with Karl Knarr Keegan on 10/06/19 at  2:00 PM EDT by telephone and verified that I am speaking with the correct person using two identifiers.  Location: Patient: Home Provider: Pain control center   I discussed the limitations, risks, security and privacy concerns of performing an evaluation and management service by telephone and the availability of in person appointments. I also discussed with the patient that there may be a patient responsible charge related to this service. The patient expressed understanding and agreed to proceed.   History of Present Illness: I spoke with [facility] via telephone today as she was unable to do the video portion of the virtual conference.  She reports that she is having some more right greater than left lower extremity pain.  She has had epidurals for this in the past and her most recent one was back in December.  She would like to schedule for a repeat epidural as she did very well with her last epidural giving her approximately 75 to 80% relief generally lasting about 2 months.  She has these periodically for her low back and leg pain.  She is otherwise using some Flexeril at the 5 mg strength and mentions that she would like to increase this to the 10 mg strength and she has recently run out of this.  She has done conservative therapy with physical therapy and stretching strengthening exercises with limited relief and generally relies on epidurals to be done periodically to keep her pain under good control.  Otherwise no change in lower extremity strength or function or bowel bladder function is noted at this time.    Observations/Objective:  Current Outpatient Medications:  .  amLODipine (NORVASC) 5 MG tablet, Take 1 tablet (5 mg total) by mouth daily., Disp: 90 tablet, Rfl: 1 .  aspirin 81 MG tablet, Take 81 mg by mouth daily., Disp: , Rfl:  .  atorvastatin (LIPITOR) 20 MG tablet, Take 1 tablet (20 mg total) by  mouth daily., Disp: 90 tablet, Rfl: 1 .  calcium-vitamin D 250-100 MG-UNIT tablet, Take 1 tablet by mouth 2 (two) times daily., Disp: , Rfl:  .  Cholecalciferol (VITAMIN D3) 1000 units CAPS, Take 2 capsules by mouth daily. , Disp: , Rfl:  .  cyclobenzaprine (FLEXERIL) 10 MG tablet, Take 1 tablet (10 mg total) by mouth at bedtime., Disp: 30 tablet, Rfl: 3 .  lisinopril (ZESTRIL) 20 MG tablet, Take 1 tablet (20 mg total) by mouth daily. To be taken alongside lisinopril HCTZ 20-25mg  tab, Disp: 90 tablet, Rfl: 1 .  lisinopril-hydrochlorothiazide (ZESTORETIC) 20-25 MG tablet, Take 1 tablet by mouth daily., Disp: 90 tablet, Rfl: 3 .  Magnesium 500 MG TABS, Take 1 tablet by mouth daily., Disp: , Rfl:  .  vitamin C (ASCORBIC ACID) 500 MG tablet, Take 500 mg by mouth daily., Disp: , Rfl:  Assessment and Plan: 1. Spinal stenosis of lumbar region with neurogenic claudication   2. Mid back pain on right side   3. Sciatica of right side   4. DDD (degenerative disc disease), lumbar   5. Spondylosis, lumbar, with myelopathy   6. Muscle soreness   Based on our discussion today I am going to schedule her for return to clinic in the next month for an epidural steroid injection.  She does well with these and has failed conservative therapy.  These generally keep her pain under good control she reports and keep her active and functional.  I am  also going to increase her Flexeril to the 10 mg tablet strength and informed her that she can cut these to the 5 mg strength if this is too much.  Otherwise I will schedule her for return to clinic as mentioned and I want her to continue with the stretching strengthening exercises and continue follow-up with her primary care physicians for baseline medical care.  Follow Up Instructions:    I discussed the assessment and treatment plan with the patient. The patient was provided an opportunity to ask questions and all were answered. The patient agreed with the plan and  demonstrated an understanding of the instructions.   The patient was advised to call back or seek an in-person evaluation if the symptoms worsen or if the condition fails to improve as anticipated.  I provided 25 minutes of non-face-to-face time during this encounter.   Molli Barrows, MD

## 2019-11-04 ENCOUNTER — Encounter: Payer: Self-pay | Admitting: Anesthesiology

## 2019-11-04 ENCOUNTER — Other Ambulatory Visit: Payer: Self-pay

## 2019-11-04 ENCOUNTER — Other Ambulatory Visit: Payer: Self-pay | Admitting: Anesthesiology

## 2019-11-04 ENCOUNTER — Ambulatory Visit (HOSPITAL_BASED_OUTPATIENT_CLINIC_OR_DEPARTMENT_OTHER): Payer: Medicare Other | Admitting: Anesthesiology

## 2019-11-04 ENCOUNTER — Ambulatory Visit
Admission: RE | Admit: 2019-11-04 | Discharge: 2019-11-04 | Disposition: A | Discharge status: Home / Self Care | Payer: Medicare Other | Source: Ambulatory Visit | Attending: Anesthesiology | Admitting: Anesthesiology

## 2019-11-04 VITALS — BP 141/89 | HR 90 | Temp 97.8°F | Resp 20 | Ht 62.0 in | Wt 163.0 lb

## 2019-11-04 DIAGNOSIS — M4716 Other spondylosis with myelopathy, lumbar region: Secondary | ICD-10-CM | POA: Diagnosis not present

## 2019-11-04 DIAGNOSIS — M549 Dorsalgia, unspecified: Secondary | ICD-10-CM | POA: Insufficient documentation

## 2019-11-04 DIAGNOSIS — M48062 Spinal stenosis, lumbar region with neurogenic claudication: Secondary | ICD-10-CM

## 2019-11-04 DIAGNOSIS — M5431 Sciatica, right side: Secondary | ICD-10-CM | POA: Diagnosis not present

## 2019-11-04 DIAGNOSIS — M5136 Other intervertebral disc degeneration, lumbar region: Secondary | ICD-10-CM

## 2019-11-04 DIAGNOSIS — R52 Pain, unspecified: Secondary | ICD-10-CM | POA: Diagnosis not present

## 2019-11-04 MED ORDER — TRIAMCINOLONE ACETONIDE 40 MG/ML IJ SUSP
40.0000 mg | Freq: Once | INTRAMUSCULAR | Status: AC
  Administered 2019-11-04: 40 mg
  Filled 2019-11-04: qty 1

## 2019-11-04 MED ORDER — ROPIVACAINE HCL 2 MG/ML IJ SOLN
10.0000 mL | Freq: Once | INTRAMUSCULAR | Status: AC
  Administered 2019-11-04: 10 mL via EPIDURAL
  Filled 2019-11-04: qty 10

## 2019-11-04 MED ORDER — LIDOCAINE HCL (PF) 1 % IJ SOLN
5.0000 mL | Freq: Once | INTRAMUSCULAR | Status: AC
  Administered 2019-11-04: 5 mL via SUBCUTANEOUS
  Filled 2019-11-04: qty 5

## 2019-11-04 MED ORDER — SODIUM CHLORIDE 0.9% FLUSH
10.0000 mL | Freq: Once | INTRAVENOUS | Status: AC
  Administered 2019-11-04: 10 mL

## 2019-11-04 MED ORDER — IOHEXOL 180 MG/ML  SOLN
10.0000 mL | Freq: Once | INTRAMUSCULAR | Status: AC | PRN
  Administered 2019-11-04: 10 mL via EPIDURAL

## 2019-11-04 NOTE — Patient Instructions (Signed)
Pain Management Discharge Instructions  General Discharge Instructions :  If you need to reach your doctor call: Monday-Friday 8:00 am - 4:00 pm at [phone] or toll free [phone].  After clinic hours [phone] to have operator reach doctor.  Bring all of your medication bottles to all your appointments in the pain clinic.  To cancel or reschedule your appointment with Pain Management please remember to call 24 hours in advance to avoid a fee.  Refer to the educational materials which you have been given on: General Risks, I had my Procedure. Discharge Instructions, Post Sedation.  Post Procedure Instructions:  The drugs you were given will stay in your system until tomorrow, so for the next 24 hours you should not drive, make any legal decisions or drink any alcoholic beverages.  You may eat anything you prefer, but it is better to start with liquids then soups and crackers, and gradually work up to solid foods.  Please notify your doctor immediately if you have any unusual bleeding, trouble breathing or pain that is not related to your normal pain.  Depending on the type of procedure that was done, some parts of your body may feel week and/or numb.  This usually clears up by tonight or the next day.  Walk with the use of an assistive device or accompanied by an adult for the 24 hours.  You may use ice on the affected area for the first 24 hours.  Put ice in a Ziploc bag and cover with a towel and place against area 15 minutes on 15 minutes off.  You may switch to heat after 24 hours.Epidural Steroid Injection Patient Information  Description: The epidural space surrounds the nerves as they exit the spinal cord.  In some patients, the nerves can be compressed and inflamed by a bulging disc or a tight spinal canal (spinal stenosis).  By injecting steroids into the epidural space, we can bring irritated nerves into direct contact with a potentially helpful medication.  These  steroids act directly on the irritated nerves and can reduce swelling and inflammation which often leads to decreased pain.  Epidural steroids may be injected anywhere along the spine and from the neck to the low back depending upon the location of your pain.   After numbing the skin with local anesthetic (like Novocaine), a small needle is passed into the epidural space slowly.  You may experience a sensation of pressure while this is being done.  The entire block usually last less than 10 minutes.  Conditions which may be treated by epidural steroids:   Low back and leg pain  Neck and arm pain  Spinal stenosis  Post-laminectomy syndrome  Herpes zoster (shingles) pain  Pain from compression fractures  Preparation for the injection:  1. Do not eat any solid food or dairy products within 8 hours of your appointment.  2. You may drink clear liquids up to 3 hours before appointment.  Clear liquids include water, black coffee, juice or soda.  No milk or cream please. 3. You may take your regular medication, including pain medications, with a sip of water before your appointment  Diabetics should hold regular insulin (if taken separately) and take 1/2 normal NPH dos the morning of the procedure.  Carry some sugar containing items with you to your appointment. 4. A driver must accompany you and be prepared to drive you home after your procedure.  5. Bring all your current medications with your. 6. An IV may be inserted and   sedation may be given at the discretion of the physician.   7. A blood pressure cuff, EKG and other monitors will often be applied during the procedure.  Some patients may need to have extra oxygen administered for a short period. 8. You will be asked to provide medical information, including your allergies, prior to the procedure.  We must know immediately if you are taking blood thinners (like Coumadin/Warfarin)  Or if you are allergic to IV iodine contrast (dye). We must  know if you could possible be pregnant.  Possible side-effects:  Bleeding from needle site  Infection (rare, may require surgery)  Nerve injury (rare)  Numbness & tingling (temporary)  Difficulty urinating (rare, temporary)  Spinal headache ( a headache worse with upright posture)  Light -headedness (temporary)  Pain at injection site (several days)  Decreased blood pressure (temporary)  Weakness in arm/leg (temporary)  Pressure sensation in back/neck (temporary)  Call if you experience:  Fever/chills associated with headache or increased back/neck pain.  Headache worsened by an upright position.  New onset weakness or numbness of an extremity below the injection site  Hives or difficulty breathing (go to the emergency room)  Inflammation or drainage at the infection site  Severe back/neck pain  Any new symptoms which are concerning to you  Please note:  Although the local anesthetic injected can often make your back or neck feel good for several hours after the injection, the pain will likely return.  It takes 3-7 days for steroids to work in the epidural space.  You may not notice any pain relief for at least that one week.  If effective, we will often do a series of three injections spaced 3-6 weeks apart to maximally decrease your pain.  After the initial series, we generally will wait several months before considering a repeat injection of the same type.  If you have any questions, please call [phone] [facility] 

## 2019-11-04 NOTE — Progress Notes (Signed)
Subjective:  Patient ID: Ellen [name], female    DOB: 1941-05-13  Age: 79 y.o. MRN: [id number]  CC: Leg Pain (right)   Procedure: L5-S1 epidural steroid and fluoroscopic guidance with no sedation  HPI Ellen [name] presents for reevaluation.  Ellen [name] has had a recurrence of her right posterior lateral leg pain radiating into the calf.  In the past she is had epidural steroids for this and they have worked favorably for her.  She generally gets 75 to 80% relief following the injection that lasted about 2 to 4 months.  She averages about 2 injections/year.  Her last injection was back in December 2020 and she went for about 4 months before she had gradual recurrence of the pain in the leg and low back and she reports for repeat epidural injection today.  No new changes in lower extremity strength or function or bowel or bladder function are noted at this time.  Outpatient Medications Prior to Visit  Medication Sig Dispense Refill  . amLODipine (NORVASC) 5 MG tablet Take 1 tablet (5 mg total) by mouth daily. 90 tablet 1  . aspirin 81 MG tablet Take 81 mg by mouth daily.    Marland Kitchen atorvastatin (LIPITOR) 20 MG tablet Take 1 tablet (20 mg total) by mouth daily. 90 tablet 1  . calcium-vitamin D 250-100 MG-UNIT tablet Take 1 tablet by mouth 2 (two) times daily.    . Cholecalciferol (VITAMIN D3) 1000 units CAPS Take 2 capsules by mouth daily.     . cyclobenzaprine (FLEXERIL) 10 MG tablet Take 1 tablet (10 mg total) by mouth at bedtime. 30 tablet 3  . cyclobenzaprine (FLEXERIL) 5 MG tablet Take 5 mg by mouth at bedtime.    Marland Kitchen lisinopril (ZESTRIL) 20 MG tablet Take 1 tablet (20 mg total) by mouth daily. To be taken alongside lisinopril HCTZ 20-25mg  tab 90 tablet 1  . lisinopril-hydrochlorothiazide (ZESTORETIC) 20-25 MG tablet Take 1 tablet by mouth daily. 90 tablet 3  . Magnesium 500 MG TABS Take 1 tablet by mouth daily.    . vitamin C (ASCORBIC ACID) 500 MG tablet Take 500 mg by mouth daily.     No  facility-administered medications prior to visit.    Review of Systems CNS: No confusion or sedation Cardiac: No angina or palpitations GI: No abdominal pain or constipation Constitutional: No nausea vomiting fevers or chills  Objective:  BP (!) 141/89   Pulse 90   Temp 97.8 F (36.6 C)   Resp 20   Ht 5\' 2"  (1.575 m)   Wt 163 lb (73.9 kg)   LMP 11/28/1994 (Approximate)   SpO2 98%   BMI 29.81 kg/m    BP Readings from Last 3 Encounters:  11/04/19 (!) 141/89  06/25/19 124/82  05/29/19 138/87     Wt Readings from Last 3 Encounters:  11/04/19 163 lb (73.9 kg)  06/25/19 161 lb (73 kg)  05/29/19 164 lb (74.4 kg)     Physical Exam Pt is alert and oriented PERRL EOMI HEART IS RRR no murmur or rub LCTA no wheezing or rales MUSCULOSKELETAL reveals a paraspinous muscle tenderness but no overt trigger points.  She does have a positive straight leg raise on the right side negative on the left.  She ambulates with a mildly antalgic gait.  Labs  Lab Results  Component Value Date   HGBA1C 6.5 (H) 05/21/2019   HGBA1C 6.0 (H) 11/08/2018   HGBA1C 6.4 (H) 04/19/2018   Lab Results  Component Value Date  MICROALBUR 30 (H) 10/02/2016   LDLCALC 101 (H) 05/21/2019   CREATININE 0.76 05/21/2019    -------------------------------------------------------------------------------------------------------------------- Lab Results  Component Value Date   WBC 5.7 05/21/2019   HGB 11.5 05/21/2019   HCT 35.3 05/21/2019   PLT 285 05/21/2019   GLUCOSE 108 (H) 05/21/2019   CHOL 176 05/21/2019   TRIG 123 05/21/2019   HDL 53 05/21/2019   LDLCALC 101 (H) 05/21/2019   ALT 18 05/21/2019   AST 25 05/21/2019   NA 142 05/21/2019   K 3.3 (L) 05/21/2019   CL 100 05/21/2019   CREATININE 0.76 05/21/2019   BUN 12 05/21/2019   CO2 26 05/21/2019   TSH 1.370 05/21/2019   HGBA1C 6.5 (H) 05/21/2019   MICROALBUR 30 (H) 10/02/2016     --------------------------------------------------------------------------------------------------------------------- DG PAIN CLINIC C-ARM 1-60 MIN NO REPORT  Result Date: 11/04/2019 Fluoro was used, but no Radiologist interpretation will be provided. Please refer to "NOTES" tab for provider progress note.    Assessment & Plan:   Ellen [name] was seen today for leg pain.  Diagnoses and all orders for this visit:  Spinal stenosis of lumbar region with neurogenic claudication -     Lumbar Epidural Injection; Future  Mid back pain on right side -     Lumbar Epidural Injection; Future  Sciatica of right side -     Lumbar Epidural Injection; Future  DDD (degenerative disc disease), lumbar -     Lumbar Epidural Injection; Future  Spondylosis, lumbar, with myelopathy  Other orders -     triamcinolone acetonide (KENALOG-40) injection 40 mg -     sodium chloride flush (NS) 0.9 % injection 10 mL -     ropivacaine (PF) 2 mg/mL (0.2%) (NAROPIN) injection 10 mL -     lidocaine (PF) (XYLOCAINE) 1 % injection 5 mL -     iohexol (OMNIPAQUE) 180 MG/ML injection 10 mL        ----------------------------------------------------------------------------------------------------------------------  Problem List Items Addressed This Visit    None    Visit Diagnoses    Spinal stenosis of lumbar region with neurogenic claudication    -  Primary   Relevant Medications   cyclobenzaprine (FLEXERIL) 5 MG tablet   triamcinolone acetonide (KENALOG-40) injection 40 mg (Completed)   ropivacaine (PF) 2 mg/mL (0.2%) (NAROPIN) injection 10 mL (Completed)   lidocaine (PF) (XYLOCAINE) 1 % injection 5 mL (Completed)   Other Relevant Orders   Lumbar Epidural Injection   Mid back pain on right side       Relevant Medications   cyclobenzaprine (FLEXERIL) 5 MG tablet   triamcinolone acetonide (KENALOG-40) injection 40 mg (Completed)   Other Relevant Orders   Lumbar Epidural Injection   Sciatica of right  side       Relevant Medications   cyclobenzaprine (FLEXERIL) 5 MG tablet   Other Relevant Orders   Lumbar Epidural Injection   DDD (degenerative disc disease), lumbar       Relevant Medications   cyclobenzaprine (FLEXERIL) 5 MG tablet   triamcinolone acetonide (KENALOG-40) injection 40 mg (Completed)   Other Relevant Orders   Lumbar Epidural Injection   Spondylosis, lumbar, with myelopathy       Relevant Medications   cyclobenzaprine (FLEXERIL) 5 MG tablet   triamcinolone acetonide (KENALOG-40) injection 40 mg (Completed)        ----------------------------------------------------------------------------------------------------------------------  1. Spinal stenosis of lumbar region with neurogenic claudication We will proceed with repeat epidural injection today.  Of gone over the risks and benefits with her in  full detail and all questions are answered.  I will schedule her for return to clinic in 2 months for repeat epidural at that time.  I want her to continue with stretching strengthening exercises as prescribed and continue with conservative mild analgesics for pain relief. - Lumbar Epidural Injection; Future  2. Mid back pain on right side As above - Lumbar Epidural Injection; Future  3. Sciatica of right side As above - Lumbar Epidural Injection; Future  4. DDD (degenerative disc disease), lumbar As above - Lumbar Epidural Injection; Future  5. Spondylosis, lumbar, with myelopathy Continue core strengthening    ----------------------------------------------------------------------------------------------------------------------  I am having Ellen [name] maintain her aspirin, Vitamin D3, Magnesium, calcium-vitamin D, vitamin C, lisinopril-hydrochlorothiazide, amLODipine, lisinopril, atorvastatin, cyclobenzaprine, and cyclobenzaprine. We administered triamcinolone acetonide, sodium chloride flush, ropivacaine (PF) 2 mg/mL (0.2%), lidocaine (PF), and  iohexol.   Meds ordered this encounter  Medications  . triamcinolone acetonide (KENALOG-40) injection 40 mg  . sodium chloride flush (NS) 0.9 % injection 10 mL  . ropivacaine (PF) 2 mg/mL (0.2%) (NAROPIN) injection 10 mL  . lidocaine (PF) (XYLOCAINE) 1 % injection 5 mL  . iohexol (OMNIPAQUE) 180 MG/ML injection 10 mL   Patient's Medications  New Prescriptions   No medications on file  Previous Medications   AMLODIPINE (NORVASC) 5 MG TABLET    Take 1 tablet (5 mg total) by mouth daily.   ASPIRIN 81 MG TABLET    Take 81 mg by mouth daily.   ATORVASTATIN (LIPITOR) 20 MG TABLET    Take 1 tablet (20 mg total) by mouth daily.   CALCIUM-VITAMIN D 250-100 MG-UNIT TABLET    Take 1 tablet by mouth 2 (two) times daily.   CHOLECALCIFEROL (VITAMIN D3) 1000 UNITS CAPS    Take 2 capsules by mouth daily.    CYCLOBENZAPRINE (FLEXERIL) 10 MG TABLET    Take 1 tablet (10 mg total) by mouth at bedtime.   CYCLOBENZAPRINE (FLEXERIL) 5 MG TABLET    Take 5 mg by mouth at bedtime.   LISINOPRIL (ZESTRIL) 20 MG TABLET    Take 1 tablet (20 mg total) by mouth daily. To be taken alongside lisinopril HCTZ 20-25mg  tab   LISINOPRIL-HYDROCHLOROTHIAZIDE (ZESTORETIC) 20-25 MG TABLET    Take 1 tablet by mouth daily.   MAGNESIUM 500 MG TABS    Take 1 tablet by mouth daily.   VITAMIN C (ASCORBIC ACID) 500 MG TABLET    Take 500 mg by mouth daily.  Modified Medications   No medications on file  Discontinued Medications   No medications on file   ----------------------------------------------------------------------------------------------------------------------  Follow-up: Return in about 2 months (around 01/04/2020) for evaluation, procedure.   Procedure: L5-S1 LESI with fluoroscopic guidance and no moderate sedation  NOTE: The risks, benefits, and expectations of the procedure have been discussed and explained to the patient who was understanding and in agreement with suggested treatment plan. No guarantees were  made.  DESCRIPTION OF PROCEDURE: Lumbar epidural steroid injection with no IV Versed, EKG, blood pressure, pulse, and pulse oximetry monitoring. The procedure was performed with the patient in the prone position under fluoroscopic guidance.  Sterile prep x3 was initiated and I then injected subcutaneous lidocaine to the overlying 5 S1 site after its fluoroscopic identifictation.  Using strict aseptic technique, I then advanced an 18-gauge Tuohy epidural needle in the midline using interlaminar approach via loss-of-resistance to saline technique. There was negative aspiration for heme or  CSF.  I then confirmed position with both AP and Lateral  fluoroscan.  2 cc of contrast dye were injected and a  total of 5 mL of Preservative-Free normal saline mixed with 40 mg of Kenalog and 1cc Ropicaine 0.2 percent were injected incrementally via the  epidurally placed needle. The needle was removed. The patient tolerated the injection well and was convalesced and discharged to home in stable condition. Should the patient have any post procedure difficulty they have been instructed on how to contact us for assistance.    Molli Barrows, MD

## 2019-11-05 ENCOUNTER — Telehealth: Payer: Self-pay

## 2019-11-05 NOTE — Telephone Encounter (Signed)
Post procedure phone call.  LM 

## 2019-11-19 ENCOUNTER — Encounter: Payer: Self-pay | Admitting: Family Medicine

## 2019-11-19 ENCOUNTER — Ambulatory Visit (INDEPENDENT_AMBULATORY_CARE_PROVIDER_SITE_OTHER): Payer: Medicare Other | Admitting: Family Medicine

## 2019-11-19 ENCOUNTER — Other Ambulatory Visit: Payer: Self-pay

## 2019-11-19 VITALS — BP 130/68 | HR 105 | Temp 98.0°F | Wt 165.8 lb

## 2019-11-19 DIAGNOSIS — R7301 Impaired fasting glucose: Secondary | ICD-10-CM

## 2019-11-19 DIAGNOSIS — M545 Low back pain, unspecified: Secondary | ICD-10-CM

## 2019-11-19 DIAGNOSIS — G8929 Other chronic pain: Secondary | ICD-10-CM

## 2019-11-19 DIAGNOSIS — E782 Mixed hyperlipidemia: Secondary | ICD-10-CM | POA: Diagnosis not present

## 2019-11-19 DIAGNOSIS — I700 Atherosclerosis of aorta: Secondary | ICD-10-CM

## 2019-11-19 DIAGNOSIS — I10 Essential (primary) hypertension: Secondary | ICD-10-CM

## 2019-11-19 MED ORDER — LISINOPRIL-HYDROCHLOROTHIAZIDE 20-25 MG PO TABS: 1.0000 | ORAL_TABLET | Freq: Every day | ORAL | 1 refills | Status: DC

## 2019-11-19 MED ORDER — LISINOPRIL 20 MG PO TABS: 20.0000 mg | ORAL_TABLET | Freq: Every day | ORAL | 1 refills | Status: DC

## 2019-11-19 MED ORDER — ATORVASTATIN CALCIUM 20 MG PO TABS: 20.0000 mg | ORAL_TABLET | Freq: Every day | ORAL | 1 refills | Status: DC

## 2019-11-19 MED ORDER — AMLODIPINE BESYLATE 5 MG PO TABS: 5.0000 mg | ORAL_TABLET | Freq: Every day | ORAL | 1 refills | Status: DC

## 2019-11-19 NOTE — Assessment & Plan Note (Signed)
Recheck A1C, adjust as needed. Continue control with good diet and exercise

## 2019-11-19 NOTE — Progress Notes (Signed)
BP 130/68   Pulse (!) 105   Temp 98 F (36.7 C) (Oral)   Wt 165 lb 12.8 oz (75.2 kg)   LMP 11/28/1994 (Approximate)   SpO2 97%   BMI 30.33 kg/m    Subjective:    Patient ID: Ellen [name], female    DOB: 10-Sep-1940, 79 y.o.   MRN: [id number]  HPI: Ellen [name] is a 79 y.o. female  Chief Complaint  Patient presents with  . Hyperlipidemia  . Hypertension   Presenting today for 6 month f/u chronic conditions.   Went to see Pain Mgmt for her chronic back pain and muscle spasms, benefiting significantly from injections and flexeril prn. Feeling much better overall.   HTN - not checking home BPs. Taking medications faithfully without side effects. Denies CP, SOB, HAs, dizziness. Trying to eat well and stay active.   HLD, aortic atherosclerosis - on lipitor and aspirin, tolerating well wtihout myalgias, claudication.   IFG - diet controlled, no polyuria, polydipsia, polyphagia. Trying to watch diet.   Relevant past medical, surgical, family and social history reviewed and updated as indicated. Interim medical history since our last visit reviewed. Allergies and medications reviewed and updated.  Review of Systems  Per HPI unless specifically indicated above     Objective:    BP 130/68   Pulse (!) 105   Temp 98 F (36.7 C) (Oral)   Wt 165 lb 12.8 oz (75.2 kg)   LMP 11/28/1994 (Approximate)   SpO2 97%   BMI 30.33 kg/m   Wt Readings from Last 3 Encounters:  11/19/19 165 lb 12.8 oz (75.2 kg)  11/04/19 163 lb (73.9 kg)  06/25/19 161 lb (73 kg)    Physical Exam Vitals and nursing note reviewed.  Constitutional:      Appearance: Normal appearance. She is not ill-appearing.  HENT:     Head: Atraumatic.  Eyes:     Extraocular Movements: Extraocular movements intact.     Conjunctiva/sclera: Conjunctivae normal.  Cardiovascular:     Rate and Rhythm: Normal rate and regular rhythm.     Heart sounds: Normal heart sounds.  Pulmonary:     Effort: Pulmonary  effort is normal.     Breath sounds: Normal breath sounds.  Musculoskeletal:        General: Normal range of motion.     Cervical back: Normal range of motion and neck supple.  Skin:    General: Skin is warm and dry.  Neurological:     Mental Status: She is alert and oriented to person, place, and time.  Psychiatric:        Mood and Affect: Mood normal.        Thought Content: Thought content normal.        Judgment: Judgment normal.     Results for orders placed or performed in visit on 06/24/19  Novel Coronavirus, NAA ([facility])   Specimen: Nasopharyngeal(NP) swabs in vial transport medium   NASOPHARYNGE  TESTING  Result Value Ref Range   SARS-CoV-2, NAA Detected (A) Not Detected      Assessment & Plan:   Problem List Items Addressed This Visit      Cardiovascular and Mediastinum   Hypertension    BPs stable and under good control, continue current regimen      Relevant Medications   lisinopril-hydrochlorothiazide (ZESTORETIC) 20-25 MG tablet   lisinopril (ZESTRIL) 20 MG tablet   atorvastatin (LIPITOR) 20 MG tablet   amLODipine (NORVASC) 5 MG tablet   Other  Relevant Orders   Comprehensive metabolic panel   Atherosclerosis of aorta (HCC) - Primary    Recheck lipids, adjust as needed. Continue good lifestyle modifications      Relevant Medications   lisinopril-hydrochlorothiazide (ZESTORETIC) 20-25 MG tablet   lisinopril (ZESTRIL) 20 MG tablet   atorvastatin (LIPITOR) 20 MG tablet   amLODipine (NORVASC) 5 MG tablet   Other Relevant Orders   Lipid Panel w/o Chol/HDL Ratio     Endocrine   IFG (impaired fasting glucose)    Recheck A1C, adjust as needed. Continue control with good diet and exercise      Relevant Orders   HgB A1c     Other   Back pain    Significant improvement with injections and flexeril prn, continue with Pain Mgmt      Relevant Medications   cyclobenzaprine (FLEXERIL) 10 MG tablet   Hyperlipidemia    Recheck lipids, adjust as needed       Relevant Medications   lisinopril-hydrochlorothiazide (ZESTORETIC) 20-25 MG tablet   lisinopril (ZESTRIL) 20 MG tablet   atorvastatin (LIPITOR) 20 MG tablet   amLODipine (NORVASC) 5 MG tablet   Other Relevant Orders   Lipid Panel w/o Chol/HDL Ratio       Follow up plan: Return in about 6 months (around 05/20/2020) for CPE.

## 2019-11-19 NOTE — Assessment & Plan Note (Signed)
Recheck lipids, adjust as needed 

## 2019-11-19 NOTE — Assessment & Plan Note (Signed)
Significant improvement with injections and flexeril prn, continue with Pain Mgmt

## 2019-11-19 NOTE — Assessment & Plan Note (Signed)
Recheck lipids, adjust as needed. Continue good lifestyle modifications 

## 2019-11-19 NOTE — Assessment & Plan Note (Signed)
BPs stable and under good control, continue current regimen 

## 2019-11-20 ENCOUNTER — Encounter: Payer: Self-pay | Admitting: Family Medicine

## 2019-11-20 LAB — COMPREHENSIVE METABOLIC PANEL
ALT: 17 IU/L (ref 0–32)
AST: 21 IU/L (ref 0–40)
Albumin/Globulin Ratio: 1.5 (ref 1.2–2.2)
Albumin: 4.1 g/dL (ref 3.7–4.7)
Alkaline Phosphatase: 90 IU/L (ref 48–121)
BUN/Creatinine Ratio: 22 (ref 12–28)
BUN: 19 mg/dL (ref 8–27)
Bilirubin Total: <0.2 mg/dL (ref 0.0–1.2)
CO2: 24 mmol/L (ref 20–29)
Calcium: 9.6 mg/dL (ref 8.7–10.3)
Chloride: 103 mmol/L (ref 96–106)
Creatinine, Ser: 0.85 mg/dL (ref 0.57–1.00)
GFR calc Af Amer: 77 mL/min/{1.73_m2} (ref >59)
GFR calc non Af Amer: 67 mL/min/{1.73_m2} (ref >59)
Globulin, Total: 2.8 g/dL (ref 1.5–4.5)
Glucose: 164 mg/dL — ABNORMAL HIGH (ref 65–99)
Potassium: 3.6 mmol/L (ref 3.5–5.2)
Sodium: 141 mmol/L (ref 134–144)
Total Protein: 6.9 g/dL (ref 6.0–8.5)

## 2019-11-20 LAB — LIPID PANEL W/O CHOL/HDL RATIO
Cholesterol, Total: 157 mg/dL (ref 100–199)
HDL: 49 mg/dL (ref >39)
LDL Chol Calc (NIH): 73 mg/dL (ref 0–99)
Triglycerides: 212 mg/dL — ABNORMAL HIGH (ref 0–149)
VLDL Cholesterol Cal: 35 mg/dL (ref 5–40)

## 2019-11-20 LAB — HEMOGLOBIN A1C
Est. average glucose Bld gHb Est-mCnc: 140 mg/dL
Hgb A1c MFr Bld: 6.5 % — ABNORMAL HIGH (ref 4.8–5.6)

## 2019-12-03 ENCOUNTER — Ambulatory Visit (INDEPENDENT_AMBULATORY_CARE_PROVIDER_SITE_OTHER): Payer: Medicare Other | Admitting: Nurse Practitioner

## 2019-12-03 ENCOUNTER — Other Ambulatory Visit: Payer: Self-pay

## 2019-12-03 ENCOUNTER — Encounter: Payer: Self-pay | Admitting: Nurse Practitioner

## 2019-12-03 ENCOUNTER — Telehealth: Payer: Self-pay | Admitting: Anesthesiology

## 2019-12-03 DIAGNOSIS — M542 Cervicalgia: Secondary | ICD-10-CM

## 2019-12-03 MED ORDER — LIDOCAINE 5 % EX PTCH: 1.0000 | MEDICATED_PATCH | CUTANEOUS | 0 refills | Status: DC

## 2019-12-03 MED ORDER — PREDNISONE 10 MG PO TABS: ORAL_TABLET | ORAL | 0 refills | Status: DC

## 2019-12-03 MED ORDER — TRIAMCINOLONE ACETONIDE 40 MG/ML IJ SUSP
40.0000 mg | Freq: Once | INTRAMUSCULAR | Status: AC
  Administered 2019-12-03: 40 mg via INTRAMUSCULAR

## 2019-12-03 NOTE — Assessment & Plan Note (Signed)
Acute x 3-4 days extending into shoulders.  No red flag symptoms on exam.  Is followed by pain clinic and aware no opioids will be provided in office today.  Kenalog IM in office today.  Script for Lidocaine patches and Prednisone taper sent in.  Recommend she take Tylenol at home as needed + alternate heat and ice at home.  Recommend she reach out to pain clinic today to notify them of current pain status, see if can obtain sooner appointment for follow-up.  Consider imaging if worsening or ongoing, suspect musculoskeletal at this time.  Return to office for worsening or ongoing symptoms.

## 2019-12-03 NOTE — Progress Notes (Signed)
BP 134/78 (BP Location: Left Arm)   Pulse 86 Comment: apical  Temp 98.1 F (36.7 C) (Oral)   Wt 167 lb (75.8 kg)   LMP 11/28/1994 (Approximate)   SpO2 98%   BMI 30.54 kg/m    Subjective:    Patient ID: Ellen [name], female    DOB: July 08, 1940, 79 y.o.   MRN: [id number]  HPI: Ellen [name] is a 79 y.o. female  Chief Complaint  Patient presents with  . Neck Pain  . Shoulder Pain   NECK & SHOULDER PAIN  Present for 3-4 days, woke up with it.  Reports this is all over neck and both shoulders.  Reports some pressure and discomfort in bilateral ears too.  At baseline has chronic lower back pain.   Attends pain management clinic and last saw Dr. Pernell Dupre on 11/04/2019 for steroid injection L5-S1.  She reports that he told her to call if any worsening pain and she had thought about calling him, is scheduled to follow-up with him next month. Status: uncontrolled Relief with NSAIDs?:  no Location: all over Duration:days Severity: 10/10 Quality: sharp and aching Frequency: constant Radiation: none Aggravating factors: lifting and movement Alleviating factors: heat and NSAIDs Weakness:  no Paresthesias / decreased sensation:  no  Fevers:  no   Relevant past medical, surgical, family and social history reviewed and updated as indicated. Interim medical history since our last visit reviewed. Allergies and medications reviewed and updated.  Review of Systems  Constitutional: Negative for activity change, appetite change, diaphoresis, fatigue and fever.  Respiratory: Negative for cough, chest tightness and shortness of breath.   Cardiovascular: Negative for chest pain, palpitations and leg swelling.  Gastrointestinal: Negative.   Musculoskeletal: Positive for neck pain.  Neurological: Negative.   Psychiatric/Behavioral: Negative.     Per HPI unless specifically indicated above     Objective:    BP 134/78 (BP Location: Left Arm)   Pulse 86 Comment: apical  Temp 98.1  F (36.7 C) (Oral)   Wt 167 lb (75.8 kg)   LMP 11/28/1994 (Approximate)   SpO2 98%   BMI 30.54 kg/m   Wt Readings from Last 3 Encounters:  12/03/19 167 lb (75.8 kg)  11/19/19 165 lb 12.8 oz (75.2 kg)  11/04/19 163 lb (73.9 kg)    Physical Exam Vitals and nursing note reviewed.  Constitutional:      General: She is awake. She is not in acute distress.    Appearance: She is well-developed and well-groomed. She is not ill-appearing.  HENT:     Head: Normocephalic.     Right Ear: Hearing normal.     Left Ear: Hearing normal.  Eyes:     General: Lids are normal.        Right eye: No discharge.        Left eye: No discharge.     Conjunctiva/sclera: Conjunctivae normal.     Pupils: Pupils are equal, round, and reactive to light.  Neck:     Thyroid: No thyromegaly.     Vascular: No carotid bruit.  Cardiovascular:     Rate and Rhythm: Normal rate and regular rhythm.     Heart sounds: Normal heart sounds. No murmur heard.  No gallop.   Pulmonary:     Effort: Pulmonary effort is normal. No accessory muscle usage or respiratory distress.     Breath sounds: Normal breath sounds.  Abdominal:     General: Bowel sounds are normal.     Palpations: Abdomen  is soft.  Musculoskeletal:     Cervical back: Normal range of motion and neck supple.     Right lower leg: No edema.     Left lower leg: No edema.  Lymphadenopathy:     Cervical: No cervical adenopathy.  Skin:    General: Skin is warm and dry.  Neurological:     Mental Status: She is alert and oriented to person, place, and time.     Deep Tendon Reflexes: Reflexes are normal and symmetric.     Reflex Scores:      Brachioradialis reflexes are 2+ on the right side and 2+ on the left side.      Patellar reflexes are 2+ on the right side and 2+ on the left side. Psychiatric:        Attention and Perception: Attention normal.        Mood and Affect: Mood normal.        Speech: Speech normal.        Behavior: Behavior normal.  Behavior is cooperative.        Thought Content: Thought content normal.    Neck Exam:    Tenderness to Palpation: yes    Midline cervical spine: no    Paraspinal neck musculature: yes    Trapezius: no    Sternocleidomastoid: no     Range of Motion:     Flexion: Decreased    Extension: Decreased    Lateral rotation: Decreased    Lateral bending: Decreased     Neuro Examination: Upper extremity DTRs normal & symmetric.  Strength and sensation intact.    C5   Reflex ( Biceps): Normal            Muscle (Deltoid and biceps): Within Normal Limits            Sensation (Lateral arm): Within Normal Limits       C6   Reflex (Brachioradialis): Normal            Muscle (Wrist extension and biceps): Within Normal Limits            Sensation (Lateral forearm):Within Normal Limits     C7   Reflex (Triceps): Normal            Muscle ( Wrist flexors, finger extension, triceps): Within Normal Limits            Sensation (Middle finger): Within Normal Limits      C8   Muscle (Finger extension, hand intrinsics): Within Normal Limits            Sensation (Medial forearm):Within Normal Limits       T1   Muscle (Hand intrinsics):Within Normal Limits            Sensation (Medial arm):Within Normal Limits     Special Tests:     Spurling test: negative  Results for orders placed or performed in visit on 11/19/19  Comprehensive metabolic panel  Result Value Ref Range   Glucose 164 (H) 65 - 99 mg/dL   BUN 19 8 - 27 mg/dL   Creatinine, Ser 8.75 0.57 - 1.00 mg/dL   GFR calc non Af Amer 67 >59 mL/min/1.73   GFR calc Af Amer 77 >59 mL/min/1.73   BUN/Creatinine Ratio 22 12 - 28   Sodium 141 134 - 144 mmol/L   Potassium 3.6 3.5 - 5.2 mmol/L   Chloride 103 96 - 106 mmol/L   CO2 24 20 - 29 mmol/L   Calcium 9.6 8.7 -  10.3 mg/dL   Total Protein 6.9 6.0 - 8.5 g/dL   Albumin 4.1 3.7 - 4.7 g/dL   Globulin, Total 2.8 1.5 - 4.5 g/dL   Albumin/Globulin Ratio 1.5 1.2 - 2.2   Bilirubin Total <0.2 0.0 -  1.2 mg/dL   Alkaline Phosphatase 90 48 - 121 IU/L   AST 21 0 - 40 IU/L   ALT 17 0 - 32 IU/L  Lipid Panel w/o Chol/HDL Ratio  Result Value Ref Range   Cholesterol, Total 157 100 - 199 mg/dL   Triglycerides 212 (H) 0 - 149 mg/dL   HDL 49 >39 mg/dL   VLDL Cholesterol Cal 35 5 - 40 mg/dL   LDL Chol Calc (NIH) 73 0 - 99 mg/dL  HgB A1c  Result Value Ref Range   Hgb A1c MFr Bld 6.5 (H) 4.8 - 5.6 %   Est. average glucose Bld gHb Est-mCnc 140 mg/dL      Assessment & Plan:   Problem List Items Addressed This Visit      Other   Neck pain    Acute x 3-4 days extending into shoulders.  No red flag symptoms on exam.  Is followed by pain clinic and aware no opioids will be provided in office today.  Kenalog IM in office today.  Script for Lidocaine patches and Prednisone taper sent in.  Recommend she take Tylenol at home as needed + alternate heat and ice at home.  Recommend she reach out to pain clinic today to notify them of current pain status, see if can obtain sooner appointment for follow-up.  Consider imaging if worsening or ongoing, suspect musculoskeletal at this time.  Return to office for worsening or ongoing symptoms.      Relevant Medications   triamcinolone acetonide (KENALOG-40) injection 40 mg (Start on 12/03/2019  9:15 AM)       Follow up plan: Return if symptoms worsen or fail to improve.

## 2019-12-03 NOTE — Telephone Encounter (Signed)
Lidocaine patch 5% , predinisone 10mg , and received a shot for pain in shoulder and her neck, couldn't turn her head. Wanted to let Dr. know what was going on. Went to PCP office

## 2019-12-03 NOTE — Telephone Encounter (Signed)
Is this just an FYI? If not, schedule appt please.

## 2019-12-03 NOTE — Patient Instructions (Signed)
Musculoskeletal Pain Musculoskeletal pain refers to aches and pains in your bones, joints, muscles, and the tissues that surround them. This pain can occur in any part of the body. It can last for a short time (acute) or a long time (chronic). A physical exam, lab tests, and imaging studies may be done to find the cause of your musculoskeletal pain. Follow these instructions at home:  Lifestyle  Try to control or lower your stress levels. Stress increases muscle tension and can worsen musculoskeletal pain. It is important to recognize when you are anxious or stressed and learn ways to manage it. This may include: ? Meditation or yoga. ? Cognitive or behavioral therapy. ? Acupuncture or massage therapy.  You may continue all activities unless the activities cause more pain. When the pain gets better, slowly resume your normal activities. Gradually increase the intensity and duration of your activities or exercise. Managing pain, stiffness, and swelling  Take over-the-counter and prescription medicines only as told by your health care provider.  When your pain is severe, bed rest may be helpful. Lie or sit in any position that is comfortable, but get out of bed and walk around at least every couple of hours.  If directed, apply heat to the affected area as often as told by your health care provider. Use the heat source that your health care provider recommends, such as a moist heat pack or a heating pad. ? Place a towel between your skin and the heat source. ? Leave the heat on for 20-30 minutes. ? Remove the heat if your skin turns bright red. This is especially important if you are unable to feel pain, heat, or cold. You may have a greater risk of getting burned.  If directed, put ice on the painful area. ? Put ice in a plastic bag. ? Place a towel between your skin and the bag. ? Leave the ice on for 20 minutes, 2-3 times a day. General instructions  Your health care provider may  recommend that you see a physical therapist. This person can help you come up with a safe exercise program. Do any exercises as told by your physical therapist.  Keep all follow-up visits, including any physical therapy visits, as told by your health care providers. This is important. Contact a health care provider if:  Your pain gets worse.  Medicines do not help ease your pain.  You cannot use the part of your body that hurts, such as your arm, leg, or neck.  You have trouble sleeping.  You have trouble doing your normal activities. Get help right away if:  You have a new injury and your pain is worse or different.  You feel numb or you have tingling in the painful area. Summary  Musculoskeletal pain refers to aches and pains in your bones, joints, muscles, and the tissues that surround them.  This pain can occur in any part of the body.  Your health care provider may recommend that you see a physical therapist. This person can help you come up with a safe exercise program. Do any exercises as told by your physical therapist.  Lower your stress level. Stress can worsen musculoskeletal pain. Ways to lower stress may include meditation, yoga, cognitive or behavioral therapy, acupuncture, and massage therapy. This information is not intended to replace advice given to you by your health care provider. Make sure you discuss any questions you have with your health care provider. Document Revised: 05/18/2017 Document Reviewed: 07/05/2016 Elsevier Patient   Education  2020 [employer]  

## 2019-12-05 ENCOUNTER — Ambulatory Visit (INDEPENDENT_AMBULATORY_CARE_PROVIDER_SITE_OTHER): Payer: Medicare Other | Admitting: General Practice

## 2019-12-05 ENCOUNTER — Telehealth: Payer: Self-pay | Admitting: General Practice

## 2019-12-05 DIAGNOSIS — E782 Mixed hyperlipidemia: Secondary | ICD-10-CM | POA: Diagnosis not present

## 2019-12-05 DIAGNOSIS — I10 Essential (primary) hypertension: Secondary | ICD-10-CM

## 2019-12-05 DIAGNOSIS — M542 Cervicalgia: Secondary | ICD-10-CM

## 2019-12-05 NOTE — Chronic Care Management (AMB) (Signed)
Chronic Care Management   Follow Up Note   12/05/2019 Name: Ellen [name] MRN: [id number] DOB: [birth year]  Referred by: [name], PA-C Reason for referral : Chronic Care Management (Follow up: RNCM Chronic Disease Management and Care Coordination Needs)   Ellen [name] is a 79 y.o. year old female who is a primary care patient of [name], [place]. The CCM team was consulted for assistance with chronic disease management and care coordination needs.    Review of patient status, including review of consultants reports, relevant laboratory and other test results, and collaboration with appropriate care team members and the patient's provider was performed as part of comprehensive patient evaluation and provision of chronic care management services.    SDOH (Social Determinants of Health) assessments performed: Yes See Care Plan activities for detailed interventions related to [facility])     Outpatient Encounter Medications as of 12/05/2019  Medication Sig  . amLODipine (NORVASC) 5 MG tablet Take 1 tablet (5 mg total) by mouth daily.  Marland Kitchen aspirin 81 MG tablet Take 81 mg by mouth daily.  Marland Kitchen atorvastatin (LIPITOR) 20 MG tablet Take 1 tablet (20 mg total) by mouth daily.  Marland Kitchen CALCIUM-MAGNESIUM-ZINC PO Take 1 tablet by mouth daily.  . calcium-vitamin D 250-100 MG-UNIT tablet Take 1 tablet by mouth 2 (two) times daily.  . Cholecalciferol (VITAMIN D3) 1000 units CAPS Take 2 capsules by mouth daily.   . cyclobenzaprine (FLEXERIL) 10 MG tablet Take 10 mg by mouth at bedtime.  . lidocaine (LIDODERM) 5 % Place 1 patch onto the skin daily. Remove & Discard patch within 12 hours or as directed by MD  . lisinopril (ZESTRIL) 20 MG tablet Take 1 tablet (20 mg total) by mouth daily. To be taken alongside lisinopril HCTZ 20-25mg  tab  . lisinopril-hydrochlorothiazide (ZESTORETIC) 20-25 MG tablet Take 1 tablet by mouth daily.  . Magnesium 500 MG TABS Take 1 tablet by mouth daily.  .  predniSONE (DELTASONE) 10 MG tablet Take 6 tablets by mouth daily for 2 days, then reduce by 1 tablet every 2 days until gone  . vitamin C (ASCORBIC ACID) 500 MG tablet Take 500 mg by mouth daily.   No facility-administered encounter medications on file as of 12/05/2019.     Objective:  BP Readings from Last 3 Encounters:  12/03/19 134/78  11/19/19 130/68  11/04/19 (!) 141/89    Goals Addressed              This Visit's Progress   .  RNCM: Chronic Conditions: HTN/HLD        CARE PLAN ENTRY (see longtitudinal plan of care for additional care plan information)  Current Barriers:  . Chronic Disease Management support, education, and care coordination needs related to HTN and HLD  Clinical Goal(s) related to HTN and HLD:  Over the next 120 days, patient will:  . Work with the care management team to address educational, disease management, and care coordination needs  . Begin or continue self health monitoring activities as directed today Measure and record blood pressure 2 times per week and adhere to heart healthy diet . Call provider office for new or worsened signs and symptoms Blood pressure findings outside established parameters and New or worsened symptom related to HTN or HLD . Call care management team with questions or concerns . Verbalize basic understanding of patient centered plan of care established today  Interventions related to HTN and HLD:  . Evaluation of current treatment plans and patient's adherence  to plan as established by provider.  The patient endorses compliance with medications and treatment regimen.  . Evaluation of medications. Patient verbalized compliance with medication regimen.  . Assessed patient understanding of disease states.  The patient verbalized understanding of her chronic conditions.  . Assessed patient's education and care coordination needs.  The patient verbalized that her daughter is a Engineer, civil (consulting) and she comes and checks her blood  pressures. Encouraged the patient to write down her blood pressure readings and have them on hand so she could let the [facility] know on outreach calls. The patient will start doing this.  . Provided disease specific education to patient.  Education on heart healthy diet and sodium restrictions. The patient is enjoying fresh fruits and vegetables.  The patient denies any issues with dietary restrictions.  . Evaluation of activity level. The patient verbalized that she is enjoying getting out and being outside due to the prettier weather.  Steele Sizer with appropriate clinical care team members regarding patient needs. Denies needs for CCM team pharmacist and LCSW but knows they are available to help as needed with concerns.   Patient Self Care Activities related to HTN and HLD:  . Patient is unable to independently self-manage chronic health conditions  Please see past updates related to this goal by clicking on the "Past Updates" button in the selected goal      .  RNCM: Pt-"I am having pain in my neck" (pt-stated)        CARE PLAN ENTRY (see longitudinal plan of care for additional care plan information)  Current Barriers:  Marland Kitchen Knowledge Deficits related to new onset of neck pain and discomfort, etiology unknown . Care Coordination needs related to pain management  in a patient with acute on chronic neck pain (disease states) . Chronic Disease Management support and education needs related to pain management of neck pain  Nurse Case Manager Clinical Goal(s):  Marland Kitchen Over the next 120 days, patient will verbalize understanding of plan for management of pain in neck . Over the next 120 days, patient will work with RNCM, pcp and pain clinic to address needs related to new onset of pain in neck . Over the next 120 days, patient will demonstrate a decrease in pain exacerbations as evidenced by decreased pain level or resolution of pain in neck . Over the next 120 days, patient will attend all scheduled  medical appointments: Has appointment with pain clinic on 01-05-2020 but will call to see if she can get a sooner appointment   Interventions:  . Inter-disciplinary care team collaboration (see longitudinal plan of care) . Evaluation of current treatment plan related to pain in neck  and patient's adherence to plan as established by provider. . Advised patient to call the provider for worsening s/s of pain or changes in the level or intensity of pain . Provided education to patient re: alternative pain management methods such as the use of heat and cold application. The patient has been using heat application and her husband has been massaging her shoulders and neck. The patient states that she is using the lidocaine patches and this is helping. Her neck is still sore but it is better that what it was.  Marland Kitchen Collaborated with pcp and pain clinic  regarding the patients neck pain. The patient has an appointment on 01-05-2020 but will try and get a sooner appointment.  . Discussed plans with patient for ongoing care management follow up and provided patient with direct contact information for  care management team . Reviewed scheduled/upcoming provider appointments including: sees the pain clinic on 01-05-2020, has a follow up with pcp on 12-25-2019  Patient Self Care Activities:  . Patient verbalizes understanding of plan to manage neck pain effectively . Self administers medications as prescribed . Attends all scheduled provider appointments . Calls provider office for new concerns or questions . Unable to independently manage acute on chronic neck pain   Initial goal documentation         Plan:   The care management team will reach out to the patient again over the next 30 to 60 days.     Noreene Larsson RN, MSN, [facility] Mobile: [phone]

## 2019-12-05 NOTE — Patient Instructions (Signed)
Visit Information  Goals Addressed              This Visit's Progress   .  RNCM: Chronic Conditions: HTN/HLD        CARE PLAN ENTRY (see longtitudinal plan of care for additional care plan information)  Current Barriers:  . Chronic Disease Management support, education, and care coordination needs related to HTN and HLD  Clinical Goal(s) related to HTN and HLD:  Over the next 120 days, patient will:  . Work with the care management team to address educational, disease management, and care coordination needs  . Begin or continue self health monitoring activities as directed today Measure and record blood pressure 2 times per week and adhere to heart healthy diet . Call provider office for new or worsened signs and symptoms Blood pressure findings outside established parameters and New or worsened symptom related to HTN or HLD . Call care management team with questions or concerns . Verbalize basic understanding of patient centered plan of care established today  Interventions related to HTN and HLD:  . Evaluation of current treatment plans and patient's adherence to plan as established by provider.  The patient endorses compliance with medications and treatment regimen.  . Evaluation of medications. Patient verbalized compliance with medication regimen.  . Assessed patient understanding of disease states.  The patient verbalized understanding of her chronic conditions.  . Assessed patient's education and care coordination needs.  The patient verbalized that her daughter is a Engineer, civil (consulting) and she comes and checks her blood pressures. Encouraged the patient to write down her blood pressure readings and have them on hand so she could let the [facility] know on outreach calls. The patient will start doing this.  . Provided disease specific education to patient.  Education on heart healthy diet and sodium restrictions. The patient is enjoying fresh fruits and vegetables.  The patient denies any issues with  dietary restrictions.  . Evaluation of activity level. The patient verbalized that she is enjoying getting out and being outside due to the prettier weather.  Steele Sizer with appropriate clinical care team members regarding patient needs. Denies needs for CCM team pharmacist and LCSW but knows they are available to help as needed with concerns.   Patient Self Care Activities related to HTN and HLD:  . Patient is unable to independently self-manage chronic health conditions  Please see past updates related to this goal by clicking on the "Past Updates" button in the selected goal      .  RNCM: Pt-"I am having pain in my neck" (pt-stated)        CARE PLAN ENTRY (see longitudinal plan of care for additional care plan information)  Current Barriers:  Marland Kitchen Knowledge Deficits related to new onset of neck pain and discomfort, etiology unknown . Care Coordination needs related to pain management  in a patient with acute on chronic neck pain (disease states) . Chronic Disease Management support and education needs related to pain management of neck pain  Nurse Case Manager Clinical Goal(s):  Marland Kitchen Over the next 120 days, patient will verbalize understanding of plan for management of pain in neck . Over the next 120 days, patient will work with RNCM, pcp and pain clinic to address needs related to new onset of pain in neck . Over the next 120 days, patient will demonstrate a decrease in pain exacerbations as evidenced by decreased pain level or resolution of pain in neck . Over the next 120 days, patient will  attend all scheduled medical appointments: Has appointment with pain clinic on 01-05-2020 but will call to see if she can get a sooner appointment   Interventions:  . Inter-disciplinary care team collaboration (see longitudinal plan of care) . Evaluation of current treatment plan related to pain in neck  and patient's adherence to plan as established by provider. . Advised patient to call the  provider for worsening s/s of pain or changes in the level or intensity of pain . Provided education to patient re: alternative pain management methods such as the use of heat and cold application. The patient has been using heat application and her husband has been massaging her shoulders and neck. The patient states that she is using the lidocaine patches and this is helping. Her neck is still sore but it is better that what it was.  Marland Kitchen Collaborated with pcp and pain clinic  regarding the patients neck pain. The patient has an appointment on 01-05-2020 but will try and get a sooner appointment.  . Discussed plans with patient for ongoing care management follow up and provided patient with direct contact information for care management team . Reviewed scheduled/upcoming provider appointments including: sees the pain clinic on 01-05-2020, has a follow up with pcp on 12-25-2019  Patient Self Care Activities:  . Patient verbalizes understanding of plan to manage neck pain effectively . Self administers medications as prescribed . Attends all scheduled provider appointments . Calls provider office for new concerns or questions . Unable to independently manage acute on chronic neck pain   Initial goal documentation        Patient verbalizes understanding of instructions provided today.   The care management team will reach out to the patient again over the next 30 to 60 days.   Noreene Larsson RN, MSN, [facility] Mobile: [phone]

## 2019-12-25 ENCOUNTER — Ambulatory Visit: Payer: Medicare Other | Admitting: Family Medicine

## 2020-01-05 ENCOUNTER — Encounter: Payer: Self-pay | Admitting: Anesthesiology

## 2020-01-05 ENCOUNTER — Ambulatory Visit (HOSPITAL_BASED_OUTPATIENT_CLINIC_OR_DEPARTMENT_OTHER): Payer: Medicare Other | Admitting: Anesthesiology

## 2020-01-05 ENCOUNTER — Other Ambulatory Visit: Payer: Self-pay | Admitting: Anesthesiology

## 2020-01-05 ENCOUNTER — Other Ambulatory Visit: Payer: Self-pay

## 2020-01-05 ENCOUNTER — Ambulatory Visit
Admission: RE | Admit: 2020-01-05 | Discharge: 2020-01-05 | Disposition: A | Discharge status: Home / Self Care | Payer: Medicare Other | Source: Ambulatory Visit | Attending: Anesthesiology | Admitting: Anesthesiology

## 2020-01-05 VITALS — BP 136/87 | HR 93 | Temp 97.8°F | Resp 16 | Ht 62.0 in | Wt 166.0 lb

## 2020-01-05 DIAGNOSIS — M48062 Spinal stenosis, lumbar region with neurogenic claudication: Secondary | ICD-10-CM

## 2020-01-05 DIAGNOSIS — M5432 Sciatica, left side: Secondary | ICD-10-CM | POA: Diagnosis not present

## 2020-01-05 DIAGNOSIS — M549 Dorsalgia, unspecified: Secondary | ICD-10-CM

## 2020-01-05 DIAGNOSIS — M4716 Other spondylosis with myelopathy, lumbar region: Secondary | ICD-10-CM | POA: Diagnosis not present

## 2020-01-05 DIAGNOSIS — R52 Pain, unspecified: Secondary | ICD-10-CM | POA: Insufficient documentation

## 2020-01-05 DIAGNOSIS — M5136 Other intervertebral disc degeneration, lumbar region: Secondary | ICD-10-CM | POA: Insufficient documentation

## 2020-01-05 MED ORDER — ROPIVACAINE HCL 2 MG/ML IJ SOLN
10.0000 mL | Freq: Once | INTRAMUSCULAR | Status: AC
  Administered 2020-01-05: 10 mL via EPIDURAL

## 2020-01-05 MED ORDER — TRIAMCINOLONE ACETONIDE 40 MG/ML IJ SUSP
40.0000 mg | Freq: Once | INTRAMUSCULAR | Status: AC
  Administered 2020-01-05: 40 mg

## 2020-01-05 MED ORDER — LIDOCAINE HCL (PF) 1 % IJ SOLN
5.0000 mL | Freq: Once | INTRAMUSCULAR | Status: AC
  Administered 2020-01-05: 5 mL via SUBCUTANEOUS

## 2020-01-05 MED ORDER — ROPIVACAINE HCL 2 MG/ML IJ SOLN
INTRAMUSCULAR | Status: AC
  Filled 2020-01-05: qty 10

## 2020-01-05 MED ORDER — SODIUM CHLORIDE (PF) 0.9 % IJ SOLN
INTRAMUSCULAR | Status: AC
  Filled 2020-01-05: qty 10

## 2020-01-05 MED ORDER — IOHEXOL 180 MG/ML  SOLN
10.0000 mL | Freq: Once | INTRAMUSCULAR | Status: AC | PRN
  Administered 2020-01-05: 10 mL via EPIDURAL

## 2020-01-05 MED ORDER — LIDOCAINE HCL 2 % IJ SOLN
INTRAMUSCULAR | Status: AC
  Filled 2020-01-05: qty 20

## 2020-01-05 MED ORDER — SODIUM CHLORIDE 0.9% FLUSH
10.0000 mL | Freq: Once | INTRAVENOUS | Status: AC
  Administered 2020-01-05: 10 mL

## 2020-01-05 MED ORDER — TRIAMCINOLONE ACETONIDE 40 MG/ML IJ SUSP
INTRAMUSCULAR | Status: AC
  Filled 2020-01-05: qty 1

## 2020-01-05 NOTE — Progress Notes (Signed)
Safety precautions to be maintained throughout the outpatient stay will include: orient to surroundings, keep bed in low position, maintain call bell within reach at all times, provide assistance with transfer out of bed and ambulation.  

## 2020-01-05 NOTE — Patient Instructions (Signed)
Pain Management Discharge Instructions  General Discharge Instructions :  If you need to reach your doctor call: Monday-Friday 8:00 am - 4:00 pm at [phone] or toll free [phone].  After clinic hours [phone] to have operator reach doctor.  Bring all of your medication bottles to all your appointments in the pain clinic.  To cancel or reschedule your appointment with Pain Management please remember to call 24 hours in advance to avoid a fee.  Refer to the educational materials which you have been given on: General Risks, I had my Procedure. Discharge Instructions, Post Sedation.  Post Procedure Instructions:  The drugs you were given will stay in your system until tomorrow, so for the next 24 hours you should not drive, make any legal decisions or drink any alcoholic beverages.  You may eat anything you prefer, but it is better to start with liquids then soups and crackers, and gradually work up to solid foods.  Please notify your doctor immediately if you have any unusual bleeding, trouble breathing or pain that is not related to your normal pain.  Depending on the type of procedure that was done, some parts of your body may feel week and/or numb.  This usually clears up by tonight or the next day.  Walk with the use of an assistive device or accompanied by an adult for the 24 hours.  You may use ice on the affected area for the first 24 hours.  Put ice in a Ziploc bag and cover with a towel and place against area 15 minutes on 15 minutes off.  You may switch to heat after 24 hours.Epidural Steroid Injection Patient Information  Description: The epidural space surrounds the nerves as they exit the spinal cord.  In some patients, the nerves can be compressed and inflamed by a bulging disc or a tight spinal canal (spinal stenosis).  By injecting steroids into the epidural space, we can bring irritated nerves into direct contact with a potentially helpful medication.  These  steroids act directly on the irritated nerves and can reduce swelling and inflammation which often leads to decreased pain.  Epidural steroids may be injected anywhere along the spine and from the neck to the low back depending upon the location of your pain.   After numbing the skin with local anesthetic (like Novocaine), a small needle is passed into the epidural space slowly.  You may experience a sensation of pressure while this is being done.  The entire block usually last less than 10 minutes.  Conditions which may be treated by epidural steroids:   Low back and leg pain  Neck and arm pain  Spinal stenosis  Post-laminectomy syndrome  Herpes zoster (shingles) pain  Pain from compression fractures  Preparation for the injection:  1. Do not eat any solid food or dairy products within 8 hours of your appointment.  2. You may drink clear liquids up to 3 hours before appointment.  Clear liquids include water, black coffee, juice or soda.  No milk or cream please. 3. You may take your regular medication, including pain medications, with a sip of water before your appointment  Diabetics should hold regular insulin (if taken separately) and take 1/2 normal NPH dos the morning of the procedure.  Carry some sugar containing items with you to your appointment. 4. A driver must accompany you and be prepared to drive you home after your procedure.  5. Bring all your current medications with your. 6. An IV may be inserted and   sedation may be given at the discretion of the physician.   7. A blood pressure cuff, EKG and other monitors will often be applied during the procedure.  Some patients may need to have extra oxygen administered for a short period. 8. You will be asked to provide medical information, including your allergies, prior to the procedure.  We must know immediately if you are taking blood thinners (like Coumadin/Warfarin)  Or if you are allergic to IV iodine contrast (dye). We must  know if you could possible be pregnant.  Possible side-effects:  Bleeding from needle site  Infection (rare, may require surgery)  Nerve injury (rare)  Numbness & tingling (temporary)  Difficulty urinating (rare, temporary)  Spinal headache ( a headache worse with upright posture)  Light -headedness (temporary)  Pain at injection site (several days)  Decreased blood pressure (temporary)  Weakness in arm/leg (temporary)  Pressure sensation in back/neck (temporary)  Call if you experience:  Fever/chills associated with headache or increased back/neck pain.  Headache worsened by an upright position.  New onset weakness or numbness of an extremity below the injection site  Hives or difficulty breathing (go to the emergency room)  Inflammation or drainage at the infection site  Severe back/neck pain  Any new symptoms which are concerning to you  Please note:  Although the local anesthetic injected can often make your back or neck feel good for several hours after the injection, the pain will likely return.  It takes 3-7 days for steroids to work in the epidural space.  You may not notice any pain relief for at least that one week.  If effective, we will often do a series of three injections spaced 3-6 weeks apart to maximally decrease your pain.  After the initial series, we generally will wait several months before considering a repeat injection of the same type.  If you have any questions, please call [phone]  [facility] 

## 2020-01-05 NOTE — Progress Notes (Signed)
Subjective:  Patient ID: Ellen [name], female    DOB: [birth year]  Age: 79 y.o. MRN: [id number]  CC: Neck Pain   Procedure: L5-S1 epidural steroid under fluoroscopic guidance without sedation  HPI Ellen [name] presents for reevaluation.  She had her last epidural back in May and had 75% improvement in her lower leg pain and her left leg pain.  At this point she is along her having a right leg pain and desires to proceed with a repeat epidural today no change in lower extremity strength or function or bowel or bladder function is noted at this time.  She is not requiring any other medication management at this point as well.  She is trying to stay active and doing her exercises as tolerated.  Outpatient Medications Prior to Visit  Medication Sig Dispense Refill  . amLODipine (NORVASC) 5 MG tablet Take 1 tablet (5 mg total) by mouth daily. 90 tablet 1  . aspirin 81 MG tablet Take 81 mg by mouth daily.    Marland Kitchen atorvastatin (LIPITOR) 20 MG tablet Take 1 tablet (20 mg total) by mouth daily. 90 tablet 1  . CALCIUM-MAGNESIUM-ZINC PO Take 1 tablet by mouth daily.    . calcium-vitamin D 250-100 MG-UNIT tablet Take 1 tablet by mouth 2 (two) times daily.    . Cholecalciferol (VITAMIN D3) 1000 units CAPS Take 2 capsules by mouth daily.     . cyclobenzaprine (FLEXERIL) 10 MG tablet Take 10 mg by mouth at bedtime.    . lidocaine (LIDODERM) 5 % Place 1 patch onto the skin daily. Remove & Discard patch within 12 hours or as directed by MD 30 patch 0  . lisinopril (ZESTRIL) 20 MG tablet Take 1 tablet (20 mg total) by mouth daily. To be taken alongside lisinopril HCTZ 20-25mg  tab 90 tablet 1  . lisinopril-hydrochlorothiazide (ZESTORETIC) 20-25 MG tablet Take 1 tablet by mouth daily. 90 tablet 1  . Magnesium 500 MG TABS Take 1 tablet by mouth daily.    . vitamin C (ASCORBIC ACID) 500 MG tablet Take 500 mg by mouth daily.    . predniSONE (DELTASONE) 10 MG tablet Take 6 tablets by mouth daily for 2  days, then reduce by 1 tablet every 2 days until gone (Patient not taking: Reported on 01/05/2020) 42 tablet 0   No facility-administered medications prior to visit.    Review of Systems CNS: No confusion or sedation Cardiac: No angina or palpitations GI: No abdominal pain or constipation Constitutional: No nausea vomiting fevers or chills  Objective:  BP 136/87   Pulse 93   Temp 97.8 F (36.6 C)   Resp 16   Ht 5\' 2"  (1.575 m)   Wt 166 lb (75.3 kg)   LMP 11/28/1994 (Approximate)   SpO2 100%   BMI 30.36 kg/m    BP Readings from Last 3 Encounters:  01/05/20 136/87  12/03/19 134/78  11/19/19 130/68     Wt Readings from Last 3 Encounters:  01/05/20 166 lb (75.3 kg)  12/03/19 167 lb (75.8 kg)  11/19/19 165 lb 12.8 oz (75.2 kg)     Physical Exam Pt is alert and oriented PERRL EOMI HEART IS RRR no murmur or rub LCTA no wheezing or rales MUSCULOSKELETAL reveals good ambulation with a nonantalgic gait.  Her muscle tone and bulk is at baseline.  Labs  Lab Results  Component Value Date   HGBA1C 6.5 (H) 11/19/2019   HGBA1C 6.5 (H) 05/21/2019   HGBA1C 6.0 (H) 11/08/2018  Lab Results  Component Value Date   MICROALBUR 30 (H) 10/02/2016   LDLCALC 73 11/19/2019   CREATININE 0.85 11/19/2019    -------------------------------------------------------------------------------------------------------------------- Lab Results  Component Value Date   WBC 5.7 05/21/2019   HGB 11.5 05/21/2019   HCT 35.3 05/21/2019   PLT 285 05/21/2019   GLUCOSE 164 (H) 11/19/2019   CHOL 157 11/19/2019   TRIG 212 (H) 11/19/2019   HDL 49 11/19/2019   LDLCALC 73 11/19/2019   ALT 17 11/19/2019   AST 21 11/19/2019   NA 141 11/19/2019   K 3.6 11/19/2019   CL 103 11/19/2019   CREATININE 0.85 11/19/2019   BUN 19 11/19/2019   CO2 24 11/19/2019   TSH 1.370 05/21/2019   HGBA1C 6.5 (H) 11/19/2019   MICROALBUR 30 (H) 10/02/2016     --------------------------------------------------------------------------------------------------------------------- DG PAIN CLINIC C-ARM 1-60 MIN NO REPORT  Result Date: 01/05/2020 Fluoro was used, but no Radiologist interpretation will be provided. Please refer to "NOTES" tab for provider progress note.    Assessment & Plan:   Ellen DandyMary was seen today for neck pain.  Diagnoses and all orders for this visit:  Spinal stenosis of lumbar region with neurogenic claudication -     Lumbar Epidural Injection; Future  Mid back pain on right side  Sciatica of left side -     Lumbar Epidural Injection; Future  DDD (degenerative disc disease), lumbar -     Lumbar Epidural Injection; Future  Spondylosis, lumbar, with myelopathy  Other orders -     triamcinolone acetonide (KENALOG-40) injection 40 mg -     sodium chloride flush (NS) 0.9 % injection 10 mL -     ropivacaine (PF) 2 mg/mL (0.2%) (NAROPIN) injection 10 mL -     lidocaine (PF) (XYLOCAINE) 1 % injection 5 mL -     iohexol (OMNIPAQUE) 180 MG/ML injection 10 mL        ----------------------------------------------------------------------------------------------------------------------  Problem List Items Addressed This Visit    None    Visit Diagnoses    Spinal stenosis of lumbar region with neurogenic claudication    -  Primary   Relevant Medications   triamcinolone acetonide (KENALOG-40) injection 40 mg (Completed)   ropivacaine (PF) 2 mg/mL (0.2%) (NAROPIN) injection 10 mL (Completed)   lidocaine (PF) (XYLOCAINE) 1 % injection 5 mL (Completed)   Other Relevant Orders   Lumbar Epidural Injection   Mid back pain on right side       Relevant Medications   triamcinolone acetonide (KENALOG-40) injection 40 mg (Completed)   Sciatica of left side       Relevant Orders   Lumbar Epidural Injection   DDD (degenerative disc disease), lumbar       Relevant Medications   triamcinolone acetonide (KENALOG-40) injection 40  mg (Completed)   Other Relevant Orders   Lumbar Epidural Injection   Spondylosis, lumbar, with myelopathy       Relevant Medications   triamcinolone acetonide (KENALOG-40) injection 40 mg (Completed)        ----------------------------------------------------------------------------------------------------------------------  1. Spinal stenosis of lumbar region with neurogenic claudication We will proceed with a repeat epidural today.  This is as requested per patient.  Gone of the risks and benefits of the procedure with her in full detail and all questions have been answered.  We will schedule her for return to clinic in 2 months. - Lumbar Epidural Injection; Future  2. Mid back pain on right side As above and continue core stretching strengthening exercises.  3. Sciatica  of left side As above - Lumbar Epidural Injection; Future  4. DDD (degenerative disc disease), lumbar As above - Lumbar Epidural Injection; Future  5. Spondylosis, lumbar, with myelopathy As above    ----------------------------------------------------------------------------------------------------------------------  I am having Ellen [name] maintain her aspirin, Vitamin D3, Magnesium, calcium-vitamin D, vitamin C, cyclobenzaprine, CALCIUM-MAGNESIUM-ZINC PO, lisinopril-hydrochlorothiazide, lisinopril, atorvastatin, amLODipine, lidocaine, and predniSONE. We administered triamcinolone acetonide, sodium chloride flush, ropivacaine (PF) 2 mg/mL (0.2%), lidocaine (PF), and iohexol.   Meds ordered this encounter  Medications  . triamcinolone acetonide (KENALOG-40) injection 40 mg  . sodium chloride flush (NS) 0.9 % injection 10 mL  . ropivacaine (PF) 2 mg/mL (0.2%) (NAROPIN) injection 10 mL  . lidocaine (PF) (XYLOCAINE) 1 % injection 5 mL  . iohexol (OMNIPAQUE) 180 MG/ML injection 10 mL   Patient's Medications  New Prescriptions   No medications on file  Previous Medications   AMLODIPINE (NORVASC)  5 MG TABLET    Take 1 tablet (5 mg total) by mouth daily.   ASPIRIN 81 MG TABLET    Take 81 mg by mouth daily.   ATORVASTATIN (LIPITOR) 20 MG TABLET    Take 1 tablet (20 mg total) by mouth daily.   CALCIUM-MAGNESIUM-ZINC PO    Take 1 tablet by mouth daily.   CALCIUM-VITAMIN D 250-100 MG-UNIT TABLET    Take 1 tablet by mouth 2 (two) times daily.   CHOLECALCIFEROL (VITAMIN D3) 1000 UNITS CAPS    Take 2 capsules by mouth daily.    CYCLOBENZAPRINE (FLEXERIL) 10 MG TABLET    Take 10 mg by mouth at bedtime.   LIDOCAINE (LIDODERM) 5 %    Place 1 patch onto the skin daily. Remove & Discard patch within 12 hours or as directed by MD   LISINOPRIL (ZESTRIL) 20 MG TABLET    Take 1 tablet (20 mg total) by mouth daily. To be taken alongside lisinopril HCTZ 20-25mg  tab   LISINOPRIL-HYDROCHLOROTHIAZIDE (ZESTORETIC) 20-25 MG TABLET    Take 1 tablet by mouth daily.   MAGNESIUM 500 MG TABS    Take 1 tablet by mouth daily.   PREDNISONE (DELTASONE) 10 MG TABLET    Take 6 tablets by mouth daily for 2 days, then reduce by 1 tablet every 2 days until gone   VITAMIN C (ASCORBIC ACID) 500 MG TABLET    Take 500 mg by mouth daily.  Modified Medications   No medications on file  Discontinued Medications   No medications on file   ----------------------------------------------------------------------------------------------------------------------  Follow-up: Return in about 2 months (around 03/07/2020) for procedure, evaluation.   Procedure: L5-S1 LESI with fluoroscopic guidance and no moderate sedation  NOTE: The risks, benefits, and expectations of the procedure have been discussed and explained to the patient who was understanding and in agreement with suggested treatment plan. No guarantees were made.  DESCRIPTION OF PROCEDURE: Lumbar epidural steroid injection with no  IV Versed, EKG, blood pressure, pulse, and pulse oximetry monitoring. The procedure was performed with the patient in the prone position under  fluoroscopic guidance.  Sterile prep x3 was initiated and I then injected subcutaneous lidocaine to the overlying L5-S1 site after its fluoroscopic identifictation.  Using strict aseptic technique, I then advanced an 18-gauge Tuohy epidural needle in the midline using interlaminar approach via loss-of-resistance to saline technique. There was negative aspiration for heme or  CSF.  I then confirmed position with both AP and Lateral fluoroscan.  2 cc of contrast dye were injected and a  total of 5 mL of  Preservative-Free normal saline mixed with 40 mg of Kenalog and 1cc Ropicaine 0.2 percent were injected incrementally via the  epidurally placed needle. The needle was removed. The patient tolerated the injection well and was convalesced and discharged to home in stable condition. Should the patient have any post procedure difficulty they have been instructed on how to contact us for assistance.    Yevette Edwards, MD

## 2020-01-06 ENCOUNTER — Telehealth: Payer: Self-pay | Admitting: *Deleted

## 2020-01-06 NOTE — Telephone Encounter (Signed)
Attempted to call for post procedure follow-up. Message left. 

## 2020-01-20 ENCOUNTER — Ambulatory Visit (INDEPENDENT_AMBULATORY_CARE_PROVIDER_SITE_OTHER): Payer: Medicare Other | Admitting: General Practice

## 2020-01-20 ENCOUNTER — Telehealth: Payer: Self-pay | Admitting: General Practice

## 2020-01-20 DIAGNOSIS — I10 Essential (primary) hypertension: Secondary | ICD-10-CM

## 2020-01-20 DIAGNOSIS — M542 Cervicalgia: Secondary | ICD-10-CM

## 2020-01-20 DIAGNOSIS — E782 Mixed hyperlipidemia: Secondary | ICD-10-CM

## 2020-01-20 NOTE — Patient Instructions (Signed)
Visit Information  Goals Addressed              This Visit's Progress     RNCM: Chronic Conditions: HTN/HLD        CARE PLAN ENTRY (see longtitudinal plan of care for additional care plan information)  Current Barriers:   Chronic Disease Management support, education, and care coordination needs related to HTN and HLD  Clinical Goal(s) related to HTN and HLD:  Over the next 120 days, patient will:   Work with the care management team to address educational, disease management, and care coordination needs   Begin or continue self health monitoring activities as directed today Measure and record blood pressure 2 times per week and adhere to heart healthy diet  Call provider office for new or worsened signs and symptoms Blood pressure findings outside established parameters and New or worsened symptom related to HTN or HLD  Call care management team with questions or concerns  Verbalize basic understanding of patient centered plan of care established today  Interventions related to HTN and HLD:   Evaluation of current treatment plans and patient's adherence to plan as established by provider.  The patient endorses compliance with medications and treatment regimen.   Evaluation of medications. Patient verbalized compliance with medication regimen.   Assessed patient understanding of disease states.  The patient verbalized understanding of her chronic conditions. 01-20-2020: The patient has not new concerns at this time. Feels she is doing well with her chronic conditions.   Assessed patient's education and care coordination needs.  The patient verbalized that her daughter is a Engineer, civil (consulting) and she comes and checks her blood pressures. Encouraged the patient to write down her blood pressure readings and have them on hand so she could let the [facility] know on outreach calls. The patient will start doing this. 01-20-2020: The patient is writing down her numbers and her daughter is still checking her  blood pressure. Readings are WNL.  Praised the patient for regular checks and will continue to monitor for changes.   Provided disease specific education to patient.  Education on heart healthy diet and sodium restrictions. The patient is enjoying fresh fruits and vegetables.  The patient denies any issues with dietary restrictions. 01-20-2020: The patient states that she is eating all kinds of fresh fruits and vegetables. The patient denies any issues with following heart healthy diet.   Evaluation of activity level. The patient verbalized that she is enjoying getting out and being outside due to the prettier weather. 01-20-2020: The patient feels like one of the reasons she was having the neck pain is because she was moving furniture. Education on safety and having help when moving large items.   Collaborated with appropriate clinical care team members regarding patient needs. Denies needs for CCM team pharmacist and LCSW but knows they are available to help as needed with concerns.   Patient Self Care Activities related to HTN and HLD:   Patient is unable to independently self-manage chronic health conditions  Please see past updates related to this goal by clicking on the "Past Updates" button in the selected goal        COMPLETED: RNCM: Pt-"I am having pain in my neck" (pt-stated)        CARE PLAN ENTRY (see longitudinal plan of care for additional care plan information)  Current Barriers: Goal has been completed. The patient is pain free form the pain in her neck. Saw specialist with injection on 01-05-2020.  Knowledge Deficits related  to new onset of neck pain and discomfort, etiology unknown  Care Coordination needs related to pain management  in a patient with acute on chronic neck pain (disease states)  Chronic Disease Management support and education needs related to pain management of neck pain  Nurse Case Manager Clinical Goal(s):   Over the next 120 days, patient will verbalize  understanding of plan for management of pain in neck  Over the next 120 days, patient will work with [facility], pcp and pain clinic to address needs related to new onset of pain in neck  Over the next 120 days, patient will demonstrate a decrease in pain exacerbations as evidenced by decreased pain level or resolution of pain in neck  Over the next 120 days, patient will attend all scheduled medical appointments: Has appointment with pain clinic on 01-05-2020 but will call to see if she can get a sooner appointment   Interventions:   Inter-disciplinary care team collaboration (see longitudinal plan of care)  Evaluation of current treatment plan related to pain in neck  and patient's adherence to plan as established by provider.  Advised patient to call the provider for worsening s/s of pain or changes in the level or intensity of pain  Provided education to patient re: alternative pain management methods such as the use of heat and cold application. The patient has been using heat application and her husband has been massaging her shoulders and neck. The patient states that she is using the lidocaine patches and this is helping. Her neck is still sore but it is better that what it was. 01-20-2020: the patient saw the specialist on 01-05-2020 and received an epidural injection. The patient states that her pain is completely gone now. She has muscle relaxer if needed.   Collaborated with pcp and pain clinic  regarding the patients neck pain. The patient has an appointment on 01-05-2020 but will try and get a sooner appointment. Appointment completed and the patient is pain free today at outreach call.  Discussed plans with patient for ongoing care management follow up and provided patient with direct contact information for care management team  Reviewed scheduled/upcoming provider appointments including: sees the pain clinic on 01-05-2020, has a follow up with pcp on 05-21-2020   Patient Self Care  Activities:   Patient verbalizes understanding of plan to manage neck pain effectively  Self administers medications as prescribed  Attends all scheduled provider appointments  Calls provider office for new concerns or questions  Unable to independently manage acute on chronic neck pain   Please see past updates related to this goal by clicking on the "Past Updates" button in the selected goal         Patient verbalizes understanding of instructions provided today.   Telephone follow up appointment with care management team member scheduled for: 04-06-2020 at 0900  Alto Denver RN, MSN, CCM Community Care Coordinator [place]   [facility] Mobile: [phone]

## 2020-01-20 NOTE — Chronic Care Management (AMB) (Signed)
Chronic Care Management   Follow Up Note   01/20/2020 Name: Ellen [name] MRN: [id number] DOB: 1941-06-01  Referred by: [name], PA-C Reason for referral : Chronic Care Management (RNCM: Follow up Chronic Disease Managment and Care Coordination Needs)   Ellen [name] is a 79 y.o. year old female who is a primary care patient of [name], [place]. The CCM team was consulted for assistance with chronic disease management and care coordination needs.    Review of patient status, including review of consultants reports, relevant laboratory and other test results, and collaboration with appropriate care team members and the patient's provider was performed as part of comprehensive patient evaluation and provision of chronic care management services.    SDOH (Social Determinants of Health) assessments performed: Yes See Care Plan activities for detailed interventions related to [facility])     Outpatient Encounter Medications as of 01/20/2020  Medication Sig  . amLODipine (NORVASC) 5 MG tablet Take 1 tablet (5 mg total) by mouth daily.  Marland Kitchen aspirin 81 MG tablet Take 81 mg by mouth daily.  Marland Kitchen atorvastatin (LIPITOR) 20 MG tablet Take 1 tablet (20 mg total) by mouth daily.  Marland Kitchen CALCIUM-MAGNESIUM-ZINC PO Take 1 tablet by mouth daily.  . calcium-vitamin D 250-100 MG-UNIT tablet Take 1 tablet by mouth 2 (two) times daily.  . Cholecalciferol (VITAMIN D3) 1000 units CAPS Take 2 capsules by mouth daily.   . cyclobenzaprine (FLEXERIL) 10 MG tablet Take 10 mg by mouth at bedtime.  . lidocaine (LIDODERM) 5 % Place 1 patch onto the skin daily. Remove & Discard patch within 12 hours or as directed by MD  . lisinopril (ZESTRIL) 20 MG tablet Take 1 tablet (20 mg total) by mouth daily. To be taken alongside lisinopril HCTZ 20-25mg  tab  . lisinopril-hydrochlorothiazide (ZESTORETIC) 20-25 MG tablet Take 1 tablet by mouth daily.  . Magnesium 500 MG TABS Take 1 tablet by mouth daily.  .  predniSONE (DELTASONE) 10 MG tablet Take 6 tablets by mouth daily for 2 days, then reduce by 1 tablet every 2 days until gone (Patient not taking: Reported on 01/05/2020)  . vitamin C (ASCORBIC ACID) 500 MG tablet Take 500 mg by mouth daily.   No facility-administered encounter medications on file as of 01/20/2020.     Objective:  BP Readings from Last 3 Encounters:  01/05/20 136/87  12/03/19 134/78  11/19/19 130/68    Goals Addressed              This Visit's Progress   .  RNCM: Chronic Conditions: HTN/HLD        CARE PLAN ENTRY (see longtitudinal plan of care for additional care plan information)  Current Barriers:  . Chronic Disease Management support, education, and care coordination needs related to HTN and HLD  Clinical Goal(s) related to HTN and HLD:  Over the next 120 days, patient will:  . Work with the care management team to address educational, disease management, and care coordination needs  . Begin or continue self health monitoring activities as directed today Measure and record blood pressure 2 times per week and adhere to heart healthy diet . Call provider office for new or worsened signs and symptoms Blood pressure findings outside established parameters and New or worsened symptom related to HTN or HLD . Call care management team with questions or concerns . Verbalize basic understanding of patient centered plan of care established today  Interventions related to HTN and HLD:  . Evaluation of current  treatment plans and patient's adherence to plan as established by provider.  The patient endorses compliance with medications and treatment regimen.  . Evaluation of medications. Patient verbalized compliance with medication regimen.  . Assessed patient understanding of disease states.  The patient verbalized understanding of her chronic conditions. 01-20-2020: The patient has not new concerns at this time. Feels she is doing well with her chronic conditions.   . Assessed patient's education and care coordination needs.  The patient verbalized that her daughter is a Engineer, civil (consulting) and she comes and checks her blood pressures. Encouraged the patient to write down her blood pressure readings and have them on hand so she could let the [facility] know on outreach calls. The patient will start doing this. 01-20-2020: The patient is writing down her numbers and her daughter is still checking her blood pressure. Readings are WNL.  Praised the patient for regular checks and will continue to monitor for changes.  . Provided disease specific education to patient.  Education on heart healthy diet and sodium restrictions. The patient is enjoying fresh fruits and vegetables.  The patient denies any issues with dietary restrictions. 01-20-2020: The patient states that she is eating all kinds of fresh fruits and vegetables. The patient denies any issues with following heart healthy diet.  . Evaluation of activity level. The patient verbalized that she is enjoying getting out and being outside due to the prettier weather. 01-20-2020: The patient feels like one of the reasons she was having the neck pain is because she was moving furniture. Education on safety and having help when moving large items.  Steele Sizer with appropriate clinical care team members regarding patient needs. Denies needs for CCM team pharmacist and LCSW but knows they are available to help as needed with concerns.   Patient Self Care Activities related to HTN and HLD:  . Patient is unable to independently self-manage chronic health conditions  Please see past updates related to this goal by clicking on the "Past Updates" button in the selected goal      .  COMPLETED: RNCM: Pt-"I am having pain in my neck" (pt-stated)        CARE PLAN ENTRY (see longitudinal plan of care for additional care plan information)  Current Barriers: Goal has been completed. The patient is pain free form the pain in her neck. Saw specialist  with injection on 01-05-2020. Marland Kitchen Knowledge Deficits related to new onset of neck pain and discomfort, etiology unknown . Care Coordination needs related to pain management  in a patient with acute on chronic neck pain (disease states) . Chronic Disease Management support and education needs related to pain management of neck pain  Nurse Case Manager Clinical Goal(s):  Marland Kitchen Over the next 120 days, patient will verbalize understanding of plan for management of pain in neck . Over the next 120 days, patient will work with RNCM, pcp and pain clinic to address needs related to new onset of pain in neck . Over the next 120 days, patient will demonstrate a decrease in pain exacerbations as evidenced by decreased pain level or resolution of pain in neck . Over the next 120 days, patient will attend all scheduled medical appointments: Has appointment with pain clinic on 01-05-2020 but will call to see if she can get a sooner appointment   Interventions:  . Inter-disciplinary care team collaboration (see longitudinal plan of care) . Evaluation of current treatment plan related to pain in neck  and patient's adherence to plan as established  by provider. . Advised patient to call the provider for worsening s/s of pain or changes in the level or intensity of pain . Provided education to patient re: alternative pain management methods such as the use of heat and cold application. The patient has been using heat application and her husband has been massaging her shoulders and neck. The patient states that she is using the lidocaine patches and this is helping. Her neck is still sore but it is better that what it was. 01-20-2020: the patient saw the specialist on 01-05-2020 and received an epidural injection. The patient states that her pain is completely gone now. She has muscle relaxer if needed.  Marland Kitchen Collaborated with pcp and pain clinic  regarding the patients neck pain. The patient has an appointment on 01-05-2020 but will  try and get a sooner appointment. Appointment completed and the patient is pain free today at outreach call. . Discussed plans with patient for ongoing care management follow up and provided patient with direct contact information for care management team . Reviewed scheduled/upcoming provider appointments including: sees the pain clinic on 01-05-2020, has a follow up with pcp on 05-21-2020   Patient Self Care Activities:  . Patient verbalizes understanding of plan to manage neck pain effectively . Self administers medications as prescribed . Attends all scheduled provider appointments . Calls provider office for new concerns or questions . Unable to independently manage acute on chronic neck pain   Please see past updates related to this goal by clicking on the "Past Updates" button in the selected goal          Plan:   Telephone follow up appointment with care management team member scheduled for:04-06-2020 at 0900   Alto Denver RN, MSN, CCM Community Care Coordinator [place]  [facility] Mobile: [phone]

## 2020-01-26 ENCOUNTER — Encounter: Payer: Self-pay | Admitting: Family Medicine

## 2020-02-16 ENCOUNTER — Other Ambulatory Visit: Payer: Self-pay | Admitting: Anesthesiology

## 2020-02-17 ENCOUNTER — Other Ambulatory Visit: Payer: Self-pay | Admitting: Anesthesiology

## 2020-03-02 ENCOUNTER — Telehealth: Payer: Self-pay | Admitting: Family Medicine

## 2020-03-02 NOTE — Telephone Encounter (Signed)
Previous [facility] patient. Last saw Jolene in June for neck pain and was given Prednisone.

## 2020-03-02 NOTE — Telephone Encounter (Signed)
PT Stated she just wanted to get A Rx of medication was unsure what but it was for pain in her neck pt refused to Schedul apt states she was "just sent in medication last time".Pt requested a call if she needs apt or if she can just get medication prescribed. I offered multiple apt times pt refused stated to send a message first asking if she needed the apt.

## 2020-03-02 NOTE — Telephone Encounter (Signed)
appt

## 2020-03-03 ENCOUNTER — Ambulatory Visit (INDEPENDENT_AMBULATORY_CARE_PROVIDER_SITE_OTHER): Payer: Medicare Other | Admitting: General Practice

## 2020-03-03 DIAGNOSIS — E782 Mixed hyperlipidemia: Secondary | ICD-10-CM | POA: Diagnosis not present

## 2020-03-03 DIAGNOSIS — I10 Essential (primary) hypertension: Secondary | ICD-10-CM

## 2020-03-03 DIAGNOSIS — M542 Cervicalgia: Secondary | ICD-10-CM

## 2020-03-03 NOTE — Patient Instructions (Signed)
Visit Information  Goals Addressed              This Visit's Progress   .  RNCM: Chronic Conditions: HTN/HLD        CARE PLAN ENTRY (see longtitudinal plan of care for additional care plan information)  Current Barriers:  . Chronic Disease Management support, education, and care coordination needs related to HTN and HLD  Clinical Goal(s) related to HTN and HLD:  Over the next 120 days, patient will:  . Work with the care management team to address educational, disease management, and care coordination needs  . Begin or continue self health monitoring activities as directed today Measure and record blood pressure 2 times per week and adhere to heart healthy diet . Call provider office for new or worsened signs and symptoms Blood pressure findings outside established parameters and New or worsened symptom related to HTN or HLD . Call care management team with questions or concerns . Verbalize basic understanding of patient centered plan of care established today  Interventions related to HTN and HLD:  . Evaluation of current treatment plans and patient's adherence to plan as established by provider.  The patient endorses compliance with medications and treatment regimen.  . Evaluation of medications. Patient verbalized compliance with medication regimen.  . Assessed patient understanding of disease states.  The patient verbalized understanding of her chronic conditions. 03-03-2020: The patient says her blood pressure is up a little because of the neck pain and discomfort.   . Assessed patient's education and care coordination needs.  The patient verbalized that her daughter is a Engineer, civil (consulting) and she comes and checks her blood pressures. Encouraged the patient to write down her blood pressure readings and have them on hand so she could let the [facility] know on outreach calls. The patient will start doing this. 01-20-2020: The patient is writing down her numbers and her daughter is still checking her blood  pressure. Readings are WNL.  Praised the patient for regular checks and will continue to monitor for changes. 03-03-2020: The patient was unable to provide readings today as she was concerned about her pain and discomfort in her neck.  . Provided disease specific education to patient.  Education on heart healthy diet and sodium restrictions. The patient is enjoying fresh fruits and vegetables.  The patient denies any issues with dietary restrictions. 03-03-2020:  The patient states that she is eating all kinds of fresh fruits and vegetables. The patient denies any issues with following heart healthy diet.  . Evaluation of activity level. The patient verbalized that she is enjoying getting out and being outside due to the prettier weather. 03-03-2020:  The patient has no explanation of why she is having the same type of neck pain but it being on the right side this time. Denies injury to the site.  Steele Sizer with appropriate clinical care team members regarding patient needs. Denies needs for CCM team pharmacist and LCSW but knows they are available to help as needed with concerns.   Patient Self Care Activities related to HTN and HLD:  . Patient is unable to independently self-manage chronic health conditions  Please see past updates related to this goal by clicking on the "Past Updates" button in the selected goal      .  RNCM: Pt-"I am having pain in my neck" "Now it is on the right side." (pt-stated)        CARE PLAN ENTRY (see longitudinal plan of care for additional care plan  information)  Current Barriers:  Marland Kitchen Knowledge Deficits related to new onset of neck pain and discomfort, etiology unknown . Care Coordination needs related to pain management  in a patient with acute on chronic neck pain (disease states) . Chronic Disease Management support and education needs related to pain management of neck pain: 03-03-2020: New onset of pain in right side of neck going into shoulders. Incoming call  from the patient to see how she could get help with the pain in her right neck and shoulders  Nurse Case Manager Clinical Goal(s):  Marland Kitchen Over the next 120 days, patient will verbalize understanding of plan for management of pain in neck . Over the next 120 days, patient will work with RNCM, pcp and pain clinic to address needs related to new onset of pain in neck . Over the next 120 days, patient will demonstrate a decrease in pain exacerbations as evidenced by decreased pain level or resolution of pain in neck . Over the next 120 days, patient will attend all scheduled medical appointments: Has appointment with pain clinic on 03-29-2020,she has called but can not get a sooner appointment.   Interventions:  . Inter-disciplinary care team collaboration (see longitudinal plan of care) . Evaluation of current treatment plan related to pain in neck  and patient's adherence to plan as established by provider. 03-03-2020: New onset of pain and discomfort in the right side of her neck, moving to her shoulder. The patient states it is at a 10 today on a scale of 0-10.  . Advised patient to call the provider for worsening s/s of pain or changes in the level or intensity of pain.  03-03-2020: The patient called the office yesterday but could not get an appointment to be seen. Will send an in basket message to the pcp to see if she can be seen by another provider.  . Provided education to patient re: alternative pain management methods such as the use of heat and cold application. The patient has been using heat application and her husband has been massaging her shoulders and neck. 03-03-2020: The patient states that she has been using heat and cold application with minimal relief. She has called the pain clinic but can not be seen until 03-29-2020.  Marland Kitchen Collaborated with pcp and pain clinic  regarding the patients neck pain. The patient has an appointment on 03-29-2020 but will try and get a sooner appointment. Will send  an in basket message to the pcp to see if the patient can be seen by a different provider for worsening pain in right neck and shoulder that started on Monday. The patient has had minimal relief with current pain relief measures . Discussed plans with patient for ongoing care management follow up and provided patient with direct contact information for care management team . Reviewed scheduled/upcoming provider appointments including: sees the pain clinic on 03-29-2020, has a follow up with pcp on 05-21-2020   Patient Self Care Activities:  . Patient verbalizes understanding of plan to manage neck pain effectively . Self administers medications as prescribed . Attends all scheduled provider appointments . Calls provider office for new concerns or questions . Unable to independently manage acute on chronic neck pain   Please see past updates related to this goal by clicking on the "Past Updates" button in the selected goal         Patient verbalizes understanding of instructions provided today.   Telephone follow up appointment with care management team member scheduled for:  04-06-2020   Alto Denver RN, MSN, CCM Community Care Coordinator   [facility] Mobile: [phone]

## 2020-03-03 NOTE — Telephone Encounter (Signed)
Pt has apt on 03/09/2020 the patient verbalized understating.

## 2020-03-03 NOTE — Chronic Care Management (AMB) (Signed)
Chronic Care Management   Follow Up Note   03/03/2020 Name: Ellen [name] MRN: [id number] DOB: 10-01-1940  Referred by: [name], PA-C Reason for referral : Chronic Care Management (RNCM: Incoming call from the patient asking for help with neck pain and other concerns)   Ellen [name] is a 79 y.o. year old female who is a primary care patient of [name], [place]. The CCM team was consulted for assistance with chronic disease management and care coordination needs.    Review of patient status, including review of consultants reports, relevant laboratory and other test results, and collaboration with appropriate care team members and the patient's provider was performed as part of comprehensive patient evaluation and provision of chronic care management services.    SDOH (Social Determinants of Health) assessments performed: Yes See Care Plan activities for detailed interventions related to [facility])     Outpatient Encounter Medications as of 03/03/2020  Medication Sig   amLODipine (NORVASC) 5 MG tablet Take 1 tablet (5 mg total) by mouth daily.   aspirin 81 MG tablet Take 81 mg by mouth daily.   atorvastatin (LIPITOR) 20 MG tablet Take 1 tablet (20 mg total) by mouth daily.   CALCIUM-MAGNESIUM-ZINC PO Take 1 tablet by mouth daily.   calcium-vitamin D 250-100 MG-UNIT tablet Take 1 tablet by mouth 2 (two) times daily.   Cholecalciferol (VITAMIN D3) 1000 units CAPS Take 2 capsules by mouth daily.    cyclobenzaprine (FLEXERIL) 10 MG tablet Take 10 mg by mouth at bedtime.   lidocaine (LIDODERM) 5 % Place 1 patch onto the skin daily. Remove & Discard patch within 12 hours or as directed by MD   lisinopril (ZESTRIL) 20 MG tablet Take 1 tablet (20 mg total) by mouth daily. To be taken alongside lisinopril HCTZ 20-25mg  tab   lisinopril-hydrochlorothiazide (ZESTORETIC) 20-25 MG tablet Take 1 tablet by mouth daily.   Magnesium 500 MG TABS Take 1 tablet by  mouth daily.   predniSONE (DELTASONE) 10 MG tablet Take 6 tablets by mouth daily for 2 days, then reduce by 1 tablet every 2 days until gone (Patient not taking: Reported on 01/05/2020)   vitamin C (ASCORBIC ACID) 500 MG tablet Take 500 mg by mouth daily.   No facility-administered encounter medications on file as of 03/03/2020.     Objective:  BP Readings from Last 3 Encounters:  01/05/20 136/87  12/03/19 134/78  11/19/19 130/68    Goals Addressed              This Visit's Progress     RNCM: Chronic Conditions: HTN/HLD        CARE PLAN ENTRY (see longtitudinal plan of care for additional care plan information)  Current Barriers:   Chronic Disease Management support, education, and care coordination needs related to HTN and HLD  Clinical Goal(s) related to HTN and HLD:  Over the next 120 days, patient will:   Work with the care management team to address educational, disease management, and care coordination needs   Begin or continue self health monitoring activities as directed today Measure and record blood pressure 2 times per week and adhere to heart healthy diet  Call provider office for new or worsened signs and symptoms Blood pressure findings outside established parameters and New or worsened symptom related to HTN or HLD  Call care management team with questions or concerns  Verbalize basic understanding of patient centered plan of care established today  Interventions related to HTN and HLD:  Evaluation of current treatment plans and patient's adherence to plan as established by provider.  The patient endorses compliance with medications and treatment regimen.   Evaluation of medications. Patient verbalized compliance with medication regimen.   Assessed patient understanding of disease states.  The patient verbalized understanding of her chronic conditions. 03-03-2020: The patient says her blood pressure is up a little because of the neck pain and  discomfort.    Assessed patient's education and care coordination needs.  The patient verbalized that her daughter is a Engineer, civil (consulting) and she comes and checks her blood pressures. Encouraged the patient to write down her blood pressure readings and have them on hand so she could let the [facility] know on outreach calls. The patient will start doing this. 01-20-2020: The patient is writing down her numbers and her daughter is still checking her blood pressure. Readings are WNL.  Praised the patient for regular checks and will continue to monitor for changes. 03-03-2020: The patient was unable to provide readings today as she was concerned about her pain and discomfort in her neck.   Provided disease specific education to patient.  Education on heart healthy diet and sodium restrictions. The patient is enjoying fresh fruits and vegetables.  The patient denies any issues with dietary restrictions. 03-03-2020:  The patient states that she is eating all kinds of fresh fruits and vegetables. The patient denies any issues with following heart healthy diet.   Evaluation of activity level. The patient verbalized that she is enjoying getting out and being outside due to the prettier weather. 03-03-2020:  The patient has no explanation of why she is having the same type of neck pain but it being on the right side this time. Denies injury to the site.   Collaborated with appropriate clinical care team members regarding patient needs. Denies needs for CCM team pharmacist and LCSW but knows they are available to help as needed with concerns.   Patient Self Care Activities related to HTN and HLD:   Patient is unable to independently self-manage chronic health conditions  Please see past updates related to this goal by clicking on the "Past Updates" button in the selected goal        RNCM: Pt-"I am having pain in my neck" "Now it is on the right side." (pt-stated)        CARE PLAN ENTRY (see longitudinal plan of care for  additional care plan information)  Current Barriers:   Knowledge Deficits related to new onset of neck pain and discomfort, etiology unknown  Care Coordination needs related to pain management  in a patient with acute on chronic neck pain (disease states)  Chronic Disease Management support and education needs related to pain management of neck pain: 03-03-2020: New onset of pain in right side of neck going into shoulders. Incoming call from the patient to see how she could get help with the pain in her right neck and shoulders  Nurse Case Manager Clinical Goal(s):   Over the next 120 days, patient will verbalize understanding of plan for management of pain in neck  Over the next 120 days, patient will work with RNCM, pcp and pain clinic to address needs related to new onset of pain in neck  Over the next 120 days, patient will demonstrate a decrease in pain exacerbations as evidenced by decreased pain level or resolution of pain in neck  Over the next 120 days, patient will attend all scheduled medical appointments: Has appointment with pain clinic on  03-29-2020,she has called but can not get a sooner appointment.   Interventions:   Inter-disciplinary care team collaboration (see longitudinal plan of care)  Evaluation of current treatment plan related to pain in neck  and patient's adherence to plan as established by provider. 03-03-2020: New onset of pain and discomfort in the right side of her neck, moving to her shoulder. The patient states it is at a 10 today on a scale of 0-10.   Advised patient to call the provider for worsening s/s of pain or changes in the level or intensity of pain.  03-03-2020: The patient called the office yesterday but could not get an appointment to be seen. Will send an in basket message to the pcp to see if she can be seen by another provider.   Provided education to patient re: alternative pain management methods such as the use of heat and cold application.  The patient has been using heat application and her husband has been massaging her shoulders and neck. 03-03-2020: The patient states that she has been using heat and cold application with minimal relief. She has called the pain clinic but can not be seen until 03-29-2020.   Collaborated with pcp and pain clinic  regarding the patients neck pain. The patient has an appointment on 03-29-2020 but will try and get a sooner appointment. Will send an in basket message to the pcp to see if the patient can be seen by a different provider for worsening pain in right neck and shoulder that started on Monday. The patient has had minimal relief with current pain relief measures  Discussed plans with patient for ongoing care management follow up and provided patient with direct contact information for care management team  Reviewed scheduled/upcoming provider appointments including: sees the pain clinic on 03-29-2020, has a follow up with pcp on 05-21-2020   Patient Self Care Activities:   Patient verbalizes understanding of plan to manage neck pain effectively  Self administers medications as prescribed  Attends all scheduled provider appointments  Calls provider office for new concerns or questions  Unable to independently manage acute on chronic neck pain   Please see past updates related to this goal by clicking on the "Past Updates" button in the selected goal          Plan:   Telephone follow up appointment with care management team member scheduled for: 04-06-2020 at 0900 am   Alto Denver RN, MSN, CCM Community Care Coordinator [place]   [facility] Mobile: [phone]

## 2020-03-04 ENCOUNTER — Telehealth: Payer: Self-pay | Admitting: Anesthesiology

## 2020-03-04 NOTE — Telephone Encounter (Signed)
Ellen [name] (daughter) called stating Patient is unable to move her neck due to pain and stiffness. Is there anything Dr. Pernell Dupre can call in for her to help with this pain. Please call and let patient know if there is anything she can do.

## 2020-03-05 ENCOUNTER — Other Ambulatory Visit: Payer: Self-pay

## 2020-03-05 ENCOUNTER — Ambulatory Visit (INDEPENDENT_AMBULATORY_CARE_PROVIDER_SITE_OTHER): Payer: Medicare Other | Admitting: Family Medicine

## 2020-03-05 ENCOUNTER — Encounter: Payer: Self-pay | Admitting: Family Medicine

## 2020-03-05 VITALS — BP 131/78 | HR 87 | Temp 98.7°F | Ht 62.0 in | Wt 169.0 lb

## 2020-03-05 DIAGNOSIS — M436 Torticollis: Secondary | ICD-10-CM | POA: Diagnosis not present

## 2020-03-05 DIAGNOSIS — Z23 Encounter for immunization: Secondary | ICD-10-CM | POA: Diagnosis not present

## 2020-03-05 MED ORDER — KETOROLAC TROMETHAMINE 60 MG/2ML IM SOLN
60.0000 mg | Freq: Once | INTRAMUSCULAR | Status: AC
  Administered 2020-03-05: 60 mg via INTRAMUSCULAR

## 2020-03-05 MED ORDER — PREDNISONE 10 MG PO TABS: ORAL_TABLET | ORAL | 0 refills | Status: DC

## 2020-03-05 MED ORDER — BACLOFEN 10 MG PO TABS: 10.0000 mg | ORAL_TABLET | Freq: Three times a day (TID) | ORAL | 0 refills | Status: DC

## 2020-03-05 NOTE — Progress Notes (Signed)
BP 131/78 (BP Location: Left Arm, Patient Position: Sitting)   Pulse 87   Temp 98.7 F (37.1 C) (Oral)   Ht 5\' 2"  (1.575 m)   Wt 169 lb (76.7 kg)   LMP 11/28/1994 (Approximate)   SpO2 99%   BMI 30.91 kg/m    Subjective:    Patient ID: 01/[id number], female    DOB: Jun 11, 1941, 79 y.o.   MRN: [id number]  HPI: Ellen [name] is a 79 y.o. female  Chief Complaint  Patient presents with  . Neck Pain    X1 week, right side of neck,pain radiates to right shoulder, comstant pain, hurts to move neck from side to side and up and down, flexeril is not working  . Flu Vaccine    completed   NECK PAIN FOLLOW UP Status: exacerbated Compliant with recommended treatment: yes Relief with NSAIDs?:  moderate Location:Right Duration: Since Monday Severity: severe Quality: shooting and aching Frequency: constant Radiation: R arm Aggravating factors: movement Alleviating factors: NSAIDs Weakness:  no Paresthesias / decreased sensation:  no  Fevers:  no  Relevant past medical, surgical, family and social history reviewed and updated as indicated. Interim medical history since our last visit reviewed. Allergies and medications reviewed and updated.  Review of Systems  Constitutional: Negative.   Respiratory: Negative.   Cardiovascular: Negative.   Gastrointestinal: Negative.   Musculoskeletal: Positive for arthralgias, myalgias, neck pain and neck stiffness. Negative for back pain, gait problem and joint swelling.  Skin: Negative.   Psychiatric/Behavioral: Negative.     Per HPI unless specifically indicated above     Objective:    BP 131/78 (BP Location: Left Arm, Patient Position: Sitting)   Pulse 87   Temp 98.7 F (37.1 C) (Oral)   Ht 5\' 2"  (1.575 m)   Wt 169 lb (76.7 kg)   LMP 11/28/1994 (Approximate)   SpO2 99%   BMI 30.91 kg/m   Wt Readings from Last 3 Encounters:  03/05/20 169 lb (76.7 kg)  01/05/20 166 lb (75.3 kg)  12/03/19 167 lb (75.8 kg)      Physical Exam Vitals and nursing note reviewed.  Constitutional:      General: She is not in acute distress.    Appearance: Normal appearance. She is not ill-appearing, toxic-appearing or diaphoretic.  HENT:     Head: Normocephalic and atraumatic.     Right Ear: External ear normal.     Left Ear: External ear normal.     Nose: Nose normal.     Mouth/Throat:     Mouth: Mucous membranes are moist.     Pharynx: Oropharynx is clear.  Eyes:     General: No scleral icterus.       Right eye: No discharge.        Left eye: No discharge.     Extraocular Movements: Extraocular movements intact.     Conjunctiva/sclera: Conjunctivae normal.     Pupils: Pupils are equal, round, and reactive to light.  Cardiovascular:     Rate and Rhythm: Normal rate and regular rhythm.     Pulses: Normal pulses.     Heart sounds: Normal heart sounds. No murmur heard.  No friction rub. No gallop.   Pulmonary:     Effort: Pulmonary effort is normal. No respiratory distress.     Breath sounds: Normal breath sounds. No stridor. No wheezing, rhonchi or rales.  Chest:     Chest wall: No tenderness.  Musculoskeletal:     Cervical back: Normal range  of motion and neck supple.     Comments: Hypertonic SCM and paraspinals on the R side of her neck with tenderness to palpation and decreased ROM  Skin:    General: Skin is warm and dry.     Capillary Refill: Capillary refill takes less than 2 seconds.     Coloration: Skin is not jaundiced or pale.     Findings: No bruising, erythema, lesion or rash.  Neurological:     General: No focal deficit present.     Mental Status: She is alert and oriented to person, place, and time. Mental status is at baseline.  Psychiatric:        Mood and Affect: Mood normal.        Behavior: Behavior normal.        Thought Content: Thought content normal.        Judgment: Judgment normal.     Results for orders placed or performed in visit on 11/19/19  Comprehensive metabolic  panel  Result Value Ref Range   Glucose 164 (H) 65 - 99 mg/dL   BUN 19 8 - 27 mg/dL   Creatinine, Ser 5.72 0.57 - 1.00 mg/dL   GFR calc non Af Amer 67 >59 mL/min/1.73   GFR calc Af Amer 77 >59 mL/min/1.73   BUN/Creatinine Ratio 22 12 - 28   Sodium 141 134 - 144 mmol/L   Potassium 3.6 3.5 - 5.2 mmol/L   Chloride 103 96 - 106 mmol/L   CO2 24 20 - 29 mmol/L   Calcium 9.6 8.7 - 10.3 mg/dL   Total Protein 6.9 6.0 - 8.5 g/dL   Albumin 4.1 3.7 - 4.7 g/dL   Globulin, Total 2.8 1.5 - 4.5 g/dL   Albumin/Globulin Ratio 1.5 1.2 - 2.2   Bilirubin Total <0.2 0.0 - 1.2 mg/dL   Alkaline Phosphatase 90 48 - 121 IU/L   AST 21 0 - 40 IU/L   ALT 17 0 - 32 IU/L  Lipid Panel w/o Chol/HDL Ratio  Result Value Ref Range   Cholesterol, Total 157 100 - 199 mg/dL   Triglycerides 620 (H) 0 - 149 mg/dL   HDL 49 >35 mg/dL   VLDL Cholesterol Cal 35 5 - 40 mg/dL   LDL Chol Calc (NIH) 73 0 - 99 mg/dL  HgB D9R  Result Value Ref Range   Hgb A1c MFr Bld 6.5 (H) 4.8 - 5.6 %   Est. average glucose Bld gHb Est-mCnc 140 mg/dL      Assessment & Plan:   Problem List Items Addressed This Visit    None    Visit Diagnoses    Torticollis    -  Primary   Has acute exacerbation of chronic neck pain. Followed by pain clinic. Will give toradol shot and prednisone taper & change flexeril to baclofen. Call w/ concern   Relevant Medications   ketorolac (TORADOL) injection 60 mg (Completed)   Need for immunization against influenza       Flu shot given today.   Relevant Orders   Flu Vaccine QUAD High Dose(Fluad) (Completed)       Follow up plan: Return if symptoms worsen or fail to improve.

## 2020-03-05 NOTE — Telephone Encounter (Signed)
Returned patient call and she states that she is at her PCP and the PCP was going to give her something to help her neck.

## 2020-03-09 ENCOUNTER — Ambulatory Visit: Payer: Medicare Other | Admitting: Family Medicine

## 2020-03-29 ENCOUNTER — Other Ambulatory Visit: Payer: Self-pay

## 2020-03-29 ENCOUNTER — Ambulatory Visit
Admission: RE | Admit: 2020-03-29 | Discharge: 2020-03-29 | Disposition: A | Discharge status: Home / Self Care | Payer: Medicare Other | Source: Ambulatory Visit | Attending: Anesthesiology | Admitting: Anesthesiology

## 2020-03-29 ENCOUNTER — Ambulatory Visit (HOSPITAL_BASED_OUTPATIENT_CLINIC_OR_DEPARTMENT_OTHER): Payer: Medicare Other | Admitting: Anesthesiology

## 2020-03-29 ENCOUNTER — Other Ambulatory Visit: Payer: Self-pay | Admitting: Anesthesiology

## 2020-03-29 ENCOUNTER — Encounter: Payer: Self-pay | Admitting: Anesthesiology

## 2020-03-29 VITALS — BP 142/84 | HR 98 | Temp 97.1°F | Resp 16 | Ht 62.0 in | Wt 168.0 lb

## 2020-03-29 DIAGNOSIS — M791 Myalgia, unspecified site: Secondary | ICD-10-CM | POA: Insufficient documentation

## 2020-03-29 DIAGNOSIS — M5431 Sciatica, right side: Secondary | ICD-10-CM | POA: Insufficient documentation

## 2020-03-29 DIAGNOSIS — M549 Dorsalgia, unspecified: Secondary | ICD-10-CM | POA: Insufficient documentation

## 2020-03-29 DIAGNOSIS — M48062 Spinal stenosis, lumbar region with neurogenic claudication: Secondary | ICD-10-CM

## 2020-03-29 DIAGNOSIS — M5136 Other intervertebral disc degeneration, lumbar region: Secondary | ICD-10-CM

## 2020-03-29 DIAGNOSIS — R52 Pain, unspecified: Secondary | ICD-10-CM

## 2020-03-29 MED ORDER — BACLOFEN 10 MG PO TABS
10.0000 mg | ORAL_TABLET | Freq: Three times a day (TID) | ORAL | 2 refills | Status: AC
Start: 2020-03-29 — End: 2020-04-28

## 2020-03-29 MED ORDER — LIDOCAINE HCL (PF) 1 % IJ SOLN
INTRAMUSCULAR | Status: AC
  Filled 2020-03-29: qty 5

## 2020-03-29 MED ORDER — IOHEXOL 180 MG/ML  SOLN
INTRAMUSCULAR | Status: AC
  Filled 2020-03-29: qty 20

## 2020-03-29 MED ORDER — ROPIVACAINE HCL 2 MG/ML IJ SOLN
INTRAMUSCULAR | Status: AC
  Filled 2020-03-29: qty 10

## 2020-03-29 MED ORDER — SODIUM CHLORIDE (PF) 0.9 % IJ SOLN
INTRAMUSCULAR | Status: AC
  Filled 2020-03-29: qty 10

## 2020-03-29 MED ORDER — TRIAMCINOLONE ACETONIDE 40 MG/ML IJ SUSP
INTRAMUSCULAR | Status: AC
  Filled 2020-03-29: qty 1

## 2020-03-29 NOTE — Patient Instructions (Addendum)
____________________________________________________________________________________________  Post-procedure Information What to expect: Most procedures involve the use of a local anesthetic (numbing medicine), and a steroid (anti-inflammatory medicine).  The local anesthetics may cause temporary numbness and weakness of the legs or arms, depending on the location of the block. This numbness/weakness may last 4-6 hours, depending on the local anesthetic used. In rare instances, it can last up to 24 hours. While numb, you must be very careful not to injure the extremity.  After any procedure, you could expect the pain to get better within 15-20 minutes. This relief is temporary and may last 4-6 hours. Once the local anesthetics wears off, you could experience discomfort, possibly more than usual, for up to 10 (ten) days. In the case of radiofrequencies, it may last up to 6 weeks. Surgeries may take up to 8 weeks for the healing process. The discomfort is due to the irritation caused by needles going through skin and muscle. To minimize the discomfort, we recommend using ice the first day, and heat from then on. The ice should be applied for 15 minutes on, and 15 minutes off. Keep repeating this cycle until bedtime. Avoid applying the ice directly to the skin, to prevent frostbite. Heat should be used daily, until the pain improves (4-10 days). Be careful not to burn yourself.  Occasionally you may experience muscle spasms or cramps. These occur as a consequence of the irritation caused by the needle sticks to the muscle and the blood that will inevitably be lost into the surrounding muscle tissue. Blood tends to be very irritating to tissues, which tend to react by going into spasm. These spasms may start the same day of your procedure, but they may also take days to develop. This late onset type of spasm or cramp is usually caused by electrolyte imbalances triggered by the steroids, at the level of the  kidney. Cramps and spasms tend to respond well to muscle relaxants, multivitamins (some are triggered by the procedure, but may have their origins in vitamin deficiencies), and "Gatorade", or any sports drinks that can replenish any electrolyte imbalances. (If you are a diabetic, ask your pharmacist to get you a sugar-free brand.) Warm showers or baths may also be helpful. Stretching exercises are highly recommended.  General Instructions:  Be alert for signs of possible infection: redness, swelling, heat, red streaks, elevated temperature, and/or fever. These typically appear 4 to 6 days after the procedure. Immediately notify your doctor if you experience unusual bleeding, difficulty breathing, or loss of bowel or bladder control. If you experience increased pain, do not increase your pain medicine intake, unless instructed by your pain physician.  Post-Procedure Care:  Be careful in moving about. Muscle spasms in the area of the injection may occur. Applying ice or heat to the area is often helpful. The incidence of spinal headaches after epidural injections ranges between 1.4% and 6%. If you develop a headache that does not seem to respond to conservative therapy, please let your physician know. This can be treated with an epidural blood patch.   Post-procedure numbness or redness is to be expected, however it should average 4 to 6 hours. If numbness and weakness of your extremities begins to develop 4 to 6 hours after your procedure, and is felt to be progressing and worsening, immediately contact your physician.  Diet:  If you experience nausea, do not eat until this sensation goes away. If you had a "Stellate Ganglion Block" for upper extremity "Reflex Sympathetic Dystrophy", do not eat or   drink until your hoarseness goes away. In any case, always start with liquids first and if you tolerate them well, then slowly progress to more solid foods.  Activity:  For the first 4 to 6 hours after the  procedure, use caution in moving about as you may experience numbness and/or weakness. Use caution in cooking, using household electrical appliances, and climbing steps. If you need to reach your Doctor call our office: [phone] (During business hours) or [phone] (After business hours).  Business Hours: Monday-Thursday 8:00 am - 4:00 PM    Fridays: Closed     In case of an emergency: In case of emergency, call 911 or go to the nearest emergency room and have the physician there call us.  Interpretation of Procedure Every nerve block has two components: a diagnostic component, and a treatment component. Unrealistic expectations are the most common causes of "perceived failure".  In a perfect world, a single nerve block should be able to completely and permanently eliminate the pain. Sadly, the world is not perfect.  Most pain management nerve blocks are performed using local anesthetics and steroids. Steroids are responsible for any long-term benefit that you may experience. Their purpose is to decrease any chronic swelling that may exist in the area. Steroids begin to work immediately after being injected. However, most patients will not experience any benefits until 5 to 10 days after the injection, when the swelling has come down to the point where they can tell a difference. Steroids will only help if there is swelling to be treated. As such, they can assist with the diagnosis. If effective, they suggest an inflammatory component to the pain, and if ineffective, they rule out inflammation as the main cause or component of the problem. If the problem is one of mechanical compression, you will get no benefit from those steroids.   In the case of local anesthetics, they have a crucial role in the diagnosis of your condition. Most will begin to work within15 to 20 minutes after injection. The duration will depend on the type used (short- vs. Long-acting). It is of outmost importance that  patients keep tract of their pain, after the procedure. To assist with this matter, a "Post-procedure Pain Diary" is provided. Make sure to complete it and to bring it back to your follow-up appointment.  As long as the patient keeps accurate, detailed records of their symptoms after every procedure, and returns to have those interpreted, every procedure will provide us with invaluable information. Even a block that does not provide the patient with any relief, will always provide us with information about the mechanism and the origin of the pain. The only time a nerve block can be considered a waste of time is when patients do not keep track of the results, or do not keep their post-procedure appointment.  Reporting the results back to your physician The Pain Score  Pain is a subjective complaint. It cannot be seen, touched, or measured. We depend entirely on the patient's report of the pain in order to assess your condition and treatment. To evaluate the pain, we use a pain scale, where "0" means "No Pain", and a "10" is "the worst possible pain that you can even imagine" (i.e. something like been eaten alive by a shark or being torn apart by a lion).   Use the Pain Scale provided. You will frequently be asked to rate your pain. Please be accurate, remember that medical decisions will be based on your   responses. Please do not rate your pain above a 10. Doing so is actually interpreted as "symptom magnification" (exaggeration). To put this into perspective, when you tell us that your pain is at a 10 (ten), what you are saying is that there is nothing we can do to make this pain any worse. (Carefully think about that.) ____________________________________________________________________________________________  ______________________________________________________________________  Preparing for your procedure (without sedation)  Procedure appointments are limited to planned procedures: No  Prescription Refills. No disability issues will be discussed. No medication changes will be discussed.  Instructions: Oral Intake: Do not eat or drink anything for at least 6 hours prior to your procedure. (Exception: Blood Pressure Medication. See below.) Transportation: Unless otherwise stated by your physician, you may drive yourself after the procedure. Blood Pressure Medicine: Do not forget to take your blood pressure medicine with a sip of water the morning of the procedure. If your Diastolic (lower reading)is above 100 mmHg, elective cases will be cancelled/rescheduled. Blood thinners: These will need to be stopped for procedures. Notify our staff if you are taking any blood thinners. Depending on which one you take, there will be specific instructions on how and when to stop it. Diabetics on insulin: Notify the staff so that you can be scheduled 1st case in the morning. If your diabetes requires high dose insulin, take only  of your normal insulin dose the morning of the procedure and notify the staff that you have done so. Preventing infections: Shower with an antibacterial soap the morning of your procedure.  Build-up your immune system: Take 1000 mg of Vitamin C with every meal (3 times a day) the day prior to your procedure. Antibiotics: Inform the staff if you have a condition or reason that requires you to take antibiotics before dental procedures. Pregnancy: If you are pregnant, call and cancel the procedure. Sickness: If you have a cold, fever, or any active infections, call and cancel the procedure. Arrival: You must be in the facility at least 30 minutes prior to your scheduled procedure. Children: Do not bring any children with you. Dress appropriately: Bring dark clothing that you would not mind if they get stained. Valuables: Do not bring any jewelry or valuables.  Reasons to call and reschedule or cancel your procedure: (Following these recommendations will minimize the  risk of a serious complication.) Surgeries: Avoid having procedures within 2 weeks of any surgery. (Avoid for 2 weeks before or after any surgery). Flu Shots: Avoid having procedures within 2 weeks of a flu shots or . (Avoid for 2 weeks before or after immunizations). Barium: Avoid having a procedure within 7-10 days after having had a radiological study involving the use of radiological contrast. (Myelograms, Barium swallow or enema study). Heart attacks: Avoid any elective procedures or surgeries for the initial 6 months after a "Myocardial Infarction" (Heart Attack). Blood thinners: It is imperative that you stop these medications before procedures. Let us know if you if you take any blood thinner.  Infection: Avoid procedures during or within two weeks of an infection (including chest colds or gastrointestinal problems). Symptoms associated with infections include: Localized redness, fever, chills, night sweats or profuse sweating, burning sensation when voiding, cough, congestion, stuffiness, runny nose, sore throat, diarrhea, nausea, vomiting, cold or Flu symptoms, recent or current infections. It is specially important if the infection is over the area that we intend to treat. Heart and lung problems: Symptoms that may suggest an active cardiopulmonary problem include: cough, chest pain, breathing difficulties or shortness of   breath, dizziness, ankle swelling, uncontrolled high or unusually low blood pressure, and/or palpitations. If you are experiencing any of these symptoms, cancel your procedure and contact your primary care physician for an evaluation.  Remember:  Regular Business hours are:  Monday to Thursday 8:00 AM to 4:00 PM  Provider's Schedule: Francisco Naveira, MD:  Procedure days: Tuesday and Thursday 7:30 AM to 4:00 PM  Bilal Lateef, MD:  Procedure days: Monday and Wednesday 7:30 AM to 4:00 PM ______________________________________________________________________   

## 2020-03-29 NOTE — Progress Notes (Signed)
Subjective:  Patient ID: Ellen [name], female    DOB: [birth year]  Age: 79 y.o. MRN: [id number]  CC: Leg Pain (RIGHT thigh)   Procedure: L5-S1 epidural steroid under fluoroscopic guidance with no sedation  HPI Ellen [name] presents for reevaluation.  She was last seen several months ago and had an epidural at that time.  She normally gets 75 to 80% relief lasting about 2 to 3 months.  She has had gradual recurrence of her right side lower leg pain worse than the left.  This is the same pain she has had previously and has responded to epidurals favorably.  No change in lower extremity strength or function or bowel or bladder function noted at this time.  Per pain quality characteristic is also similar to what she has had in the past.  She is having some mild neck pain with spasming in the central portion of her neck and this was treated in the past with baclofen effectively.  She is asking for refill of that.  It was well-tolerated without side effects.  Otherwise no change in upper or lower extremity strength or function at this time.  Outpatient Medications Prior to Visit  Medication Sig Dispense Refill  . amLODipine (NORVASC) 5 MG tablet Take 1 tablet (5 mg total) by mouth daily. 90 tablet 1  . aspirin 81 MG tablet Take 81 mg by mouth daily.    Marland Kitchen. atorvastatin (LIPITOR) 20 MG tablet Take 1 tablet (20 mg total) by mouth daily. 90 tablet 1  . CALCIUM-MAGNESIUM-ZINC PO Take 1 tablet by mouth daily.    . calcium-vitamin D 250-100 MG-UNIT tablet Take 1 tablet by mouth 2 (two) times daily.    . Cholecalciferol (VITAMIN D3) 1000 units CAPS Take 2 capsules by mouth daily.     . cyclobenzaprine (FLEXERIL) 10 MG tablet Take 10 mg by mouth at bedtime.    Marland Kitchen. lisinopril (ZESTRIL) 20 MG tablet Take 1 tablet (20 mg total) by mouth daily. To be taken alongside lisinopril HCTZ 20-25mg  tab 90 tablet 1  . lisinopril-hydrochlorothiazide (ZESTORETIC) 20-25 MG tablet Take 1 tablet by mouth daily. 90  tablet 1  . Magnesium 500 MG TABS Take 1 tablet by mouth daily.    . vitamin C (ASCORBIC ACID) 500 MG tablet Take 500 mg by mouth daily.    Marland Kitchen. lidocaine (LIDODERM) 5 % Place 1 patch onto the skin daily. Remove & Discard patch within 12 hours or as directed by MD (Patient not taking: Reported on 03/29/2020) 30 patch 0  . predniSONE (DELTASONE) 10 MG tablet 6 tabs today and tomorrow, 5 tabs the next 2 days, then decrease by 1 every other day until gone. (Patient not taking: Reported on 03/29/2020) 42 tablet 0  . baclofen (LIORESAL) 10 MG tablet Take 1 tablet (10 mg total) by mouth 3 (three) times daily. (Patient not taking: Reported on 03/29/2020) 30 each 0   No facility-administered medications prior to visit.    Review of Systems CNS: No confusion or sedation Cardiac: No angina or palpitations GI: No abdominal pain or constipation Constitutional: No nausea vomiting fevers or chills  Objective:  BP (!) 142/84   Pulse 98   Temp (!) 97.1 F (36.2 C) (Temporal)   Resp 16   Ht 5\' 2"  (1.575 m)   Wt 168 lb (76.2 kg)   LMP 11/28/1994 (Approximate)   SpO2 96%   BMI 30.73 kg/m    BP Readings from Last 3 Encounters:  03/29/20 (!) 142/84  03/05/20 131/78  01/05/20 136/87     Wt Readings from Last 3 Encounters:  03/29/20 168 lb (76.2 kg)  03/05/20 169 lb (76.7 kg)  01/05/20 166 lb (75.3 kg)     Physical Exam Pt is alert and oriented PERRL EOMI HEART IS RRR no murmur or rub LCTA no wheezing or rales MUSCULOSKELETAL reveals good muscle tone and bulk to the neck region and upper extremities.  No evidence of any deficit are noted.  She has good muscle tone and bulk affecting the lower extremities well and walks with a mildly antalgic gait.  Labs  Lab Results  Component Value Date   HGBA1C 6.5 (H) 11/19/2019   HGBA1C 6.5 (H) 05/21/2019   HGBA1C 6.0 (H) 11/08/2018   Lab Results  Component Value Date   MICROALBUR 30 (H) 10/02/2016   LDLCALC 73 11/19/2019   CREATININE 0.85  11/19/2019    -------------------------------------------------------------------------------------------------------------------- Lab Results  Component Value Date   WBC 5.7 05/21/2019   HGB 11.5 05/21/2019   HCT 35.3 05/21/2019   PLT 285 05/21/2019   GLUCOSE 164 (H) 11/19/2019   CHOL 157 11/19/2019   TRIG 212 (H) 11/19/2019   HDL 49 11/19/2019   LDLCALC 73 11/19/2019   ALT 17 11/19/2019   AST 21 11/19/2019   NA 141 11/19/2019   K 3.6 11/19/2019   CL 103 11/19/2019   CREATININE 0.85 11/19/2019   BUN 19 11/19/2019   CO2 24 11/19/2019   TSH 1.370 05/21/2019   HGBA1C 6.5 (H) 11/19/2019   MICROALBUR 30 (H) 10/02/2016    --------------------------------------------------------------------------------------------------------------------- DG PAIN CLINIC C-ARM 1-60 MIN NO REPORT  Result Date: 03/29/2020 Fluoro was used, but no Radiologist interpretation will be provided. Please refer to "NOTES" tab for provider progress note.    Assessment & Plan:   [name] was seen today for leg pain.  Diagnoses and all orders for this visit:  Spinal stenosis of lumbar region with neurogenic claudication  Mid back pain on right side  Sciatica of right side  DDD (degenerative disc disease), lumbar  Muscle soreness  Other orders -     baclofen (LIORESAL) 10 MG tablet; Take 1 tablet (10 mg total) by mouth 3 (three) times daily.        ----------------------------------------------------------------------------------------------------------------------  Problem List Items Addressed This Visit      Unprioritized   Muscle soreness    Other Visit Diagnoses    Spinal stenosis of lumbar region with neurogenic claudication    -  Primary   Relevant Medications   baclofen (LIORESAL) 10 MG tablet   Mid back pain on right side       Relevant Medications   baclofen (LIORESAL) 10 MG tablet   Sciatica of right side       Relevant Medications   baclofen (LIORESAL) 10 MG tablet   DDD  (degenerative disc disease), lumbar       Relevant Medications   baclofen (LIORESAL) 10 MG tablet        ----------------------------------------------------------------------------------------------------------------------  1. Spinal stenosis of lumbar region with neurogenic claudication We will proceed with a repeat epidural today.  We gone over the risks and benefits of the procedure with her in full detail and all questions are answered.  I want her to continue with efforts at stretching and strengthening as previously prescribed.  She can continue with her tramadol if necessary.  2. Mid back pain on right side As above and continue with physical therapy exercises  3. Sciatica of right side As above  4. DDD (degenerative disc disease), lumbar As above  5. Muscle soreness As above and she may use baclofen for this as well.  6.  Cervicalgia.  Continue with stretching strengthening as reviewed previously and baclofen 2-3 times a day.  This is as needed.  She is scheduled for a 76-month return to clinic.    ----------------------------------------------------------------------------------------------------------------------  I am having Ellen Spittle. Jubb maintain her aspirin, Vitamin D3, Magnesium, calcium-vitamin D, vitamin C, cyclobenzaprine, CALCIUM-MAGNESIUM-ZINC PO, lisinopril-hydrochlorothiazide, lisinopril, atorvastatin, amLODipine, lidocaine, predniSONE, and baclofen.   Meds ordered this encounter  Medications  . baclofen (LIORESAL) 10 MG tablet    Sig: Take 1 tablet (10 mg total) by mouth 3 (three) times daily.    Dispense:  30 each    Refill:  2   Patient's Medications  New Prescriptions   No medications on file  Previous Medications   AMLODIPINE (NORVASC) 5 MG TABLET    Take 1 tablet (5 mg total) by mouth daily.   ASPIRIN 81 MG TABLET    Take 81 mg by mouth daily.   ATORVASTATIN (LIPITOR) 20 MG TABLET    Take 1 tablet (20 mg total) by mouth daily.    CALCIUM-MAGNESIUM-ZINC PO    Take 1 tablet by mouth daily.   CALCIUM-VITAMIN D 250-100 MG-UNIT TABLET    Take 1 tablet by mouth 2 (two) times daily.   CHOLECALCIFEROL (VITAMIN D3) 1000 UNITS CAPS    Take 2 capsules by mouth daily.    CYCLOBENZAPRINE (FLEXERIL) 10 MG TABLET    Take 10 mg by mouth at bedtime.   LIDOCAINE (LIDODERM) 5 %    Place 1 patch onto the skin daily. Remove & Discard patch within 12 hours or as directed by MD   LISINOPRIL (ZESTRIL) 20 MG TABLET    Take 1 tablet (20 mg total) by mouth daily. To be taken alongside lisinopril HCTZ 20-25mg  tab   LISINOPRIL-HYDROCHLOROTHIAZIDE (ZESTORETIC) 20-25 MG TABLET    Take 1 tablet by mouth daily.   MAGNESIUM 500 MG TABS    Take 1 tablet by mouth daily.   PREDNISONE (DELTASONE) 10 MG TABLET    6 tabs today and tomorrow, 5 tabs the next 2 days, then decrease by 1 every other day until gone.   VITAMIN C (ASCORBIC ACID) 500 MG TABLET    Take 500 mg by mouth daily.  Modified Medications   Modified Medication Previous Medication   BACLOFEN (LIORESAL) 10 MG TABLET baclofen (LIORESAL) 10 MG tablet      Take 1 tablet (10 mg total) by mouth 3 (three) times daily.    Take 1 tablet (10 mg total) by mouth 3 (three) times daily.  Discontinued Medications   No medications on file   ----------------------------------------------------------------------------------------------------------------------  Follow-up: Return in about 3 months (around 06/29/2020) for evaluation, procedure.   Procedure: L5-S1 LESI with fluoroscopic guidance and no moderate sedation  NOTE: The risks, benefits, and expectations of the procedure have been discussed and explained to the patient who was understanding and in agreement with suggested treatment plan. No guarantees were made.  DESCRIPTION OF PROCEDURE: Lumbar epidural steroid injection with no IV Versed, EKG, blood pressure, pulse, and pulse oximetry monitoring. The procedure was performed with the patient in the prone  position under fluoroscopic guidance.  Sterile prep x3 was initiated and I then injected subcutaneous lidocaine to the overlying L5-S1 site after its fluoroscopic identifictation.  Using strict aseptic technique, I then advanced an 18-gauge Tuohy epidural needle in the midline using interlaminar approach via  loss-of-resistance to saline technique. There was negative aspiration for heme or  CSF.  I then confirmed position with both AP and Lateral fluoroscan.  2 cc of contrast dye were injected and a  total of 5 mL of Preservative-Free normal saline mixed with 40 mg of Kenalog and 1cc Ropicaine 0.2 percent were injected incrementally via the  epidurally placed needle. The needle was removed. The patient tolerated the injection well and was convalesced and discharged to home in stable condition. Should the patient have any post procedure difficulty they have been instructed on how to contact us for assistance.    Yevette Edwards, MD

## 2020-03-29 NOTE — Progress Notes (Signed)
Safety precautions to be maintained throughout the outpatient stay will include: orient to surroundings, keep bed in low position, maintain call bell within reach at all times, provide assistance with transfer out of bed and ambulation.  

## 2020-03-30 ENCOUNTER — Telehealth: Payer: Self-pay | Admitting: *Deleted

## 2020-03-30 NOTE — Telephone Encounter (Signed)
Attempted to call for post procedure follow-up. Message left. 

## 2020-04-06 ENCOUNTER — Telehealth: Payer: Self-pay | Admitting: General Practice

## 2020-04-06 ENCOUNTER — Ambulatory Visit (INDEPENDENT_AMBULATORY_CARE_PROVIDER_SITE_OTHER): Payer: Medicare Other | Admitting: General Practice

## 2020-04-06 DIAGNOSIS — M542 Cervicalgia: Secondary | ICD-10-CM

## 2020-04-06 DIAGNOSIS — I10 Essential (primary) hypertension: Secondary | ICD-10-CM

## 2020-04-06 DIAGNOSIS — E782 Mixed hyperlipidemia: Secondary | ICD-10-CM | POA: Diagnosis not present

## 2020-04-06 NOTE — Patient Instructions (Signed)
Visit Information  Goals Addressed              This Visit's Progress     RNCM: Chronic Conditions: HTN/HLD        CARE PLAN ENTRY (see longtitudinal plan of care for additional care plan information)  Current Barriers:   Chronic Disease Management support, education, and care coordination needs related to HTN and HLD  Clinical Goal(s) related to HTN and HLD:  Over the next 120 days, patient will:   Work with the care management team to address educational, disease management, and care coordination needs   Begin or continue self health monitoring activities as directed today Measure and record blood pressure 2 times per week and adhere to heart healthy diet  Call provider office for new or worsened signs and symptoms Blood pressure findings outside established parameters and New or worsened symptom related to HTN or HLD  Call care management team with questions or concerns  Verbalize basic understanding of patient centered plan of care established today  Interventions related to HTN and HLD:   Evaluation of current treatment plans and patient's adherence to plan as established by provider.  The patient endorses compliance with medications and treatment regimen. 04-06-2020: The patient is doing well and denies any issues with her HTN and HLD.  Saw pcp in September.    Evaluation of medications. Patient verbalized compliance with medication regimen.   Assessed patient understanding of disease states.  The patient verbalized understanding of her chronic conditions. 04-06-2020: the patient states that her blood pressure is stable.  o BP Readings from Last 3 Encounters: o  03/29/20 o (!) 142/84 o  03/05/20 o 131/78 o  01/05/20 o 136/87 o     Assessed patient's education and care coordination needs.  The patient verbalized that her daughter is a Engineer, civil (consulting) and she comes and checks her blood pressures. Encouraged the patient to write down her blood pressure readings and have them on  hand so she could let the [facility] know on outreach calls. The patient will start doing this. 01-20-2020: The patient is writing down her numbers and her daughter is still checking her blood pressure. Readings are WNL.  Praised the patient for regular checks and will continue to monitor for changes. 04-06-2020: the patient states her blood pressures have been in 130 to 140 range for systolic and 70's to 80's in diastolic range. She denies any issues with headaches or elevated blood pressures.   Provided disease specific education to patient.  Education on heart healthy diet and sodium restrictions. The patient is enjoying fresh fruits and vegetables.  The patient denies any issues with dietary restrictions. 04-06-2020:  The patient states that she is eating all kinds of fresh fruits and vegetables. The patient denies any issues with following heart healthy diet. The patient denies any SDOH related to food needs.   Evaluation of activity level. The patient verbalized that she is enjoying getting out and being outside due to the prettier weather. 04-06-2020: the patient denies any issues with activity restrictions even with her neck now. She is able to do her normal activities without difficulty. The patient is happy.    Collaborated with appropriate clinical care team members regarding patient needs. Denies needs for CCM team pharmacist and LCSW but knows they are available to help as needed with concerns.   Patient Self Care Activities related to HTN and HLD:   Patient is unable to independently self-manage chronic health conditions  Please see past updates  related to this goal by clicking on the "Past Updates" button in the selected goal        COMPLETED: RNCM: Pt-"I am having pain in my neck" "Now it is on the right side." (pt-stated)        CARE PLAN ENTRY (see longitudinal plan of care for additional care plan information)  Current Barriers: Goal being closed. The neck pain has resolved. See below  for updated notes.   Knowledge Deficits related to new onset of neck pain and discomfort, etiology unknown  Care Coordination needs related to pain management  in a patient with acute on chronic neck pain (disease states)  Chronic Disease Management support and education needs related to pain management of neck pain: 03-03-2020: New onset of pain in right side of neck going into shoulders. Incoming call from the patient to see how she could get help with the pain in her right neck and shoulders.  04-06-2020: the patient denies any neck pain on outreach today. The patient verbalized seeing Dr. Laural Benes on 03-05-2020 and the pain shot and change in muscles relaxer has taken the pain away.   Nurse Case Manager Clinical Goal(s):   Over the next 120 days, patient will verbalize understanding of plan for management of pain in neck  Over the next 120 days, patient will work with RNCM, pcp and pain clinic to address needs related to new onset of pain in neck  Over the next 120 days, patient will demonstrate a decrease in pain exacerbations as evidenced by decreased pain level or resolution of pain in neck  Over the next 120 days, patient will attend all scheduled medical appointments: Has appointment with pain clinic on 03-29-2020,she has called but can not get a sooner appointment.   Interventions:   Inter-disciplinary care team collaboration (see longitudinal plan of care)  Evaluation of current treatment plan related to pain in neck  and patient's adherence to plan as established by provider. 03-03-2020: New onset of pain and discomfort in the right side of her neck, moving to her shoulder. The patient states it is at a 10 today on a scale of 0-10. 04-06-2020: The patient states she has no pain today. The patient states the neck pain has completely resolved and she is doing well.   Advised patient to call the provider for worsening s/s of pain or changes in the level or intensity of pain.  03-03-2020:  The patient called the office yesterday but could not get an appointment to be seen. Will send an in basket message to the pcp to see if she can be seen by another provider. Appointment completed on 03-05-2020 with treatment and recommendations. Next appointment on 05-31-2020 with the pcp.   Provided education to patient re: alternative pain management methods such as the use of heat and cold application. The patient has been using heat application and her husband has been massaging her shoulders and neck. 03-03-2020: The patient states that she has been using heat and cold application with minimal relief. She has called the pain clinic but can not be seen until 03-29-2020. 04-06-2020: Pain is resolved. The patient continues to see the pain specialist per contract.  Denies any acute distress.   Collaborated with pcp and pain clinic  regarding the patients neck pain. The patient has an appointment on 03-29-2020 but will try and get a sooner appointment. Will send an in basket message to the pcp to see if the patient can be seen by a different provider for  worsening pain in right neck and shoulder that started on Monday. The patient has had minimal relief with current pain relief measures  Discussed plans with patient for ongoing care management follow up and provided patient with direct contact information for care management team  Reviewed scheduled/upcoming provider appointments including: sees the pain clinic on 03-29-2020, has a follow up with pcp on 05-21-2020   Patient Self Care Activities:   Patient verbalizes understanding of plan to manage neck pain effectively  Self administers medications as prescribed  Attends all scheduled provider appointments  Calls provider office for new concerns or questions  Unable to independently manage acute on chronic neck pain   Please see past updates related to this goal by clicking on the "Past Updates" button in the selected goal         Patient  verbalizes understanding of instructions provided today.   Telephone follow up appointment with care management team member scheduled for: 06-08-2020 at 10:30 am  Alto Denver RN, MSN, CCM Community Care Coordinator [place]   [facility] Mobile: [phone]

## 2020-04-06 NOTE — Chronic Care Management (AMB) (Signed)
Chronic Care Management   Follow Up Note   04/06/2020 Name: Ellen [name] MRN: [id number] DOB: 04/21/1941  Referred by: [name], PA-C Reason for referral : Chronic Care Management (RNCM Chronic Disease Management and Care Coordination needs)   Ellen [name] is a 79 y.o. year old female who is a primary care patient of [name], [place]. The CCM team was consulted for assistance with chronic disease management and care coordination needs.    Review of patient status, including review of consultants reports, relevant laboratory and other test results, and collaboration with appropriate care team members and the patient's provider was performed as part of comprehensive patient evaluation and provision of chronic care management services.    SDOH (Social Determinants of Health) assessments performed: Yes See Care Plan activities for detailed interventions related to [facility])     Outpatient Encounter Medications as of 04/06/2020  Medication Sig  . amLODipine (NORVASC) 5 MG tablet Take 1 tablet (5 mg total) by mouth daily.  Marland Kitchen aspirin 81 MG tablet Take 81 mg by mouth daily.  Marland Kitchen atorvastatin (LIPITOR) 20 MG tablet Take 1 tablet (20 mg total) by mouth daily.  . baclofen (LIORESAL) 10 MG tablet Take 1 tablet (10 mg total) by mouth 3 (three) times daily.  Marland Kitchen CALCIUM-MAGNESIUM-ZINC PO Take 1 tablet by mouth daily.  . calcium-vitamin D 250-100 MG-UNIT tablet Take 1 tablet by mouth 2 (two) times daily.  . Cholecalciferol (VITAMIN D3) 1000 units CAPS Take 2 capsules by mouth daily.   . cyclobenzaprine (FLEXERIL) 10 MG tablet Take 10 mg by mouth at bedtime.  . lidocaine (LIDODERM) 5 % Place 1 patch onto the skin daily. Remove & Discard patch within 12 hours or as directed by MD (Patient not taking: Reported on 03/29/2020)  . lisinopril (ZESTRIL) 20 MG tablet Take 1 tablet (20 mg total) by mouth daily. To be taken alongside lisinopril HCTZ 20-25mg  tab  .  lisinopril-hydrochlorothiazide (ZESTORETIC) 20-25 MG tablet Take 1 tablet by mouth daily.  . Magnesium 500 MG TABS Take 1 tablet by mouth daily.  . predniSONE (DELTASONE) 10 MG tablet 6 tabs today and tomorrow, 5 tabs the next 2 days, then decrease by 1 every other day until gone. (Patient not taking: Reported on 03/29/2020)  . vitamin C (ASCORBIC ACID) 500 MG tablet Take 500 mg by mouth daily.   No facility-administered encounter medications on file as of 04/06/2020.     Objective:   Goals Addressed              This Visit's Progress   .  RNCM: Chronic Conditions: HTN/HLD        CARE PLAN ENTRY (see longtitudinal plan of care for additional care plan information)  Current Barriers:  . Chronic Disease Management support, education, and care coordination needs related to HTN and HLD  Clinical Goal(s) related to HTN and HLD:  Over the next 120 days, patient will:  . Work with the care management team to address educational, disease management, and care coordination needs  . Begin or continue self health monitoring activities as directed today Measure and record blood pressure 2 times per week and adhere to heart healthy diet . Call provider office for new or worsened signs and symptoms Blood pressure findings outside established parameters and New or worsened symptom related to HTN or HLD . Call care management team with questions or concerns . Verbalize basic understanding of patient centered plan of care established today  Interventions related to HTN  and HLD:  . Evaluation of current treatment plans and patient's adherence to plan as established by provider.  The patient endorses compliance with medications and treatment regimen. 04-06-2020: The patient is doing well and denies any issues with her HTN and HLD.  Saw pcp in September.   . Evaluation of medications. Patient verbalized compliance with medication regimen.  . Assessed patient understanding of disease states.  The  patient verbalized understanding of her chronic conditions. 04-06-2020: the patient states that her blood pressure is stable.  o BP Readings from Last 3 Encounters: o  03/29/20 o (!) 142/84 o  03/05/20 o 131/78 o  01/05/20 o 136/87 o    . Assessed patient's education and care coordination needs.  The patient verbalized that her daughter is a Engineer, civil (consulting) and she comes and checks her blood pressures. Encouraged the patient to write down her blood pressure readings and have them on hand so she could let the [facility] know on outreach calls. The patient will start doing this. 01-20-2020: The patient is writing down her numbers and her daughter is still checking her blood pressure. Readings are WNL.  Praised the patient for regular checks and will continue to monitor for changes. 04-06-2020: the patient states her blood pressures have been in 130 to 140 range for systolic and 70's to 80's in diastolic range. She denies any issues with headaches or elevated blood pressures.  . Provided disease specific education to patient.  Education on heart healthy diet and sodium restrictions. The patient is enjoying fresh fruits and vegetables.  The patient denies any issues with dietary restrictions. 04-06-2020:  The patient states that she is eating all kinds of fresh fruits and vegetables. The patient denies any issues with following heart healthy diet. The patient denies any SDOH related to food needs.  . Evaluation of activity level. The patient verbalized that she is enjoying getting out and being outside due to the prettier weather. 04-06-2020: the patient denies any issues with activity restrictions even with her neck now. She is able to do her normal activities without difficulty. The patient is happy.   Steele Sizer with appropriate clinical care team members regarding patient needs. Denies needs for CCM team pharmacist and LCSW but knows they are available to help as needed with concerns.   Patient Self Care Activities  related to HTN and HLD:  . Patient is unable to independently self-manage chronic health conditions  Please see past updates related to this goal by clicking on the "Past Updates" button in the selected goal      .  COMPLETED: RNCM: Pt-"I am having pain in my neck" "Now it is on the right side." (pt-stated)        CARE PLAN ENTRY (see longitudinal plan of care for additional care plan information)  Current Barriers: Goal being closed. The neck pain has resolved. See below for updated notes.  . Knowledge Deficits related to new onset of neck pain and discomfort, etiology unknown . Care Coordination needs related to pain management  in a patient with acute on chronic neck pain (disease states) . Chronic Disease Management support and education needs related to pain management of neck pain: 03-03-2020: New onset of pain in right side of neck going into shoulders. Incoming call from the patient to see how she could get help with the pain in her right neck and shoulders.  04-06-2020: the patient denies any neck pain on outreach today. The patient verbalized seeing Dr. Laural Benes on 03-05-2020 and the  pain shot and change in muscles relaxer has taken the pain away.   Nurse Case Manager Clinical Goal(s):  Marland Kitchen. Over the next 120 days, patient will verbalize understanding of plan for management of pain in neck . Over the next 120 days, patient will work with RNCM, pcp and pain clinic to address needs related to new onset of pain in neck . Over the next 120 days, patient will demonstrate a decrease in pain exacerbations as evidenced by decreased pain level or resolution of pain in neck . Over the next 120 days, patient will attend all scheduled medical appointments: Has appointment with pain clinic on 03-29-2020,she has called but can not get a sooner appointment.   Interventions:  . Inter-disciplinary care team collaboration (see longitudinal plan of care) . Evaluation of current treatment plan related to pain  in neck  and patient's adherence to plan as established by provider. 03-03-2020: New onset of pain and discomfort in the right side of her neck, moving to her shoulder. The patient states it is at a 10 today on a scale of 0-10. 04-06-2020: The patient states she has no pain today. The patient states the neck pain has completely resolved and she is doing well.  . Advised patient to call the provider for worsening s/s of pain or changes in the level or intensity of pain.  03-03-2020: The patient called the office yesterday but could not get an appointment to be seen. Will send an in basket message to the pcp to see if she can be seen by another provider. Appointment completed on 03-05-2020 with treatment and recommendations. Next appointment on 05-31-2020 with the pcp.  . Provided education to patient re: alternative pain management methods such as the use of heat and cold application. The patient has been using heat application and her husband has been massaging her shoulders and neck. 03-03-2020: The patient states that she has been using heat and cold application with minimal relief. She has called the pain clinic but can not be seen until 03-29-2020. 04-06-2020: Pain is resolved. The patient continues to see the pain specialist per contract.  Denies any acute distress.  Marland Kitchen. Collaborated with pcp and pain clinic  regarding the patients neck pain. The patient has an appointment on 03-29-2020 but will try and get a sooner appointment. Will send an in basket message to the pcp to see if the patient can be seen by a different provider for worsening pain in right neck and shoulder that started on Monday. The patient has had minimal relief with current pain relief measures . Discussed plans with patient for ongoing care management follow up and provided patient with direct contact information for care management team . Reviewed scheduled/upcoming provider appointments including: sees the pain clinic on 03-29-2020, has a  follow up with pcp on 05-21-2020   Patient Self Care Activities:  . Patient verbalizes understanding of plan to manage neck pain effectively . Self administers medications as prescribed . Attends all scheduled provider appointments . Calls provider office for new concerns or questions . Unable to independently manage acute on chronic neck pain   Please see past updates related to this goal by clicking on the "Past Updates" button in the selected goal          Plan:   Telephone follow up appointment with care management team member scheduled for: 06-08-2020 at 10:30 am   Alto DenverPam Tate RN, MSN, CCM Community Care Coordinator [place]  [facility]  Mobile: [phone]

## 2020-04-26 ENCOUNTER — Ambulatory Visit (INDEPENDENT_AMBULATORY_CARE_PROVIDER_SITE_OTHER): Payer: Medicare Other

## 2020-04-26 VITALS — Ht 62.0 in | Wt 164.0 lb

## 2020-04-26 DIAGNOSIS — Z0000 Encounter for general adult medical examination without abnormal findings: Secondary | ICD-10-CM

## 2020-04-26 NOTE — Patient Instructions (Signed)
Ellen [name] , Thank you for taking time to come for your Medicare Wellness Visit. I appreciate your ongoing commitment to your health goals. Please review the following plan we discussed and let me know if I can assist you in the future.   Screening recommendations/referrals: Colonoscopy: completed 03/28/2013, due 03/29/2023 Mammogram: not required Bone Density: completed 08/11/2013 Recommended yearly ophthalmology/optometry visit for glaucoma screening and checkup Recommended yearly dental visit for hygiene and checkup  Vaccinations: Influenza vaccine: completed 03/05/2020, due 01/17/2021 Pneumococcal vaccine: completed 02/02/2014 Tdap vaccine: decline Shingles vaccine:  discussed   Covid-19: 07/28/2019, 07/04/2019, 03/29/2020  Advanced directives: Advance directive discussed with you today.   Conditions/risks identified: none  Next appointment: Follow up in one year for your annual wellness visit    Preventive Care 65 Years and Older, Female Preventive care refers to lifestyle choices and visits with your health care provider that can promote health and wellness. What does preventive care include?  A yearly physical exam. This is also called an annual well check.  Dental exams once or twice a year.  Routine eye exams. Ask your health care provider how often you should have your eyes checked.  Personal lifestyle choices, including:  Daily care of your teeth and gums.  Regular physical activity.  Eating a healthy diet.  Avoiding tobacco and drug use.  Limiting alcohol use.  Practicing safe sex.  Taking low-dose aspirin every day.  Taking vitamin and mineral supplements as recommended by your health care provider. What happens during an annual well check? The services and screenings done by your health care provider during your annual well check will depend on your age, overall health, lifestyle risk factors, and family history of disease. Counseling  Your health care  provider may ask you questions about your:  Alcohol use.  Tobacco use.  Drug use.  Emotional well-being.  Home and relationship well-being.  Sexual activity.  Eating habits.  History of falls.  Memory and ability to understand (cognition).  Work and work Astronomer.  Reproductive health. Screening  You may have the following tests or measurements:  Height, weight, and BMI.  Blood pressure.  Lipid and cholesterol levels. These may be checked every 5 years, or more frequently if you are over 39 years old.  Skin check.  Lung cancer screening. You may have this screening every year starting at age 55 if you have a 30-pack-year history of smoking and currently smoke or have quit within the past 15 years.  Fecal occult blood test (FOBT) of the stool. You may have this test every year starting at age 9.  Flexible sigmoidoscopy or colonoscopy. You may have a sigmoidoscopy every 5 years or a colonoscopy every 10 years starting at age 4.  Hepatitis C blood test.  Hepatitis B blood test.  Sexually transmitted disease (STD) testing.  Diabetes screening. This is done by checking your blood sugar (glucose) after you have not eaten for a while (fasting). You may have this done every 1-3 years.  Bone density scan. This is done to screen for osteoporosis. You may have this done starting at age 47.  Mammogram. This may be done every 1-2 years. Talk to your health care provider about how often you should have regular mammograms. Talk with your health care provider about your test results, treatment options, and if necessary, the need for more tests. Vaccines  Your health care provider may recommend certain vaccines, such as:  Influenza vaccine. This is recommended every year.  Tetanus, diphtheria, and acellular  pertussis (Tdap, Td) vaccine. You may need a Td booster every 10 years.  Zoster vaccine. You may need this after age 51.  Pneumococcal 13-valent conjugate (PCV13)  vaccine. One dose is recommended after age 75.  Pneumococcal polysaccharide (PPSV23) vaccine. One dose is recommended after age 27. Talk to your health care provider about which screenings and vaccines you need and how often you need them. This information is not intended to replace advice given to you by your health care provider. Make sure you discuss any questions you have with your health care provider. Document Released: 07/02/2015 Document Revised: 02/23/2016 Document Reviewed: 04/06/2015 Elsevier Interactive Patient Education  2017 [employer] Prevention in the Home Falls can cause injuries. They can happen to people of all ages. There are many things you can do to make your home safe and to help prevent falls. What can I do on the outside of my home?  Regularly fix the edges of walkways and driveways and fix any cracks.  Remove anything that might make you trip as you walk through a door, such as a raised step or threshold.  Trim any bushes or trees on the path to your home.  Use bright outdoor lighting.  Clear any walking paths of anything that might make someone trip, such as rocks or tools.  Regularly check to see if handrails are loose or broken. Make sure that both sides of any steps have handrails.  Any raised decks and porches should have guardrails on the edges.  Have any leaves, snow, or ice cleared regularly.  Use sand or salt on walking paths during winter.  Clean up any spills in your garage right away. This includes oil or grease spills. What can I do in the bathroom?  Use night lights.  Install grab bars by the toilet and in the tub and shower. Do not use towel bars as grab bars.  Use non-skid mats or decals in the tub or shower.  If you need to sit down in the shower, use a plastic, non-slip stool.  Keep the floor dry. Clean up any water that spills on the floor as soon as it happens.  Remove soap buildup in the tub or shower  regularly.  Attach bath mats securely with double-sided non-slip rug tape.  Do not have throw rugs and other things on the floor that can make you trip. What can I do in the bedroom?  Use night lights.  Make sure that you have a light by your bed that is easy to reach.  Do not use any sheets or blankets that are too big for your bed. They should not hang down onto the floor.  Have a firm chair that has side arms. You can use this for support while you get dressed.  Do not have throw rugs and other things on the floor that can make you trip. What can I do in the kitchen?  Clean up any spills right away.  Avoid walking on wet floors.  Keep items that you use a lot in easy-to-reach places.  If you need to reach something above you, use a strong step stool that has a grab bar.  Keep electrical cords out of the way.  Do not use floor polish or wax that makes floors slippery. If you must use wax, use non-skid floor wax.  Do not have throw rugs and other things on the floor that can make you trip. What can I do with my stairs?  Do not leave any items on the stairs.  Make sure that there are handrails on both sides of the stairs and use them. Fix handrails that are broken or loose. Make sure that handrails are as long as the stairways.  Check any carpeting to make sure that it is firmly attached to the stairs. Fix any carpet that is loose or worn.  Avoid having throw rugs at the top or bottom of the stairs. If you do have throw rugs, attach them to the floor with carpet tape.  Make sure that you have a light switch at the top of the stairs and the bottom of the stairs. If you do not have them, ask someone to add them for you. What else can I do to help prevent falls?  Wear shoes that:  Do not have high heels.  Have rubber bottoms.  Are comfortable and fit you well.  Are closed at the toe. Do not wear sandals.  If you use a stepladder:  Make sure that it is fully  opened. Do not climb a closed stepladder.  Make sure that both sides of the stepladder are locked into place.  Ask someone to hold it for you, if possible.  Clearly mark and make sure that you can see:  Any grab bars or handrails.  First and last steps.  Where the edge of each step is.  Use tools that help you move around (mobility aids) if they are needed. These include:  Canes.  Walkers.  Scooters.  Crutches.  Turn on the lights when you go into a dark area. Replace any light bulbs as soon as they burn out.  Set up your furniture so you have a clear path. Avoid moving your furniture around.  If any of your floors are uneven, fix them.  If there are any pets around you, be aware of where they are.  Review your medicines with your doctor. Some medicines can make you feel dizzy. This can increase your chance of falling. Ask your doctor what other things that you can do to help prevent falls. This information is not intended to replace advice given to you by your health care provider. Make sure you discuss any questions you have with your health care provider. Document Released: 04/01/2009 Document Revised: 11/11/2015 Document Reviewed: 07/10/2014 Elsevier Interactive Patient Education  2017 [employer].

## 2020-04-26 NOTE — Progress Notes (Signed)
I connected with Ellen [name] today by telephone and verified that I am speaking with the correct person using two identifiers. Location patient: home Location provider: work Persons participating in the virtual visit: Ellen [name], Elisha Ponder LPN.   I discussed the limitations, risks, security and privacy concerns of performing an evaluation and management service by telephone and the availability of in person appointments. I also discussed with the patient that there may be a patient responsible charge related to this service. The patient expressed understanding and verbally consented to this telephonic visit.    Interactive audio and video telecommunications were attempted between this provider and patient, however failed, due to patient having technical difficulties OR patient did not have access to video capability.  We continued and completed visit with audio only.     Vital signs may be patient reported or missing.  Subjective:   Ellen [name] is a 79 y.o. female who presents for Medicare Annual (Subsequent) preventive examination.  Review of Systems     Cardiac Risk Factors include: advanced age (>42men, >49 women);dyslipidemia;obesity (BMI >30kg/m2)     Objective:    Today's Vitals   04/26/20 1511  Weight: 164 lb (74.4 kg)  Height:  (1.575 m)   Body mass index is 30 kg/m.  Advanced Directives 04/26/2020 03/29/2020 01/05/2020 11/04/2019 09/26/2019 04/23/2019 04/15/2018  Does Patient Have a Medical Advance Directive? No No No No No No No  Type of Advance Directive - - - - - - -  Would patient like information on creating a medical advance directive? - Yes (MAU/Ambulatory/Procedural Areas - Information given) No - Patient declined - No - Patient declined - Yes (MAU/Ambulatory/Procedural Areas - Information given)    Current Medications (verified) Outpatient Encounter Medications as of 04/26/2020  Medication Sig  . amLODipine (NORVASC) 5 MG tablet Take 1  tablet (5 mg total) by mouth daily.  Marland Kitchen aspirin 81 MG tablet Take 81 mg by mouth daily.  Marland Kitchen atorvastatin (LIPITOR) 20 MG tablet Take 1 tablet (20 mg total) by mouth daily.  . baclofen (LIORESAL) 10 MG tablet Take 1 tablet (10 mg total) by mouth 3 (three) times daily.  Marland Kitchen CALCIUM-MAGNESIUM-ZINC PO Take 1 tablet by mouth daily.  . calcium-vitamin D 250-100 MG-UNIT tablet Take 1 tablet by mouth 2 (two) times daily.  . Cholecalciferol (VITAMIN D3) 1000 units CAPS Take 2 capsules by mouth daily.   . cyclobenzaprine (FLEXERIL) 10 MG tablet Take 10 mg by mouth at bedtime.  Marland Kitchen lisinopril (ZESTRIL) 20 MG tablet Take 1 tablet (20 mg total) by mouth daily. To be taken alongside lisinopril HCTZ 20-25mg  tab  . lisinopril-hydrochlorothiazide (ZESTORETIC) 20-25 MG tablet Take 1 tablet by mouth daily.  . Magnesium 500 MG TABS Take 1 tablet by mouth daily.  . vitamin C (ASCORBIC ACID) 500 MG tablet Take 500 mg by mouth daily.  . [EXPIRED] cyclobenzaprine (FLEXERIL) 10 MG tablet Take 1 tablet (10 mg total) by mouth at bedtime.  . lidocaine (LIDODERM) 5 % Place 1 patch onto the skin daily. Remove & Discard patch within 12 hours or as directed by MD (Patient not taking: Reported on 03/29/2020)  . predniSONE (DELTASONE) 10 MG tablet 6 tabs today and tomorrow, 5 tabs the next 2 days, then decrease by 1 every other day until gone. (Patient not taking: Reported on 03/29/2020)  . [DISCONTINUED] amLODipine (NORVASC) 5 MG tablet Take 1 tablet (5 mg total) by mouth daily.  . [DISCONTINUED] atorvastatin (LIPITOR) 20 MG tablet Take 1 tablet (20  mg total) by mouth daily.  . [DISCONTINUED] cyclobenzaprine (FLEXERIL) 5 MG tablet Take 5 mg by mouth at bedtime.  . [DISCONTINUED] lisinopril (ZESTRIL) 20 MG tablet Take 1 tablet (20 mg total) by mouth daily. To be taken alongside lisinopril HCTZ 20-25mg  tab  . [DISCONTINUED] lisinopril-hydrochlorothiazide (ZESTORETIC) 20-25 MG tablet Take 1 tablet by mouth daily.   No  facility-administered encounter medications on file as of 04/26/2020.    Allergies (verified) Patient has no known allergies.   History: Past Medical History:  Diagnosis Date  . Hyperlipidemia   . Hypertension   . Lumbago   . Menopause   . Migraines    Past Surgical History:  Procedure Laterality Date  . CHOLECYSTECTOMY  1982  . TUBAL LIGATION  1984   Family History  Problem Relation Age of Onset  . Alzheimer's disease Mother   . Diabetes Mother   . Stroke Mother   . Hyperlipidemia Mother   . Stroke Father   . Hypertension Father   . Hyperlipidemia Father   . Diabetes Sister   . Diabetes Brother   . Hypertension Brother   . Diabetes Sister    Social History   Socioeconomic History  . Marital status: Married    Spouse name: Not on file  . Number of children: Not on file  . Years of education: some college  . Highest education level: Some college, no degree  Occupational History  . Occupation: retired  Tobacco Use  . Smoking status: Never Smoker  . Smokeless tobacco: Never Used  Vaping Use  . Vaping Use: Never used  Substance and Sexual Activity  . Alcohol use: No    Alcohol/week: 0.0 standard drinks  . Drug use: No  . Sexual activity: Yes  Other Topics Concern  . Not on file  Social History Narrative  . Not on file   Social Determinants of Health   Financial Resource Strain: Low Risk   . Difficulty of Paying Living Expenses: Not hard at all  Food Insecurity: No Food Insecurity  . Worried About Programme researcher, broadcasting/film/video in the Last Year: Never true  . Ran Out of Food in the Last Year: Never true  Transportation Needs: No Transportation Needs  . Lack of Transportation (Medical): No  . Lack of Transportation (Non-Medical): No  Physical Activity: Sufficiently Active  . Days of Exercise per Week: 3 days  . Minutes of Exercise per Session: 60 min  Stress: No Stress Concern Present  . Feeling of Stress : Not at all  Social Connections: Socially Integrated    . Frequency of Communication with Friends and Family: More than three times a week  . Frequency of Social Gatherings with Friends and Family: More than three times a week  . Attends Religious Services: More than 4 times per year  . Active Member of Clubs or Organizations: Yes  . Attends Banker Meetings: More than 4 times per year  . Marital Status: Married    Tobacco Counseling Counseling given: Not Answered   Clinical Intake:  Pre-visit preparation completed: Yes  Pain : No/denies pain     Nutritional Status: BMI > 30  Obese Nutritional Risks: None Diabetes: No  How often do you need to have someone help you when you read instructions, pamphlets, or other written materials from your doctor or pharmacy?: 1 - Never  Diabetic? no  Interpreter Needed?: No  Information entered by :: NAllen LPN   Activities of Daily Living In your present state of  health, do you have any difficulty performing the following activities: 04/26/2020 05/21/2019  Hearing? N N  Vision? N N  Difficulty concentrating or making decisions? N N  Walking or climbing stairs? N N  Dressing or bathing? N N  Doing errands, shopping? N N  Preparing Food and eating ? N -  Using the Toilet? N -  In the past six months, have you accidently leaked urine? N -  Comment wear pad in case -  Do you have problems with loss of bowel control? N -  Managing your Medications? N -  Managing your Finances? N -  Housekeeping or managing your Housekeeping? N -  Some recent data might be hidden    Patient Care Team: Particia NearingLane, Rachel Elizabeth, PA-C as PCP - General (Family Medicine) Marlowe Saxate, Pamela J, RN as Case Manager (General Practice)  Indicate any recent Medical Services you may have received from other than [facility] providers in the past year (date may be approximate).     Assessment:   [facility]  Hearing/Vision screen  Hearing Screening   125Hz  250Hz  500Hz  1000Hz   2000Hz  3000Hz  4000Hz  6000Hz  8000Hz   Right ear:           Left ear:           Vision Screening Comments: No regular eye exams, [facility]  Dietary issues and exercise activities discussed: Current Exercise Habits: Home exercise routine, Type of exercise: walking, Time (Minutes): 60, Frequency (Times/Week): 3, Weekly Exercise (Minutes/Week): 180  Goals    . Increase water intake     Recommend drinking at least 5-6 glasses of water day     . Patient Stated     04/26/2020, no goals    . RNCM: Chronic Conditions: HTN/HLD     CARE PLAN ENTRY (see longtitudinal plan of care for additional care plan information)  Current Barriers:  . Chronic Disease Management support, education, and care coordination needs related to HTN and HLD  Clinical Goal(s) related to HTN and HLD:  Over the next 120 days, patient will:  . Work with the care management team to address educational, disease management, and care coordination needs  . Begin or continue self health monitoring activities as directed today Measure and record blood pressure 2 times per week and adhere to heart healthy diet . Call provider office for new or worsened signs and symptoms Blood pressure findings outside established parameters and New or worsened symptom related to HTN or HLD . Call care management team with questions or concerns . Verbalize basic understanding of patient centered plan of care established today  Interventions related to HTN and HLD:  . Evaluation of current treatment plans and patient's adherence to plan as established by provider.  The patient endorses compliance with medications and treatment regimen. 04-06-2020: The patient is doing well and denies any issues with her HTN and HLD.  Saw pcp in September.   . Evaluation of medications. Patient verbalized compliance with medication regimen.  . Assessed patient understanding of disease states.  The patient verbalized understanding of her chronic conditions.  04-06-2020: the patient states that her blood pressure is stable.  o BP Readings from Last 3 Encounters: o  03/29/20 o (!) 142/84 o  03/05/20 o 131/78 o  01/05/20 o 136/87 o    . Assessed patient's education and care coordination needs.  The patient verbalized that her daughter is a Engineer, civil (consulting)nurse and she comes and checks her blood pressures. Encouraged the  patient to write down her blood pressure readings and have them on hand so she could let the [facility] know on outreach calls. The patient will start doing this. 01-20-2020: The patient is writing down her numbers and her daughter is still checking her blood pressure. Readings are WNL.  Praised the patient for regular checks and will continue to monitor for changes. 04-06-2020: the patient states her blood pressures have been in 130 to 140 range for systolic and 70's to 80's in diastolic range. She denies any issues with headaches or elevated blood pressures.  . Provided disease specific education to patient.  Education on heart healthy diet and sodium restrictions. The patient is enjoying fresh fruits and vegetables.  The patient denies any issues with dietary restrictions. 04-06-2020:  The patient states that she is eating all kinds of fresh fruits and vegetables. The patient denies any issues with following heart healthy diet. The patient denies any SDOH related to food needs.  . Evaluation of activity level. The patient verbalized that she is enjoying getting out and being outside due to the prettier weather. 04-06-2020: the patient denies any issues with activity restrictions even with her neck now. She is able to do her normal activities without difficulty. The patient is happy.   Steele Sizer with appropriate clinical care team members regarding patient needs. Denies needs for CCM team pharmacist and LCSW but knows they are available to help as needed with concerns.   Patient Self Care Activities related to HTN and HLD:  . Patient is unable to independently  self-manage chronic health conditions  Please see past updates related to this goal by clicking on the "Past Updates" button in the selected goal        Depression Screen PHQ 2/9 Scores 04/26/2020 01/05/2020 11/04/2019 09/26/2019 05/21/2019 04/23/2019 04/15/2018  PHQ - 2 Score 0 0 0 0 0 0 0  PHQ- 9 Score - - - - - - -    Fall Risk Fall Risk  04/26/2020 03/29/2020 01/05/2020 11/04/2019 05/21/2019  Falls in the past year? 0 0 0 0 0  Number falls in past yr: - - - - 0  Injury with Fall? - - - - 0  Risk for fall due to : Medication side effect - - - -  Follow up Falls evaluation completed;Education provided;Falls prevention discussed - - - Falls evaluation completed    Any stairs in or around the home? Yes  If so, are there any without handrails? No  Home free of loose throw rugs in walkways, pet beds, electrical cords, etc? Yes  Adequate lighting in your home to reduce risk of falls? Yes   ASSISTIVE DEVICES UTILIZED TO PREVENT FALLS:  Life alert? No  Use of a cane, walker or w/c? No  Grab bars in the bathroom? No  Shower chair or bench in shower? No  Elevated toilet seat or a handicapped toilet? No   TIMED UP AND GO:  Was the test performed? No .      Cognitive Function:     6CIT Screen 04/26/2020 04/23/2019 04/15/2018 04/13/2017  What Year? 0 points 0 points 0 points 0 points  What month? 0 points 0 points 0 points 0 points  What time? 0 points 0 points 0 points 0 points  Count back from 20 0 points 0 points 0 points 0 points  Months in reverse 0 points 0 points 0 points 0 points  Repeat phrase 2 points 0 points 2 points 0 points  Total Score  2 0 2 0    Immunizations Immunization History  Administered Date(s) Administered  . Fluad Quad(high Dose 65+) 03/18/2019, 03/05/2020  . Influenza, High Dose Seasonal PF 03/06/2016, 04/13/2017, 04/15/2018  . Influenza,inj,Quad PF,6+ Mos 03/30/2015  . PFIZER SARS-COV-2 Vaccination 07/04/2019, 07/28/2019  . Pneumococcal Conjugate-13  02/02/2014  . Pneumococcal Polysaccharide-23 01/27/2013  . Td 07/21/2008    TDAP status: Due, Education has been provided regarding the importance of this vaccine. Advised may receive this vaccine at local pharmacy or Health Dept. Aware to provide a copy of the vaccination record if obtained from local pharmacy or Health Dept. Verbalized acceptance and understanding. Flu Vaccine status: Up to date Pneumococcal vaccine status: Up to date Covid-19 vaccine status: Completed vaccines  Qualifies for Shingles Vaccine? Yes   Zostavax completed No   Shingrix Completed?: No.    Education has been provided regarding the importance of this vaccine. Patient has been advised to call insurance company to determine out of pocket expense if they have not yet received this vaccine. Advised may also receive vaccine at local pharmacy or Health Dept. Verbalized acceptance and understanding.  Screening Tests Health Maintenance  Topic Date Due  . Hepatitis C Screening  Never done  . TETANUS/TDAP  05/20/2020 (Originally 07/21/2018)  . MAMMOGRAM  06/05/2020  . INFLUENZA VACCINE  Completed  . DEXA SCAN  Completed  . COVID-19 Vaccine  Completed  . PNA vac Low Risk Adult  Completed    Health Maintenance  Health Maintenance Due  Topic Date Due  . Hepatitis C Screening  Never done    Colorectal cancer screening: Completed 03/28/2013. Repeat every 10 years Mammogram status: No longer required.  Bone Density status: Completed 08/11/2013.   Lung Cancer Screening: (Low Dose CT Chest recommended if Age 40-80 years, 30 pack-year currently smoking OR have quit w/in 15years.) does not qualify.   Lung Cancer Screening Referral: no  Additional Screening:  Hepatitis C Screening: does qualify; due  Vision Screening: Recommended annual ophthalmology exams for early detection of glaucoma and other disorders of the eye. Is the patient up to date with their annual eye exam?  No  Who is the provider or what is the  name of the office in which the patient attends annual eye exams? [facility] If pt is not established with a provider, would they like to be referred to a provider to establish care? No .   Dental Screening: Recommended annual dental exams for proper oral hygiene  Community Resource Referral / Chronic Care Management: CRR required this visit?  No   CCM required this visit?  No      Plan:     I have personally reviewed and noted the following in the patient's chart:   . Medical and social history . Use of alcohol, tobacco or illicit drugs  . Current medications and supplements . Functional ability and status . Nutritional status . Physical activity . Advanced directives . List of other physicians . Hospitalizations, surgeries, and ER visits in previous 12 months . Vitals . Screenings to include cognitive, depression, and falls . Referrals and appointments  In addition, I have reviewed and discussed with patient certain preventive protocols, quality metrics, and best practice recommendations. A written personalized care plan for preventive services as well as general preventive health recommendations were provided to patient.     Barb Merino, LPN   06/25/4942   Nurse Notes:

## 2020-05-10 IMAGING — MG DIGITAL SCREENING BILATERAL MAMMOGRAM WITH TOMO AND CAD
8 series · 8 of 24 positions shown · non-contrast
Comparison: Previous exam(s).

CLINICAL DATA: Screening.

EXAM:
DIGITAL SCREENING BILATERAL MAMMOGRAM WITH TOMO AND CAD

[L MLO synth-2D]
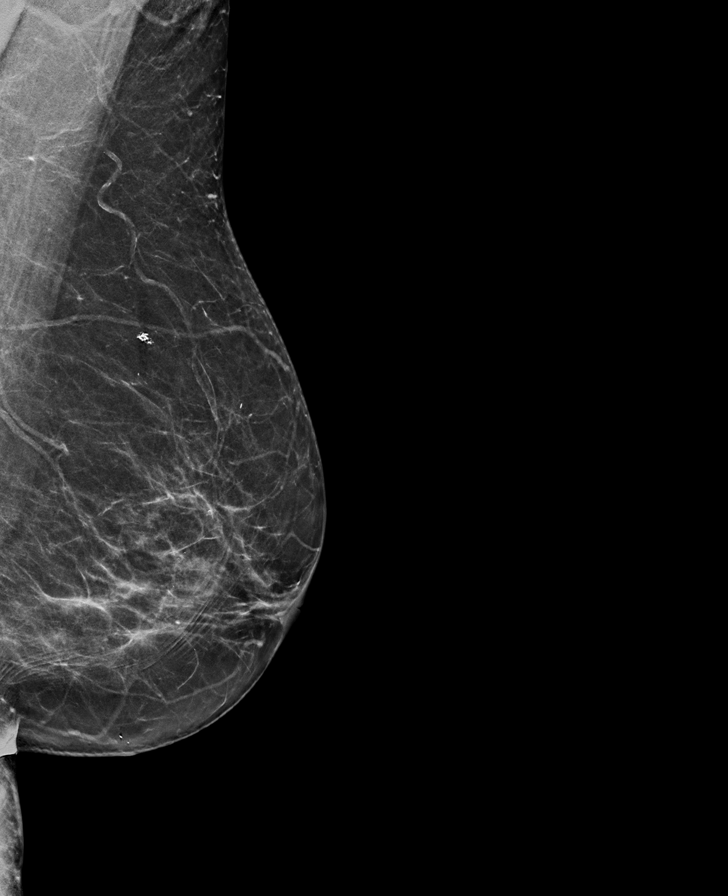

[R CC synth-2D]
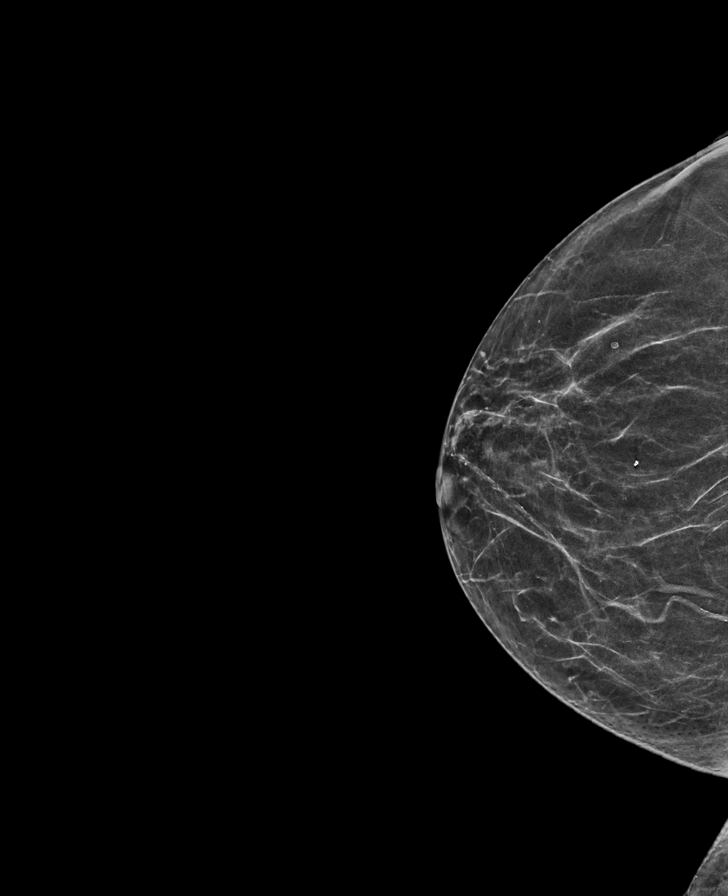

[R MLO synth-2D]
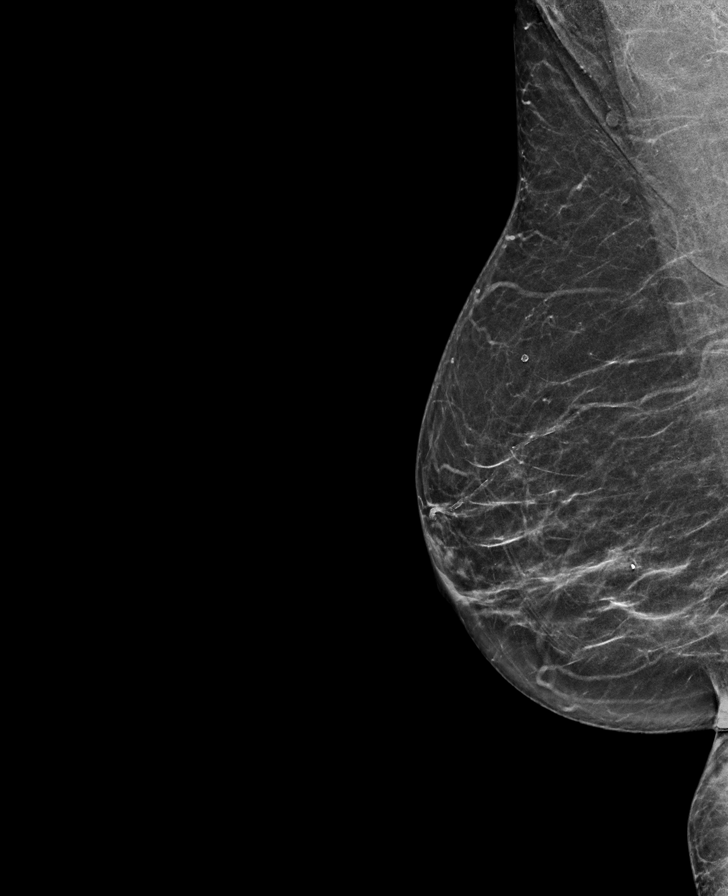

[L CC synth-2D]
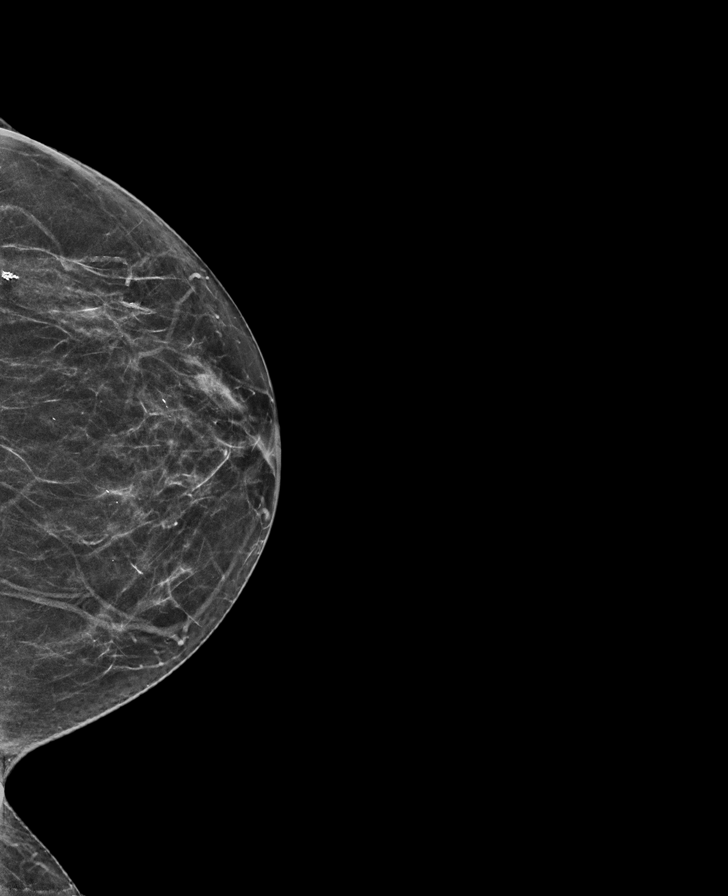

[L MLO tomo · tomo slice 30/59.0]
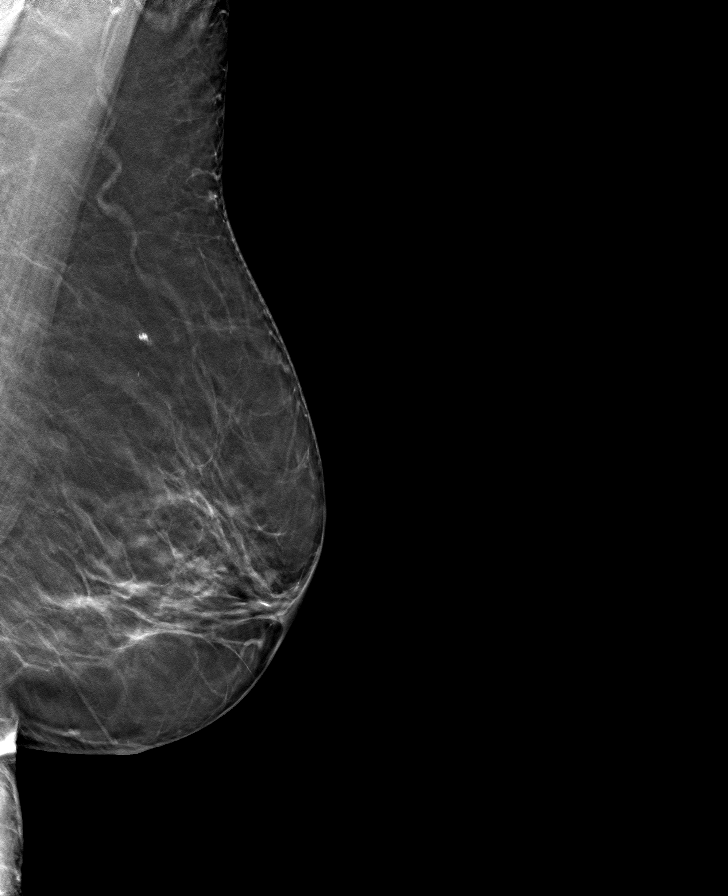

[L CC tomo · tomo slice 29/56.0]
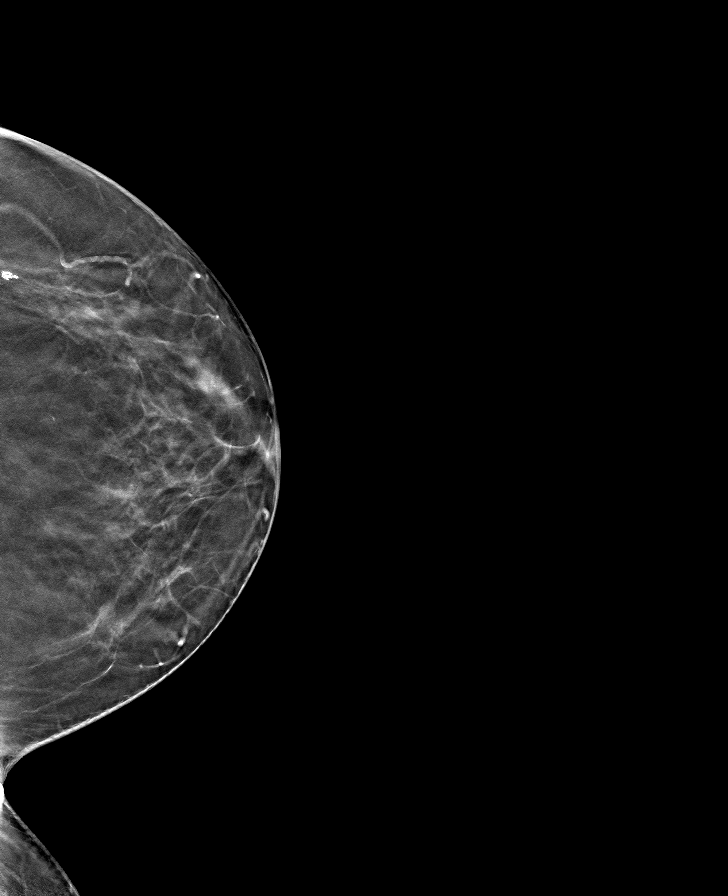

[R CC tomo · tomo slice 27/54.0]
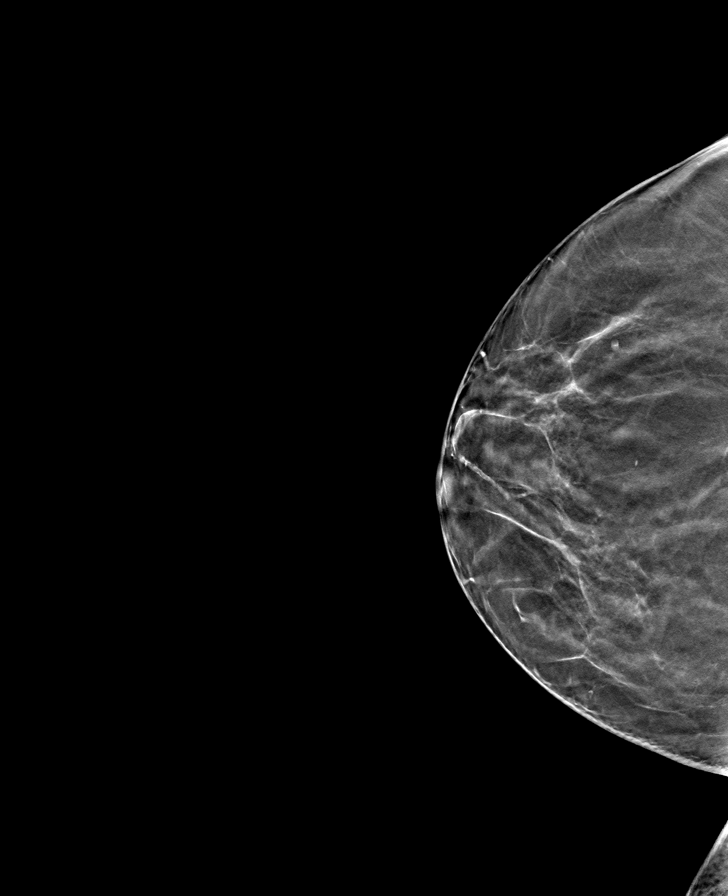

[R MLO tomo · tomo slice 28/55.0]
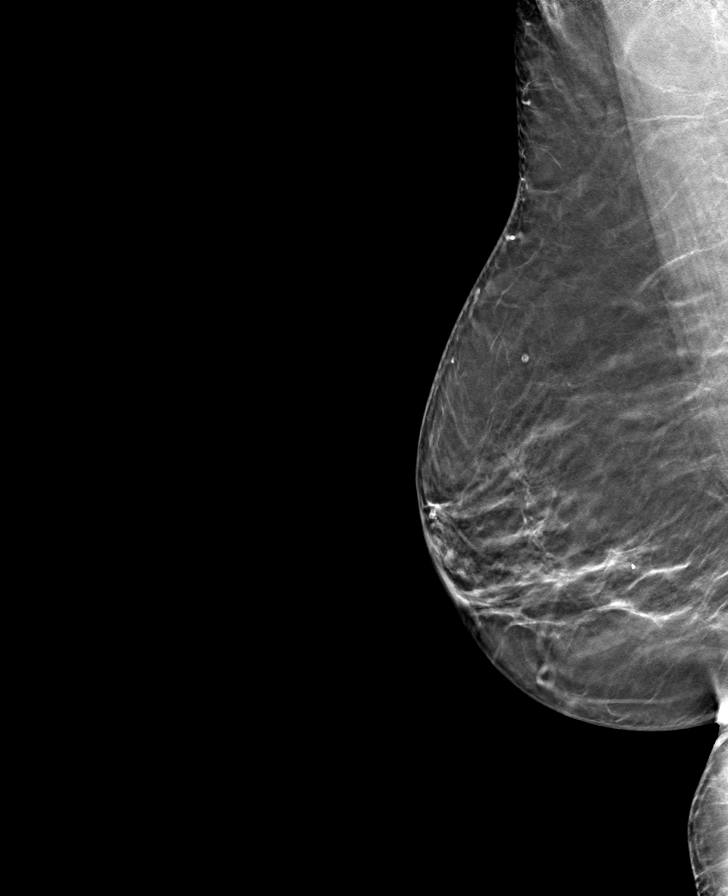

[8 of 24 positions shown; findings below may reference images not displayed]

ACR Breast Density Category b: There are scattered areas of
fibroglandular density.
FINDINGS: There are no findings suspicious for malignancy. Images were
processed with CAD.
IMPRESSION: No mammographic evidence of malignancy. A result letter of this
screening mammogram will be mailed directly to the patient.

RECOMMENDATION:
Screening mammogram in one year. (Code:[id number])

BI-RADS CATEGORY  1: Negative.

## 2020-05-19 ENCOUNTER — Telehealth: Payer: Self-pay

## 2020-05-19 NOTE — Telephone Encounter (Signed)
Pt stated she only wanted to verify what day and time was the apt.

## 2020-05-19 NOTE — Telephone Encounter (Signed)
Copied from CRM [id number]. Topic: General - Other >> May 19, 2020 11:09 AM Gwenlyn Fudge wrote: Reason for CRM: Pts daughter called and is requesting to speak with [place]. She would not give further details. Please advise.

## 2020-05-21 ENCOUNTER — Encounter: Payer: Medicare Other | Admitting: Family Medicine

## 2020-05-31 ENCOUNTER — Ambulatory Visit (INDEPENDENT_AMBULATORY_CARE_PROVIDER_SITE_OTHER): Payer: Medicare Other | Admitting: Nurse Practitioner

## 2020-05-31 ENCOUNTER — Other Ambulatory Visit: Payer: Self-pay

## 2020-05-31 ENCOUNTER — Encounter: Payer: Self-pay | Admitting: Nurse Practitioner

## 2020-05-31 VITALS — BP 129/77 | HR 97 | Temp 98.1°F | Ht 61.02 in | Wt 164.2 lb

## 2020-05-31 DIAGNOSIS — G8929 Other chronic pain: Secondary | ICD-10-CM

## 2020-05-31 DIAGNOSIS — E669 Obesity, unspecified: Secondary | ICD-10-CM | POA: Insufficient documentation

## 2020-05-31 DIAGNOSIS — E66811 Obesity, class 1: Secondary | ICD-10-CM

## 2020-05-31 DIAGNOSIS — Z6831 Body mass index (BMI) 31.0-31.9, adult: Secondary | ICD-10-CM

## 2020-05-31 DIAGNOSIS — Z1159 Encounter for screening for other viral diseases: Secondary | ICD-10-CM | POA: Diagnosis not present

## 2020-05-31 DIAGNOSIS — E782 Mixed hyperlipidemia: Secondary | ICD-10-CM | POA: Diagnosis not present

## 2020-05-31 DIAGNOSIS — I10 Essential (primary) hypertension: Secondary | ICD-10-CM | POA: Diagnosis not present

## 2020-05-31 DIAGNOSIS — Z0000 Encounter for general adult medical examination without abnormal findings: Secondary | ICD-10-CM | POA: Diagnosis not present

## 2020-05-31 DIAGNOSIS — M545 Low back pain, unspecified: Secondary | ICD-10-CM

## 2020-05-31 DIAGNOSIS — E6609 Other obesity due to excess calories: Secondary | ICD-10-CM

## 2020-05-31 DIAGNOSIS — I700 Atherosclerosis of aorta: Secondary | ICD-10-CM

## 2020-05-31 DIAGNOSIS — R7301 Impaired fasting glucose: Secondary | ICD-10-CM

## 2020-05-31 LAB — MICROALBUMIN, URINE WAIVED
Creatinine, Urine Waived: 50 mg/dL (ref 10–300)
Microalb, Ur Waived: 30 mg/L — ABNORMAL HIGH (ref 0–19)
Microalb/Creat Ratio: <30 mg/g (ref <30)

## 2020-05-31 MED ORDER — ATORVASTATIN CALCIUM 20 MG PO TABS: 20.0000 mg | ORAL_TABLET | Freq: Every day | ORAL | 4 refills | Status: DC

## 2020-05-31 MED ORDER — AMLODIPINE BESYLATE 5 MG PO TABS: 5.0000 mg | ORAL_TABLET | Freq: Every day | ORAL | 4 refills | Status: DC

## 2020-05-31 MED ORDER — LISINOPRIL-HYDROCHLOROTHIAZIDE 20-25 MG PO TABS
1.0000 | ORAL_TABLET | Freq: Every day | ORAL | 4 refills | Status: DC
Start: 2020-05-31 — End: 2020-07-02

## 2020-05-31 MED ORDER — LISINOPRIL 20 MG PO TABS: 20.0000 mg | ORAL_TABLET | Freq: Every day | ORAL | 4 refills | Status: DC

## 2020-05-31 NOTE — Assessment & Plan Note (Addendum)
Chronic, stable with BP at goal.  Continue current medication regimen and adjust as needed.  Refills sent in.  Recommend she monitor BP daily and document for provider + focus on DASH diet.  Labs today TSH, CBC, CMP, urine ALB.  Return in 6 months to meet new provider.

## 2020-05-31 NOTE — Assessment & Plan Note (Signed)
Recent A1C in June 6.5%, similar to prior A1C.  She is focusing on diet and activity.  Recheck A1C today and start medication as needed.  Educated on diabetic diet changes.

## 2020-05-31 NOTE — Assessment & Plan Note (Signed)
Recommended eating smaller high protein, low fat meals more frequently and exercising 30 mins a day 5 times a week with a goal of 10-15lb weight loss in the next 3 months. Patient voiced their understanding and motivation to adhere to these recommendations.  

## 2020-05-31 NOTE — Assessment & Plan Note (Signed)
Chronic, ongoing.  Continue current medication regimen and adjust as needed. Lipid panel today. 

## 2020-05-31 NOTE — Assessment & Plan Note (Signed)
Noted on imaging in 2019.  Continue ASA and Atorvastatin for prevention and recommend healthy diet changes and regular activity. 

## 2020-05-31 NOTE — Assessment & Plan Note (Signed)
Chronic, ongoing.  Continue collaboration with Dr. Pernell Dupre with pain management.

## 2020-05-31 NOTE — Patient Instructions (Signed)
DASH Eating Plan DASH stands for "Dietary Approaches to Stop Hypertension." The DASH eating plan is a healthy eating plan that has been shown to reduce high blood pressure (hypertension). It may also reduce your risk for type 2 diabetes, heart disease, and stroke. The DASH eating plan may also help with weight loss. What are tips for following this plan?  General guidelines  Avoid eating more than 2,300 mg (milligrams) of salt (sodium) a day. If you have hypertension, you may need to reduce your sodium intake to 1,500 mg a day.  Limit alcohol intake to no more than 1 drink a day for nonpregnant women and 2 drinks a day for men. One drink equals 12 oz of beer, 5 oz of wine, or 1 oz of hard liquor.  Work with your health care provider to maintain a healthy body weight or to lose weight. Ask what an ideal weight is for you.  Get at least 30 minutes of exercise that causes your heart to beat faster (aerobic exercise) most days of the week. Activities may include walking, swimming, or biking.  Work with your health care provider or diet and nutrition specialist (dietitian) to adjust your eating plan to your individual calorie needs. Reading food labels   Check food labels for the amount of sodium per serving. Choose foods with less than 5 percent of the Daily Value of sodium. Generally, foods with less than 300 mg of sodium per serving fit into this eating plan.  To find whole grains, look for the word "whole" as the first word in the ingredient list. Shopping  Buy products labeled as "low-sodium" or "no salt added."  Buy fresh foods. Avoid canned foods and premade or frozen meals. Cooking  Avoid adding salt when cooking. Use salt-free seasonings or herbs instead of table salt or sea salt. Check with your health care provider or pharmacist before using salt substitutes.  Do not fry foods. Cook foods using healthy methods such as baking, boiling, grilling, and broiling instead.  Cook with  heart-healthy oils, such as olive, canola, soybean, or sunflower oil. Meal planning  Eat a balanced diet that includes: ? 5 or more servings of fruits and vegetables each day. At each meal, try to fill half of your plate with fruits and vegetables. ? Up to 6-8 servings of whole grains each day. ? Less than 6 oz of lean meat, poultry, or fish each day. A 3-oz serving of meat is about the same size as a deck of cards. One egg equals 1 oz. ? 2 servings of low-fat dairy each day. ? A serving of nuts, seeds, or beans 5 times each week. ? Heart-healthy fats. Healthy fats called Omega-3 fatty acids are found in foods such as flaxseeds and coldwater fish, like sardines, salmon, and mackerel.  Limit how much you eat of the following: ? Canned or prepackaged foods. ? Food that is high in trans fat, such as fried foods. ? Food that is high in saturated fat, such as fatty meat. ? Sweets, desserts, sugary drinks, and other foods with added sugar. ? Full-fat dairy products.  Do not salt foods before eating.  Try to eat at least 2 vegetarian meals each week.  Eat more home-cooked food and less restaurant, buffet, and fast food.  When eating at a restaurant, ask that your food be prepared with less salt or no salt, if possible. What foods are recommended? The items listed may not be a complete list. Talk with your dietitian about   what dietary choices are best for you. Grains Whole-grain or whole-wheat bread. Whole-grain or whole-wheat pasta. Brown rice. Oatmeal. Quinoa. Bulgur. Whole-grain and low-sodium cereals. Pita bread. Low-fat, low-sodium crackers. Whole-wheat flour tortillas. Vegetables Fresh or frozen vegetables (raw, steamed, roasted, or grilled). Low-sodium or reduced-sodium tomato and vegetable juice. Low-sodium or reduced-sodium tomato sauce and tomato paste. Low-sodium or reduced-sodium canned vegetables. Fruits All fresh, dried, or frozen fruit. Canned fruit in natural juice (without  added sugar). Meat and other protein foods Skinless chicken or turkey. Ground chicken or turkey. Pork with fat trimmed off. Fish and seafood. Egg whites. Dried beans, peas, or lentils. Unsalted nuts, nut butters, and seeds. Unsalted canned beans. Lean cuts of beef with fat trimmed off. Low-sodium, lean deli meat. Dairy Low-fat (1%) or fat-free (skim) milk. Fat-free, low-fat, or reduced-fat cheeses. Nonfat, low-sodium ricotta or cottage cheese. Low-fat or nonfat yogurt. Low-fat, low-sodium cheese. Fats and oils Soft margarine without trans fats. Vegetable oil. Low-fat, reduced-fat, or light mayonnaise and salad dressings (reduced-sodium). Canola, safflower, olive, soybean, and sunflower oils. Avocado. Seasoning and other foods Herbs. Spices. Seasoning mixes without salt. Unsalted popcorn and pretzels. Fat-free sweets. What foods are not recommended? The items listed may not be a complete list. Talk with your dietitian about what dietary choices are best for you. Grains Baked goods made with fat, such as croissants, muffins, or some breads. Dry pasta or rice meal packs. Vegetables Creamed or fried vegetables. Vegetables in a cheese sauce. Regular canned vegetables (not low-sodium or reduced-sodium). Regular canned tomato sauce and paste (not low-sodium or reduced-sodium). Regular tomato and vegetable juice (not low-sodium or reduced-sodium). Pickles. Olives. Fruits Canned fruit in a light or heavy syrup. Fried fruit. Fruit in cream or butter sauce. Meat and other protein foods Fatty cuts of meat. Ribs. Fried meat. Bacon. Sausage. Bologna and other processed lunch meats. Salami. Fatback. Hotdogs. Bratwurst. Salted nuts and seeds. Canned beans with added salt. Canned or smoked fish. Whole eggs or egg yolks. Chicken or turkey with skin. Dairy Whole or 2% milk, cream, and half-and-half. Whole or full-fat cream cheese. Whole-fat or sweetened yogurt. Full-fat cheese. Nondairy creamers. Whipped toppings.  Processed cheese and cheese spreads. Fats and oils Butter. Stick margarine. Lard. Shortening. Ghee. Bacon fat. Tropical oils, such as coconut, palm kernel, or palm oil. Seasoning and other foods Salted popcorn and pretzels. Onion salt, garlic salt, seasoned salt, table salt, and sea salt. Worcestershire sauce. Tartar sauce. Barbecue sauce. Teriyaki sauce. Soy sauce, including reduced-sodium. Steak sauce. Canned and packaged gravies. Fish sauce. Oyster sauce. Cocktail sauce. Horseradish that you find on the shelf. Ketchup. Mustard. Meat flavorings and tenderizers. Bouillon cubes. Hot sauce and Tabasco sauce. Premade or packaged marinades. Premade or packaged taco seasonings. Relishes. Regular salad dressings. Where to find more information:  National Heart, Lung, and Blood Institute: www.nhlbi.nih.gov  American Heart Association: www.heart.org Summary  The DASH eating plan is a healthy eating plan that has been shown to reduce high blood pressure (hypertension). It may also reduce your risk for type 2 diabetes, heart disease, and stroke.  With the DASH eating plan, you should limit salt (sodium) intake to 2,300 mg a day. If you have hypertension, you may need to reduce your sodium intake to 1,500 mg a day.  When on the DASH eating plan, aim to eat more fresh fruits and vegetables, whole grains, lean proteins, low-fat dairy, and heart-healthy fats.  Work with your health care provider or diet and nutrition specialist (dietitian) to adjust your eating plan to your   individual calorie needs. This information is not intended to replace advice given to you by your health care provider. Make sure you discuss any questions you have with your health care provider. Document Revised: 05/18/2017 Document Reviewed: 05/29/2016 Elsevier Patient Education  2020 Elsevier Inc.  

## 2020-05-31 NOTE — Progress Notes (Addendum)
BP 129/77    Pulse 97    Temp 98.1 F (36.7 C) (Oral)    Ht 5' 1.02" (1.55 m)    Wt 164 lb 3.2 oz (74.5 kg)    LMP 11/28/1994 (Approximate)    SpO2 98%    BMI 31.00 kg/m    Subjective:    Patient ID: Ellen [name], female    DOB: Jan 02, 1941, 79 y.o.   MRN: [id number]  HPI: Ellen [name] is a 79 y.o. female presenting on 05/31/2020 for comprehensive medical examination. Current medical complaints include:none  She currently lives with: husband Menopausal Symptoms: no   HYPERTENSION / HYPERLIPIDEMIA Continues on Lisinopril-HCTZ, Amlodipine, Atorvastatin.  Sees Dr. Madelaine Bhat at pain clinic and is currently taking Flexeril.  Has history of elevation in A1C, last check this was 6.5% in June.   Satisfied with current treatment? yes Duration of hypertension: chronic BP monitoring frequency: weekly BP range: 120/70 range at home BP medication side effects: no Duration of hyperlipidemia: chronic Cholesterol medication side effects: no Cholesterol supplements: none Medication compliance: good compliance Aspirin: yes Recent stressors: no Recurrent headaches: no Visual changes: no Palpitations: no Dyspnea: no Chest pain: no Lower extremity edema: no Dizzy/lightheaded: no  Depression Screen done today and results listed below:  Depression screen [facility] 2/9 05/31/2020 04/26/2020 01/05/2020 11/04/2019 09/26/2019  Decreased Interest 0 0 0 0 0  Down, Depressed, Hopeless 0 0 0 0 0  PHQ - 2 Score 0 0 0 0 0  Altered sleeping - - - - -  Tired, decreased energy - - - - -  Change in appetite - - - - -  Feeling bad or failure about yourself  - - - - -  Trouble concentrating - - - - -  Moving slowly or fidgety/restless - - - - -  Suicidal thoughts - - - - -  PHQ-9 Score - - - - -  Difficult doing work/chores - - - - -    The patient does not have a history of falls. I did not complete a risk assessment for falls. A plan of care for falls was not documented.   Past Medical History:  Past  Medical History:  Diagnosis Date   Hyperlipidemia    Hypertension    Lumbago    Menopause    Migraines     Surgical History:  Past Surgical History:  Procedure Laterality Date   CHOLECYSTECTOMY  1982   TUBAL LIGATION  1984    Medications:  Current Outpatient Medications on File Prior to Visit  Medication Sig   CALCIUM-MAGNESIUM-ZINC PO Take 1 tablet by mouth daily.   calcium-vitamin D 250-100 MG-UNIT tablet Take 1 tablet by mouth 2 (two) times daily.   Cholecalciferol (VITAMIN D3) 1000 units CAPS Take 2 capsules by mouth daily.    cyclobenzaprine (FLEXERIL) 10 MG tablet Take 10 mg by mouth at bedtime.   Magnesium 500 MG TABS Take 1 tablet by mouth daily.   vitamin C (ASCORBIC ACID) 500 MG tablet Take 500 mg by mouth daily.   aspirin 81 MG tablet Take 81 mg by mouth daily.   lidocaine (LIDODERM) 5 % Place 1 patch onto the skin daily. Remove & Discard patch within 12 hours or as directed by MD (Patient not taking: Reported on 03/29/2020)   No current facility-administered medications on file prior to visit.    Allergies:  No Known Allergies  Social History:  Social History   Socioeconomic History   Marital status: Married  Spouse name: Not on file   Number of children: Not on file   Years of education: some college   Highest education level: Some college, no degree  Occupational History   Occupation: retired  Tobacco Use   Smoking status: Never Smoker   Smokeless tobacco: Never Used  Building services engineerVaping Use   Vaping Use: Never used  Substance and Sexual Activity   Alcohol use: No    Alcohol/week: 0.0 standard drinks   Drug use: No   Sexual activity: Yes  Other Topics Concern   Not on file  Social History Narrative   Not on file   Social Determinants of Health   Financial Resource Strain: Low Risk    Difficulty of Paying Living Expenses: Not hard at all  Food Insecurity: No Food Insecurity   Worried About Programme researcher, broadcasting/film/videounning Out of Food in the Last  Year: Never true   Ran Out of Food in the Last Year: Never true  Transportation Needs: No Transportation Needs   Lack of Transportation (Medical): No   Lack of Transportation (Non-Medical): No  Physical Activity: Sufficiently Active   Days of Exercise per Week: 3 days   Minutes of Exercise per Session: 60 min  Stress: No Stress Concern Present   Feeling of Stress : Not at all  Social Connections: Socially Integrated   Frequency of Communication with Friends and Family: More than three times a week   Frequency of Social Gatherings with Friends and Family: More than three times a week   Attends Religious Services: More than 4 times per year   Active Member of [employer] Financial[employer]ubs or Organizations: Yes   Attends Engineer, structuralClub or Organization Meetings: More than 4 times per year   Marital Status: Married  Catering managerntimate Partner Violence: Not At Risk   Fear of Current or Ex-Partner: No   Emotionally Abused: No   Physically Abused: No   Sexually Abused: No   Social History   Tobacco Use  Smoking Status Never Smoker  Smokeless Tobacco Never Used   Social History   Substance and Sexual Activity  Alcohol Use No   Alcohol/week: 0.0 standard drinks    Family History:  Family History  Problem Relation Age of Onset   Alzheimer's disease Mother    Diabetes Mother    Stroke Mother    Hyperlipidemia Mother    Stroke Father    Hypertension Father    Hyperlipidemia Father    Diabetes Sister    Diabetes Brother    Hypertension Brother    Diabetes Sister     Past medical history, surgical history, medications, allergies, family history and social history reviewed with patient today and changes made to appropriate areas of the chart.   Review of Systems - negative All other ROS negative except what is listed above and in the HPI.      Objective:    BP 129/77    Pulse 97    Temp 98.1 F (36.7 C) (Oral)    Ht 5' 1.02" (1.55 m)    Wt 164 lb 3.2 oz (74.5 kg)    LMP 11/28/1994  (Approximate)    SpO2 98%    BMI 31.00 kg/m   Wt Readings from Last 3 Encounters:  05/31/20 164 lb 3.2 oz (74.5 kg)  04/26/20 164 lb (74.4 kg)  03/29/20 168 lb (76.2 kg)    Physical Exam Vitals and nursing note reviewed.  Constitutional:      General: She is awake. She is not in acute distress.  Appearance: She is well-developed. She is not ill-appearing.  HENT:     Head: Normocephalic and atraumatic.     Right Ear: Hearing, tympanic membrane, ear canal and external ear normal. No drainage.     Left Ear: Hearing, tympanic membrane, ear canal and external ear normal. No drainage.     Nose: Nose normal.     Right Sinus: No maxillary sinus tenderness or frontal sinus tenderness.     Left Sinus: No maxillary sinus tenderness or frontal sinus tenderness.     Mouth/Throat:     Mouth: Mucous membranes are moist.     Pharynx: Oropharynx is clear. Uvula midline. No pharyngeal swelling, oropharyngeal exudate or posterior oropharyngeal erythema.  Eyes:     General: Lids are normal.        Right eye: No discharge.        Left eye: No discharge.     Extraocular Movements: Extraocular movements intact.     Conjunctiva/sclera: Conjunctivae normal.     Pupils: Pupils are equal, round, and reactive to light.     Visual Fields: Right eye visual fields normal and left eye visual fields normal.  Neck:     Thyroid: No thyromegaly.     Vascular: No carotid bruit.     Trachea: Trachea normal.  Cardiovascular:     Rate and Rhythm: Normal rate and regular rhythm.     Heart sounds: Normal heart sounds. No murmur heard. No gallop.   Pulmonary:     Effort: Pulmonary effort is normal. No accessory muscle usage or respiratory distress.     Breath sounds: Normal breath sounds.  Chest:  Breasts:     Right: Normal. No axillary adenopathy or supraclavicular adenopathy.     Left: Normal. No axillary adenopathy or supraclavicular adenopathy.    Abdominal:     General: Bowel sounds are normal.      Palpations: Abdomen is soft. There is no hepatomegaly or splenomegaly.     Tenderness: There is no abdominal tenderness.  Musculoskeletal:        General: Normal range of motion.     Cervical back: Normal range of motion and neck supple.     Right lower leg: No edema.     Left lower leg: No edema.  Lymphadenopathy:     Head:     Right side of head: No submental, submandibular, tonsillar, preauricular or posterior auricular adenopathy.     Left side of head: No submental, submandibular, tonsillar, preauricular or posterior auricular adenopathy.     Cervical: No cervical adenopathy.     Upper Body:     Right upper body: No supraclavicular, axillary or pectoral adenopathy.     Left upper body: No supraclavicular, axillary or pectoral adenopathy.  Skin:    General: Skin is warm and dry.     Capillary Refill: Capillary refill takes less than 2 seconds.     Findings: No rash.  Neurological:     Mental Status: She is alert and oriented to person, place, and time.     Cranial Nerves: Cranial nerves are intact.     Gait: Gait is intact.     Deep Tendon Reflexes: Reflexes are normal and symmetric.     Reflex Scores:      Brachioradialis reflexes are 2+ on the right side and 2+ on the left side.      Patellar reflexes are 2+ on the right side and 2+ on the left side. Psychiatric:  Attention and Perception: Attention normal.        Mood and Affect: Mood normal.        Speech: Speech normal.        Behavior: Behavior normal. Behavior is cooperative.        Thought Content: Thought content normal.        Judgment: Judgment normal.    Results for orders placed or performed in visit on 11/19/19  Comprehensive metabolic panel  Result Value Ref Range   Glucose 164 (H) 65 - 99 mg/dL   BUN 19 8 - 27 mg/dL   Creatinine, Ser 1.96 0.57 - 1.00 mg/dL   GFR calc non Af Amer 67 >59 mL/min/1.73   GFR calc Af Amer 77 >59 mL/min/1.73   BUN/Creatinine Ratio 22 12 - 28   Sodium 141 134 - 144  mmol/L   Potassium 3.6 3.5 - 5.2 mmol/L   Chloride 103 96 - 106 mmol/L   CO2 24 20 - 29 mmol/L   Calcium 9.6 8.7 - 10.3 mg/dL   Total Protein 6.9 6.0 - 8.5 g/dL   Albumin 4.1 3.7 - 4.7 g/dL   Globulin, Total 2.8 1.5 - 4.5 g/dL   Albumin/Globulin Ratio 1.5 1.2 - 2.2   Bilirubin Total <0.2 0.0 - 1.2 mg/dL   Alkaline Phosphatase 90 48 - 121 IU/L   AST 21 0 - 40 IU/L   ALT 17 0 - 32 IU/L  Lipid Panel w/o Chol/HDL Ratio  Result Value Ref Range   Cholesterol, Total 157 100 - 199 mg/dL   Triglycerides 222 (H) 0 - 149 mg/dL   HDL 49 >97 mg/dL   VLDL Cholesterol Cal 35 5 - 40 mg/dL   LDL Chol Calc (NIH) 73 0 - 99 mg/dL  HgB L8X  Result Value Ref Range   Hgb A1c MFr Bld 6.5 (H) 4.8 - 5.6 %   Est. average glucose Bld gHb Est-mCnc 140 mg/dL      Assessment & Plan:   Problem List Items Addressed This Visit      Cardiovascular and Mediastinum   Hypertension    Chronic, stable with BP at goal.  Continue current medication regimen and adjust as needed.  Refills sent in.  Recommend she monitor BP daily and document for provider + focus on DASH diet.  Labs today TSH, CBC, CMP, urine ALB.  Return in 6 months to meet new provider.      Relevant Medications   amLODipine (NORVASC) 5 MG tablet   atorvastatin (LIPITOR) 20 MG tablet   lisinopril-hydrochlorothiazide (ZESTORETIC) 20-25 MG tablet   lisinopril (ZESTRIL) 20 MG tablet   Other Relevant Orders   CBC with Differential/Platelet   Comprehensive metabolic panel   TSH   Microalbumin, Urine Waived   Atherosclerosis of aorta (HCC) - Primary    Noted on imaging in 2019.  Continue ASA and Atorvastatin for prevention and recommend healthy diet changes and regular activity.      Relevant Medications   amLODipine (NORVASC) 5 MG tablet   atorvastatin (LIPITOR) 20 MG tablet   lisinopril-hydrochlorothiazide (ZESTORETIC) 20-25 MG tablet   lisinopril (ZESTRIL) 20 MG tablet     Endocrine   IFG (impaired fasting glucose)    Recent A1C in June  6.5%, similar to prior A1C.  She is focusing on diet and activity.  Recheck A1C today and start medication as needed.  Educated on diabetic diet changes.      Relevant Orders   HgB A1c     Other  Back pain    Chronic, ongoing.  Continue collaboration with Dr. Pernell Dupre with pain management.      Hyperlipidemia    Chronic, ongoing.  Continue current medication regimen and adjust as needed.  Lipid panel today.        Relevant Medications   amLODipine (NORVASC) 5 MG tablet   atorvastatin (LIPITOR) 20 MG tablet   lisinopril-hydrochlorothiazide (ZESTORETIC) 20-25 MG tablet   lisinopril (ZESTRIL) 20 MG tablet   Other Relevant Orders   Lipid Panel w/o Chol/HDL Ratio   Obesity    Recommended eating smaller high protein, low fat meals more frequently and exercising 30 mins a day 5 times a week with a goal of 10-15lb weight loss in the next 3 months. Patient voiced their understanding and motivation to adhere to these recommendations.        Other Visit Diagnoses    Need for hepatitis C screening test       Hep C screening on labs today   Relevant Orders   Hepatitis C antibody   Annual physical exam       Annual labs today to include CBC, CMP, TSH, lipid       Follow up plan: Return in about 6 months (around 11/29/2020) for HTN/HLD, IFG -- to meet new PCP.   LABORATORY TESTING:  - Pap smear: not applicable  IMMUNIZATIONS:   - Tdap: Tetanus vaccination status reviewed: refused. - Influenza: Up to date - Pneumovax: Up to date - Prevnar: Up to date - HPV: Not applicable - Zostavax vaccine: Up to date  SCREENING: -Mammogram: Not applicable -- does not wish to continue - Colonoscopy: Not applicable  - Bone Density: Up to date  -Hearing Test: Not applicable  -Spirometry: Not applicable   PATIENT COUNSELING:   Advised to take 1 mg of folate supplement per day if capable of pregnancy.   Sexuality: Discussed sexually transmitted diseases, partner selection, use of condoms,  avoidance of unintended pregnancy  and contraceptive alternatives.   Advised to avoid cigarette smoking.  I discussed with the patient that most people either abstain from alcohol or drink within safe limits (<=14/week and <=4 drinks/occasion for males, <=7/weeks and <= 3 drinks/occasion for females) and that the risk for alcohol disorders and other health effects rises proportionally with the number of drinks per week and how often a drinker exceeds daily limits.  Discussed cessation/primary prevention of drug use and availability of treatment for abuse.   Diet: Encouraged to adjust caloric intake to maintain  or achieve ideal body weight, to reduce intake of dietary saturated fat and total fat, to limit sodium intake by avoiding high sodium foods and not adding table salt, and to maintain adequate dietary potassium and calcium preferably from fresh fruits, vegetables, and low-fat dairy products.    stressed the importance of regular exercise  Injury prevention: Discussed safety belts, safety helmets, smoke detector, smoking near bedding or upholstery.   Dental health: Discussed importance of regular tooth brushing, flossing, and dental visits.    NEXT PREVENTATIVE PHYSICAL DUE IN 1 YEAR. Return in about 6 months (around 11/29/2020) for HTN/HLD, IFG -- to meet new PCP.

## 2020-06-01 LAB — CBC WITH DIFFERENTIAL/PLATELET
Basophils Absolute: 0.1 10*3/uL (ref 0.0–0.2)
Basos: 2 %
EOS (ABSOLUTE): 0.3 10*3/uL (ref 0.0–0.4)
Eos: 7 %
Hematocrit: 39.5 % (ref 34.0–46.6)
Hemoglobin: 12.6 g/dL (ref 11.1–15.9)
Immature Grans (Abs): 0 10*3/uL (ref 0.0–0.1)
Immature Granulocytes: 0 %
Lymphocytes Absolute: 1.4 10*3/uL (ref 0.7–3.1)
Lymphs: 30 %
MCH: 28.3 pg (ref 26.6–33.0)
MCHC: 31.9 g/dL (ref 31.5–35.7)
MCV: 89 fL (ref 79–97)
Monocytes Absolute: 0.4 10*3/uL (ref 0.1–0.9)
Monocytes: 9 %
Neutrophils Absolute: 2.4 10*3/uL (ref 1.4–7.0)
Neutrophils: 52 %
Platelets: 278 10*3/uL (ref 150–450)
RBC: 4.46 x10E6/uL (ref 3.77–5.28)
RDW: 13.2 % (ref 11.7–15.4)
WBC: 4.6 10*3/uL (ref 3.4–10.8)

## 2020-06-01 LAB — COMPREHENSIVE METABOLIC PANEL
ALT: 22 IU/L (ref 0–32)
AST: 26 IU/L (ref 0–40)
Albumin/Globulin Ratio: 1.8 (ref 1.2–2.2)
Albumin: 4.3 g/dL (ref 3.7–4.7)
Alkaline Phosphatase: 82 IU/L (ref 44–121)
BUN/Creatinine Ratio: 16 (ref 12–28)
BUN: 14 mg/dL (ref 8–27)
Bilirubin Total: 0.2 mg/dL (ref 0.0–1.2)
CO2: 28 mmol/L (ref 20–29)
Calcium: 9.8 mg/dL (ref 8.7–10.3)
Chloride: 102 mmol/L (ref 96–106)
Creatinine, Ser: 0.85 mg/dL (ref 0.57–1.00)
GFR calc Af Amer: 77 mL/min/{1.73_m2} (ref >59)
GFR calc non Af Amer: 67 mL/min/{1.73_m2} (ref >59)
Globulin, Total: 2.4 g/dL (ref 1.5–4.5)
Glucose: 162 mg/dL — ABNORMAL HIGH (ref 65–99)
Potassium: 3.2 mmol/L — ABNORMAL LOW (ref 3.5–5.2)
Sodium: 143 mmol/L (ref 134–144)
Total Protein: 6.7 g/dL (ref 6.0–8.5)

## 2020-06-01 LAB — HEMOGLOBIN A1C
Est. average glucose Bld gHb Est-mCnc: 140 mg/dL
Hgb A1c MFr Bld: 6.5 % — ABNORMAL HIGH (ref 4.8–5.6)

## 2020-06-01 LAB — LIPID PANEL W/O CHOL/HDL RATIO
Cholesterol, Total: 202 mg/dL — ABNORMAL HIGH (ref 100–199)
HDL: 51 mg/dL (ref >39)
LDL Chol Calc (NIH): 113 mg/dL — ABNORMAL HIGH (ref 0–99)
Triglycerides: 221 mg/dL — ABNORMAL HIGH (ref 0–149)
VLDL Cholesterol Cal: 38 mg/dL (ref 5–40)

## 2020-06-01 LAB — TSH: TSH: 1.77 u[IU]/mL (ref 0.450–4.500)

## 2020-06-01 LAB — HEPATITIS C ANTIBODY: Hep C Virus Ab: <0.1 s/co ratio (ref 0.0–0.9)

## 2020-06-01 NOTE — Progress Notes (Signed)
Please let Ms. Smolen know her labs have returned: - A1C remains in diabetic range.  The A1C is the diabetes testing we talked about, this looks at your blood sugars over the past 3 months and turns the average into a number.  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  Any number 6.5 or greater is considered diabetes.   I would recommend we start a low dose of Metformin 500 MG twice a day to prevent this from trending up and worsening, as sugar on labs was quite elevated.  Then I would like to see you in 4 weeks to assess how you are tolerating medication.  If you are okay with starting let me know. - Cholesterol labs were above goal, are you taking Atorvastatin daily.  Please ensure you are and we will recheck fasting labs next visit.   - Potassium level was slightly low, I would like to recheck in 4 weeks if you come in for visit.  Please ensure you are eating potassium rich foods like mangoes and avocados. - Remainder of labs were stable.  Any questions? Keep being awesome!!  Thank you for allowing me to participate in your care. Kindest regards, Haeven Nickle

## 2020-06-02 ENCOUNTER — Other Ambulatory Visit: Payer: Self-pay | Admitting: Nurse Practitioner

## 2020-06-02 ENCOUNTER — Encounter: Payer: Self-pay | Admitting: Nurse Practitioner

## 2020-06-02 ENCOUNTER — Telehealth: Payer: Self-pay

## 2020-06-02 MED ORDER — METFORMIN HCL 500 MG PO TABS: ORAL_TABLET | ORAL | 3 refills | Status: DC

## 2020-06-02 NOTE — Telephone Encounter (Signed)
You are awesome.  Thank you.  Metformin was sent in and sent refills on statin at physical:)

## 2020-06-02 NOTE — Telephone Encounter (Signed)
Patient notified.  Has been out of Cholesterol meds for about a week. 4 week follow up scheduled. Please send in metformin

## 2020-06-08 ENCOUNTER — Telehealth: Payer: Self-pay | Admitting: General Practice

## 2020-06-08 ENCOUNTER — Telehealth: Payer: Self-pay

## 2020-06-08 NOTE — Telephone Encounter (Signed)
  Chronic Care Management   Outreach Note  06/08/2020 Name: Ellen [name] MRN: [id number] DOB: 02/02/1941  Referred by: [name], PA-C Reason for referral : Appointment (RNCM Follow up for Chronic Disease Management and Care Coordination Needs)   Ellen [name] is enrolled in a Managed Medicaid Health Plan: No  An unsuccessful telephone outreach was attempted today. The patient was referred to the case management team for assistance with care management and care coordination.   Follow Up Plan: A HIPAA compliant phone message was left for the patient providing contact information and requesting a return call.   Alto Denver RN, MSN, CCM Community Care Coordinator [place]  [facility] Mobile: [phone]

## 2020-06-24 ENCOUNTER — Other Ambulatory Visit: Payer: Self-pay | Admitting: Anesthesiology

## 2020-07-01 ENCOUNTER — Encounter: Payer: Self-pay | Admitting: Nurse Practitioner

## 2020-07-01 ENCOUNTER — Ambulatory Visit (INDEPENDENT_AMBULATORY_CARE_PROVIDER_SITE_OTHER): Payer: Medicare Other | Admitting: Nurse Practitioner

## 2020-07-01 ENCOUNTER — Other Ambulatory Visit: Payer: Self-pay

## 2020-07-01 VITALS — BP 122/67 | HR 94 | Temp 98.3°F | Ht 61.02 in | Wt 160.0 lb

## 2020-07-01 DIAGNOSIS — E1169 Type 2 diabetes mellitus with other specified complication: Secondary | ICD-10-CM | POA: Diagnosis not present

## 2020-07-01 DIAGNOSIS — I152 Hypertension secondary to endocrine disorders: Secondary | ICD-10-CM

## 2020-07-01 DIAGNOSIS — E538 Deficiency of other specified B group vitamins: Secondary | ICD-10-CM | POA: Diagnosis not present

## 2020-07-01 DIAGNOSIS — E785 Hyperlipidemia, unspecified: Secondary | ICD-10-CM

## 2020-07-01 DIAGNOSIS — I700 Atherosclerosis of aorta: Secondary | ICD-10-CM

## 2020-07-01 DIAGNOSIS — E1129 Type 2 diabetes mellitus with other diabetic kidney complication: Secondary | ICD-10-CM | POA: Diagnosis not present

## 2020-07-01 DIAGNOSIS — R809 Proteinuria, unspecified: Secondary | ICD-10-CM

## 2020-07-01 DIAGNOSIS — E1159 Type 2 diabetes mellitus with other circulatory complications: Secondary | ICD-10-CM

## 2020-07-01 DIAGNOSIS — D519 Vitamin B12 deficiency anemia, unspecified: Secondary | ICD-10-CM | POA: Diagnosis not present

## 2020-07-01 MED ORDER — BLOOD GLUCOSE MONITOR KIT: PACK | 0 refills | Status: AC

## 2020-07-01 NOTE — Assessment & Plan Note (Signed)
Noted on imaging in 2019.  Continue ASA and Atorvastatin for prevention and recommend healthy diet changes and regular activity.

## 2020-07-01 NOTE — Progress Notes (Signed)
BP 122/67   Pulse 94   Temp 98.3 F (36.8 C) (Oral)   Ht 5' 1.02" (1.55 m)   Wt 160 lb (72.6 kg)   LMP 11/28/1994 (Approximate)   SpO2 96%   BMI 30.21 kg/m    Subjective:    Patient ID: Ellen [name], female    DOB: [birth year], 80 y.o.   MRN: [id number]  HPI: Ellen [name] is a 80 y.o. female  Chief Complaint  Patient presents with  . Diabetes    Started Metformin at last visit, having no side effects.   HYPERTENSION / HYPERLIPIDEMIA Continues on Lisinopril-HCTZ, Amlodipine, Atorvastatin.  Did have aortic atherosclerosis noted on a past x-ray in 2019.  Sees Dr. Madelaine Bhat at pain clinic and is currently taking Flexeril. Satisfied with current treatment? yes Duration of hypertension: chronic BP monitoring frequency: weekly BP range: 120/70 range at home BP medication side effects: no Duration of hyperlipidemia: chronic Cholesterol medication side effects: no Cholesterol supplements: none Medication compliance: good compliance Aspirin: yes Recent stressors: no Recurrent headaches: no Visual changes: no Palpitations: no Dyspnea: no Chest pain: no Lower extremity edema: no Dizzy/lightheaded: no  DIABETES A1c remained 6.5% in December and after discussion with patient opted to start Metformin.  No ADR with medication. Hypoglycemic episodes:no Polydipsia/polyuria: no Visual disturbance: no Chest pain: no Paresthesias: no Glucose Monitoring: no  Accucheck frequency: Not Checking  Fasting glucose:  Post prandial:  Evening:  Before meals: Taking Insulin?: no  Long acting insulin:  Short acting insulin: Blood Pressure Monitoring: rarely Retinal Examination: Not up to Date Foot Exam: Up to Date Pneumovax: Up to Date Influenza: Up to Date Aspirin: yes  Relevant past medical, surgical, family and social history reviewed and updated as indicated. Interim medical history since our last visit reviewed. Allergies and medications reviewed and  updated.  Review of Systems  Constitutional: Negative for activity change, appetite change, diaphoresis, fatigue and fever.  Respiratory: Negative for cough, chest tightness and shortness of breath.   Cardiovascular: Negative for chest pain, palpitations and leg swelling.  Gastrointestinal: Negative.   Neurological: Negative.   Psychiatric/Behavioral: Negative.     Per HPI unless specifically indicated above     Objective:    BP 122/67   Pulse 94   Temp 98.3 F (36.8 C) (Oral)   Ht 5' 1.02" (1.55 m)   Wt 160 lb (72.6 kg)   LMP 11/28/1994 (Approximate)   SpO2 96%   BMI 30.21 kg/m   Wt Readings from Last 3 Encounters:  07/01/20 160 lb (72.6 kg)  05/31/20 164 lb 3.2 oz (74.5 kg)  04/26/20 164 lb (74.4 kg)    Physical Exam Vitals and nursing note reviewed.  Constitutional:      General: She is awake. She is not in acute distress.    Appearance: She is well-developed and well-groomed. She is not ill-appearing.  HENT:     Head: Normocephalic.     Right Ear: Hearing normal.     Left Ear: Hearing normal.  Eyes:     General: Lids are normal.        Right eye: No discharge.        Left eye: No discharge.     Conjunctiva/sclera: Conjunctivae normal.     Pupils: Pupils are equal, round, and reactive to light.  Neck:     Thyroid: No thyromegaly.     Vascular: No carotid bruit.  Cardiovascular:     Rate and Rhythm: Normal rate and regular rhythm.  Heart sounds: Normal heart sounds. No murmur heard. No gallop.   Pulmonary:     Effort: Pulmonary effort is normal. No accessory muscle usage or respiratory distress.     Breath sounds: Normal breath sounds.  Abdominal:     General: Bowel sounds are normal.     Palpations: Abdomen is soft.  Musculoskeletal:     Cervical back: Normal range of motion and neck supple.     Right lower leg: No edema.     Left lower leg: No edema.  Lymphadenopathy:     Cervical: No cervical adenopathy.  Skin:    General: Skin is warm and  dry.  Neurological:     Mental Status: She is alert and oriented to person, place, and time.     Deep Tendon Reflexes: Reflexes are normal and symmetric.     Reflex Scores:      Brachioradialis reflexes are 2+ on the right side and 2+ on the left side.      Patellar reflexes are 2+ on the right side and 2+ on the left side. Psychiatric:        Attention and Perception: Attention normal.        Mood and Affect: Mood normal.        Speech: Speech normal.        Behavior: Behavior normal. Behavior is cooperative.        Thought Content: Thought content normal.     Results for orders placed or performed in visit on 05/31/20  CBC with Differential/Platelet  Result Value Ref Range   WBC 4.6 3.4 - 10.8 x10E3/uL   RBC 4.46 3.77 - 5.28 x10E6/uL   Hemoglobin 12.6 11.1 - 15.9 g/dL   Hematocrit 24.0 97.3 - 46.6 %   MCV 89 79 - 97 fL   MCH 28.3 26.6 - 33.0 pg   MCHC 31.9 31.5 - 35.7 g/dL   RDW 53.2 99.2 - 42.6 %   Platelets 278 150 - 450 x10E3/uL   Neutrophils 52 Not Estab. %   Lymphs 30 Not Estab. %   Monocytes 9 Not Estab. %   Eos 7 Not Estab. %   Basos 2 Not Estab. %   Neutrophils Absolute 2.4 1.4 - 7.0 x10E3/uL   Lymphocytes Absolute 1.4 0.7 - 3.1 x10E3/uL   Monocytes Absolute 0.4 0.1 - 0.9 x10E3/uL   EOS (ABSOLUTE) 0.3 0.0 - 0.4 x10E3/uL   Basophils Absolute 0.1 0.0 - 0.2 x10E3/uL   Immature Granulocytes 0 Not Estab. %   Immature Grans (Abs) 0.0 0.0 - 0.1 x10E3/uL  Comprehensive metabolic panel  Result Value Ref Range   Glucose 162 (H) 65 - 99 mg/dL   BUN 14 8 - 27 mg/dL   Creatinine, Ser 8.34 0.57 - 1.00 mg/dL   GFR calc non Af Amer 67 >59 mL/min/1.73   GFR calc Af Amer 77 >59 mL/min/1.73   BUN/Creatinine Ratio 16 12 - 28   Sodium 143 134 - 144 mmol/L   Potassium 3.2 (L) 3.5 - 5.2 mmol/L   Chloride 102 96 - 106 mmol/L   CO2 28 20 - 29 mmol/L   Calcium 9.8 8.7 - 10.3 mg/dL   Total Protein 6.7 6.0 - 8.5 g/dL   Albumin 4.3 3.7 - 4.7 g/dL   Globulin, Total 2.4 1.5 - 4.5  g/dL   Albumin/Globulin Ratio 1.8 1.2 - 2.2   Bilirubin Total 0.2 0.0 - 1.2 mg/dL   Alkaline Phosphatase 82 44 - 121 IU/L   AST 26 0 -  40 IU/L   ALT 22 0 - 32 IU/L  Lipid Panel w/o Chol/HDL Ratio  Result Value Ref Range   Cholesterol, Total 202 (H) 100 - 199 mg/dL   Triglycerides 098 (H) 0 - 149 mg/dL   HDL 51 >11 mg/dL   VLDL Cholesterol Cal 38 5 - 40 mg/dL   LDL Chol Calc (NIH) 914 (H) 0 - 99 mg/dL  TSH  Result Value Ref Range   TSH 1.770 0.450 - 4.500 uIU/mL  HgB A1c  Result Value Ref Range   Hgb A1c MFr Bld 6.5 (H) 4.8 - 5.6 %   Est. average glucose Bld gHb Est-mCnc 140 mg/dL  Hepatitis C antibody  Result Value Ref Range   Hep C Virus Ab <0.1 0.0 - 0.9 s/co ratio  Microalbumin, Urine Waived  Result Value Ref Range   Microalb, Ur Waived 30 (H) 0 - 19 mg/L   Creatinine, Urine Waived 50 10 - 300 mg/dL   Microalb/Creat Ratio <30 <30 mg/g      Assessment & Plan:   Problem List Items Addressed This Visit      Cardiovascular and Mediastinum   Hypertension associated with diabetes (HCC)    Chronic, stable with BP at goal.  Continue current medication regimen and adjust as needed.  Refills sent in.  Recommend she monitor BP daily and document for provider + focus on DASH diet.  Labs today BMP.  Return in 2 months to meet new provider.      Relevant Orders   Basic metabolic panel   Atherosclerosis of aorta (HCC)    Noted on imaging in 2019.  Continue ASA and Atorvastatin for prevention and recommend healthy diet changes and regular activity.        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  Will continue current regimen and plan to recheck A1c in March.  Script for glucometer sent with patient and educated her on checking BS every morning with goal <130.  Continue focus on diet changes at home.  Return in 2 months to meet new PCP and check labs.       Relevant Orders   Basic metabolic panel   Hyperlipidemia associated with type 2 diabetes mellitus (HCC)    Chronic, ongoing.  Recent LDL above goal, recheck today.  Continue current medication at this time and adjust as needed based on labs.  She is fasting.      Relevant Orders   Lipid Panel w/o Chol/HDL Ratio    Other Visit Diagnoses    B12 deficiency       Reports history of low levels, will check today and start supplement as needed.   Relevant Orders   Vitamin B12       Follow up plan: Return in about 2 months (around 08/29/2020) for T2DM, HTN/HLD -- meet new PCP.

## 2020-07-01 NOTE — Assessment & Plan Note (Signed)
Chronic, stable with BP at goal.  Continue current medication regimen and adjust as needed.  Refills sent in.  Recommend she monitor BP daily and document for provider + focus on DASH diet.  Labs today BMP.  Return in 2 months to meet new provider.

## 2020-07-01 NOTE — Patient Instructions (Signed)
Check sugar every morning and goal is less then 130.   Diabetes Mellitus and Nutrition, Adult When you have diabetes, or diabetes mellitus, it is very important to have healthy eating habits because your blood sugar (glucose) levels are greatly affected by what you eat and drink. Eating healthy foods in the right amounts, at about the same times every day, can help you:  Control your blood glucose.  Lower your risk of heart disease.  Improve your blood pressure.  Reach or maintain a healthy weight. What can affect my meal plan? Every person with diabetes is different, and each person has different needs for a meal plan. Your health care provider may recommend that you work with a dietitian to make a meal plan that is best for you. Your meal plan may vary depending on factors such as:  The calories you need.  The medicines you take.  Your weight.  Your blood glucose, blood pressure, and cholesterol levels.  Your activity level.  Other health conditions you have, such as heart or kidney disease. How do carbohydrates affect me? Carbohydrates, also called carbs, affect your blood glucose level more than any other type of food. Eating carbs naturally raises the amount of glucose in your blood. Carb counting is a method for keeping track of how many carbs you eat. Counting carbs is important to keep your blood glucose at a healthy level, especially if you use insulin or take certain oral diabetes medicines. It is important to know how many carbs you can safely have in each meal. This is different for every person. Your dietitian can help you calculate how many carbs you should have at each meal and for each snack. How does alcohol affect me? Alcohol can cause a sudden decrease in blood glucose (hypoglycemia), especially if you use insulin or take certain oral diabetes medicines. Hypoglycemia can be a life-threatening condition. Symptoms of hypoglycemia, such as sleepiness, dizziness, and  confusion, are similar to symptoms of having too much alcohol.  Do not drink alcohol if: ? Your health care provider tells you not to drink. ? You are pregnant, may be pregnant, or are planning to become pregnant.  If you drink alcohol: ? Do not drink on an empty stomach. ? Limit how much you use to:  0-1 drink a day for women.  0-2 drinks a day for men. ? Be aware of how much alcohol is in your drink. In the [place] one drink equals one 12 oz bottle of beer (355 mL), one 5 oz glass of wine (148 mL), or one 1 oz glass of hard liquor (44 mL). ? Keep yourself hydrated with water, diet soda, or unsweetened iced tea.  Keep in mind that regular soda, juice, and other mixers may contain a lot of sugar and must be counted as carbs. What are tips for following this plan? Reading food labels  Start by checking the serving size on the "Nutrition Facts" label of packaged foods and drinks. The amount of calories, carbs, fats, and other nutrients listed on the label is based on one serving of the item. Many items contain more than one serving per package.  Check the total grams (g) of carbs in one serving. You can calculate the number of servings of carbs in one serving by dividing the total carbs by 15. For example, if a food has 30 g of total carbs per serving, it would be equal to 2 servings of carbs.  Check the number of grams (g) of  saturated fats and trans fats in one serving. Choose foods that have a low amount or none of these fats.  Check the number of milligrams (mg) of salt (sodium) in one serving. Most people should limit total sodium intake to less than 2,300 mg per day.  Always check the nutrition information of foods labeled as "low-fat" or "nonfat." These foods may be higher in added sugar or refined carbs and should be avoided.  Talk to your dietitian to identify your daily goals for nutrients listed on the label. Shopping  Avoid buying canned, pre-made, or processed foods. These  foods tend to be high in fat, sodium, and added sugar.  Shop around the outside edge of the grocery store. This is where you will most often find fresh fruits and vegetables, bulk grains, fresh meats, and fresh dairy. Cooking  Use low-heat cooking methods, such as baking, instead of high-heat cooking methods like deep frying.  Cook using healthy oils, such as olive, canola, or sunflower oil.  Avoid cooking with butter, cream, or high-fat meats. Meal planning  Eat meals and snacks regularly, preferably at the same times every day. Avoid going long periods of time without eating.  Eat foods that are high in fiber, such as fresh fruits, vegetables, beans, and whole grains. Talk with your dietitian about how many servings of carbs you can eat at each meal.  Eat 4-6 oz (112-168 g) of lean protein each day, such as lean meat, chicken, fish, eggs, or tofu. One ounce (oz) of lean protein is equal to: ? 1 oz (28 g) of meat, chicken, or fish. ? 1 egg. ?  cup (62 g) of tofu.  Eat some foods each day that contain healthy fats, such as avocado, nuts, seeds, and fish.   What foods should I eat? Fruits Berries. Apples. Oranges. Peaches. Apricots. Plums. Grapes. Mango. Papaya. Pomegranate. Kiwi. Cherries. Vegetables Lettuce. Spinach. Leafy greens, including kale, chard, collard greens, and mustard greens. Beets. Cauliflower. Cabbage. Broccoli. Carrots. Green beans. Tomatoes. Peppers. Onions. Cucumbers. Brussels sprouts. Grains Whole grains, such as whole-wheat or whole-grain bread, crackers, tortillas, cereal, and pasta. Unsweetened oatmeal. Quinoa. Brown or wild rice. Meats and other proteins Seafood. Poultry without skin. Lean cuts of poultry and beef. Tofu. Nuts. Seeds. Dairy Low-fat or fat-free dairy products such as milk, yogurt, and cheese. The items listed above may not be a complete list of foods and beverages you can eat. Contact a dietitian for more information. What foods should I  avoid? Fruits Fruits canned with syrup. Vegetables Canned vegetables. Frozen vegetables with butter or cream sauce. Grains Refined white flour and flour products such as bread, pasta, snack foods, and cereals. Avoid all processed foods. Meats and other proteins Fatty cuts of meat. Poultry with skin. Breaded or fried meats. Processed meat. Avoid saturated fats. Dairy Full-fat yogurt, cheese, or milk. Beverages Sweetened drinks, such as soda or iced tea. The items listed above may not be a complete list of foods and beverages you should avoid. Contact a dietitian for more information. Questions to ask a health care provider  Do I need to meet with a diabetes educator?  Do I need to meet with a dietitian?  What number can I call if I have questions?  When are the best times to check my blood glucose? Where to find more information:  American Diabetes Association: diabetes.org  Academy of Nutrition and Dietetics: www.eatright.[employer] of Diabetes and Digestive and Kidney Diseases: CarFlippers.tn  Association of Diabetes Care and  Education Specialists: www.diabeteseducator.org Summary  It is important to have healthy eating habits because your blood sugar (glucose) levels are greatly affected by what you eat and drink.  A healthy meal plan will help you control your blood glucose and maintain a healthy lifestyle.  Your health care provider may recommend that you work with a dietitian to make a meal plan that is best for you.  Keep in mind that carbohydrates (carbs) and alcohol have immediate effects on your blood glucose levels. It is important to count carbs and to use alcohol carefully. This information is not intended to replace advice given to you by your health care provider. Make sure you discuss any questions you have with your health care provider. Document Revised: 05/13/2019 Document Reviewed: 05/13/2019 Elsevier Patient Education  2021 [employer].

## 2020-07-01 NOTE — Assessment & Plan Note (Signed)
New diagnosis with A1c at 6.5%, started on Metformin December 2021 and tolerating well.  Will continue current regimen and plan to recheck A1c in March.  Script for glucometer sent with patient and educated her on checking BS every morning with goal <130.  Continue focus on diet changes at home.  Return in 2 months to meet new PCP and check labs.

## 2020-07-01 NOTE — Assessment & Plan Note (Signed)
Chronic, ongoing.  Recent LDL above goal, recheck today.  Continue current medication at this time and adjust as needed based on labs.  She is fasting.

## 2020-07-02 ENCOUNTER — Ambulatory Visit: Payer: Self-pay | Admitting: Nurse Practitioner

## 2020-07-02 ENCOUNTER — Other Ambulatory Visit: Payer: Self-pay | Admitting: Nurse Practitioner

## 2020-07-02 DIAGNOSIS — E876 Hypokalemia: Secondary | ICD-10-CM

## 2020-07-02 LAB — LIPID PANEL W/O CHOL/HDL RATIO
Cholesterol, Total: 140 mg/dL (ref 100–199)
HDL: 47 mg/dL (ref >39)
LDL Chol Calc (NIH): 73 mg/dL (ref 0–99)
Triglycerides: 113 mg/dL (ref 0–149)
VLDL Cholesterol Cal: 20 mg/dL (ref 5–40)

## 2020-07-02 LAB — BASIC METABOLIC PANEL
BUN/Creatinine Ratio: 18 (ref 12–28)
BUN: 15 mg/dL (ref 8–27)
CO2: 27 mmol/L (ref 20–29)
Calcium: 9.9 mg/dL (ref 8.7–10.3)
Chloride: 98 mmol/L (ref 96–106)
Creatinine, Ser: 0.83 mg/dL (ref 0.57–1.00)
GFR calc Af Amer: 79 mL/min/{1.73_m2} (ref >59)
GFR calc non Af Amer: 68 mL/min/{1.73_m2} (ref >59)
Glucose: 96 mg/dL (ref 65–99)
Potassium: 3.3 mmol/L — ABNORMAL LOW (ref 3.5–5.2)
Sodium: 142 mmol/L (ref 134–144)

## 2020-07-02 LAB — VITAMIN B12: Vitamin B-12: 1173 pg/mL (ref 232–1245)

## 2020-07-02 MED ORDER — LISINOPRIL 40 MG PO TABS: 40.0000 mg | ORAL_TABLET | Freq: Every day | ORAL | 3 refills | Status: DC

## 2020-07-02 NOTE — Progress Notes (Signed)
Also ensure this is taking Lisinopril 40 MG only tablet I send in -- do not take the separate Lisinopril 20 MG tablet she already has.  Thank you.

## 2020-07-02 NOTE — Telephone Encounter (Signed)
Patient has been rescheduled.

## 2020-07-02 NOTE — Progress Notes (Signed)
Good morning, please let Ellen [name] know her labs have returned.  Her potassium is a little on low side, which it has been on past labs.  Since her blood pressure is stable I would like to stop her Lisinopril-Hydrochlorothiazide blood pressure medication and go to Lisinopril only.  I will send this change in and want her to stop Lisinopril-HCTZ and change to Lisinopril only, that I am sending in.  HCTZ can lower potassium levels.  I would like to see her back in 4 weeks for lab visit only to recheck potassium.  Please schedule labs only.  Cholesterol and Vitamin B12 levels are normal.  If any questions please let me know.   Keep being awesome!!  Thank you for allowing me to participate in your care. Kindest regards, Jones Viviani

## 2020-08-02 ENCOUNTER — Other Ambulatory Visit: Payer: Medicare Other

## 2020-08-02 ENCOUNTER — Other Ambulatory Visit: Payer: Self-pay

## 2020-08-02 DIAGNOSIS — E876 Hypokalemia: Secondary | ICD-10-CM

## 2020-08-03 LAB — POTASSIUM: Potassium: 3.5 mmol/L (ref 3.5–5.2)

## 2020-08-03 NOTE — Progress Notes (Signed)
Please let Ms. Gregson know her potassium level has improved, no further intervention needed at this time.  Continue diet focus.  Have a great day!! Keep being awesome!!  Thank you for allowing me to participate in your care. Kindest regards, Liliahna Cudd

## 2020-08-13 ENCOUNTER — Telehealth: Payer: Self-pay | Admitting: General Practice

## 2020-08-13 ENCOUNTER — Telehealth: Payer: Medicare Other

## 2020-08-13 NOTE — Telephone Encounter (Signed)
  Chronic Care Management   Outreach Note  08/13/2020 Name: Ellen [name] MRN: [id number] DOB: 07/01/1940  Referred by: Larae Grooms, NP Reason for referral : Appointment (RNCM: Follow up call/2nd attempt for Chronic Disease Management and Care Coordination Needs)   A second unsuccessful telephone outreach was attempted today. The patient was referred to the case management team for assistance with care management and care coordination.   Follow Up Plan: The care management team will reach out to the patient again over the next 30 to 60 days.   Alto Denver RN, MSN, CCM Community Care Coordinator [place]  [facility] Mobile: [phone]

## 2020-08-24 NOTE — Telephone Encounter (Signed)
Please reschedule RN CM [facility]FP

## 2020-10-13 ENCOUNTER — Telehealth: Payer: Medicare Other

## 2020-10-13 ENCOUNTER — Telehealth: Payer: Self-pay | Admitting: General Practice

## 2020-10-13 NOTE — Telephone Encounter (Signed)
  Chronic Care Management   Outreach Note  10/13/2020 Name: Ellen [name] MRN: [id number] DOB: 1940-11-04  Referred by: Larae Grooms, NP Reason for referral : Appointment (RNCM: Follow up attempt - 2 for Chronic Disease Management and Care Coordination Needs )   A second unsuccessful telephone outreach was attempted today. The patient was referred to the case management team for assistance with care management and care coordination.   Follow Up Plan: The care management team will reach out to the patient again over the next 30 days.   Alto Denver RN, MSN, CCM Community Care Coordinator [place]  [facility] Mobile: [phone]

## 2020-10-15 NOTE — Telephone Encounter (Signed)
Patient has been rescheduled.

## 2020-11-17 ENCOUNTER — Ambulatory Visit (INDEPENDENT_AMBULATORY_CARE_PROVIDER_SITE_OTHER): Payer: Medicare Other | Admitting: General Practice

## 2020-11-17 ENCOUNTER — Telehealth: Payer: Medicare Other | Admitting: General Practice

## 2020-11-17 DIAGNOSIS — E1129 Type 2 diabetes mellitus with other diabetic kidney complication: Secondary | ICD-10-CM | POA: Diagnosis not present

## 2020-11-17 DIAGNOSIS — E782 Mixed hyperlipidemia: Secondary | ICD-10-CM | POA: Diagnosis not present

## 2020-11-17 DIAGNOSIS — E1159 Type 2 diabetes mellitus with other circulatory complications: Secondary | ICD-10-CM | POA: Diagnosis not present

## 2020-11-17 DIAGNOSIS — R809 Proteinuria, unspecified: Secondary | ICD-10-CM

## 2020-11-17 DIAGNOSIS — I152 Hypertension secondary to endocrine disorders: Secondary | ICD-10-CM

## 2020-11-17 NOTE — Patient Instructions (Signed)
Visit Information  PATIENT GOALS: Goals Addressed            This Visit's Progress   . COMPLETED: RNCM: Chronic Conditions: HTN/HLD       CARE PLAN ENTRY (see longtitudinal plan of care for additional care plan information)  Current Barriers: Closing this goal and opening in new ELS . Chronic Disease Management support, education, and care coordination needs related to HTN and HLD  Clinical Goal(s) related to HTN and HLD:  Over the next 120 days, patient will:  . Work with the care management team to address educational, disease management, and care coordination needs  . Begin or continue self health monitoring activities as directed today Measure and record blood pressure 2 times per week and adhere to heart healthy diet . Call provider office for new or worsened signs and symptoms Blood pressure findings outside established parameters and New or worsened symptom related to HTN or HLD . Call care management team with questions or concerns . Verbalize basic understanding of patient centered plan of care established today  Interventions related to HTN and HLD:  . Evaluation of current treatment plans and patient's adherence to plan as established by provider.  The patient endorses compliance with medications and treatment regimen. 04-06-2020: The patient is doing well and denies any issues with her HTN and HLD.  Saw pcp in September.   . Evaluation of medications. Patient verbalized compliance with medication regimen.  . Assessed patient understanding of disease states.  The patient verbalized understanding of her chronic conditions. 04-06-2020: the patient states that her blood pressure is stable.  o BP Readings from Last 3 Encounters: o  03/29/20 o (!) 142/84 o  03/05/20 o 131/78 o  01/05/20 o 136/87 o    . Assessed patient's education and care coordination needs.  The patient verbalized that her daughter is a Marine scientist and she comes and checks her blood pressures. Encouraged the  patient to write down her blood pressure readings and have them on hand so she could let the [facility] know on outreach calls. The patient will start doing this. 01-20-2020: The patient is writing down her numbers and her daughter is still checking her blood pressure. Readings are WNL.  Praised the patient for regular checks and will continue to monitor for changes. 04-06-2020: the patient states her blood pressures have been in 130 to 106 range for systolic and 26'R to 48'N in diastolic range. She denies any issues with headaches or elevated blood pressures.  . Provided disease specific education to patient.  Education on heart healthy diet and sodium restrictions. The patient is enjoying fresh fruits and vegetables.  The patient denies any issues with dietary restrictions. 04-06-2020:  The patient states that she is eating all kinds of fresh fruits and vegetables. The patient denies any issues with following heart healthy diet. The patient denies any SDOH related to food needs.  . Evaluation of activity level. The patient verbalized that she is enjoying getting out and being outside due to the prettier weather. 04-06-2020: the patient denies any issues with activity restrictions even with her neck now. She is able to do her normal activities without difficulty. The patient is happy.   Nash Dimmer with appropriate clinical care team members regarding patient needs. Denies needs for CCM team pharmacist and LCSW but knows they are available to help as needed with concerns.   Patient Self Care Activities related to HTN and HLD:  . Patient is unable to independently self-manage chronic health  conditions  Please see past updates related to this goal by clicking on the "Past Updates" button in the selected goal        Patient Care Plan: RNCM: Diabetes Type 2 (Adult)    Problem Identified: RNCM: Glycemic Management (Diabetes, Type 2)   Priority: Medium    Long-Range Goal: RNCM: Glycemic Management Optimized    Priority: Medium  Note:   Objective:  Lab Results  Component Value Date   HGBA1C 6.5 (H) 05/31/2020 .   Lab Results  Component Value Date   CREATININE 0.83 07/01/2020   CREATININE 0.85 05/31/2020   CREATININE 0.85 11/19/2019 .   Marland Kitchen No results found for: EGFR Current Barriers:  Marland Kitchen Knowledge Deficits related to basic Diabetes pathophysiology and self care/management . Knowledge Deficits related to medications used for management of diabetes . Limited Social Support . Lacks social connections . Does not maintain contact with provider office Case Manager Clinical Goal(s):  . patient will demonstrate improved adherence to prescribed treatment plan for diabetes self care/management as evidenced by: daily monitoring and recording of CBG  adherence to ADA/ carb modified diet adherence to prescribed medication regimen contacting provider for new or worsened symptoms or questions Interventions:  . Collaboration with Jon Billings, NP regarding development and update of comprehensive plan of care as evidenced by provider attestation and co-signature . Inter-disciplinary care team collaboration (see longitudinal plan of care) . Provided education to patient about basic DM disease process . Reviewed medications with patient and discussed importance of medication adherence . Discussed plans with patient for ongoing care management follow up and provided patient with direct contact information for care management team . Provided patient with written educational materials related to hypo and hyperglycemia and importance of correct treatment. States blood sugar range has been 105 to 129. Denies any lows . Advised patient, providing education and rationale, to check cbg weekly and record, calling pcp for findings outside established parameters.   . Review of patient status, including review of consultants reports, relevant laboratory and other test results, and medications completed. Self-Care  Activities - Self administers oral medications as prescribed Attends all scheduled provider appointments Checks blood sugars as prescribed and utilize hyper and hypoglycemia protocol as needed Adheres to prescribed ADA/carb modified Patient Goals: - check blood sugar at prescribed times - check blood sugar before and after exercise - check blood sugar if I feel it is too high or too low - enter blood sugar readings and medication or insulin into daily log - take the blood sugar log to all doctor visits - change to whole grain breads, cereal, pasta - set goal weight - drink 6 to 8 glasses of water each day - eat fish at least once per week - limit fast food meals to no more than 1 per week - manage portion size - prepare main meal at home 3 to 5 days each week - read food labels for fat, fiber, carbohydrates and portion size - reduce red meat to 2 to 3 times a week - schedule appointment with eye doctor - check feet daily for cuts, sores or redness - keep feet up while sitting - trim toenails straight across - wash and dry feet carefully every day - wear comfortable, cotton socks - wear comfortable, well-fitting shoes - barriers to adherence to treatment plan identified - blood glucose monitoring encouraged - blood glucose readings reviewed - self-awareness of signs/symptoms of hypo or hyperglycemia encouraged - use of blood glucose monitoring log promoted Follow Up  Plan: Telephone follow up appointment with care management team member scheduled for: 01-19-2021 at 1 pm    Task: RNCM: Alleviate Barriers to Glycemic Management   Note:   Care Management Activities:    - barriers to adherence to treatment plan identified - blood glucose monitoring encouraged - blood glucose readings reviewed - self-awareness of signs/symptoms of hypo or hyperglycemia encouraged - use of blood glucose monitoring log promoted       Patient Care Plan: RNCM: Hypertension (Adult)    Problem  Identified: RNCM: Hypertension (Hypertension)   Priority: Medium    Long-Range Goal: RNCM: Hypertension Monitored   Priority: Medium  Note:   Objective:  . Last practice recorded BP readings:  BP Readings from Last 3 Encounters:  07/01/20 122/67  05/31/20 129/77  03/29/20 (!) 142/84 .   Marland Kitchen Most recent eGFR/CrCl: No results found for: EGFR  No components found for: CRCL Current Barriers:  Marland Kitchen Knowledge Deficits related to basic understanding of hypertension pathophysiology and self care management . Knowledge Deficits related to understanding of medications prescribed for management of hypertension . Limited Social Support . Lacks social connections . Does not maintain contact with provider office Case Manager Clinical Goal(s):  . patient will verbalize understanding of plan for hypertension management . patient will demonstrate improved adherence to prescribed treatment plan for hypertension as evidenced by taking all medications as prescribed, monitoring and recording blood pressure as directed, adhering to low sodium/DASH diet . patient will demonstrate improved health management independence as evidenced by checking blood pressure as directed and notifying PCP if SBP>150 or DBP > 90, taking all medications as prescribe, and adhering to a low sodium diet as discussed. . patient will verbalize basic understanding of hypertension disease process and self health management plan as evidenced by compliance with medications, compliance with heart heatlhy/Ada diet, and working with the CCM team to effectively manage health and well being.  Interventions:  . Collaboration with Jon Billings, NP regarding development and update of comprehensive plan of care as evidenced by provider attestation and co-signature . Inter-disciplinary care team collaboration (see longitudinal plan of care) . Evaluation of current treatment plan related to hypertension self management and patient's adherence to plan  as established by provider. . Provided education to patient re: stroke prevention, s/s of heart attack and stroke, DASH diet, complications of uncontrolled blood pressure . Reviewed medications with patient and discussed importance of compliance . Discussed plans with patient for ongoing care management follow up and provided patient with direct contact information for care management team . Advised patient, providing education and rationale, to monitor blood pressure daily and record, calling PCP for findings outside established parameters.  Self-Care Activities: - Self administers medications as prescribed Attends all scheduled provider appointments Calls provider office for new concerns, questions, or BP outside discussed parameters Checks BP and records as discussed Follows a low sodium diet/DASH diet Patient Goals: - check blood pressure weekly - choose a place to take my blood pressure (home, clinic or office, retail store) - write blood pressure results in a log or diary - agree on reward when goals are met - agree to work together to make changes - ask questions to understand - have a family meeting to talk about healthy habits - learn about high blood pressure - blood pressure trends reviewed - depression screen reviewed - home or ambulatory blood pressure monitoring encouraged Follow Up Plan: Telephone follow up appointment with care management team member scheduled for:01-19-2021 at 1 pm  Task: RNCM: Identify and Monitor Blood Pressure Elevation   Note:   Care Management Activities:    - blood pressure trends reviewed - depression screen reviewed - home or ambulatory blood pressure monitoring encouraged       Patient Care Plan: RNCM: HLD Management    Problem Identified: RNCM: Management of HLD   Priority: Medium    Long-Range Goal: RNCM: Management of HLD   Priority: Medium  Note:   Current Barriers:  . Poorly controlled hyperlipidemia, complicated by HTN,  DM . Current antihyperlipidemic regimen: Atorvastatin 20 mg QD . Most recent lipid panel:     Component Value Date/Time   CHOL 140 07/01/2020 0830   CHOL 172 10/02/2016 0852   TRIG 113 07/01/2020 0830   TRIG 136 10/02/2016 0852   HDL 47 07/01/2020 0830   VLDL 27 10/02/2016 0852   LDLCALC 73 07/01/2020 0830 .   Marland Kitchen ASCVD risk enhancing conditions: age >37, DM, HTN . Unable to independently HLD . Does not maintain contact with provider office RN Care Manager Clinical Goal(s):  . patient will work with Consulting civil engineer, providers, and care team towards execution of optimized self-health management plan . patient will verbalize understanding of plan for effective management of HLD . patient will work with [facility] and pcp to address needs related to effective management of HLD  Interventions: . Collaboration with Jon Billings, NP regarding development and update of comprehensive plan of care as evidenced by provider attestation and co-signature . Inter-disciplinary care team collaboration (see longitudinal plan of care) . Medication review performed; medication list updated in electronic medical record.  Bertram Savin care team collaboration (see longitudinal plan of care) . Referred to pharmacy team for assistance with HLD medication management . Evaluation of current treatment plan related to HLD and patient's adherence to plan as established by provider. . Advised patient to call the office for changes or questions  . Provided education to patient re: HLD management, Heart healthy/ADA diet and working with the [facility] to effectively manage health and well being.  . Discussed plans with patient for ongoing care management follow up and provided patient with direct contact information for care management team Patient Goals/Self-Care Activities: - call for medicine refill 2 or 3 days before it runs out - call if I am sick and can't take my medicine - keep a list of all the medicines I  take; vitamins and herbals too - learn to read medicine labels - use a pillbox to sort medicine - use an alarm clock or phone to remind me to take my medicine - change to whole grain breads, cereal, pasta - drink 6 to 8 glasses of water each day - eat 3 to 5 servings of fruits and vegetables each day - eat 5 or 6 small meals each day - eat fish at least once per week - fill half the plate with nonstarchy vegetables - limit fast food meals to no more than 1 per week - prepare main meal at home 3 to 5 days each week - read food labels for fat, fiber, carbohydrates and portion size - reduce red meat to 2 to 3 times a week - be open to making changes - I can manage, know and watch for signs of a heart attack - if I have chest pain, call for help - learn about small changes that will make a big difference - learn my personal risk factors - barriers to meeting goals identified - change-talk evoked - choices  provided - collaboration with team encouraged - decision-making supported - health risks reviewed - problem-solving facilitated - questions answered - readiness for change evaluated - reassurance provided - self-reflection promoted - self-reliance encouraged  Follow Up Plan: Telephone follow up appointment with care management team member scheduled for: 01-19-2021 at 1 pm     Task: RNCM: Mutually Develop and Royce Macadamia Achievement of Patient Goals   Note:   Care Management Activities:    - barriers to meeting goals identified - change-talk evoked - choices provided - collaboration with team encouraged - decision-making supported - health risks reviewed - problem-solving facilitated - questions answered - readiness for change evaluated - reassurance provided - self-reflection promoted - self-reliance encouraged        The patient verbalized understanding of instructions, educational materials, and care plan provided today and declined offer to receive copy of patient  instructions, educational materials, and care plan.   Telephone follow up appointment with care management team member scheduled for: 01-19-2021 at 1 pm  Noreene Larsson RN, MSN, [facility] Mobile: [phone]

## 2020-11-17 NOTE — Chronic Care Management (AMB) (Signed)
Chronic Care Management   CCM RN Visit Note  11/17/2020 Name: Ellen [name] MRN: [id number] DOB: 1941-02-13  Subjective: Ellen [name] is a 80 y.o. year old female who is a primary care patient of Jon Billings, NP. The care management team was consulted for assistance with disease management and care coordination needs.    Engaged with patient by telephone for follow up visit in response to provider referral for case management and/or care coordination services.   Consent to Services:  The patient was given information about Chronic Care Management services, agreed to services, and gave verbal consent prior to initiation of services.  Please see initial visit note for detailed documentation.   Patient agreed to services and verbal consent obtained.   Assessment: Review of patient past medical history, allergies, medications, health status, including review of consultants reports, laboratory and other test data, was performed as part of comprehensive evaluation and provision of chronic care management services.   SDOH (Social Determinants of Health) assessments and interventions performed:    CCM Care Plan  No Known Allergies  Outpatient Encounter Medications as of 11/17/2020  Medication Sig  . amLODipine (NORVASC) 5 MG tablet Take 1 tablet (5 mg total) by mouth daily.  Marland Kitchen aspirin 81 MG tablet Take 81 mg by mouth daily.  Marland Kitchen atorvastatin (LIPITOR) 20 MG tablet Take 1 tablet (20 mg total) by mouth daily.  . blood glucose meter kit and supplies KIT Dispense based on patient and insurance preference. Use up to four times daily as directed. (FOR ICD-9 250.00, 250.01).  . CALCIUM-MAGNESIUM-ZINC PO Take 1 tablet by mouth daily.  . calcium-vitamin D 250-100 MG-UNIT tablet Take 1 tablet by mouth 2 (two) times daily.  . Cholecalciferol (VITAMIN D3) 1000 units CAPS Take 2 capsules by mouth daily.   . cyclobenzaprine (FLEXERIL) 10 MG tablet Take 10 mg by mouth at bedtime.  . lidocaine  (LIDODERM) 5 % Place 1 patch onto the skin daily. Remove & Discard patch within 12 hours or as directed by MD (Patient not taking: Reported on 03/29/2020)  . lisinopril (ZESTRIL) 40 MG tablet Take 1 tablet (40 mg total) by mouth daily.  . Magnesium 500 MG TABS Take 1 tablet by mouth daily.  . metFORMIN (GLUCOPHAGE) 500 MG tablet Start out taking 1 tablet (500 MG) by mouth every morning with meal, then on week two start taking 1 tablet (500 MG) by mouth in morning with meal and 1 tablet (500 MG) in evening with meal.  . vitamin C (ASCORBIC ACID) 500 MG tablet Take 500 mg by mouth daily.   No facility-administered encounter medications on file as of 11/17/2020.    Patient Active Problem List   Diagnosis Date Noted  . Obesity 05/31/2020  . Cervicalgia 12/03/2019  . Hyperlipidemia associated with type 2 diabetes mellitus ([place]) 11/08/2018  . Atherosclerosis of aorta ([place]) 11/13/2017  . Back pain 06/13/2017  . Muscle soreness 05/16/2017  . Advanced care planning/counseling discussion 04/17/2017  . Hypertension associated with diabetes ([place]) 10/04/2015  . Menopause 03/29/2015  . Type 2 diabetes mellitus with proteinuria ([place]) 03/29/2015    Conditions to be addressed/monitored:HTN, HLD and DMII  Care Plan : RNCM: Diabetes Type 2 (Adult)  Updates made by Vanita Ingles since 11/17/2020 12:00 AM    Problem: RNCM: Glycemic Management (Diabetes, Type 2)   Priority: Medium    Long-Range Goal: RNCM: Glycemic Management Optimized   Priority: Medium  Note:   Objective:  Lab Results  Component Value Date  HGBA1C 6.5 (H) 05/31/2020 .   Lab Results  Component Value Date   CREATININE 0.83 07/01/2020   CREATININE 0.85 05/31/2020   CREATININE 0.85 11/19/2019 .   Marland Kitchen No results found for: EGFR Current Barriers:  Marland Kitchen Knowledge Deficits related to basic Diabetes pathophysiology and self care/management . Knowledge Deficits related to medications used for management of diabetes . Limited Social  Support . Lacks social connections . Does not maintain contact with provider office Case Manager Clinical Goal(s):  . patient will demonstrate improved adherence to prescribed treatment plan for diabetes self care/management as evidenced by: daily monitoring and recording of CBG  adherence to ADA/ carb modified diet adherence to prescribed medication regimen contacting provider for new or worsened symptoms or questions Interventions:  . Collaboration with Jon Billings, NP regarding development and update of comprehensive plan of care as evidenced by provider attestation and co-signature . Inter-disciplinary care team collaboration (see longitudinal plan of care) . Provided education to patient about basic DM disease process . Reviewed medications with patient and discussed importance of medication adherence . Discussed plans with patient for ongoing care management follow up and provided patient with direct contact information for care management team . Provided patient with written educational materials related to hypo and hyperglycemia and importance of correct treatment. States blood sugar range has been 105 to 129. Denies any lows . Advised patient, providing education and rationale, to check cbg weekly and record, calling pcp for findings outside established parameters.   . Review of patient status, including review of consultants reports, relevant laboratory and other test results, and medications completed. Self-Care Activities - Self administers oral medications as prescribed Attends all scheduled provider appointments Checks blood sugars as prescribed and utilize hyper and hypoglycemia protocol as needed Adheres to prescribed ADA/carb modified Patient Goals: - check blood sugar at prescribed times - check blood sugar before and after exercise - check blood sugar if I feel it is too high or too low - enter blood sugar readings and medication or insulin into daily log - take the  blood sugar log to all doctor visits - change to whole grain breads, cereal, pasta - set goal weight - drink 6 to 8 glasses of water each day - eat fish at least once per week - limit fast food meals to no more than 1 per week - manage portion size - prepare main meal at home 3 to 5 days each week - read food labels for fat, fiber, carbohydrates and portion size - reduce red meat to 2 to 3 times a week - schedule appointment with eye doctor - check feet daily for cuts, sores or redness - keep feet up while sitting - trim toenails straight across - wash and dry feet carefully every day - wear comfortable, cotton socks - wear comfortable, well-fitting shoes - barriers to adherence to treatment plan identified - blood glucose monitoring encouraged - blood glucose readings reviewed - self-awareness of signs/symptoms of hypo or hyperglycemia encouraged - use of blood glucose monitoring log promoted Follow Up Plan: Telephone follow up appointment with care management team member scheduled for: 01-19-2021 at 1 pm    Task: RNCM: Alleviate Barriers to Glycemic Management   Note:   Care Management Activities:    - barriers to adherence to treatment plan identified - blood glucose monitoring encouraged - blood glucose readings reviewed - self-awareness of signs/symptoms of hypo or hyperglycemia encouraged - use of blood glucose monitoring log promoted  Care Plan : RNCM: Hypertension (Adult)  Updates made by Vanita Ingles since 11/17/2020 12:00 AM    Problem: RNCM: Hypertension (Hypertension)   Priority: Medium    Long-Range Goal: RNCM: Hypertension Monitored   Priority: Medium  Note:   Objective:  . Last practice recorded BP readings:  BP Readings from Last 3 Encounters:  07/01/20 122/67  05/31/20 129/77  03/29/20 (!) 142/84 .   Marland Kitchen Most recent eGFR/CrCl: No results found for: EGFR  No components found for: CRCL Current Barriers:  Marland Kitchen Knowledge Deficits related to basic  understanding of hypertension pathophysiology and self care management . Knowledge Deficits related to understanding of medications prescribed for management of hypertension . Limited Social Support . Lacks social connections . Does not maintain contact with provider office Case Manager Clinical Goal(s):  . patient will verbalize understanding of plan for hypertension management . patient will demonstrate improved adherence to prescribed treatment plan for hypertension as evidenced by taking all medications as prescribed, monitoring and recording blood pressure as directed, adhering to low sodium/DASH diet . patient will demonstrate improved health management independence as evidenced by checking blood pressure as directed and notifying PCP if SBP>150 or DBP > 90, taking all medications as prescribe, and adhering to a low sodium diet as discussed. . patient will verbalize basic understanding of hypertension disease process and self health management plan as evidenced by compliance with medications, compliance with heart heatlhy/Ada diet, and working with the CCM team to effectively manage health and well being.  Interventions:  . Collaboration with Jon Billings, NP regarding development and update of comprehensive plan of care as evidenced by provider attestation and co-signature . Inter-disciplinary care team collaboration (see longitudinal plan of care) . Evaluation of current treatment plan related to hypertension self management and patient's adherence to plan as established by provider. . Provided education to patient re: stroke prevention, s/s of heart attack and stroke, DASH diet, complications of uncontrolled blood pressure . Reviewed medications with patient and discussed importance of compliance . Discussed plans with patient for ongoing care management follow up and provided patient with direct contact information for care management team . Advised patient, providing education and  rationale, to monitor blood pressure daily and record, calling PCP for findings outside established parameters.  Self-Care Activities: - Self administers medications as prescribed Attends all scheduled provider appointments Calls provider office for new concerns, questions, or BP outside discussed parameters Checks BP and records as discussed Follows a low sodium diet/DASH diet Patient Goals: - check blood pressure weekly - choose a place to take my blood pressure (home, clinic or office, retail store) - write blood pressure results in a log or diary - agree on reward when goals are met - agree to work together to make changes - ask questions to understand - have a family meeting to talk about healthy habits - learn about high blood pressure - blood pressure trends reviewed - depression screen reviewed - home or ambulatory blood pressure monitoring encouraged Follow Up Plan: Telephone follow up appointment with care management team member scheduled for:01-19-2021 at 1 pm    Task: RNCM: Identify and Monitor Blood Pressure Elevation   Note:   Care Management Activities:    - blood pressure trends reviewed - depression screen reviewed - home or ambulatory blood pressure monitoring encouraged       Care Plan : RNCM: HLD Management  Updates made by Vanita Ingles since 11/17/2020 12:00 AM    Problem: RNCM: Management of HLD  Priority: Medium    Long-Range Goal: RNCM: Management of HLD   Priority: Medium  Note:   Current Barriers:  . Poorly controlled hyperlipidemia, complicated by HTN, DM . Current antihyperlipidemic regimen: Atorvastatin 20 mg QD . Most recent lipid panel:     Component Value Date/Time   CHOL 140 07/01/2020 0830   CHOL 172 10/02/2016 0852   TRIG 113 07/01/2020 0830   TRIG 136 10/02/2016 0852   HDL 47 07/01/2020 0830   VLDL 27 10/02/2016 0852   LDLCALC 73 07/01/2020 0830 .   Marland Kitchen ASCVD risk enhancing conditions: age >23, DM, HTN . Unable to independently  HLD . Does not maintain contact with provider office RN Care Manager Clinical Goal(s):  . patient will work with Consulting civil engineer, providers, and care team towards execution of optimized self-health management plan . patient will verbalize understanding of plan for effective management of HLD . patient will work with [facility] and pcp to address needs related to effective management of HLD  Interventions: . Collaboration with Jon Billings, NP regarding development and update of comprehensive plan of care as evidenced by provider attestation and co-signature . Inter-disciplinary care team collaboration (see longitudinal plan of care) . Medication review performed; medication list updated in electronic medical record.  Bertram Savin care team collaboration (see longitudinal plan of care) . Referred to pharmacy team for assistance with HLD medication management . Evaluation of current treatment plan related to HLD and patient's adherence to plan as established by provider. . Advised patient to call the office for changes or questions  . Provided education to patient re: HLD management, Heart healthy/ADA diet and working with the [facility] to effectively manage health and well being.  . Discussed plans with patient for ongoing care management follow up and provided patient with direct contact information for care management team Patient Goals/Self-Care Activities: - call for medicine refill 2 or 3 days before it runs out - call if I am sick and can't take my medicine - keep a list of all the medicines I take; vitamins and herbals too - learn to read medicine labels - use a pillbox to sort medicine - use an alarm clock or phone to remind me to take my medicine - change to whole grain breads, cereal, pasta - drink 6 to 8 glasses of water each day - eat 3 to 5 servings of fruits and vegetables each day - eat 5 or 6 small meals each day - eat fish at least once per week - fill half the plate  with nonstarchy vegetables - limit fast food meals to no more than 1 per week - prepare main meal at home 3 to 5 days each week - read food labels for fat, fiber, carbohydrates and portion size - reduce red meat to 2 to 3 times a week - be open to making changes - I can manage, know and watch for signs of a heart attack - if I have chest pain, call for help - learn about small changes that will make a big difference - learn my personal risk factors - barriers to meeting goals identified - change-talk evoked - choices provided - collaboration with team encouraged - decision-making supported - health risks reviewed - problem-solving facilitated - questions answered - readiness for change evaluated - reassurance provided - self-reflection promoted - self-reliance encouraged  Follow Up Plan: Telephone follow up appointment with care management team member scheduled for: 01-19-2021 at 1 pm     Task: RNCM: Mutually  Develop and Royce Macadamia Achievement of Patient Goals   Note:   Care Management Activities:    - barriers to meeting goals identified - change-talk evoked - choices provided - collaboration with team encouraged - decision-making supported - health risks reviewed - problem-solving facilitated - questions answered - readiness for change evaluated - reassurance provided - self-reflection promoted - self-reliance encouraged         Plan:Telephone follow up appointment with care management team member scheduled for:  01-19-2021 at 1 pm  [place], MSN, [facility] Mobile: [phone]

## 2020-12-13 ENCOUNTER — Other Ambulatory Visit: Payer: Self-pay

## 2020-12-13 ENCOUNTER — Encounter: Payer: Self-pay | Admitting: Nurse Practitioner

## 2020-12-13 ENCOUNTER — Ambulatory Visit (INDEPENDENT_AMBULATORY_CARE_PROVIDER_SITE_OTHER): Payer: Medicare Other | Admitting: Nurse Practitioner

## 2020-12-13 VITALS — BP 161/91 | HR 80 | Temp 98.3°F | Wt 151.6 lb

## 2020-12-13 DIAGNOSIS — S93401A Sprain of unspecified ligament of right ankle, initial encounter: Secondary | ICD-10-CM

## 2020-12-13 MED ORDER — IBUPROFEN 800 MG PO TABS: 800.0000 mg | ORAL_TABLET | Freq: Three times a day (TID) | ORAL | 0 refills | Status: DC | PRN

## 2020-12-13 NOTE — Patient Instructions (Signed)
Keep icing and elevating your ankle when sitting. You can do some exercises like writing the alphabet with your foot. You can continue soaking in epsom salts. I am sending ibuprofen 800mg  you can take 3 times a day as needed for pain. You can also buy a compression ankle wrap at [facility] or other pharmacy to help with swelling and stability.

## 2020-12-13 NOTE — Progress Notes (Signed)
Acute Office Visit  Subjective:    Patient ID: Ellen [name], female    DOB: 1940/10/01, 80 y.o.   MRN: [id number]  Chief Complaint  Patient presents with   Ankle Pain    Pt states she has been having R ankle pain for about a week, no known injuries per patient    HPI Patient is in today for right ankle pain for a little over a week. She said that she was walking in [place] and felt her ankle twist while she was walking. She denies falling or stepping on anything.   ANKLE PAIN  Duration: weeks--a little over a week Involved foot: right Mechanism of injury:  felt ankle twist while walking, did not trip Location: right ankle Onset: sudden  Severity: 10/10  Quality:  aching and throbbing Frequency: constant Radiation: no Aggravating factors: weight bearing and walking  Alleviating factors: ice , ibuprofen, soak in epson salt Status: fluctuating Treatments attempted: rest, ice, and ibuprofen  Relief with NSAIDs?:  mild Weakness with weight bearing or walking: no Morning stiffness: no Swelling: yes Redness: no Bruising: no Paresthesias / decreased sensation: no  Fevers:no   Past Medical History:  Diagnosis Date   Hyperlipidemia    Hypertension    Lumbago    Menopause    Migraines     Past Surgical History:  Procedure Laterality Date   CHOLECYSTECTOMY  1982   TUBAL LIGATION  1984    Family History  Problem Relation Age of Onset   Alzheimer's disease Mother    Diabetes Mother    Stroke Mother    Hyperlipidemia Mother    Stroke Father    Hypertension Father    Hyperlipidemia Father    Diabetes Sister    Diabetes Brother    Hypertension Brother    Diabetes Sister     Social History   Socioeconomic History   Marital status: Married    Spouse name: Not on file   Number of children: Not on file   Years of education: some college   Highest education level: Some college, no degree  Occupational History   Occupation: retired  Tobacco Use    Smoking status: Never   Smokeless tobacco: Never  Scientific laboratory technician Use: Never used  Substance and Sexual Activity   Alcohol use: No    Alcohol/week: 0.0 standard drinks   Drug use: No   Sexual activity: Yes  Other Topics Concern   Not on file  Social History Narrative   Not on file   Social Determinants of Health   Financial Resource Strain: Low Risk    Difficulty of Paying Living Expenses: Not hard at all  Food Insecurity: No Food Insecurity   Worried About Charity fundraiser in the Last Year: Never true   Arboriculturist in the Last Year: Never true  Transportation Needs: No Transportation Needs   Lack of Transportation (Medical): No   Lack of Transportation (Non-Medical): No  Physical Activity: Sufficiently Active   Days of Exercise per Week: 3 days   Minutes of Exercise per Session: 60 min  Stress: No Stress Concern Present   Feeling of Stress : Not at all  Social Connections: Not on file  Intimate Partner Violence: Not on file    Outpatient Medications Prior to Visit  Medication Sig Dispense Refill   amLODipine (NORVASC) 5 MG tablet Take 1 tablet (5 mg total) by mouth daily. 90 tablet 4   aspirin 81 MG  tablet Take 81 mg by mouth daily.     atorvastatin (LIPITOR) 20 MG tablet Take 1 tablet (20 mg total) by mouth daily. 90 tablet 4   blood glucose meter kit and supplies KIT Dispense based on patient and insurance preference. Use up to four times daily as directed. (FOR ICD-9 250.00, 250.01). 1 each 0   CALCIUM-MAGNESIUM-ZINC PO Take 1 tablet by mouth daily.     calcium-vitamin D 250-100 MG-UNIT tablet Take 1 tablet by mouth 2 (two) times daily.     Cholecalciferol (VITAMIN D3) 1000 units CAPS Take 2 capsules by mouth daily.      lisinopril (ZESTRIL) 20 MG tablet Take 20 mg by mouth daily.     Magnesium 500 MG TABS Take 1 tablet by mouth daily.     metFORMIN (GLUCOPHAGE) 500 MG tablet Start out taking 1 tablet (500 MG) by mouth every morning with meal, then on  week two start taking 1 tablet (500 MG) by mouth in morning with meal and 1 tablet (500 MG) in evening with meal. 180 tablet 3   vitamin C (ASCORBIC ACID) 500 MG tablet Take 500 mg by mouth daily.     cyclobenzaprine (FLEXERIL) 10 MG tablet Take 10 mg by mouth at bedtime.     lisinopril (ZESTRIL) 40 MG tablet Take 1 tablet (40 mg total) by mouth daily. 90 tablet 3   lidocaine (LIDODERM) 5 % Place 1 patch onto the skin daily. Remove & Discard patch within 12 hours or as directed by MD (Patient not taking: Reported on 03/29/2020) 30 patch 0   No facility-administered medications prior to visit.    No Known Allergies  Review of Systems  Constitutional: Negative.   Respiratory: Negative.    Cardiovascular: Negative.   Gastrointestinal: Negative.   Genitourinary: Negative.   Musculoskeletal:  Positive for arthralgias (right ankle pain and sewlling).  Skin: Negative.   Neurological: Negative.       Objective:    Physical Exam Vitals and nursing note reviewed.  Constitutional:      General: She is not in acute distress.    Appearance: Normal appearance.  HENT:     Head: Normocephalic.  Eyes:     Conjunctiva/sclera: Conjunctivae normal.  Cardiovascular:     Rate and Rhythm: Normal rate and regular rhythm.     Pulses: Normal pulses.     Heart sounds: Normal heart sounds.  Pulmonary:     Effort: Pulmonary effort is normal.     Breath sounds: Normal breath sounds.  Musculoskeletal:        General: Swelling (right ankle) and tenderness (right medial ankle) present.     Cervical back: Normal range of motion.     Comments: Full ROM to right ankle  Skin:    General: Skin is warm.  Neurological:     General: No focal deficit present.     Mental Status: She is alert and oriented to person, place, and time.  Psychiatric:        Mood and Affect: Mood normal.        Behavior: Behavior normal.        Thought Content: Thought content normal.        Judgment: Judgment normal.    BP  (!) 161/91   Pulse 80   Temp 98.3 F (36.8 C) (Oral)   Wt 151 lb 9.6 oz (68.8 kg)   LMP 11/28/1994 (Approximate)   SpO2 97%   BMI 28.62 kg/m  Wt Readings from Last 3  Encounters:  12/13/20 151 lb 9.6 oz (68.8 kg)  07/01/20 160 lb (72.6 kg)  05/31/20 164 lb 3.2 oz (74.5 kg)    Health Maintenance Due  Topic Date Due   OPHTHALMOLOGY EXAM  Never done   Zoster Vaccines- Shingrix (1 of 2) Never done   FOOT EXAM  03/29/2016   TETANUS/TDAP  07/21/2018   MAMMOGRAM  06/05/2020   HEMOGLOBIN A1C  11/29/2020    There are no preventive care reminders to display for this patient.   Lab Results  Component Value Date   TSH 1.770 05/31/2020   Lab Results  Component Value Date   WBC 4.6 05/31/2020   HGB 12.6 05/31/2020   HCT 39.5 05/31/2020   MCV 89 05/31/2020   PLT 278 05/31/2020   Lab Results  Component Value Date   NA 142 07/01/2020   K 3.5 08/02/2020   CO2 27 07/01/2020   GLUCOSE 96 07/01/2020   BUN 15 07/01/2020   CREATININE 0.83 07/01/2020   BILITOT 0.2 05/31/2020   ALKPHOS 82 05/31/2020   AST 26 05/31/2020   ALT 22 05/31/2020   PROT 6.7 05/31/2020   ALBUMIN 4.3 05/31/2020   CALCIUM 9.9 07/01/2020   Lab Results  Component Value Date   CHOL 140 07/01/2020   Lab Results  Component Value Date   HDL 47 07/01/2020   Lab Results  Component Value Date   LDLCALC 73 07/01/2020   Lab Results  Component Value Date   TRIG 113 07/01/2020   No results found for: CHOLHDL Lab Results  Component Value Date   HGBA1C 6.5 (H) 05/31/2020       Assessment & Plan:   Problem List Items Addressed This Visit   None Visit Diagnoses     Sprain of right ankle, unspecified ligament, initial encounter    -  Primary   Ibuprofen 837m Rx to pharmacy. Rest, ice, elevate, and buy compresion ankle sleeve. Ankle exercises given. F/U if symptoms worsen or don't improve        Meds ordered this encounter  Medications   ibuprofen (ADVIL) 800 MG tablet    Sig: Take 1 tablet  (800 mg total) by mouth every 8 (eight) hours as needed.    Dispense:  30 tablet    Refill:  0     LCharyl Dancer NP

## 2020-12-15 ENCOUNTER — Other Ambulatory Visit: Payer: Self-pay

## 2020-12-15 ENCOUNTER — Ambulatory Visit: Payer: Medicare Other | Attending: Anesthesiology | Admitting: Anesthesiology

## 2020-12-15 ENCOUNTER — Encounter: Payer: Self-pay | Admitting: Anesthesiology

## 2020-12-15 DIAGNOSIS — M542 Cervicalgia: Secondary | ICD-10-CM

## 2020-12-15 DIAGNOSIS — M4716 Other spondylosis with myelopathy, lumbar region: Secondary | ICD-10-CM | POA: Diagnosis not present

## 2020-12-15 DIAGNOSIS — G8929 Other chronic pain: Secondary | ICD-10-CM

## 2020-12-15 DIAGNOSIS — M5136 Other intervertebral disc degeneration, lumbar region: Secondary | ICD-10-CM | POA: Diagnosis not present

## 2020-12-15 DIAGNOSIS — M48062 Spinal stenosis, lumbar region with neurogenic claudication: Secondary | ICD-10-CM | POA: Diagnosis not present

## 2020-12-15 DIAGNOSIS — M545 Low back pain, unspecified: Secondary | ICD-10-CM | POA: Diagnosis not present

## 2020-12-15 MED ORDER — CYCLOBENZAPRINE HCL 10 MG PO TABS: 10.0000 mg | ORAL_TABLET | Freq: Two times a day (BID) | ORAL | 3 refills | Status: AC | PRN

## 2020-12-15 NOTE — Progress Notes (Signed)
Virtual Visit via Telephone Note  I connected with Ann Bohne Haven on 12/15/20 at  2:55 PM EDT by telephone and verified that I am speaking with the correct person using two identifiers.  Location: Patient: Home Provider: [facility] control center   I discussed the limitations, risks, security and privacy concerns of performing an evaluation and management service by telephone and the availability of in person appointments. I also discussed with the patient that there may be a patient responsible charge related to this service. The patient expressed understanding and agreed to proceed. Review of systems: General: No nausea vomiting fevers chills Cardiac: No angina palpitation GI: No constipation or diarrhea Psych: No depression  History of Present Illness: I was able to catch up with [facility] via telephone as she was unable to do the video portion of the virtual conference.  She reports that she recently did stumble and sustained some significant right ankle type pain.  Her back has been stable.  The swelling in the ankle has improved furthermore her back pain is stable following her most recent lumbar facet block.  She is doing her stretching strengthening exercises as tolerated.  She really is requiring no other medication management and her only complaint is of some occasional spasming in the low back for which she in the past has taken Flexeril.  She is out of this at this time.  No change in lower extremity strength function bowel or bladder function is noted.   Observations/Objective:  Current Outpatient Medications:    cyclobenzaprine (FLEXERIL) 10 MG tablet, Take 1 tablet (10 mg total) by mouth 2 (two) times daily as needed for muscle spasms., Disp: 60 tablet, Rfl: 3   amLODipine (NORVASC) 5 MG tablet, Take 1 tablet (5 mg total) by mouth daily., Disp: 90 tablet, Rfl: 4   aspirin 81 MG tablet, Take 81 mg by mouth daily., Disp: , Rfl:    atorvastatin (LIPITOR) 20 MG tablet, Take 1  tablet (20 mg total) by mouth daily., Disp: 90 tablet, Rfl: 4   blood glucose meter kit and supplies KIT, Dispense based on patient and insurance preference. Use up to four times daily as directed. (FOR ICD-9 250.00, 250.01)., Disp: 1 each, Rfl: 0   CALCIUM-MAGNESIUM-ZINC PO, Take 1 tablet by mouth daily., Disp: , Rfl:    calcium-vitamin D 250-100 MG-UNIT tablet, Take 1 tablet by mouth 2 (two) times daily., Disp: , Rfl:    Cholecalciferol (VITAMIN D3) 1000 units CAPS, Take 2 capsules by mouth daily. , Disp: , Rfl:    ibuprofen (ADVIL) 800 MG tablet, Take 1 tablet (800 mg total) by mouth every 8 (eight) hours as needed., Disp: 30 tablet, Rfl: 0   lisinopril (ZESTRIL) 20 MG tablet, Take 20 mg by mouth daily., Disp: , Rfl:    Magnesium 500 MG TABS, Take 1 tablet by mouth daily., Disp: , Rfl:    metFORMIN (GLUCOPHAGE) 500 MG tablet, Start out taking 1 tablet (500 MG) by mouth every morning with meal, then on week two start taking 1 tablet (500 MG) by mouth in morning with meal and 1 tablet (500 MG) in evening with meal., Disp: 180 tablet, Rfl: 3   vitamin C (ASCORBIC ACID) 500 MG tablet, Take 500 mg by mouth daily., Disp: , Rfl:    Assessment and Plan: 1. Spinal stenosis of lumbar region with neurogenic claudication   2. DDD (degenerative disc disease), lumbar   3. Spondylosis, lumbar, with myelopathy   4. Cervicalgia   5. Chronic bilateral  low back pain without sciatica     Based on our discussion today we will defer on any other interventional therapy and schedule her for 72-monthreturn for routine follow-up.  Should she incur any further low back pain she would be a candidate for repeat facet block as discussed today.  In the meantime I am going to fill a prescription for Flexeril.  She is done well with this in the past for twice daily dosing.  Continue stretching strengthening exercises and continue follow-up with her primary care physicians for routine management of medical care Follow Up  Instructions:    I discussed the assessment and treatment plan with the patient. The patient was provided an opportunity to ask questions and all were answered. The patient agreed with the plan and demonstrated an understanding of the instructions.   The patient was advised to call back or seek an in-person evaluation if the symptoms worsen or if the condition fails to improve as anticipated.  I provided 20 minutes of non-face-to-face time during this encounter.   JMolli Barrows MD

## 2020-12-27 ENCOUNTER — Telehealth: Payer: Self-pay | Admitting: Anesthesiology

## 2020-12-27 ENCOUNTER — Other Ambulatory Visit: Payer: Self-pay

## 2020-12-27 ENCOUNTER — Encounter: Payer: Self-pay | Admitting: Anesthesiology

## 2020-12-27 ENCOUNTER — Ambulatory Visit: Payer: Medicare Other | Attending: Anesthesiology | Admitting: Anesthesiology

## 2020-12-27 DIAGNOSIS — M5431 Sciatica, right side: Secondary | ICD-10-CM | POA: Diagnosis not present

## 2020-12-27 DIAGNOSIS — G8929 Other chronic pain: Secondary | ICD-10-CM

## 2020-12-27 DIAGNOSIS — M4716 Other spondylosis with myelopathy, lumbar region: Secondary | ICD-10-CM | POA: Diagnosis not present

## 2020-12-27 DIAGNOSIS — M545 Low back pain, unspecified: Secondary | ICD-10-CM

## 2020-12-27 DIAGNOSIS — M5136 Other intervertebral disc degeneration, lumbar region: Secondary | ICD-10-CM

## 2020-12-27 DIAGNOSIS — M542 Cervicalgia: Secondary | ICD-10-CM | POA: Diagnosis not present

## 2020-12-27 DIAGNOSIS — M48062 Spinal stenosis, lumbar region with neurogenic claudication: Secondary | ICD-10-CM

## 2020-12-27 DIAGNOSIS — M51369 Other intervertebral disc degeneration, lumbar region without mention of lumbar back pain or lower extremity pain: Secondary | ICD-10-CM

## 2020-12-27 MED ORDER — TRAMADOL HCL 50 MG PO TABS: 50.0000 mg | ORAL_TABLET | Freq: Three times a day (TID) | ORAL | 2 refills | Status: AC | PRN

## 2020-12-27 NOTE — Progress Notes (Signed)
Virtual Visit via Telephone Note  I connected with Ellen [name] on 12/27/20 at  1:00 PM EDT by telephone and verified that I am speaking with the correct person using two identifiers.  Location: Patient: Home Provider: Pain control center   I discussed the limitations, risks, security and privacy concerns of performing an evaluation and management service by telephone and the availability of in person appointments. I also discussed with the patient that there may be a patient responsible charge related to this service. The patient expressed understanding and agreed to proceed.   History of Present Illness: I spoke with Stanton Kidney today via telephone as we were unable to link for the video portion of the virtual conference but she reports that she has been having a significant increase in the right lower extremity sciatica symptoms.  She had an epidural back in October of last year and went for several months with significant improvement but has had recurrence of this.  Despite conservative therapy and using her Flexeril twice a day the pain persists and runs from the lower back down in the right buttocks down to the right calf.  In the past she had this and an epidural yielded resolution.  She would like to come into clinic for a repeat epidural.  No change in bowel or bladder function is noted.  The pain has been severe and quite recalcitrant and despite exercises and stretching it continues.  Review of systems: General: No fevers or chills Pulmonary: No shortness of breath or dyspnea Cardiac: No angina or palpitations or lightheadedness GI: No abdominal pain or constipation Psych: No depression    Observations/Objective:  Current Outpatient Medications:    traMADol (ULTRAM) 50 MG tablet, Take 1 tablet (50 mg total) by mouth every 8 (eight) hours as needed., Disp: 30 tablet, Rfl: 2   amLODipine (NORVASC) 5 MG tablet, Take 1 tablet (5 mg total) by mouth daily., Disp: 90 tablet, Rfl: 4    aspirin 81 MG tablet, Take 81 mg by mouth daily., Disp: , Rfl:    atorvastatin (LIPITOR) 20 MG tablet, Take 1 tablet (20 mg total) by mouth daily., Disp: 90 tablet, Rfl: 4   blood glucose meter kit and supplies KIT, Dispense based on patient and insurance preference. Use up to four times daily as directed. (FOR ICD-9 250.00, 250.01)., Disp: 1 each, Rfl: 0   CALCIUM-MAGNESIUM-ZINC PO, Take 1 tablet by mouth daily., Disp: , Rfl:    calcium-vitamin D 250-100 MG-UNIT tablet, Take 1 tablet by mouth 2 (two) times daily., Disp: , Rfl:    Cholecalciferol (VITAMIN D3) 1000 units CAPS, Take 2 capsules by mouth daily. , Disp: , Rfl:    cyclobenzaprine (FLEXERIL) 10 MG tablet, Take 1 tablet (10 mg total) by mouth 2 (two) times daily as needed for muscle spasms., Disp: 60 tablet, Rfl: 3   ibuprofen (ADVIL) 800 MG tablet, Take 1 tablet (800 mg total) by mouth every 8 (eight) hours as needed., Disp: 30 tablet, Rfl: 0   lisinopril (ZESTRIL) 20 MG tablet, Take 20 mg by mouth daily., Disp: , Rfl:    Magnesium 500 MG TABS, Take 1 tablet by mouth daily., Disp: , Rfl:    metFORMIN (GLUCOPHAGE) 500 MG tablet, Start out taking 1 tablet (500 MG) by mouth every morning with meal, then on week two start taking 1 tablet (500 MG) by mouth in morning with meal and 1 tablet (500 MG) in evening with meal., Disp: 180 tablet, Rfl: 3   vitamin C (ASCORBIC  ACID) 500 MG tablet, Take 500 mg by mouth daily., Disp: , Rfl:    Assessment and Plan: 1. Spinal stenosis of lumbar region with neurogenic claudication   2. DDD (degenerative disc disease), lumbar   3. Spondylosis, lumbar, with myelopathy   4. Chronic bilateral low back pain without sciatica   5. Cervicalgia   6. Sciatica of right side   Based on her discussion today I feel that she would be a candidate start on tramadol.  She has been on more conservative medications without any significant improvement.  We will start her on 50 mg 3 times a day and have given her instructions  on managing this.  She can increase her Flexeril to twice daily or 3 times daily dosing as needed for the spasms.  I want her to continue stretching and we will look to have her return to clinic within the next week or so for an epidural steroid injection.  Follow Up Instructions:    I discussed the assessment and treatment plan with the patient. The patient was provided an opportunity to ask questions and all were answered. The patient agreed with the plan and demonstrated an understanding of the instructions.   The patient was advised to call back or seek an in-person evaluation if the symptoms worsen or if the condition fails to improve as anticipated.  I provided 25 minutes of non-face-to-face time during this encounter.   Molli Barrows, MD

## 2020-12-27 NOTE — Telephone Encounter (Signed)
Put her on for a  virtual today

## 2020-12-27 NOTE — Telephone Encounter (Signed)
Patient called, she is still having significant pain and the meds aren't helping very much. Please contact Dr. Pernell Dupre. He told patient to call if meds didn't help.

## 2021-01-10 ENCOUNTER — Encounter: Payer: Self-pay | Admitting: Anesthesiology

## 2021-01-10 ENCOUNTER — Other Ambulatory Visit: Payer: Self-pay

## 2021-01-10 ENCOUNTER — Ambulatory Visit (HOSPITAL_BASED_OUTPATIENT_CLINIC_OR_DEPARTMENT_OTHER): Payer: Medicare Other | Admitting: Anesthesiology

## 2021-01-10 ENCOUNTER — Ambulatory Visit
Admission: RE | Admit: 2021-01-10 | Discharge: 2021-01-10 | Disposition: A | Discharge status: Home / Self Care | Payer: Medicare Other | Source: Ambulatory Visit | Attending: Anesthesiology | Admitting: Anesthesiology

## 2021-01-10 ENCOUNTER — Other Ambulatory Visit: Payer: Self-pay | Admitting: Anesthesiology

## 2021-01-10 VITALS — BP 151/83 | HR 114 | Temp 97.2°F | Resp 20 | Ht 62.0 in | Wt 155.0 lb

## 2021-01-10 DIAGNOSIS — M542 Cervicalgia: Secondary | ICD-10-CM | POA: Diagnosis not present

## 2021-01-10 DIAGNOSIS — M48062 Spinal stenosis, lumbar region with neurogenic claudication: Secondary | ICD-10-CM

## 2021-01-10 DIAGNOSIS — M545 Low back pain, unspecified: Secondary | ICD-10-CM | POA: Insufficient documentation

## 2021-01-10 DIAGNOSIS — M4716 Other spondylosis with myelopathy, lumbar region: Secondary | ICD-10-CM

## 2021-01-10 DIAGNOSIS — M5136 Other intervertebral disc degeneration, lumbar region: Secondary | ICD-10-CM | POA: Diagnosis not present

## 2021-01-10 DIAGNOSIS — M5431 Sciatica, right side: Secondary | ICD-10-CM

## 2021-01-10 DIAGNOSIS — R52 Pain, unspecified: Secondary | ICD-10-CM

## 2021-01-10 DIAGNOSIS — G8929 Other chronic pain: Secondary | ICD-10-CM | POA: Diagnosis not present

## 2021-01-10 MED ORDER — ROPIVACAINE HCL 2 MG/ML IJ SOLN
10.0000 mL | Freq: Once | INTRAMUSCULAR | Status: AC
  Administered 2021-01-10: 20 mL via EPIDURAL

## 2021-01-10 MED ORDER — ORPHENADRINE CITRATE 30 MG/ML IJ SOLN
INTRAMUSCULAR | Status: AC
  Filled 2021-01-10: qty 2

## 2021-01-10 MED ORDER — IOHEXOL 180 MG/ML  SOLN
INTRAMUSCULAR | Status: AC
  Filled 2021-01-10: qty 20

## 2021-01-10 MED ORDER — LIDOCAINE HCL (PF) 1 % IJ SOLN
5.0000 mL | Freq: Once | INTRAMUSCULAR | Status: AC
  Administered 2021-01-10: 5 mL via SUBCUTANEOUS

## 2021-01-10 MED ORDER — SODIUM CHLORIDE (PF) 0.9 % IJ SOLN
INTRAMUSCULAR | Status: AC
  Filled 2021-01-10: qty 10

## 2021-01-10 MED ORDER — LIDOCAINE HCL (PF) 2 % IJ SOLN
INTRAMUSCULAR | Status: AC
  Filled 2021-01-10: qty 5

## 2021-01-10 MED ORDER — ROPIVACAINE HCL 2 MG/ML IJ SOLN
INTRAMUSCULAR | Status: AC
  Filled 2021-01-10: qty 20

## 2021-01-10 MED ORDER — TRIAMCINOLONE ACETONIDE 40 MG/ML IJ SUSP
40.0000 mg | Freq: Once | INTRAMUSCULAR | Status: AC
  Administered 2021-01-10: 40 mg

## 2021-01-10 MED ORDER — ORPHENADRINE CITRATE 30 MG/ML IJ SOLN
60.0000 mg | Freq: Once | INTRAMUSCULAR | Status: AC
  Administered 2021-01-10: 60 mg via INTRAMUSCULAR

## 2021-01-10 MED ORDER — SODIUM CHLORIDE 0.9% FLUSH
10.0000 mL | Freq: Once | INTRAVENOUS | Status: AC
  Administered 2021-01-10: 10 mL

## 2021-01-10 MED ORDER — TRIAMCINOLONE ACETONIDE 40 MG/ML IJ SUSP
INTRAMUSCULAR | Status: AC
  Filled 2021-01-10: qty 1

## 2021-01-10 MED ORDER — IOHEXOL 180 MG/ML  SOLN
10.0000 mL | Freq: Once | INTRAMUSCULAR | Status: AC | PRN
  Administered 2021-01-10: 10 mL via EPIDURAL

## 2021-01-10 NOTE — Progress Notes (Signed)
Safety precautions to be maintained throughout the outpatient stay will include: orient to surroundings, keep bed in low position, maintain call bell within reach at all times, provide assistance with transfer out of bed and ambulation.  

## 2021-01-10 NOTE — Patient Instructions (Signed)

## 2021-01-10 NOTE — Progress Notes (Signed)
Subjective:  Patient ID: Ellen [name], female    DOB: 1941-04-19  Age: 80 y.o. MRN: [id number]  CC: Back Pain (lower)   Procedure: L5 S1 epidural steroid under fluoroscopic guidance with no sedation  HPI Ellen [name] presents for reevaluation.  She continues to have right and to a slightly lesser extent left lower extremity sciatica symptoms which have been quite problematic for her recently.  In the past she has had epidural steroid injections.  Her last injections were in May and July and October 2021.  The pain she is presenting with today is comparable to what she had in the past.  This has been a recurrence for her.  This pain radiates from the low back down into the buttocks and into the calves and recently has been increasingly on the left side but mainly has been on the right.  No new weakness is reported in bowel and bladder function has been stable.  She generally takes tramadol for pain relief.  Otherwise she reports that she is in her usual state of health today.  Outpatient Medications Prior to Visit  Medication Sig Dispense Refill   amLODipine (NORVASC) 5 MG tablet Take 1 tablet (5 mg total) by mouth daily. 90 tablet 4   aspirin 81 MG tablet Take 81 mg by mouth daily.     atorvastatin (LIPITOR) 20 MG tablet Take 1 tablet (20 mg total) by mouth daily. 90 tablet 4   blood glucose meter kit and supplies KIT Dispense based on patient and insurance preference. Use up to four times daily as directed. (FOR ICD-9 250.00, 250.01). 1 each 0   CALCIUM-MAGNESIUM-ZINC PO Take 1 tablet by mouth daily.     Cholecalciferol (VITAMIN D3) 1000 units CAPS Take 2 capsules by mouth daily.      cyclobenzaprine (FLEXERIL) 10 MG tablet Take 1 tablet (10 mg total) by mouth 2 (two) times daily as needed for muscle spasms. 60 tablet 3   ibuprofen (ADVIL) 800 MG tablet Take 1 tablet (800 mg total) by mouth every 8 (eight) hours as needed. 30 tablet 0   lisinopril (ZESTRIL) 20 MG tablet Take 20 mg  by mouth daily.     Magnesium 500 MG TABS Take 1 tablet by mouth daily.     metFORMIN (GLUCOPHAGE) 500 MG tablet Start out taking 1 tablet (500 MG) by mouth every morning with meal, then on week two start taking 1 tablet (500 MG) by mouth in morning with meal and 1 tablet (500 MG) in evening with meal. 180 tablet 3   traMADol (ULTRAM) 50 MG tablet Take 1 tablet (50 mg total) by mouth every 8 (eight) hours as needed. 30 tablet 2   calcium-vitamin D 250-100 MG-UNIT tablet Take 1 tablet by mouth 2 (two) times daily. (Patient not taking: Reported on 01/10/2021)     vitamin C (ASCORBIC ACID) 500 MG tablet Take 500 mg by mouth daily. (Patient not taking: Reported on 01/10/2021)     No facility-administered medications prior to visit.    Review of Systems CNS: No confusion or sedation Cardiac: No angina or palpitations GI: No abdominal pain or constipation Constitutional: No nausea vomiting fevers or chills  Objective:  BP (!) 151/83   Pulse (!) 114   Temp (!) 97.2 F (36.2 C) (Temporal)   Resp 20   Ht '5\' 2"'  (1.575 m)   Wt 155 lb (70.3 kg)   LMP 11/28/1994 (Approximate)   SpO2 98%   BMI 28.35 kg/m  BP Readings from Last 3 Encounters:  01/10/21 (!) 151/83  12/13/20 (!) 161/91  07/01/20 122/67     Wt Readings from Last 3 Encounters:  01/10/21 155 lb (70.3 kg)  12/13/20 151 lb 9.6 oz (68.8 kg)  07/01/20 160 lb (72.6 kg)     Physical Exam Pt is alert and oriented PERRL EOMI HEART IS RRR no murmur or rub LCTA no wheezing or rales MUSCULOSKELETAL reveals a positive straight leg raise on the right side.  Her muscle tone and bulk is at baseline.  She does walk with an antalgic gait and does have pain going from seated to standing.  Strength appears to be well preserved.  Labs  Lab Results  Component Value Date   HGBA1C 6.5 (H) 05/31/2020   HGBA1C 6.5 (H) 11/19/2019   HGBA1C 6.5 (H) 05/21/2019   Lab Results  Component Value Date   MICROALBUR 30 (H) 05/31/2020   LDLCALC  73 07/01/2020   CREATININE 0.83 07/01/2020    -------------------------------------------------------------------------------------------------------------------- Lab Results  Component Value Date   WBC 4.6 05/31/2020   HGB 12.6 05/31/2020   HCT 39.5 05/31/2020   PLT 278 05/31/2020   GLUCOSE 96 07/01/2020   CHOL 140 07/01/2020   TRIG 113 07/01/2020   HDL 47 07/01/2020   LDLCALC 73 07/01/2020   ALT 22 05/31/2020   AST 26 05/31/2020   NA 142 07/01/2020   K 3.5 08/02/2020   CL 98 07/01/2020   CREATININE 0.83 07/01/2020   BUN 15 07/01/2020   CO2 27 07/01/2020   TSH 1.770 05/31/2020   HGBA1C 6.5 (H) 05/31/2020   MICROALBUR 30 (H) 05/31/2020    --------------------------------------------------------------------------------------------------------------------- DG PAIN CLINIC C-ARM 1-60 MIN NO REPORT  Result Date: 01/10/2021 Fluoro was used, but no Radiologist interpretation will be provided. Please refer to "NOTES" tab for provider progress note.    Assessment & Plan:   Mona was seen today for back pain.  Diagnoses and all orders for this visit:  Spondylosis, lumbar, with myelopathy -     Lumbar Epidural Injection; Future  Spinal stenosis of lumbar region with neurogenic claudication -     Lumbar Epidural Injection -     Lumbar Epidural Injection; Future  DDD (degenerative disc disease), lumbar -     Lumbar Epidural Injection -     Lumbar Epidural Injection; Future  Sciatica of right side -     Lumbar Epidural Injection -     Lumbar Epidural Injection; Future  Cervicalgia  Chronic bilateral low back pain without sciatica  Other orders -     triamcinolone acetonide (KENALOG-40) injection 40 mg -     sodium chloride flush (NS) 0.9 % injection 10 mL -     ropivacaine (PF) 2 mg/mL (0.2%) (NAROPIN) injection 10 mL -     lidocaine (PF) (XYLOCAINE) 1 % injection 5 mL -     iohexol (OMNIPAQUE) 180 MG/ML injection 10 mL -     orphenadrine (NORFLEX) injection 60  mg        ----------------------------------------------------------------------------------------------------------------------  Problem List Items Addressed This Visit       Unprioritized   Back pain   Cervicalgia   Other Visit Diagnoses     Spondylosis, lumbar, with myelopathy    -  Primary   Relevant Medications   triamcinolone acetonide (KENALOG-40) injection 40 mg (Completed)   orphenadrine (NORFLEX) injection 60 mg (Completed)   Other Relevant Orders   Lumbar Epidural Injection   Spinal stenosis of lumbar region with neurogenic claudication  Relevant Medications   triamcinolone acetonide (KENALOG-40) injection 40 mg (Completed)   ropivacaine (PF) 2 mg/mL (0.2%) (NAROPIN) injection 10 mL (Completed)   lidocaine (PF) (XYLOCAINE) 1 % injection 5 mL (Completed)   orphenadrine (NORFLEX) injection 60 mg (Completed)   Other Relevant Orders   Lumbar Epidural Injection   DDD (degenerative disc disease), lumbar       Relevant Medications   triamcinolone acetonide (KENALOG-40) injection 40 mg (Completed)   orphenadrine (NORFLEX) injection 60 mg (Completed)   Other Relevant Orders   Lumbar Epidural Injection   Sciatica of right side       Relevant Medications   orphenadrine (NORFLEX) injection 60 mg (Completed)   Other Relevant Orders   Lumbar Epidural Injection         ----------------------------------------------------------------------------------------------------------------------  1. Spinal stenosis of lumbar region with neurogenic claudication We will proceed with a repeat lumbar epidural today.  The risks and benefits of been reviewed once again.  She is responded favorably to these in the past.  We will schedule her for return to clinic in 1 to 2 months for possible repeat injection if indicated at that time.  I encouraged her to continue with stretching strengthening exercises as reviewed as well.  Continue tramadol. - Lumbar Epidural Injection - Lumbar  Epidural Injection; Future  2. DDD (degenerative disc disease), lumbar As above - Lumbar Epidural Injection - Lumbar Epidural Injection; Future  3. Sciatica of right side As above - Lumbar Epidural Injection - Lumbar Epidural Injection; Future  4. Spondylosis, lumbar, with myelopathy As above - Lumbar Epidural Injection; Future  5. Cervicalgia She is having some radiating pain from the lumbar region into the central cervical region approximately C7-T1.  She does have some spasming there.  We have offered her a Norflex 60 mg IM injection and she desires to proceed with that as well.  6. Chronic bilateral low back pain without sciatica As above    ----------------------------------------------------------------------------------------------------------------------  I am having Delight E. Konicek maintain her aspirin, Vitamin D3, Magnesium, calcium-vitamin D, vitamin C, CALCIUM-MAGNESIUM-ZINC PO, amLODipine, atorvastatin, metFORMIN, blood glucose meter kit and supplies, lisinopril, ibuprofen, cyclobenzaprine, and traMADol. We administered triamcinolone acetonide, sodium chloride flush, ropivacaine (PF) 2 mg/mL (0.2%), lidocaine (PF), iohexol, and orphenadrine.   Meds ordered this encounter  Medications   triamcinolone acetonide (KENALOG-40) injection 40 mg   sodium chloride flush (NS) 0.9 % injection 10 mL   ropivacaine (PF) 2 mg/mL (0.2%) (NAROPIN) injection 10 mL   lidocaine (PF) (XYLOCAINE) 1 % injection 5 mL   iohexol (OMNIPAQUE) 180 MG/ML injection 10 mL   orphenadrine (NORFLEX) injection 60 mg   Patient's Medications  New Prescriptions   No medications on file  Previous Medications   AMLODIPINE (NORVASC) 5 MG TABLET    Take 1 tablet (5 mg total) by mouth daily.   ASPIRIN 81 MG TABLET    Take 81 mg by mouth daily.   ATORVASTATIN (LIPITOR) 20 MG TABLET    Take 1 tablet (20 mg total) by mouth daily.   BLOOD GLUCOSE METER KIT AND SUPPLIES KIT    Dispense based on patient  and insurance preference. Use up to four times daily as directed. (FOR ICD-9 250.00, 250.01).   CALCIUM-MAGNESIUM-ZINC PO    Take 1 tablet by mouth daily.   CALCIUM-VITAMIN D 250-100 MG-UNIT TABLET    Take 1 tablet by mouth 2 (two) times daily.   CHOLECALCIFEROL (VITAMIN D3) 1000 UNITS CAPS    Take 2 capsules by mouth daily.  CYCLOBENZAPRINE (FLEXERIL) 10 MG TABLET    Take 1 tablet (10 mg total) by mouth 2 (two) times daily as needed for muscle spasms.   IBUPROFEN (ADVIL) 800 MG TABLET    Take 1 tablet (800 mg total) by mouth every 8 (eight) hours as needed.   LISINOPRIL (ZESTRIL) 20 MG TABLET    Take 20 mg by mouth daily.   MAGNESIUM 500 MG TABS    Take 1 tablet by mouth daily.   METFORMIN (GLUCOPHAGE) 500 MG TABLET    Start out taking 1 tablet (500 MG) by mouth every morning with meal, then on week two start taking 1 tablet (500 MG) by mouth in morning with meal and 1 tablet (500 MG) in evening with meal.   TRAMADOL (ULTRAM) 50 MG TABLET    Take 1 tablet (50 mg total) by mouth every 8 (eight) hours as needed.   VITAMIN C (ASCORBIC ACID) 500 MG TABLET    Take 500 mg by mouth daily.  Modified Medications   No medications on file  Discontinued Medications   No medications on file   ----------------------------------------------------------------------------------------------------------------------  Follow-up: Return in about 1 month (around 02/10/2021) for evaluation, procedure.   Procedure: L5-S1 LESI with fluoroscopic guidance and no moderate sedation  NOTE: The risks, benefits, and expectations of the procedure have been discussed and explained to the patient who was understanding and in agreement with suggested treatment plan. No guarantees were made.  DESCRIPTION OF PROCEDURE: Lumbar epidural steroid injection with no IV Versed, EKG, blood pressure, pulse, and pulse oximetry monitoring. The procedure was performed with the patient in the prone position under fluoroscopic guidance.   Sterile prep x3 was initiated and I then injected subcutaneous lidocaine to the overlying L5-S1 site after its fluoroscopic identifictation.  Using strict aseptic technique, I then advanced an 18-gauge Tuohy epidural needle in the midline using interlaminar approach via loss-of-resistance to saline technique. There was negative aspiration for heme or  CSF.  I then confirmed position with both AP and Lateral fluoroscan.  2 cc of contrast dye were injected and a  total of 5 mL of Preservative-Free normal saline mixed with 40 mg of Kenalog and 1cc Ropicaine 0.2 percent were injected incrementally via the  epidurally placed needle. The needle was removed. The patient tolerated the injection well and was convalesced and discharged to home in stable condition. Should the patient have any post procedure difficulty they have been instructed on how to contact us for assistance.    Molli Barrows, MD

## 2021-01-17 ENCOUNTER — Ambulatory Visit: Payer: Medicare Other | Admitting: Nurse Practitioner

## 2021-01-19 ENCOUNTER — Ambulatory Visit (INDEPENDENT_AMBULATORY_CARE_PROVIDER_SITE_OTHER): Payer: Medicare Other | Admitting: General Practice

## 2021-01-19 ENCOUNTER — Telehealth: Payer: Self-pay | Admitting: General Practice

## 2021-01-19 DIAGNOSIS — I152 Hypertension secondary to endocrine disorders: Secondary | ICD-10-CM

## 2021-01-19 DIAGNOSIS — G8929 Other chronic pain: Secondary | ICD-10-CM

## 2021-01-19 DIAGNOSIS — M545 Low back pain, unspecified: Secondary | ICD-10-CM

## 2021-01-19 DIAGNOSIS — E1129 Type 2 diabetes mellitus with other diabetic kidney complication: Secondary | ICD-10-CM

## 2021-01-19 DIAGNOSIS — E1159 Type 2 diabetes mellitus with other circulatory complications: Secondary | ICD-10-CM

## 2021-01-19 DIAGNOSIS — E782 Mixed hyperlipidemia: Secondary | ICD-10-CM

## 2021-01-19 NOTE — Chronic Care Management (AMB) (Signed)
Chronic Care Management   CCM RN Visit Note  01/19/2021 Name: Ellen [name] MRN: [id number] DOB: 03-29-1941  Subjective: Ellen [name] is a 80 y.o. year old female who is a primary care patient of Jon Billings, NP. The care management team was consulted for assistance with disease management and care coordination needs.    Engaged with patient by telephone for follow up visit in response to provider referral for case management and/or care coordination services.   Consent to Services:  The patient was given information about Chronic Care Management services, agreed to services, and gave verbal consent prior to initiation of services.  Please see initial visit note for detailed documentation.   Patient agreed to services and verbal consent obtained.   Assessment: Review of patient past medical history, allergies, medications, health status, including review of consultants reports, laboratory and other test data, was performed as part of comprehensive evaluation and provision of chronic care management services.   SDOH (Social Determinants of Health) assessments and interventions performed:  SDOH Interventions    Flowsheet Row Most Recent Value  SDOH Interventions   Physical Activity Interventions Other (Comments)  [the patient is active and stays busy, no structured activity]        CCM Care Plan  No Known Allergies  Outpatient Encounter Medications as of 01/19/2021  Medication Sig   amLODipine (NORVASC) 5 MG tablet Take 1 tablet (5 mg total) by mouth daily.   aspirin 81 MG tablet Take 81 mg by mouth daily.   atorvastatin (LIPITOR) 20 MG tablet Take 1 tablet (20 mg total) by mouth daily.   blood glucose meter kit and supplies KIT Dispense based on patient and insurance preference. Use up to four times daily as directed. (FOR ICD-9 250.00, 250.01).   CALCIUM-MAGNESIUM-ZINC PO Take 1 tablet by mouth daily.   calcium-vitamin D 250-100 MG-UNIT tablet Take 1 tablet by  mouth 2 (two) times daily. (Patient not taking: Reported on 01/10/2021)   Cholecalciferol (VITAMIN D3) 1000 units CAPS Take 2 capsules by mouth daily.    ibuprofen (ADVIL) 800 MG tablet Take 1 tablet (800 mg total) by mouth every 8 (eight) hours as needed.   lisinopril (ZESTRIL) 20 MG tablet Take 20 mg by mouth daily.   Magnesium 500 MG TABS Take 1 tablet by mouth daily.   metFORMIN (GLUCOPHAGE) 500 MG tablet Start out taking 1 tablet (500 MG) by mouth every morning with meal, then on week two start taking 1 tablet (500 MG) by mouth in morning with meal and 1 tablet (500 MG) in evening with meal.   traMADol (ULTRAM) 50 MG tablet Take 1 tablet (50 mg total) by mouth every 8 (eight) hours as needed.   vitamin C (ASCORBIC ACID) 500 MG tablet Take 500 mg by mouth daily. (Patient not taking: Reported on 01/10/2021)   No facility-administered encounter medications on file as of 01/19/2021.    Patient Active Problem List   Diagnosis Date Noted   Obesity 05/31/2020   Cervicalgia 12/03/2019   Hyperlipidemia associated with type 2 diabetes mellitus ([place]) 11/08/2018   Atherosclerosis of aorta ([place]) 11/13/2017   Back pain 06/13/2017   Muscle soreness 05/16/2017   Advanced care planning/counseling discussion 04/17/2017   Hypertension associated with diabetes ([place]) 10/04/2015   Menopause 03/29/2015   Type 2 diabetes mellitus with proteinuria ([place]) 03/29/2015    Conditions to be addressed/monitored:HTN, HLD, DMII, and chronic back pain   Care Plan : RNCM: Diabetes Type 2 (Adult)  Updates made by Hall Busing,  Nobie Putnam since 01/19/2021 12:00 AM     Problem: RNCM: Glycemic Management (Diabetes, Type 2)   Priority: Medium     Long-Range Goal: RNCM: Glycemic Management Optimized   Start Date: 11/17/2020  Expected End Date: 11/17/2021  This Visit's Progress: On track  Priority: Medium  Note:   Objective:  Lab Results  Component Value Date   HGBA1C 6.5 (H) 05/31/2020    Lab Results  Component Value Date    CREATININE 0.83 07/01/2020   CREATININE 0.85 05/31/2020   CREATININE 0.85 11/19/2019   No results found for: EGFR Current Barriers:  Knowledge Deficits related to basic Diabetes pathophysiology and self care/management Knowledge Deficits related to medications used for management of diabetes Limited Social Support Lacks social connections Does not maintain contact with provider office Case Manager Clinical Goal(s):  patient will demonstrate improved adherence to prescribed treatment plan for diabetes self care/management as evidenced by: daily monitoring and recording of CBG  adherence to ADA/ carb modified diet adherence to prescribed medication regimen contacting provider for new or worsened symptoms or questions Interventions:  Collaboration with Jon Billings, NP regarding development and update of comprehensive plan of care as evidenced by provider attestation and co-signature Inter-disciplinary care team collaboration (see longitudinal plan of care) Provided education to patient about basic DM disease process Reviewed medications with patient and discussed importance of medication adherence. 01-19-2021: The patient is compliant with medications.  Discussed plans with patient for ongoing care management follow up and provided patient with direct contact information for care management team Provided patient with written educational materials related to hypo and hyperglycemia and importance of correct treatment. States blood sugar range has been 105 to 129. Denies any lows. 01-19-2021: The patient has not been checking her blood sugars regularly. Could not tell me any recent readings. Ask the patient to write down on a pad information so she could provide numbers at next outreach. The patient verbalized understanding.  Advised patient, providing education and rationale, to check cbg weekly and record, calling pcp for findings outside established parameters.   Review of patient status,  including review of consultants reports, relevant laboratory and other test results, and medications completed. Self-Care Activities - Self administers oral medications as prescribed Attends all scheduled provider appointments Checks blood sugars as prescribed and utilize hyper and hypoglycemia protocol as needed Adheres to prescribed ADA/carb modified Patient Goals: - check blood sugar at prescribed times - check blood sugar before and after exercise - check blood sugar if I feel it is too high or too low - enter blood sugar readings and medication or insulin into daily log - take the blood sugar log to all doctor visits - change to whole grain breads, cereal, pasta - set goal weight - drink 6 to 8 glasses of water each day - eat fish at least once per week - limit fast food meals to no more than 1 per week - manage portion size - prepare main meal at home 3 to 5 days each week - read food labels for fat, fiber, carbohydrates and portion size - reduce red meat to 2 to 3 times a week - schedule appointment with eye doctor - check feet daily for cuts, sores or redness - keep feet up while sitting - trim toenails straight across - wash and dry feet carefully every day - wear comfortable, cotton socks - wear comfortable, well-fitting shoes - barriers to adherence to treatment plan identified - blood glucose monitoring encouraged - blood glucose readings reviewed -  self-awareness of signs/symptoms of hypo or hyperglycemia encouraged - use of blood glucose monitoring log promoted Follow Up Plan: Telephone follow up appointment with care management team member scheduled for: 03-23-2021 at 1 pm    Task: RNCM: Alleviate Barriers to Glycemic Management Completed 01/19/2021  Outcome: Positive  Note:   Care Management Activities:    - barriers to adherence to treatment plan identified - blood glucose monitoring encouraged - blood glucose readings reviewed - self-awareness of  signs/symptoms of hypo or hyperglycemia encouraged - use of blood glucose monitoring log promoted        Care Plan : RNCM: Hypertension (Adult)  Updates made by Vanita Ingles since 01/19/2021 12:00 AM     Problem: RNCM: Hypertension (Hypertension)   Priority: Medium     Long-Range Goal: RNCM: Hypertension Monitored   Start Date: 11/17/2020  Expected End Date: 11/17/2021  This Visit's Progress: On track  Priority: Medium  Note:   Objective:  Last practice recorded BP readings:  BP Readings from Last 3 Encounters:  01/10/21 (!) 151/83  12/13/20 (!) 161/91  07/01/20 122/67    Most recent eGFR/CrCl: No results found for: EGFR  No components found for: CRCL Current Barriers:  Knowledge Deficits related to basic understanding of hypertension pathophysiology and self care management Knowledge Deficits related to understanding of medications prescribed for management of hypertension Limited Social Support Lacks social connections Does not maintain contact with provider office Case Manager Clinical Goal(s):  patient will verbalize understanding of plan for hypertension management patient will demonstrate improved adherence to prescribed treatment plan for hypertension as evidenced by taking all medications as prescribed, monitoring and recording blood pressure as directed, adhering to low sodium/DASH diet patient will demonstrate improved health management independence as evidenced by checking blood pressure as directed and notifying PCP if SBP>150 or DBP > 90, taking all medications as prescribe, and adhering to a low sodium diet as discussed. patient will verbalize basic understanding of hypertension disease process and self health management plan as evidenced by compliance with medications, compliance with heart heatlhy/Ada diet, and working with the CCM team to effectively manage health and well being.  Interventions:  Collaboration with Jon Billings, NP regarding development and  update of comprehensive plan of care as evidenced by provider attestation and co-signature Inter-disciplinary care team collaboration (see longitudinal plan of care) Evaluation of current treatment plan related to hypertension self management and patient's adherence to plan as established by provider. 01-19-2021: The patient states her blood pressures were high when she was in the office because she was in pain. She states that now that her pain is under control it is fine. States she has been taking at home but could not provide numbers. Ask the patient to start writing down numbers for the next outreach so the [facility] could record numbers in the record. Will continue to monitor.  Provided education to patient re: stroke prevention, s/s of heart attack and stroke, DASH diet, complications of uncontrolled blood pressure. 01-19-2021: The patient confirms compliance with heart healthy/ADA diet. Denies any issues with appetite. Will continue to monitor for changes.  Reviewed medications with patient and discussed importance of compliance. 01-19-2021: The patient denies any issues with medications or compliance with medications Discussed plans with patient for ongoing care management follow up and provided patient with direct contact information for care management team Advised patient, providing education and rationale, to monitor blood pressure daily and record, calling PCP for findings outside established parameters.  Self-Care Activities: - Self administers  medications as prescribed Attends all scheduled provider appointments Calls provider office for new concerns, questions, or BP outside discussed parameters Checks BP and records as discussed Follows a low sodium diet/DASH diet Patient Goals: - check blood pressure weekly - choose a place to take my blood pressure (home, clinic or office, retail store) - write blood pressure results in a log or diary - agree on reward when goals are met - agree to work  together to make changes - ask questions to understand - have a family meeting to talk about healthy habits - learn about high blood pressure - blood pressure trends reviewed - depression screen reviewed - home or ambulatory blood pressure monitoring encouraged Follow Up Plan: Telephone follow up appointment with care management team member scheduled for:03-23-2021 at 1 pm     Task: RNCM: Identify and Monitor Blood Pressure Elevation Completed 01/19/2021  Outcome: Positive  Note:   Care Management Activities:    - blood pressure trends reviewed - depression screen reviewed - home or ambulatory blood pressure monitoring encouraged        Care Plan : RNCM: HLD Management  Updates made by Vanita Ingles since 01/19/2021 12:00 AM     Problem: RNCM: Management of HLD   Priority: Medium     Long-Range Goal: RNCM: Management of HLD   Start Date: 11/17/2020  Expected End Date: 11/17/2021  This Visit's Progress: On track  Priority: Medium  Note:   Current Barriers:  Poorly controlled hyperlipidemia, complicated by HTN, DM Current antihyperlipidemic regimen: Atorvastatin 20 mg QD Most recent lipid panel:     Component Value Date/Time   CHOL 140 07/01/2020 0830   CHOL 172 10/02/2016 0852   TRIG 113 07/01/2020 0830   TRIG 136 10/02/2016 0852   HDL 47 07/01/2020 0830   VLDL 27 10/02/2016 0852   LDLCALC 73 07/01/2020 0830   ASCVD risk enhancing conditions: age >88, DM, HTN Unable to independently HLD Does not maintain contact with provider office RN Care Manager Clinical Goal(s):  patient will work with [facility], providers, and care team towards execution of optimized self-health management plan patient will verbalize understanding of plan for effective management of HLD patient will work with [facility] and pcp to address needs related to effective management of HLD  Interventions: Collaboration with Jon Billings, NP regarding development and update of comprehensive plan of  care as evidenced by provider attestation and co-signature Inter-disciplinary care team collaboration (see longitudinal plan of care) Medication review performed; medication list updated in electronic medical record.  Inter-disciplinary care team collaboration (see longitudinal plan of care) Referred to pharmacy team for assistance with HLD medication management Evaluation of current treatment plan related to HLD and patient's adherence to plan as established by provider. 01-19-2021: The patient denies any issues related to HLD management. States she is eating well and adherent to heart healthy diet. Will continue to monitor for changes.  Advised patient to call the office for changes or questions  Provided education to patient re: HLD management, Heart healthy/ADA diet and working with the [facility] to effectively manage health and well being.  Discussed plans with patient for ongoing care management follow up and provided patient with direct contact information for care management team Patient Goals/Self-Care Activities: - call for medicine refill 2 or 3 days before it runs out - call if I am sick and can't take my medicine - keep a list of all the medicines I take; vitamins and herbals too - learn to read  medicine labels - use a pillbox to sort medicine - use an alarm clock or phone to remind me to take my medicine - change to whole grain breads, cereal, pasta - drink 6 to 8 glasses of water each day - eat 3 to 5 servings of fruits and vegetables each day - eat 5 or 6 small meals each day - eat fish at least once per week - fill half the plate with nonstarchy vegetables - limit fast food meals to no more than 1 per week - prepare main meal at home 3 to 5 days each week - read food labels for fat, fiber, carbohydrates and portion size - reduce red meat to 2 to 3 times a week - be open to making changes - I can manage, know and watch for signs of a heart attack - if I have chest pain, call for  help - learn about small changes that will make a big difference - learn my personal risk factors - barriers to meeting goals identified - change-talk evoked - choices provided - collaboration with team encouraged - decision-making supported - health risks reviewed - problem-solving facilitated - questions answered - readiness for change evaluated - reassurance provided - self-reflection promoted - self-reliance encouraged  Follow Up Plan: Telephone follow up appointment with care management team member scheduled for: 03-23-2021 at 1 pm      Task: RNCM: Mutually Develop and Royce Macadamia Achievement of Patient Goals Completed 01/19/2021  Outcome: Positive  Note:   Care Management Activities:    - barriers to meeting goals identified - change-talk evoked - choices provided - collaboration with team encouraged - decision-making supported - health risks reviewed - problem-solving facilitated - questions answered - readiness for change evaluated - reassurance provided - self-reflection promoted - self-reliance encouraged        Care Plan : RNCM: Chronic Pain (Adult)  Updates made by Vanita Ingles since 01/19/2021 12:00 AM     Problem: RNCM: Chronic Pain Management (Chronic Pain)   Priority: High     Long-Range Goal: RNCM: Chronic Pain Managed   Start Date: 01/19/2021  Expected End Date: 01/19/2022  This Visit's Progress: On track  Note:   Current Barriers:  Knowledge Deficits related to managing acute/chronic pain Non-adherence to scheduled provider appointments Non-adherence to prescribed medication regimen Difficulty obtaining medications Chronic Disease Management support and education needs related to chronic pain Unable to independently manage chronic back pain  Does not contact provider office for questions/concerns Nurse Case Manager Clinical Goal(s):  patient will verbalize understanding of plan for managing pain patient will work  with [place] to address  concerns about pain, alternative methods to pain control and collaboration with the pcp  patient will attend all scheduled medical appointments: sees pain specialist on 02-15-2021, saw pcp on 12-13-2020 patient will demonstrate use of different relaxation  skills and/or diversional activities to assist with pain reduction (distraction, imagery, relaxation, massage, acupressure, TENS, heat, and cold application patient will report pain at a level less than 3 to 4 on a 10-10 rating scale patient will use pharmacological and nonpharmacological pain relief strategies patient will verbalize acceptable level of pain relief and ability to engage in desired activities patient will engage in desired activities without an increase in pain level Interventions:  Collaboration with Jon Billings, NP regarding development and update of comprehensive plan of care as evidenced by provider attestation and co-signature Inter-disciplinary care team collaboration (see longitudinal plan of care) - deep breathing, relaxation and mindfulness  use promoted - effectiveness of pharmacologic therapy monitored - medication-induced side effects managed - misuse of pain medication assessed - motivation and barriers to change assessed and addressed - mutually acceptable comfort goal set - pain assessed- 01-19-2021: The patient rates her pain level at a zero today on a scale of 0-10 - pain treatment goals reviewed - premedication prior to activity encouraged Evaluation of current treatment plan related to chronic back pain  and patient's adherence to plan as established by provider. Advised patient to call the office for changes in pain level or intensity of pain and discomfort  Provided education to patient re: alternative pain methods, and discussing request by the patients daughter for the patient to have an MRI. The daughter states the shots are working for her mothers back pain but wanted to know the cause of the back  pain. Will collaborate with the pcp for recommendations.  The patient and the patients daughter advised that the patient may need to come in to see the pcp for follow up and recommendations before testing can be ordered. Patient and daughter verbalized understanding.  Reviewed medications with patient and discussed compliance  Collaborated with pcp regarding request by the patients daughter to get an order for an MRI Discussed plans with patient for ongoing care management follow up and provided patient with direct contact information for care management team Allow patient to maintain a diary of pain ratings, timing, precipitating events, medications, treatments, and what works best to relieve pain,  Refer to support groups and self-help groups Educate patient about the use of pharmacological interventions for pain management- antianxiety, antidepressants, NSAIDS, opioid analgesics,  Explain the importance of lifestyle modifications to effective pain management  Patient Goals/Self Care Activities:   Self-administers medications as prescribed Attends all scheduled provider appointments Calls pharmacy for medication refills Calls provider office for new concerns or questions Follow Up Plan: Telephone follow up appointment with care management team member scheduled for:03-23-2021 at 1 pm      Task: RNCM: Alleviate Barriers to Chronic Pain Management Completed 01/19/2021  Outcome: Positive  Note:   Care Management Activities:    - deep breathing, relaxation and mindfulness use promoted - effectiveness of pharmacologic therapy monitored - medication-induced side effects managed - misuse of pain medication assessed - motivation and barriers to change assessed and addressed - mutually acceptable comfort goal set - pain assessed - pain treatment goals reviewed - premedication prior to activity encouraged    Notes:      Plan:Telephone follow up appointment with care management team member  scheduled for:  03-23-2021 at 1 pm  [place], MSN, [facility] Mobile: [phone]

## 2021-01-19 NOTE — Patient Instructions (Signed)
Visit Information  PATIENT GOALS:  Goals Addressed             This Visit's Progress    RNCM: Manage Chronic Pain       Timeframe:  Long-Range Goal Priority:  High Start Date:   01-19-2021                          Expected End Date:   01-19-2022                    Follow Up Date 03/23/2021    - call for medicine refill 2 or 3 days before it runs out - develop a personal pain management plan - keep track of prescription refills - plan exercise or activity when pain is best controlled - prioritize tasks for the day - track times pain is worst and when it is best - track what makes the pain worse and what makes it better - work slower and less intense when having pain    Why is this important?   Day-to-day life can be hard when you have chronic pain.  Pain medicine is just one piece of the treatment puzzle.  You can try these action steps to help you manage your pain.    Notes: 01-19-2021: The patient is getting injections in her back and these are very helpful. Goes on 02-15-2021 to get another injection. The patient states that she has 0 pain today. The patients daughter on the call with the patient and is requesting a MRI to find out what is the cause of the pain. Will collaborate with the pcp for recommendations and request by the patients daughter.         The patient verbalized understanding of instructions, educational materials, and care plan provided today and declined offer to receive copy of patient instructions, educational materials, and care plan.   Telephone follow up appointment with care management team member scheduled for: 03-23-2021 at 1 pm  Alto Denver RN, MSN, CCM Community Care Coordinator [place]  [facility] Mobile: [phone]

## 2021-01-31 ENCOUNTER — Ambulatory Visit: Payer: Self-pay | Admitting: General Practice

## 2021-01-31 DIAGNOSIS — R519 Headache, unspecified: Secondary | ICD-10-CM

## 2021-01-31 DIAGNOSIS — E1159 Type 2 diabetes mellitus with other circulatory complications: Secondary | ICD-10-CM

## 2021-01-31 DIAGNOSIS — I152 Hypertension secondary to endocrine disorders: Secondary | ICD-10-CM

## 2021-01-31 DIAGNOSIS — M542 Cervicalgia: Secondary | ICD-10-CM

## 2021-01-31 NOTE — Patient Instructions (Signed)
Visit Information  PATIENT GOALS:    The patient verbalized understanding of instructions, educational materials, and care plan provided today and declined offer to receive copy of patient instructions, educational materials, and care plan.   The care management team will reach out to the patient again over the next 30 to 60 days.   Alto Denver RN, MSN, CCM Community Care Coordinator [place]  [facility] Mobile: [phone]

## 2021-01-31 NOTE — Chronic Care Management (AMB) (Signed)
Chronic Care Management   CCM RN Visit Note  01/31/2021 Name: Ellen [name] MRN: [id number] DOB: Feb 23, 1941  Subjective: Ellen [name] is a 80 y.o. year old female who is a primary care patient of Jon Billings, NP. The care management team was consulted for assistance with disease management and care coordination needs.    Engaged with patient by telephone for follow up visit in response to provider referral for case management and/or care coordination services.   Consent to Services:  The patient was given information about Chronic Care Management services, agreed to services, and gave verbal consent prior to initiation of services.  Please see initial visit note for detailed documentation.   Patient agreed to services and verbal consent obtained.   Assessment: Review of patient past medical history, allergies, medications, health status, including review of consultants reports, laboratory and other test data, was performed as part of comprehensive evaluation and provision of chronic care management services.   SDOH (Social Determinants of Health) assessments and interventions performed:    CCM Care Plan  No Known Allergies  Outpatient Encounter Medications as of 01/31/2021  Medication Sig   amLODipine (NORVASC) 5 MG tablet Take 1 tablet (5 mg total) by mouth daily.   aspirin 81 MG tablet Take 81 mg by mouth daily.   atorvastatin (LIPITOR) 20 MG tablet Take 1 tablet (20 mg total) by mouth daily.   blood glucose meter kit and supplies KIT Dispense based on patient and insurance preference. Use up to four times daily as directed. (FOR ICD-9 250.00, 250.01).   CALCIUM-MAGNESIUM-ZINC PO Take 1 tablet by mouth daily.   calcium-vitamin D 250-100 MG-UNIT tablet Take 1 tablet by mouth 2 (two) times daily. (Patient not taking: Reported on 01/10/2021)   Cholecalciferol (VITAMIN D3) 1000 units CAPS Take 2 capsules by mouth daily.    ibuprofen (ADVIL) 800 MG tablet Take 1 tablet  (800 mg total) by mouth every 8 (eight) hours as needed.   lisinopril (ZESTRIL) 20 MG tablet Take 20 mg by mouth daily.   Magnesium 500 MG TABS Take 1 tablet by mouth daily.   metFORMIN (GLUCOPHAGE) 500 MG tablet Start out taking 1 tablet (500 MG) by mouth every morning with meal, then on week two start taking 1 tablet (500 MG) by mouth in morning with meal and 1 tablet (500 MG) in evening with meal.   vitamin C (ASCORBIC ACID) 500 MG tablet Take 500 mg by mouth daily. (Patient not taking: Reported on 01/10/2021)   No facility-administered encounter medications on file as of 01/31/2021.    Patient Active Problem List   Diagnosis Date Noted   Obesity 05/31/2020   Cervicalgia 12/03/2019   Hyperlipidemia associated with type 2 diabetes mellitus ([place]) 11/08/2018   Atherosclerosis of aorta ([place]) 11/13/2017   Back pain 06/13/2017   Muscle soreness 05/16/2017   Advanced care planning/counseling discussion 04/17/2017   Hypertension associated with diabetes ([place]) 10/04/2015   Menopause 03/29/2015   Type 2 diabetes mellitus with proteinuria ([place]) 03/29/2015    Conditions to be addressed/monitored:HTN and Chronic pain (headache)  Care Plan : RNCM: Hypertension (Adult)  Updates made by Vanita Ingles since 01/31/2021 12:00 AM     Problem: RNCM: Hypertension (Hypertension)   Priority: Medium     Long-Range Goal: RNCM: Hypertension Monitored   Start Date: 11/17/2020  Expected End Date: 11/17/2021  Recent Progress: On track  Priority: Medium  Note:   Objective:  Last practice recorded BP readings:  BP Readings from Last 3  Encounters:  01/10/21 (!) 151/83  12/13/20 (!) 161/91  07/01/20 122/67    Most recent eGFR/CrCl: No results found for: EGFR  No components found for: CRCL Current Barriers:  Knowledge Deficits related to basic understanding of hypertension pathophysiology and self care management Knowledge Deficits related to understanding of medications prescribed for management of  hypertension Limited Social Support Lacks social connections Does not maintain contact with provider office Case Manager Clinical Goal(s):  patient will verbalize understanding of plan for hypertension management patient will demonstrate improved adherence to prescribed treatment plan for hypertension as evidenced by taking all medications as prescribed, monitoring and recording blood pressure as directed, adhering to low sodium/DASH diet patient will demonstrate improved health management independence as evidenced by checking blood pressure as directed and notifying PCP if SBP>150 or DBP > 90, taking all medications as prescribe, and adhering to a low sodium diet as discussed. patient will verbalize basic understanding of hypertension disease process and self health management plan as evidenced by compliance with medications, compliance with heart heatlhy/Ada diet, and working with the CCM team to effectively manage health and well being.  Interventions:  Collaboration with Jon Billings, NP regarding development and update of comprehensive plan of care as evidenced by provider attestation and co-signature Inter-disciplinary care team collaboration (see longitudinal plan of care) Evaluation of current treatment plan related to hypertension self management and patient's adherence to plan as established by provider. 01-19-2021: The patient states her blood pressures were high when she was in the office because she was in pain. She states that now that her pain is under control it is fine. States she has been taking at home but could not provide numbers. Ask the patient to start writing down numbers for the next outreach so the [facility] could record numbers in the record. Will continue to monitor. 01-31-2021: The daughter sent a secure text message asking the [facility] for recommendation on what the best thing for the patient to take for "bad headaches" , "especially when it is hot outside", with the other  medications she is currently taking. [name] that I would have the pharm D contact her for information on medications that would be safe for the patient to take. Levada Dy ask that the pharm D call her first and then she would get the patient on the phone. Will advise pharm D. Levada Dy verbalized understanding. Will continue to monitor.  Provided education to patient re: stroke prevention, s/s of heart attack and stroke, DASH diet, complications of uncontrolled blood pressure. 01-19-2021: The patient confirms compliance with heart healthy/ADA diet. Denies any issues with appetite. Will continue to monitor for changes.  Reviewed medications with patient and discussed importance of compliance. 01-19-2021: The patient denies any issues with medications or compliance with medications Discussed plans with patient for ongoing care management follow up and provided patient with direct contact information for care management team Advised patient, providing education and rationale, to monitor blood pressure daily and record, calling PCP for findings outside established parameters.  Self-Care Activities: - Self administers medications as prescribed Attends all scheduled provider appointments Calls provider office for new concerns, questions, or BP outside discussed parameters Checks BP and records as discussed Follows a low sodium diet/DASH diet Patient Goals: - check blood pressure weekly - choose a place to take my blood pressure (home, clinic or office, retail store) - write blood pressure results in a log or diary - agree on reward when goals are met - agree to work together to make changes -  ask questions to understand - have a family meeting to talk about healthy habits - learn about high blood pressure - blood pressure trends reviewed - depression screen reviewed - home or ambulatory blood pressure monitoring encouraged Follow Up Plan: Telephone follow up appointment with care management team  member scheduled for:03-23-2021 at 1 pm     Care Plan : RNCM: Chronic Pain (Adult)  Updates made by Vanita Ingles since 01/31/2021 12:00 AM     Problem: RNCM: Chronic Pain Management (Chronic Pain)   Priority: High     Long-Range Goal: RNCM: Chronic Pain Managed   Start Date: 01/19/2021  Expected End Date: 01/19/2022  This Visit's Progress: On track  Recent Progress: On track  Priority: High  Note:   Current Barriers:  Knowledge Deficits related to managing acute/chronic pain Non-adherence to scheduled provider appointments Non-adherence to prescribed medication regimen Difficulty obtaining medications Chronic Disease Management support and education needs related to chronic pain Unable to independently manage chronic back pain  Does not contact provider office for questions/concerns Nurse Case Manager Clinical Goal(s):  patient will verbalize understanding of plan for managing pain patient will work  with [place] to address concerns about pain, alternative methods to pain control and collaboration with the pcp  patient will attend all scheduled medical appointments: sees pain specialist on 02-15-2021, saw pcp on 12-13-2020 patient will demonstrate use of different relaxation  skills and/or diversional activities to assist with pain reduction (distraction, imagery, relaxation, massage, acupressure, TENS, heat, and cold application patient will report pain at a level less than 3 to 4 on a 10-10 rating scale patient will use pharmacological and nonpharmacological pain relief strategies patient will verbalize acceptable level of pain relief and ability to engage in desired activities patient will engage in desired activities without an increase in pain level Interventions:  Collaboration with Jon Billings, NP regarding development and update of comprehensive plan of care as evidenced by provider attestation and co-signature Inter-disciplinary care team collaboration (see  longitudinal plan of care) - deep breathing, relaxation and mindfulness use promoted - effectiveness of pharmacologic therapy monitored - medication-induced side effects managed - misuse of pain medication assessed - motivation and barriers to change assessed and addressed - mutually acceptable comfort goal set - pain assessed- 01-19-2021: The patient rates her pain level at a zero today on a scale of 0-10 - pain treatment goals reviewed - premedication prior to activity encouraged Evaluation of current treatment plan related to chronic back pain  and patient's adherence to plan as established by provider. 01-31-2021: The patient is experiencing "bad headaches" per her daughter Levada Dy. Levada Dy is requesting OTC recommendations for the patient to take. [name] that the [facility] would talk with the pharm D and have the pharm D reach out to the patient. Levada Dy ask if the pharm D could call her first and then she would get the patient on the phone as she did not want her getting confused. Will let pharm D know. Also will alert the scheduling care guide know so she can get the pharm D scheduled.  Advised patient to call the office for changes in pain level or intensity of pain and discomfort  Provided education to patient re: alternative pain methods, and discussing request by the patients daughter for the patient to have an MRI. The daughter states the shots are working for her mothers back pain but wanted to know the cause of the back pain. Will collaborate with the pcp for recommendations.  The  patient and the patients daughter advised that the patient may need to come in to see the pcp for follow up and recommendations before testing can be ordered. Patient and daughter verbalized understanding.  Reviewed medications with patient and discussed compliance  Collaborated with pcp regarding request by the patients daughter to get an order for an MRI Discussed plans with patient for ongoing care management  follow up and provided patient with direct contact information for care management team Allow patient to maintain a diary of pain ratings, timing, precipitating events, medications, treatments, and what works best to relieve pain,  Refer to support groups and self-help groups Educate patient about the use of pharmacological interventions for pain management- antianxiety, antidepressants, NSAIDS, opioid analgesics,  Explain the importance of lifestyle modifications to effective pain management  Patient Goals/Self Care Activities:   Self-administers medications as prescribed Attends all scheduled provider appointments Calls pharmacy for medication refills Calls provider office for new concerns or questions Follow Up Plan: Telephone follow up appointment with care management team member scheduled for:03-23-2021 at 1 pm       Plan:The care management team will reach out to the patient again over the next 30 to 60 days.  Noreene Larsson RN, MSN, [facility] Mobile: [phone]

## 2021-02-02 ENCOUNTER — Ambulatory Visit: Payer: Self-pay | Admitting: General Practice

## 2021-02-02 DIAGNOSIS — R519 Headache, unspecified: Secondary | ICD-10-CM

## 2021-02-02 DIAGNOSIS — I152 Hypertension secondary to endocrine disorders: Secondary | ICD-10-CM

## 2021-02-02 NOTE — Chronic Care Management (AMB) (Signed)
Chronic Care Management   CCM RN Visit Note  02/02/2021 Name: Ellen [name] MRN: [id number] DOB: 1941/04/12  Subjective: Ellen [name] is a 80 y.o. year old female who is a primary care patient of Jon Billings, NP. The care management team was consulted for assistance with disease management and care coordination needs.    Engaged with patient by telephone for follow up visit in response to provider referral for case management and/or care coordination services.   Consent to Services:  The patient was given information about Chronic Care Management services, agreed to services, and gave verbal consent prior to initiation of services.  Please see initial visit note for detailed documentation.   Patient agreed to services and verbal consent obtained.   Assessment: Review of patient past medical history, allergies, medications, health status, including review of consultants reports, laboratory and other test data, was performed as part of comprehensive evaluation and provision of chronic care management services.   SDOH (Social Determinants of Health) assessments and interventions performed:    CCM Care Plan  No Known Allergies  Outpatient Encounter Medications as of 02/02/2021  Medication Sig   amLODipine (NORVASC) 5 MG tablet Take 1 tablet (5 mg total) by mouth daily.   aspirin 81 MG tablet Take 81 mg by mouth daily.   atorvastatin (LIPITOR) 20 MG tablet Take 1 tablet (20 mg total) by mouth daily.   blood glucose meter kit and supplies KIT Dispense based on patient and insurance preference. Use up to four times daily as directed. (FOR ICD-9 250.00, 250.01).   CALCIUM-MAGNESIUM-ZINC PO Take 1 tablet by mouth daily.   calcium-vitamin D 250-100 MG-UNIT tablet Take 1 tablet by mouth 2 (two) times daily. (Patient not taking: Reported on 01/10/2021)   Cholecalciferol (VITAMIN D3) 1000 units CAPS Take 2 capsules by mouth daily.    ibuprofen (ADVIL) 800 MG tablet Take 1 tablet  (800 mg total) by mouth every 8 (eight) hours as needed.   lisinopril (ZESTRIL) 20 MG tablet Take 20 mg by mouth daily.   Magnesium 500 MG TABS Take 1 tablet by mouth daily.   metFORMIN (GLUCOPHAGE) 500 MG tablet Start out taking 1 tablet (500 MG) by mouth every morning with meal, then on week two start taking 1 tablet (500 MG) by mouth in morning with meal and 1 tablet (500 MG) in evening with meal.   vitamin C (ASCORBIC ACID) 500 MG tablet Take 500 mg by mouth daily. (Patient not taking: Reported on 01/10/2021)   No facility-administered encounter medications on file as of 02/02/2021.    Patient Active Problem List   Diagnosis Date Noted   Obesity 05/31/2020   Cervicalgia 12/03/2019   Hyperlipidemia associated with type 2 diabetes mellitus ([name]) 11/08/2018   Atherosclerosis of aorta ([name]) 11/13/2017   Back pain 06/13/2017   Muscle soreness 05/16/2017   Advanced care planning/counseling discussion 04/17/2017   Hypertension associated with diabetes ([place]) 10/04/2015   Menopause 03/29/2015   Type 2 diabetes mellitus with proteinuria ([name]) 03/29/2015    Conditions to be addressed/monitored:HTN and Chronic pain, headache  Care Plan : RNCM: Hypertension (Adult)  Updates made by Vanita Ingles since 02/02/2021 12:00 AM     Problem: RNCM: Hypertension (Hypertension)   Priority: Medium     Long-Range Goal: RNCM: Hypertension Monitored   Start Date: 11/17/2020  Expected End Date: 11/17/2021  This Visit's Progress: On track  Recent Progress: On track  Priority: Medium  Note:   Objective:  Last practice recorded BP readings:  BP Readings from Last 3 Encounters:  01/10/21 (!) 151/83  12/13/20 (!) 161/91  07/01/20 122/67    Most recent eGFR/CrCl: No results found for: EGFR  No components found for: CRCL Current Barriers:  Knowledge Deficits related to basic understanding of hypertension pathophysiology and self care management Knowledge Deficits related to understanding of medications  prescribed for management of hypertension Limited Social Support Lacks social connections Does not maintain contact with provider office Case Manager Clinical Goal(s):  patient will verbalize understanding of plan for hypertension management patient will demonstrate improved adherence to prescribed treatment plan for hypertension as evidenced by taking all medications as prescribed, monitoring and recording blood pressure as directed, adhering to low sodium/DASH diet patient will demonstrate improved health management independence as evidenced by checking blood pressure as directed and notifying PCP if SBP>150 or DBP > 90, taking all medications as prescribe, and adhering to a low sodium diet as discussed. patient will verbalize basic understanding of hypertension disease process and self health management plan as evidenced by compliance with medications, compliance with heart heatlhy/Ada diet, and working with the CCM team to effectively manage health and well being.  Interventions:  Collaboration with Jon Billings, NP regarding development and update of comprehensive plan of care as evidenced by provider attestation and co-signature Inter-disciplinary care team collaboration (see longitudinal plan of care) Evaluation of current treatment plan related to hypertension self management and patient's adherence to plan as established by provider. 01-19-2021: The patient states her blood pressures were high when she was in the office because she was in pain. She states that now that her pain is under control it is fine. States she has been taking at home but could not provide numbers. Ask the patient to start writing down numbers for the next outreach so the [facility] could record numbers in the record. Will continue to monitor. 01-31-2021: The daughter sent a secure text message asking the [facility] for recommendation on what the best thing for the patient to take for "bad headaches" , "especially when it is hot  outside", with the other medications she is currently taking. [name] that I would have the pharm D contact her for information on medications that would be safe for the patient to take. Levada Dy ask that the pharm D call her first and then she would get the patient on the phone. Will advise pharm D. Levada Dy verbalized understanding. Will continue to monitor. 02-02-2021: New correspondence received from the patients daughter wanting to know when she would hear back on OTC medications. Messages sent to pharm D and the pcp.  The pcp recommended that the patient take Tylenol for headache pain instead of Ibuprofen as Ibuprofen could elevate blood pressures and cause harm to the kidneys. Call to the patient and the patients daughter after collaborate with the pcp and relayed the information to the patient and the patients daughter that it was recommended by the pcp that she take Tylenol for headache pain. Verbalized understanding. Will continue to monitor.   Provided education to patient re: stroke prevention, s/s of heart attack and stroke, DASH diet, complications of uncontrolled blood pressure. 01-19-2021: The patient confirms compliance with heart healthy/ADA diet. Denies any issues with appetite. Will continue to monitor for changes.  Reviewed medications with patient and discussed importance of compliance. 01-19-2021: The patient denies any issues with medications or compliance with medications Discussed plans with patient for ongoing care management follow up and provided patient with direct contact information for care management team Advised patient,  providing education and rationale, to monitor blood pressure daily and record, calling PCP for findings outside established parameters.  Self-Care Activities: - Self administers medications as prescribed Attends all scheduled provider appointments Calls provider office for new concerns, questions, or BP outside discussed parameters Checks BP and records as  discussed Follows a low sodium diet/DASH diet Patient Goals: - check blood pressure weekly - choose a place to take my blood pressure (home, clinic or office, retail store) - write blood pressure results in a log or diary - agree on reward when goals are met - agree to work together to make changes - ask questions to understand - have a family meeting to talk about healthy habits - learn about high blood pressure - blood pressure trends reviewed - depression screen reviewed - home or ambulatory blood pressure monitoring encouraged Follow Up Plan: Telephone follow up appointment with care management team member scheduled for:03-23-2021 at 1 pm     Care Plan : RNCM: Chronic Pain (Adult)  Updates made by Vanita Ingles since 02/02/2021 12:00 AM     Problem: RNCM: Chronic Pain Management (Chronic Pain)   Priority: High     Long-Range Goal: RNCM: Chronic Pain Managed   Start Date: 01/19/2021  Expected End Date: 01/19/2022  This Visit's Progress: On track  Recent Progress: On track  Priority: High  Note:   Current Barriers:  Knowledge Deficits related to managing acute/chronic pain Non-adherence to scheduled provider appointments Non-adherence to prescribed medication regimen Difficulty obtaining medications Chronic Disease Management support and education needs related to chronic pain Unable to independently manage chronic back pain  Does not contact provider office for questions/concerns Nurse Case Manager Clinical Goal(s):  patient will verbalize understanding of plan for managing pain patient will work  with [place] to address concerns about pain, alternative methods to pain control and collaboration with the pcp  patient will attend all scheduled medical appointments: sees pain specialist on 02-15-2021, saw pcp on 12-13-2020 patient will demonstrate use of different relaxation  skills and/or diversional activities to assist with pain reduction (distraction, imagery,  relaxation, massage, acupressure, TENS, heat, and cold application patient will report pain at a level less than 3 to 4 on a 10-10 rating scale patient will use pharmacological and nonpharmacological pain relief strategies patient will verbalize acceptable level of pain relief and ability to engage in desired activities patient will engage in desired activities without an increase in pain level Interventions:  Collaboration with Jon Billings, NP regarding development and update of comprehensive plan of care as evidenced by provider attestation and co-signature Inter-disciplinary care team collaboration (see longitudinal plan of care) - deep breathing, relaxation and mindfulness use promoted - effectiveness of pharmacologic therapy monitored - medication-induced side effects managed - misuse of pain medication assessed - motivation and barriers to change assessed and addressed - mutually acceptable comfort goal set - pain assessed- 01-19-2021: The patient rates her pain level at a zero today on a scale of 0-10 - pain treatment goals reviewed - premedication prior to activity encouraged Evaluation of current treatment plan related to chronic back pain  and patient's adherence to plan as established by provider. 01-31-2021: The patient is experiencing "bad headaches" per her daughter Levada Dy. Levada Dy is requesting OTC recommendations for the patient to take. [name] that the [facility] would talk with the pharm D and have the pharm D reach out to the patient. Levada Dy ask if the pharm D could call her first and then she would get the  patient on the phone as she did not want her getting confused. Will let pharm D know. Also will alert the scheduling care guide know so she can get the pharm D scheduled. 02-02-2021: New correspondence received from the patients daughter wanting to know when she would hear back on OTC medications. Messages sent to pharm D and the pcp.  The pcp recommended that the patient  take Tylenol for headache pain instead of Ibuprofen as Ibuprofen could elevate blood pressures and cause harm to the kidneys. Call to the patient and the patients daughter after collaborate with the pcp and relayed the information to the patient and the patients daughter that it was recommended by the pcp that she take Tylenol for headache pain.  Advised patient to call the office for changes in pain level or intensity of pain and discomfort  Provided education to patient re: alternative pain methods, and discussing request by the patients daughter for the patient to have an MRI. The daughter states the shots are working for her mothers back pain but wanted to know the cause of the back pain. Will collaborate with the pcp for recommendations.  The patient and the patients daughter advised that the patient may need to come in to see the pcp for follow up and recommendations before testing can be ordered. Patient and daughter verbalized understanding.  Reviewed medications with patient and discussed compliance  Collaborated with pcp regarding request by the patients daughter to get an order for an MRI Discussed plans with patient for ongoing care management follow up and provided patient with direct contact information for care management team Allow patient to maintain a diary of pain ratings, timing, precipitating events, medications, treatments, and what works best to relieve pain,  Refer to support groups and self-help groups Educate patient about the use of pharmacological interventions for pain management- antianxiety, antidepressants, NSAIDS, opioid analgesics,  Explain the importance of lifestyle modifications to effective pain management  Patient Goals/Self Care Activities:   Self-administers medications as prescribed Attends all scheduled provider appointments Calls pharmacy for medication refills Calls provider office for new concerns or questions Follow Up Plan: Telephone follow up  appointment with care management team member scheduled for:03-23-2021 at 1 pm       Plan:Telephone follow up appointment with care management team member scheduled for:  03-23-2021  Noreene Larsson RN, MSN, [facility] Mobile: [phone]

## 2021-02-02 NOTE — Patient Instructions (Signed)
Visit Information  PATIENT GOALS:  Goals Addressed             This Visit's Progress    RNCM: Manage Chronic Pain       Timeframe:  Long-Range Goal Priority:  High Start Date:   01-19-2021                          Expected End Date:   01-19-2022                    Follow Up Date 03/23/2021    - call for medicine refill 2 or 3 days before it runs out - develop a personal pain management plan - keep track of prescription refills - plan exercise or activity when pain is best controlled - prioritize tasks for the day - track times pain is worst and when it is best - track what makes the pain worse and what makes it better - work slower and less intense when having pain    Why is this important?   Day-to-day life can be hard when you have chronic pain.  Pain medicine is just one piece of the treatment puzzle.  You can try these action steps to help you manage your pain.    Notes: 01-19-2021: The patient is getting injections in her back and these are very helpful. Goes on 02-15-2021 to get another injection. The patient states that she has 0 pain today. The patients daughter on the call with the patient and is requesting a MRI to find out what is the cause of the pain. Will collaborate with the pcp for recommendations and request by the patients daughter. 02-02-2021: The patients daughter had sent a text message asking for the best OTC medication the patient could take for headache pain she was experiencing especially when it was hot outside. The [facility] collaborated with the pcp and ask for OTC recommendations for OTC product to take that would not interfere with her other medications. Tylenol recommended by the pcp for headache vs Ibuprofen that could elevated blood pressure and harm kidneys. Information provided to the patient and the patients daughter. Verbalized understanding. Will continue to monitor.         The patient verbalized understanding of instructions, educational materials, and  care plan provided today and declined offer to receive copy of patient instructions, educational materials, and care plan.   Telephone follow up appointment with care management team member scheduled for: 03-23-2021   Alto Denver RN, MSN, CCM Community Care Coordinator   [facility] Mobile: [phone]

## 2021-02-07 ENCOUNTER — Telehealth: Payer: Self-pay

## 2021-02-07 NOTE — Chronic Care Management (AMB) (Signed)
    Chronic Care Management Pharmacy Assistant   Name: Ellen [name]  MRN: [id number] DOB: September 17, 1940    Reason for Encounter: Schedule appointment with CPP  Per CPP:Can you schedule a telephone visit with patient so I can review meds and talk about otc recommendations please?  I have attempted to reach patient 3x it was a unsuccessful out reach and I have left 3 voicemail's to return phone call as well.

## 2021-02-15 ENCOUNTER — Other Ambulatory Visit: Payer: Self-pay

## 2021-02-15 ENCOUNTER — Encounter: Payer: Self-pay | Admitting: Anesthesiology

## 2021-02-15 ENCOUNTER — Ambulatory Visit
Admission: RE | Admit: 2021-02-15 | Discharge: 2021-02-15 | Disposition: A | Discharge status: Home / Self Care | Payer: Medicare Other | Source: Ambulatory Visit | Attending: Anesthesiology | Admitting: Anesthesiology

## 2021-02-15 ENCOUNTER — Other Ambulatory Visit: Payer: Self-pay | Admitting: Anesthesiology

## 2021-02-15 ENCOUNTER — Ambulatory Visit (HOSPITAL_BASED_OUTPATIENT_CLINIC_OR_DEPARTMENT_OTHER): Payer: Medicare Other | Admitting: Anesthesiology

## 2021-02-15 VITALS — BP 158/102 | HR 104 | Temp 96.8°F | Resp 16 | Ht 62.0 in | Wt 155.0 lb

## 2021-02-15 DIAGNOSIS — M791 Myalgia, unspecified site: Secondary | ICD-10-CM

## 2021-02-15 DIAGNOSIS — M4716 Other spondylosis with myelopathy, lumbar region: Secondary | ICD-10-CM

## 2021-02-15 DIAGNOSIS — G8929 Other chronic pain: Secondary | ICD-10-CM | POA: Diagnosis not present

## 2021-02-15 DIAGNOSIS — M5136 Other intervertebral disc degeneration, lumbar region: Secondary | ICD-10-CM | POA: Insufficient documentation

## 2021-02-15 DIAGNOSIS — M545 Low back pain, unspecified: Secondary | ICD-10-CM | POA: Insufficient documentation

## 2021-02-15 DIAGNOSIS — M549 Dorsalgia, unspecified: Secondary | ICD-10-CM | POA: Diagnosis not present

## 2021-02-15 DIAGNOSIS — M542 Cervicalgia: Secondary | ICD-10-CM | POA: Insufficient documentation

## 2021-02-15 DIAGNOSIS — M48062 Spinal stenosis, lumbar region with neurogenic claudication: Secondary | ICD-10-CM | POA: Insufficient documentation

## 2021-02-15 DIAGNOSIS — R52 Pain, unspecified: Secondary | ICD-10-CM | POA: Diagnosis not present

## 2021-02-15 DIAGNOSIS — M5431 Sciatica, right side: Secondary | ICD-10-CM

## 2021-02-15 MED ORDER — ROPIVACAINE HCL 2 MG/ML IJ SOLN
INTRAMUSCULAR | Status: AC
  Filled 2021-02-15: qty 20

## 2021-02-15 MED ORDER — LIDOCAINE HCL (PF) 1 % IJ SOLN
INTRAMUSCULAR | Status: AC
  Filled 2021-02-15: qty 10

## 2021-02-15 MED ORDER — TRIAMCINOLONE ACETONIDE 40 MG/ML IJ SUSP
INTRAMUSCULAR | Status: AC
  Filled 2021-02-15: qty 1

## 2021-02-15 MED ORDER — ROPIVACAINE HCL 2 MG/ML IJ SOLN
10.0000 mL | Freq: Once | INTRAMUSCULAR | Status: AC
  Administered 2021-02-15: 20 mL via EPIDURAL

## 2021-02-15 MED ORDER — TRIAMCINOLONE ACETONIDE 40 MG/ML IJ SUSP
40.0000 mg | Freq: Once | INTRAMUSCULAR | Status: AC
  Administered 2021-02-15: 40 mg

## 2021-02-15 MED ORDER — LIDOCAINE HCL (PF) 1 % IJ SOLN
5.0000 mL | Freq: Once | INTRAMUSCULAR | Status: AC
  Administered 2021-02-15: 5 mL via SUBCUTANEOUS

## 2021-02-15 MED ORDER — SODIUM CHLORIDE (PF) 0.9 % IJ SOLN
INTRAMUSCULAR | Status: AC
  Filled 2021-02-15: qty 10

## 2021-02-15 MED ORDER — IOPAMIDOL (ISOVUE-M 200) INJECTION 41%: 20.0000 mL | Freq: Once | INTRAMUSCULAR | Status: DC | PRN

## 2021-02-15 MED ORDER — SODIUM CHLORIDE 0.9% FLUSH
10.0000 mL | Freq: Once | INTRAVENOUS | Status: AC
  Administered 2021-02-15: 10 mL

## 2021-02-15 NOTE — Progress Notes (Signed)
Safety precautions to be maintained throughout the outpatient stay will include: orient to surroundings, keep bed in low position, maintain call bell within reach at all times, provide assistance with transfer out of bed and ambulation.  

## 2021-02-15 NOTE — Patient Instructions (Signed)
Pain Management Discharge Instructions  General Discharge Instructions :  If you need to reach your doctor call: Monday-Friday 8:00 am - 4:00 pm at [phone] or toll free [phone].  After clinic hours [phone] to have operator reach doctor.  Bring all of your medication bottles to all your appointments in the pain clinic.  To cancel or reschedule your appointment with Pain Management please remember to call 24 hours in advance to avoid a fee.  Refer to the educational materials which you have been given on: General Risks, I had my Procedure. Discharge Instructions, Post Sedation.  Post Procedure Instructions:  The drugs you were given will stay in your system until tomorrow, so for the next 24 hours you should not drive, make any legal decisions or drink any alcoholic beverages.  You may eat anything you prefer, but it is better to start with liquids then soups and crackers, and gradually work up to solid foods.  Please notify your doctor immediately if you have any unusual bleeding, trouble breathing or pain that is not related to your normal pain.  Depending on the type of procedure that was done, some parts of your body may feel week and/or numb.  This usually clears up by tonight or the next day.  Walk with the use of an assistive device or accompanied by an adult for the 24 hours.  You may use ice on the affected area for the first 24 hours.  Put ice in a Ziploc bag and cover with a towel and place against area 15 minutes on 15 minutes off.  You may switch to heat after 24 hours.Epidural Steroid Injection Patient Information  Description: The epidural space surrounds the nerves as they exit the spinal cord.  In some patients, the nerves can be compressed and inflamed by a bulging disc or a tight spinal canal (spinal stenosis).  By injecting steroids into the epidural space, we can bring irritated nerves into direct contact with a potentially helpful medication.  These  steroids act directly on the irritated nerves and can reduce swelling and inflammation which often leads to decreased pain.  Epidural steroids may be injected anywhere along the spine and from the neck to the low back depending upon the location of your pain.   After numbing the skin with local anesthetic (like Novocaine), a small needle is passed into the epidural space slowly.  You may experience a sensation of pressure while this is being done.  The entire block usually last less than 10 minutes.  Conditions which may be treated by epidural steroids:  Low back and leg pain Neck and arm pain Spinal stenosis Post-laminectomy syndrome Herpes zoster (shingles) pain Pain from compression fractures  Preparation for the injection:  Do not eat any solid food or dairy products within 8 hours of your appointment.  You may drink clear liquids up to 3 hours before appointment.  Clear liquids include water, black coffee, juice or soda.  No milk or cream please. You may take your regular medication, including pain medications, with a sip of water before your appointment  Diabetics should hold regular insulin (if taken separately) and take 1/2 normal NPH dos the morning of the procedure.  Carry some sugar containing items with you to your appointment. A driver must accompany you and be prepared to drive you home after your procedure.  Bring all your current medications with your. An IV may be inserted and sedation may be given at the discretion of the physician.     A blood pressure cuff, EKG and other monitors will often be applied during the procedure.  Some patients may need to have extra oxygen administered for a short period. You will be asked to provide medical information, including your allergies, prior to the procedure.  We must know immediately if you are taking blood thinners (like Coumadin/Warfarin)  Or if you are allergic to IV iodine contrast (dye). We must know if you could possible be  pregnant.  Possible side-effects: Bleeding from needle site Infection (rare, may require surgery) Nerve injury (rare) Numbness & tingling (temporary) Difficulty urinating (rare, temporary) Spinal headache ( a headache worse with upright posture) Light -headedness (temporary) Pain at injection site (several days) Decreased blood pressure (temporary) Weakness in arm/leg (temporary) Pressure sensation in back/neck (temporary)  Call if you experience: Fever/chills associated with headache or increased back/neck pain. Headache worsened by an upright position. New onset weakness or numbness of an extremity below the injection site Hives or difficulty breathing (go to the emergency room) Inflammation or drainage at the infection site Severe back/neck pain Any new symptoms which are concerning to you  Please note:  Although the local anesthetic injected can often make your back or neck feel good for several hours after the injection, the pain will likely return.  It takes 3-7 days for steroids to work in the epidural space.  You may not notice any pain relief for at least that one week.  If effective, we will often do a series of three injections spaced 3-6 weeks apart to maximally decrease your pain.  After the initial series, we generally will wait several months before considering a repeat injection of the same type.  If you have any questions, please call [phone] [facility] 

## 2021-02-16 ENCOUNTER — Telehealth: Payer: Self-pay

## 2021-02-16 DIAGNOSIS — E1129 Type 2 diabetes mellitus with other diabetic kidney complication: Secondary | ICD-10-CM | POA: Diagnosis not present

## 2021-02-16 DIAGNOSIS — E1159 Type 2 diabetes mellitus with other circulatory complications: Secondary | ICD-10-CM

## 2021-02-16 DIAGNOSIS — E782 Mixed hyperlipidemia: Secondary | ICD-10-CM | POA: Diagnosis not present

## 2021-02-16 DIAGNOSIS — I152 Hypertension secondary to endocrine disorders: Secondary | ICD-10-CM | POA: Diagnosis not present

## 2021-02-16 DIAGNOSIS — R809 Proteinuria, unspecified: Secondary | ICD-10-CM | POA: Diagnosis not present

## 2021-02-16 NOTE — Progress Notes (Signed)
Subjective:  Patient ID: Ellen [name], female    DOB: [birth year]  Age: 80 y.o. MRN: [id number]  CC: Back Pain (Lumbar bilateral worse on the right )   Procedure: L5-S1 epidural steroid under fluoroscopic guidance with no sedation  HPI [name] presents for reevaluation.  [name] was last seen July 25 at which point she had an epidural steroid injection.  She reports that she got good relief with this and has had some recurrence of some lower extremity pain  Outpatient Medications Prior to Visit  Medication Sig Dispense Refill   amLODipine (NORVASC) 5 MG tablet Take 1 tablet (5 mg total) by mouth daily. 90 tablet 4   aspirin 81 MG tablet Take 81 mg by mouth daily.     atorvastatin (LIPITOR) 20 MG tablet Take 1 tablet (20 mg total) by mouth daily. 90 tablet 4   blood glucose meter kit and supplies KIT Dispense based on patient and insurance preference. Use up to four times daily as directed. (FOR ICD-9 250.00, 250.01). 1 each 0   CALCIUM-MAGNESIUM-ZINC PO Take 1 tablet by mouth daily.     calcium-vitamin D 250-100 MG-UNIT tablet Take 1 tablet by mouth 2 (two) times daily.     Cholecalciferol (VITAMIN D3) 1000 units CAPS Take 2 capsules by mouth daily.      ibuprofen (ADVIL) 800 MG tablet Take 1 tablet (800 mg total) by mouth every 8 (eight) hours as needed. 30 tablet 0   lisinopril (ZESTRIL) 20 MG tablet Take 20 mg by mouth daily.     Magnesium 500 MG TABS Take 1 tablet by mouth daily.     metFORMIN (GLUCOPHAGE) 500 MG tablet Start out taking 1 tablet (500 MG) by mouth every morning with meal, then on week two start taking 1 tablet (500 MG) by mouth in morning with meal and 1 tablet (500 MG) in evening with meal. 180 tablet 3   cyclobenzaprine (FLEXERIL) 10 MG tablet Take 10 mg by mouth 2 (two) times daily as needed.     vitamin C (ASCORBIC ACID) 500 MG tablet Take 500 mg by mouth daily. (Patient not taking: No sig reported)     No facility-administered medications prior to  visit.    Review of Systems CNS: No confusion or sedation Cardiac: No angina or palpitations GI: No abdominal pain or constipation Constitutional: No nausea vomiting fevers or chills  Objective:  BP (!) 158/102   Pulse (!) 104   Temp (!) 96.8 F (36 C) (Temporal)   Resp 16   Ht '5\' 2"'  (1.575 m)   Wt 155 lb (70.3 kg)   LMP 11/28/1994 (Approximate)   SpO2 100%   BMI 28.35 kg/m    BP Readings from Last 3 Encounters:  02/15/21 (!) 158/102  01/10/21 (!) 151/83  12/13/20 (!) 161/91     Wt Readings from Last 3 Encounters:  02/15/21 155 lb (70.3 kg)  01/10/21 155 lb (70.3 kg)  12/13/20 151 lb 9.6 oz (68.8 kg)     Physical Exam Pt is alert and oriented PERRL EOMI HEART IS RRR no murmur or rub LCTA no wheezing or rales MUSCULOSKELETAL she continues to have a slightly antalgic gait with no change in lower extremity muscle tone or bulk.  Improvement in straight leg raising right side.  Overall strength appears to be at baseline  Labs  Lab Results  Component Value Date   HGBA1C 6.5 (H) 05/31/2020   HGBA1C 6.5 (H) 11/19/2019   HGBA1C 6.5 (H) 05/21/2019  Lab Results  Component Value Date   MICROALBUR 30 (H) 05/31/2020   LDLCALC 73 07/01/2020   CREATININE 0.83 07/01/2020    -------------------------------------------------------------------------------------------------------------------- Lab Results  Component Value Date   WBC 4.6 05/31/2020   HGB 12.6 05/31/2020   HCT 39.5 05/31/2020   PLT 278 05/31/2020   GLUCOSE 96 07/01/2020   CHOL 140 07/01/2020   TRIG 113 07/01/2020   HDL 47 07/01/2020   LDLCALC 73 07/01/2020   ALT 22 05/31/2020   AST 26 05/31/2020   NA 142 07/01/2020   K 3.5 08/02/2020   CL 98 07/01/2020   CREATININE 0.83 07/01/2020   BUN 15 07/01/2020   CO2 27 07/01/2020   TSH 1.770 05/31/2020   HGBA1C 6.5 (H) 05/31/2020   MICROALBUR 30 (H) 05/31/2020     --------------------------------------------------------------------------------------------------------------------- DG PAIN CLINIC C-ARM 1-60 MIN NO REPORT  Result Date: 02/15/2021 Fluoro was used, but no Radiologist interpretation will be provided. Please refer to "NOTES" tab for provider progress note.    Assessment & Plan:   Breshay was seen today for back pain.  Diagnoses and all orders for this visit:  Spinal stenosis of lumbar region with neurogenic claudication  DDD (degenerative disc disease), lumbar  Sciatica of right side  Spondylosis, lumbar, with myelopathy  Chronic bilateral low back pain without sciatica  Mid back pain on right side  Muscle soreness  Cervicalgia  Other orders -     triamcinolone acetonide (KENALOG-40) injection 40 mg -     sodium chloride flush (NS) 0.9 % injection 10 mL -     ropivacaine (PF) 2 mg/mL (0.2%) (NAROPIN) injection 10 mL -     lidocaine (PF) (XYLOCAINE) 1 % injection 5 mL -     iopamidol (ISOVUE-M) 41 % intrathecal injection 20 mL        ----------------------------------------------------------------------------------------------------------------------  Problem List Items Addressed This Visit       Unprioritized   Back pain   Relevant Medications   cyclobenzaprine (FLEXERIL) 10 MG tablet   Cervicalgia   Muscle soreness   Other Visit Diagnoses     Spinal stenosis of lumbar region with neurogenic claudication    -  Primary   Relevant Medications   cyclobenzaprine (FLEXERIL) 10 MG tablet   triamcinolone acetonide (KENALOG-40) injection 40 mg (Completed)   ropivacaine (PF) 2 mg/mL (0.2%) (NAROPIN) injection 10 mL (Completed)   lidocaine (PF) (XYLOCAINE) 1 % injection 5 mL (Completed)   DDD (degenerative disc disease), lumbar       Relevant Medications   cyclobenzaprine (FLEXERIL) 10 MG tablet   triamcinolone acetonide (KENALOG-40) injection 40 mg (Completed)   Sciatica of right side       Relevant  Medications   cyclobenzaprine (FLEXERIL) 10 MG tablet   Spondylosis, lumbar, with myelopathy       Relevant Medications   cyclobenzaprine (FLEXERIL) 10 MG tablet   triamcinolone acetonide (KENALOG-40) injection 40 mg (Completed)   Mid back pain on right side       Relevant Medications   cyclobenzaprine (FLEXERIL) 10 MG tablet   triamcinolone acetonide (KENALOG-40) injection 40 mg (Completed)         ----------------------------------------------------------------------------------------------------------------------  1. Spinal stenosis of lumbar region with neurogenic claudication We will proceed with a repeat epidural today.  She is done well with her preceding epidurals and continues to have some low back pain but significant provement in the radiculitis.  Overall her pain is approximately 50% improved prior to her first injection back in July.  I want  her to continue efforts at stretching strengthening and core strengthening as reviewed today.  We will schedule her for return to clinic in approximately 2 months.  2. DDD (degenerative disc disease), lumbar As above  3. Sciatica of right side As above  4. Spondylosis, lumbar, with myelopathy   5. Chronic bilateral low back pain without sciatica   6. Mid back pain on right side   7. Muscle soreness We have talked about utilization of some physical therapy exercises in addition to a roller for her lumbar spine and some stretches to see if we can help with some of the spasming in the low back.  We have also talked about efforts at a TENS unit placement to help with some of the muscular component of her low back pain.  8. Cervicalgia We recommended sleeping with an orthotic pillow and avoidance of overhead activity with some stretching exercises.    ----------------------------------------------------------------------------------------------------------------------  I am having Royetta Crochet. Stefanko maintain her aspirin,  Vitamin D3, Magnesium, calcium-vitamin D, vitamin C, CALCIUM-MAGNESIUM-ZINC PO, amLODipine, atorvastatin, metFORMIN, blood glucose meter kit and supplies, lisinopril, ibuprofen, and cyclobenzaprine. We administered triamcinolone acetonide, sodium chloride flush, ropivacaine (PF) 2 mg/mL (0.2%), and lidocaine (PF).   Meds ordered this encounter  Medications   triamcinolone acetonide (KENALOG-40) injection 40 mg   sodium chloride flush (NS) 0.9 % injection 10 mL   ropivacaine (PF) 2 mg/mL (0.2%) (NAROPIN) injection 10 mL   lidocaine (PF) (XYLOCAINE) 1 % injection 5 mL   iopamidol (ISOVUE-M) 41 % intrathecal injection 20 mL   Patient's Medications  New Prescriptions   No medications on file  Previous Medications   AMLODIPINE (NORVASC) 5 MG TABLET    Take 1 tablet (5 mg total) by mouth daily.   ASPIRIN 81 MG TABLET    Take 81 mg by mouth daily.   ATORVASTATIN (LIPITOR) 20 MG TABLET    Take 1 tablet (20 mg total) by mouth daily.   BLOOD GLUCOSE METER KIT AND SUPPLIES KIT    Dispense based on patient and insurance preference. Use up to four times daily as directed. (FOR ICD-9 250.00, 250.01).   CALCIUM-MAGNESIUM-ZINC PO    Take 1 tablet by mouth daily.   CALCIUM-VITAMIN D 250-100 MG-UNIT TABLET    Take 1 tablet by mouth 2 (two) times daily.   CHOLECALCIFEROL (VITAMIN D3) 1000 UNITS CAPS    Take 2 capsules by mouth daily.    CYCLOBENZAPRINE (FLEXERIL) 10 MG TABLET    Take 10 mg by mouth 2 (two) times daily as needed.   IBUPROFEN (ADVIL) 800 MG TABLET    Take 1 tablet (800 mg total) by mouth every 8 (eight) hours as needed.   LISINOPRIL (ZESTRIL) 20 MG TABLET    Take 20 mg by mouth daily.   MAGNESIUM 500 MG TABS    Take 1 tablet by mouth daily.   METFORMIN (GLUCOPHAGE) 500 MG TABLET    Start out taking 1 tablet (500 MG) by mouth every morning with meal, then on week two start taking 1 tablet (500 MG) by mouth in morning with meal and 1 tablet (500 MG) in evening with meal.   VITAMIN C (ASCORBIC  ACID) 500 MG TABLET    Take 500 mg by mouth daily.  Modified Medications   No medications on file  Discontinued Medications   No medications on file   ----------------------------------------------------------------------------------------------------------------------  Follow-up: Return in about 2 months (around 04/17/2021) for evaluation, med refill.   Procedure: L5-S1 LESI with fluoroscopic guidance and  no moderate sedation  NOTE: The risks, benefits, and expectations of the procedure have been discussed and explained to the patient who was understanding and in agreement with suggested treatment plan. No guarantees were made.  DESCRIPTION OF PROCEDURE: Lumbar epidural steroid injection with no IV Versed, EKG, blood pressure, pulse, and pulse oximetry monitoring. The procedure was performed with the patient in the prone position under fluoroscopic guidance.  Sterile prep x3 was initiated and I then injected subcutaneous lidocaine to the overlying L5-S1 site after its fluoroscopic identifictation.  Using strict aseptic technique, I then advanced an 18-gauge Tuohy epidural needle in the midline using interlaminar approach via loss-of-resistance to saline technique. There was negative aspiration for heme or  CSF.  I then confirmed position with both AP and Lateral fluoroscan.  2 cc of contrast dye were injected and a  total of 5 mL of Preservative-Free normal saline mixed with 40 mg of Kenalog and 1cc Ropicaine 0.2 percent were injected incrementally via the  epidurally placed needle. The needle was removed. The patient tolerated the injection well and was convalesced and discharged to home in stable condition. Should the patient have any post procedure difficulty they have been instructed on how to contact us for assistance.    Molli Barrows, MD

## 2021-02-18 ENCOUNTER — Encounter: Payer: Self-pay | Admitting: Anesthesiology

## 2021-02-23 ENCOUNTER — Other Ambulatory Visit: Payer: Self-pay

## 2021-02-23 ENCOUNTER — Encounter: Payer: Self-pay | Admitting: Nurse Practitioner

## 2021-02-23 ENCOUNTER — Ambulatory Visit (INDEPENDENT_AMBULATORY_CARE_PROVIDER_SITE_OTHER): Payer: Medicare Other | Admitting: Nurse Practitioner

## 2021-02-23 VITALS — BP 162/84 | HR 93 | Temp 98.2°F | Ht 62.01 in | Wt 142.0 lb

## 2021-02-23 DIAGNOSIS — E1129 Type 2 diabetes mellitus with other diabetic kidney complication: Secondary | ICD-10-CM

## 2021-02-23 DIAGNOSIS — E1159 Type 2 diabetes mellitus with other circulatory complications: Secondary | ICD-10-CM

## 2021-02-23 DIAGNOSIS — R809 Proteinuria, unspecified: Secondary | ICD-10-CM

## 2021-02-23 DIAGNOSIS — I152 Hypertension secondary to endocrine disorders: Secondary | ICD-10-CM

## 2021-02-23 DIAGNOSIS — R634 Abnormal weight loss: Secondary | ICD-10-CM | POA: Diagnosis not present

## 2021-02-23 LAB — MICROALBUMIN, URINE WAIVED
Creatinine, Urine Waived: 10 mg/dL (ref 10–300)
Microalb, Ur Waived: 10 mg/L (ref 0–19)
Microalb/Creat Ratio: <30 mg/g (ref <30)

## 2021-02-23 MED ORDER — AMLODIPINE BESYLATE 10 MG PO TABS: 10.0000 mg | ORAL_TABLET | Freq: Every day | ORAL | 0 refills | Status: DC

## 2021-02-23 NOTE — Assessment & Plan Note (Signed)
Labs ordered today.  Patient has lost about 13lbs in 2 weeks. Last A1c was 6.5 in December 2021. Will make further recommendations based on lab results.

## 2021-02-23 NOTE — Progress Notes (Signed)
BP (!) 162/84   Pulse 93   Temp 98.2 F (36.8 C) (Oral)   Ht 5' 2.01" (1.575 m)   Wt 142 lb (64.4 kg)   LMP 11/28/1994 (Approximate)   SpO2 99%   BMI 25.97 kg/m    Subjective:    Patient ID: Ellen [name], female    DOB: 1940-12-19, 80 y.o.   MRN: [id number]  HPI: ROOSEVELT BISHER is a 80 y.o. female  Chief Complaint  Patient presents with   Hypertension   Headache    Has been having headaches for past 2 weeks in morning and gradually go away through out hte day   HYPERTENSION Hypertension status: uncontrolled  Satisfied with current treatment? yes Duration of hypertension: years BP monitoring frequency:  not checking BP range:  BP medication side effects:  no Medication compliance: excellent compliance Previous BP meds:amlodipine and lisinopril Aspirin: no Recurrent headaches: yes Visual changes: no Palpitations: no Dyspnea: no Chest pain: no Lower extremity edema: no Dizzy/lightheaded: no  Patient states she is having headaches every morning.  She states she is losing weight and not sure why because she is still eating the same.  Patient has lost about 13lbs in about 2 weeks.   Relevant past medical, surgical, family and social history reviewed and updated as indicated. Interim medical history since our last visit reviewed. Allergies and medications reviewed and updated.  Review of Systems  Constitutional:  Positive for unexpected weight change.  Eyes:  Negative for visual disturbance.  Respiratory:  Negative for cough, chest tightness and shortness of breath.   Cardiovascular:  Negative for chest pain, palpitations and leg swelling.  Neurological:  Negative for dizziness and headaches.   Per HPI unless specifically indicated above     Objective:    BP (!) 162/84   Pulse 93   Temp 98.2 F (36.8 C) (Oral)   Ht 5' 2.01" (1.575 m)   Wt 142 lb (64.4 kg)   LMP 11/28/1994 (Approximate)   SpO2 99%   BMI 25.97 kg/m   Wt Readings from Last 3  Encounters:  02/23/21 142 lb (64.4 kg)  02/15/21 155 lb (70.3 kg)  01/10/21 155 lb (70.3 kg)    Physical Exam Vitals and nursing note reviewed.  Constitutional:      General: She is not in acute distress.    Appearance: Normal appearance. She is normal weight. She is not ill-appearing, toxic-appearing or diaphoretic.  HENT:     Head: Normocephalic.     Right Ear: External ear normal.     Left Ear: External ear normal.     Nose: Nose normal.     Mouth/Throat:     Mouth: Mucous membranes are moist.     Pharynx: Oropharynx is clear.  Eyes:     General:        Right eye: No discharge.        Left eye: No discharge.     Extraocular Movements: Extraocular movements intact.     Conjunctiva/sclera: Conjunctivae normal.     Pupils: Pupils are equal, round, and reactive to light.  Cardiovascular:     Rate and Rhythm: Normal rate and regular rhythm.     Heart sounds: No murmur heard. Pulmonary:     Effort: Pulmonary effort is normal. No respiratory distress.     Breath sounds: Normal breath sounds. No wheezing or rales.  Musculoskeletal:     Cervical back: Normal range of motion and neck supple.  Skin:    General:  Skin is warm and dry.     Capillary Refill: Capillary refill takes less than 2 seconds.  Neurological:     General: No focal deficit present.     Mental Status: She is alert and oriented to person, place, and time. Mental status is at baseline.  Psychiatric:        Mood and Affect: Mood normal.        Behavior: Behavior normal.        Thought Content: Thought content normal.        Judgment: Judgment normal.    Results for orders placed or performed in visit on 08/02/20  Potassium  Result Value Ref Range   Potassium 3.5 3.5 - 5.2 mmol/L      Assessment & Plan:   Problem List Items Addressed This Visit       Cardiovascular and Mediastinum   Hypertension associated with diabetes ([place])    Chronic. Uncontrolled. Will increase Amlodipine from 41m to 143mto help  with blood pressure control. Side effects and benefits discussed with patient during visit. Follow up in 2 weeks.       Relevant Medications   amLODipine (NORVASC) 10 MG tablet   Other Relevant Orders   Comp Met (CMET)     Endocrine   Type 2 diabetes mellitus with proteinuria ([place]    Labs ordered today.  Patient has lost about 13lbs in 2 weeks. Last A1c was 6.5 in December 2021. Will make further recommendations based on lab results.       Relevant Orders   Comp Met (CMET)   HgB A1c   Microalbumin, Urine Waived   Other Visit Diagnoses     Weight loss       Labs ordered today to evaluate for weight loss. Recommend Boost/Ensure to help with increased protein. Reviewed diet recommendations with pt and daughter.   Relevant Orders   Comp Met (CMET)   Microalbumin, Urine Waived   Thyroid Panel With TSH   CBC w/Diff        Follow up plan: Return in about 2 months (around 04/25/2021) for Weight loss.

## 2021-02-23 NOTE — Addendum Note (Signed)
Addended by: Mortimer Fries on: 02/23/2021 08:32 AM   Modules accepted: Orders

## 2021-02-23 NOTE — Assessment & Plan Note (Signed)
Chronic. Uncontrolled. Will increase Amlodipine from 5mg  to 10mg  to help with blood pressure control. Side effects and benefits discussed with patient during visit. Follow up in 2 weeks.

## 2021-02-24 LAB — COMPREHENSIVE METABOLIC PANEL
ALT: 11 IU/L (ref 0–32)
AST: 16 IU/L (ref 0–40)
Albumin/Globulin Ratio: 1.8 (ref 1.2–2.2)
Albumin: 4.3 g/dL (ref 3.7–4.7)
Alkaline Phosphatase: 98 IU/L (ref 44–121)
BUN/Creatinine Ratio: 24 (ref 12–28)
BUN: 15 mg/dL (ref 8–27)
Bilirubin Total: 0.2 mg/dL (ref 0.0–1.2)
CO2: 24 mmol/L (ref 20–29)
Calcium: 9.8 mg/dL (ref 8.7–10.3)
Chloride: 101 mmol/L (ref 96–106)
Creatinine, Ser: 0.62 mg/dL (ref 0.57–1.00)
Globulin, Total: 2.4 g/dL (ref 1.5–4.5)
Glucose: 95 mg/dL (ref 65–99)
Potassium: 3.6 mmol/L (ref 3.5–5.2)
Sodium: 141 mmol/L (ref 134–144)
Total Protein: 6.7 g/dL (ref 6.0–8.5)
eGFR: 92 mL/min/{1.73_m2} (ref >59)

## 2021-02-24 LAB — CBC WITH DIFFERENTIAL/PLATELET
Basophils Absolute: 0 10*3/uL (ref 0.0–0.2)
Basos: 1 %
EOS (ABSOLUTE): 0.2 10*3/uL (ref 0.0–0.4)
Eos: 2 %
Hematocrit: 38.7 % (ref 34.0–46.6)
Hemoglobin: 12.1 g/dL (ref 11.1–15.9)
Immature Grans (Abs): 0 10*3/uL (ref 0.0–0.1)
Immature Granulocytes: 0 %
Lymphocytes Absolute: 2.1 10*3/uL (ref 0.7–3.1)
Lymphs: 30 %
MCH: 27.1 pg (ref 26.6–33.0)
MCHC: 31.3 g/dL — ABNORMAL LOW (ref 31.5–35.7)
MCV: 87 fL (ref 79–97)
Monocytes Absolute: 0.6 10*3/uL (ref 0.1–0.9)
Monocytes: 8 %
Neutrophils Absolute: 4 10*3/uL (ref 1.4–7.0)
Neutrophils: 59 %
Platelets: 362 10*3/uL (ref 150–450)
RBC: 4.46 x10E6/uL (ref 3.77–5.28)
RDW: 15.3 % (ref 11.7–15.4)
WBC: 6.9 10*3/uL (ref 3.4–10.8)

## 2021-02-24 LAB — THYROID PANEL WITH TSH
Free Thyroxine Index: 2.1 (ref 1.2–4.9)
T3 Uptake Ratio: 24 % (ref 24–39)
T4, Total: 8.9 ug/dL (ref 4.5–12.0)
TSH: 1.62 u[IU]/mL (ref 0.450–4.500)

## 2021-02-24 LAB — HEMOGLOBIN A1C
Est. average glucose Bld gHb Est-mCnc: 126 mg/dL
Hgb A1c MFr Bld: 6 % — ABNORMAL HIGH (ref 4.8–5.6)

## 2021-02-24 NOTE — Progress Notes (Signed)
Please call patient and let her know that her blood work from yesterday looks great.  There is no concern with her diabetes.  It is well controlled with an A1c of 6.0.  The protein in her urine has also improved.  These are not the cause of her weight loss. Her thyroid labs are also normal. Her lab work does not explain the weight loss. I would like patient to try and drink 2-3 protein drinks daily and keep a log of the food and drinks she is consuming over the next two weeks.  I would like to move her appointment up to 2 weeks instead of 1 month so we can check her weight and I can see what she has been eating.  Please assist patient in moving up her appointment.   Please let me know if she has any questions.

## 2021-03-08 NOTE — Progress Notes (Signed)
BP (!) 144/82   Pulse 99   Temp 98.7 F (37.1 C) (Oral)   Wt 144 lb 3.2 oz (65.4 kg)   LMP 11/28/1994 (Approximate)   SpO2 98%   BMI 26.37 kg/m    Subjective:    Patient ID: Ellen [name], female    DOB: [birth year], 80 y.o.   MRN: [id number]  HPI: Ellen [name] is a 80 y.o. female  Chief Complaint  Patient presents with   Follow-up    Patient states she is here for a follow up on her blood pressure and discuss her weight loss.    FOLLOW UP ON WEIGHT LOSS Patient states she has gained about 2lbs since her last visit.  Patient's daughter is concerned that the patient has only gained 2lbs in two weeks. She was thinking it would be more.  Wants to know what patient can do to keep up weight gain.  Wants to know if more tests need to be done to make sure nothing further is going on. Patient states she is feeling better today.  She was not eating prior to last visit because she was having back pain. Denies lightheadedness and dizziness.   Relevant past medical, surgical, family and social history reviewed and updated as indicated. Interim medical history since our last visit reviewed. Allergies and medications reviewed and updated.  Review of Systems  Constitutional:  Positive for appetite change.  Neurological:  Negative for dizziness and light-headedness.   Per HPI unless specifically indicated above     Objective:    BP (!) 144/82   Pulse 99   Temp 98.7 F (37.1 C) (Oral)   Wt 144 lb 3.2 oz (65.4 kg)   LMP 11/28/1994 (Approximate)   SpO2 98%   BMI 26.37 kg/m   Wt Readings from Last 3 Encounters:  03/09/21 144 lb 3.2 oz (65.4 kg)  02/23/21 142 lb (64.4 kg)  02/15/21 155 lb (70.3 kg)    Physical Exam Vitals and nursing note reviewed.  Constitutional:      General: She is not in acute distress.    Appearance: Normal appearance. She is normal weight. She is not ill-appearing, toxic-appearing or diaphoretic.  HENT:     Head: Normocephalic.     Right Ear:  External ear normal.     Left Ear: External ear normal.     Nose: Nose normal.     Mouth/Throat:     Mouth: Mucous membranes are moist.     Pharynx: Oropharynx is clear.  Eyes:     General:        Right eye: No discharge.        Left eye: No discharge.     Extraocular Movements: Extraocular movements intact.     Conjunctiva/sclera: Conjunctivae normal.     Pupils: Pupils are equal, round, and reactive to light.  Cardiovascular:     Rate and Rhythm: Normal rate and regular rhythm.     Heart sounds: No murmur heard. Pulmonary:     Effort: Pulmonary effort is normal. No respiratory distress.     Breath sounds: Normal breath sounds. No wheezing or rales.  Musculoskeletal:     Cervical back: Normal range of motion and neck supple.  Skin:    General: Skin is warm and dry.     Capillary Refill: Capillary refill takes less than 2 seconds.  Neurological:     General: No focal deficit present.     Mental Status: She is alert and oriented to person,  place, and time. Mental status is at baseline.  Psychiatric:        Mood and Affect: Mood normal.        Behavior: Behavior normal.        Thought Content: Thought content normal.        Judgment: Judgment normal.    Results for orders placed or performed in visit on 02/23/21  Comp Met (CMET)  Result Value Ref Range   Glucose 95 65 - 99 mg/dL   BUN 15 8 - 27 mg/dL   Creatinine, Ser 0.62 0.57 - 1.00 mg/dL   eGFR 92 >59 mL/min/1.73   BUN/Creatinine Ratio 24 12 - 28   Sodium 141 134 - 144 mmol/L   Potassium 3.6 3.5 - 5.2 mmol/L   Chloride 101 96 - 106 mmol/L   CO2 24 20 - 29 mmol/L   Calcium 9.8 8.7 - 10.3 mg/dL   Total Protein 6.7 6.0 - 8.5 g/dL   Albumin 4.3 3.7 - 4.7 g/dL   Globulin, Total 2.4 1.5 - 4.5 g/dL   Albumin/Globulin Ratio 1.8 1.2 - 2.2   Bilirubin Total 0.2 0.0 - 1.2 mg/dL   Alkaline Phosphatase 98 44 - 121 IU/L   AST 16 0 - 40 IU/L   ALT 11 0 - 32 IU/L  HgB A1c  Result Value Ref Range   Hgb A1c MFr Bld 6.0 (H) 4.8  - 5.6 %   Est. average glucose Bld gHb Est-mCnc 126 mg/dL  Thyroid Panel With TSH  Result Value Ref Range   TSH 1.620 0.450 - 4.500 uIU/mL   T4, Total 8.9 4.5 - 12.0 ug/dL   T3 Uptake Ratio 24 24 - 39 %   Free Thyroxine Index 2.1 1.2 - 4.9  CBC w/Diff  Result Value Ref Range   WBC 6.9 3.4 - 10.8 x10E3/uL   RBC 4.46 3.77 - 5.28 x10E6/uL   Hemoglobin 12.1 11.1 - 15.9 g/dL   Hematocrit 38.7 34.0 - 46.6 %   MCV 87 79 - 97 fL   MCH 27.1 26.6 - 33.0 pg   MCHC 31.3 (L) 31.5 - 35.7 g/dL   RDW 15.3 11.7 - 15.4 %   Platelets 362 150 - 450 x10E3/uL   Neutrophils 59 Not Estab. %   Lymphs 30 Not Estab. %   Monocytes 8 Not Estab. %   Eos 2 Not Estab. %   Basos 1 Not Estab. %   Neutrophils Absolute 4.0 1.4 - 7.0 x10E3/uL   Lymphocytes Absolute 2.1 0.7 - 3.1 x10E3/uL   Monocytes Absolute 0.6 0.1 - 0.9 x10E3/uL   EOS (ABSOLUTE) 0.2 0.0 - 0.4 x10E3/uL   Basophils Absolute 0.0 0.0 - 0.2 x10E3/uL   Immature Granulocytes 0 Not Estab. %   Immature Grans (Abs) 0.0 0.0 - 0.1 x10E3/uL  Microalbumin, Urine Waived  Result Value Ref Range   Microalb, Ur Waived 10 0 - 19 mg/L   Creatinine, Urine Waived 10 10 - 300 mg/dL   Microalb/Creat Ratio <30 <30 mg/g      Assessment & Plan:   Problem List Items Addressed This Visit       Other   Weight loss - Primary    Lengthy discussion had with patient and her daughter who was on the phone during visit about the importance of prioritizing protein, decreasing carbohydrate intake and continuing with the Glucerna shakes.  Still recommend patient drink 2 protein shakes daily. Discussed high protein foods, increasing dairy to full fat, and that Carbohydrates can be  filling but not as useful for weight gain. Information given to patient about high protein diet.  Follow up in 1 month for reevaluation.       Other Visit Diagnoses     Flu vaccine need       Relevant Orders   Flu Vaccine QUAD High Dose(Fluad) (Completed)        Follow up plan: Return in  about 1 month (around 04/08/2021) for HTN, HLD, DM2 FU.  A total of 30 minutes were spent on this encounter today.  When total time is documented, this includes both the face-to-face and non-face-to-face time personally spent before, during and after the visit on the date of the encounter discussing proper food choices to aid in weight gain and reviewing labs from previous visit.

## 2021-03-09 ENCOUNTER — Other Ambulatory Visit: Payer: Self-pay

## 2021-03-09 ENCOUNTER — Ambulatory Visit (INDEPENDENT_AMBULATORY_CARE_PROVIDER_SITE_OTHER): Payer: Medicare Other | Admitting: Nurse Practitioner

## 2021-03-09 ENCOUNTER — Encounter: Payer: Self-pay | Admitting: Nurse Practitioner

## 2021-03-09 VITALS — BP 144/82 | HR 99 | Temp 98.7°F | Wt 144.2 lb

## 2021-03-09 DIAGNOSIS — Z23 Encounter for immunization: Secondary | ICD-10-CM

## 2021-03-09 DIAGNOSIS — R634 Abnormal weight loss: Secondary | ICD-10-CM | POA: Insufficient documentation

## 2021-03-09 NOTE — Assessment & Plan Note (Addendum)
Lengthy discussion had with patient and her daughter who was on the phone during visit about the importance of prioritizing protein, decreasing carbohydrate intake and continuing with the Glucerna shakes.  Still recommend patient drink 2 protein shakes daily. Discussed high protein foods, increasing dairy to full fat, and that Carbohydrates can be filling but not as useful for weight gain. Information given to patient about high protein diet.  Goal is to gain 1lb  Per week.  Follow up in 1 month for reevaluation.

## 2021-03-21 ENCOUNTER — Telehealth: Payer: Self-pay

## 2021-03-21 NOTE — Telephone Encounter (Signed)
Pt needs a refill cyclobenazprine

## 2021-03-21 NOTE — Telephone Encounter (Signed)
Returned patient phone call to let her know that it does not look as if Dr Pernell Dupre is prescribing this medication.  I asked if she would please check her bottle to determine who is prescribing.

## 2021-03-23 ENCOUNTER — Telehealth: Payer: Self-pay

## 2021-04-07 NOTE — Progress Notes (Signed)
BP 132/78   Pulse 89   Temp 98.3 F (36.8 C) (Oral)   Wt 148 lb 3.2 oz (67.2 kg)   LMP 11/28/1994 (Approximate)   SpO2 100%   BMI 27.10 kg/m    Subjective:    Patient ID: Ellen [name], female    DOB: Feb 19, 1941, 80 y.o.   MRN: [id number]  HPI: Ellen [name] is a 80 y.o. female  Chief Complaint  Patient presents with   Diabetes    Patient denies having a recent Diabetic Eye Exam.    Hyperlipidemia   Hypertension   Medication Refill    Patient is requesting refills on medications.    HYPERTENSION / HYPERLIPIDEMIA Satisfied with current treatment? no Duration of hypertension: years BP monitoring frequency: weekly BP range: 120-130 BP medication side effects: no Past BP meds: amlodipine and lisinopril Duration of hyperlipidemia: years Cholesterol medication side effects: no Cholesterol supplements: none Past cholesterol medications: atorvastain (lipitor) Medication compliance: excellent compliance Aspirin: yes Recent stressors: no Recurrent headaches: no Visual changes: no Palpitations: no Dyspnea: no Chest pain: no Lower extremity edema: no Dizzy/lightheaded: no  DIABETES Hypoglycemic episodes:no Polydipsia/polyuria: no Visual disturbance: no Chest pain: no Paresthesias: no Glucose Monitoring: no  Accucheck frequency: Not Checking  Fasting glucose:  Post prandial:  Evening:  Before meals: Taking Insulin?: no  Long acting insulin:  Short acting insulin: Blood Pressure Monitoring: weekly Retinal Examination: Not up to Date Foot Exam: Up to Date Diabetic Education: Not Completed Pneumovax: Up to Date Influenza: Up to Date Aspirin: yes  Relevant past medical, surgical, family and social history reviewed and updated as indicated. Interim medical history since our last visit reviewed. Allergies and medications reviewed and updated.  Review of Systems  Eyes:  Negative for visual disturbance.  Respiratory:  Negative for chest tightness and  shortness of breath.   Cardiovascular:  Negative for chest pain, palpitations and leg swelling.  Endocrine: Negative for polydipsia and polyuria.  Neurological:  Negative for dizziness, light-headedness, numbness and headaches.   Per HPI unless specifically indicated above     Objective:    BP 132/78   Pulse 89   Temp 98.3 F (36.8 C) (Oral)   Wt 148 lb 3.2 oz (67.2 kg)   LMP 11/28/1994 (Approximate)   SpO2 100%   BMI 27.10 kg/m   Wt Readings from Last 3 Encounters:  04/08/21 148 lb 3.2 oz (67.2 kg)  03/09/21 144 lb 3.2 oz (65.4 kg)  02/23/21 142 lb (64.4 kg)    Physical Exam Vitals and nursing note reviewed.  Constitutional:      General: She is not in acute distress.    Appearance: Normal appearance. She is normal weight. She is not ill-appearing, toxic-appearing or diaphoretic.  HENT:     Head: Normocephalic.     Right Ear: External ear normal.     Left Ear: External ear normal.     Nose: Nose normal.     Mouth/Throat:     Mouth: Mucous membranes are moist.     Pharynx: Oropharynx is clear.  Eyes:     General:        Right eye: No discharge.        Left eye: No discharge.     Extraocular Movements: Extraocular movements intact.     Conjunctiva/sclera: Conjunctivae normal.     Pupils: Pupils are equal, round, and reactive to light.  Cardiovascular:     Rate and Rhythm: Normal rate and regular rhythm.     Heart  sounds: No murmur heard. Pulmonary:     Effort: Pulmonary effort is normal. No respiratory distress.     Breath sounds: Normal breath sounds. No wheezing or rales.  Musculoskeletal:     Cervical back: Normal range of motion and neck supple.  Skin:    General: Skin is warm and dry.     Capillary Refill: Capillary refill takes less than 2 seconds.  Neurological:     General: No focal deficit present.     Mental Status: She is alert and oriented to person, place, and time. Mental status is at baseline.  Psychiatric:        Mood and Affect: Mood normal.         Behavior: Behavior normal.        Thought Content: Thought content normal.        Judgment: Judgment normal.    Results for orders placed or performed in visit on 02/23/21  Comp Met (CMET)  Result Value Ref Range   Glucose 95 65 - 99 mg/dL   BUN 15 8 - 27 mg/dL   Creatinine, Ser 0.62 0.57 - 1.00 mg/dL   eGFR 92 >59 mL/min/1.73   BUN/Creatinine Ratio 24 12 - 28   Sodium 141 134 - 144 mmol/L   Potassium 3.6 3.5 - 5.2 mmol/L   Chloride 101 96 - 106 mmol/L   CO2 24 20 - 29 mmol/L   Calcium 9.8 8.7 - 10.3 mg/dL   Total Protein 6.7 6.0 - 8.5 g/dL   Albumin 4.3 3.7 - 4.7 g/dL   Globulin, Total 2.4 1.5 - 4.5 g/dL   Albumin/Globulin Ratio 1.8 1.2 - 2.2   Bilirubin Total 0.2 0.0 - 1.2 mg/dL   Alkaline Phosphatase 98 44 - 121 IU/L   AST 16 0 - 40 IU/L   ALT 11 0 - 32 IU/L  HgB A1c  Result Value Ref Range   Hgb A1c MFr Bld 6.0 (H) 4.8 - 5.6 %   Est. average glucose Bld gHb Est-mCnc 126 mg/dL  Thyroid Panel With TSH  Result Value Ref Range   TSH 1.620 0.450 - 4.500 uIU/mL   T4, Total 8.9 4.5 - 12.0 ug/dL   T3 Uptake Ratio 24 24 - 39 %   Free Thyroxine Index 2.1 1.2 - 4.9  CBC w/Diff  Result Value Ref Range   WBC 6.9 3.4 - 10.8 x10E3/uL   RBC 4.46 3.77 - 5.28 x10E6/uL   Hemoglobin 12.1 11.1 - 15.9 g/dL   Hematocrit 38.7 34.0 - 46.6 %   MCV 87 79 - 97 fL   MCH 27.1 26.6 - 33.0 pg   MCHC 31.3 (L) 31.5 - 35.7 g/dL   RDW 15.3 11.7 - 15.4 %   Platelets 362 150 - 450 x10E3/uL   Neutrophils 59 Not Estab. %   Lymphs 30 Not Estab. %   Monocytes 8 Not Estab. %   Eos 2 Not Estab. %   Basos 1 Not Estab. %   Neutrophils Absolute 4.0 1.4 - 7.0 x10E3/uL   Lymphocytes Absolute 2.1 0.7 - 3.1 x10E3/uL   Monocytes Absolute 0.6 0.1 - 0.9 x10E3/uL   EOS (ABSOLUTE) 0.2 0.0 - 0.4 x10E3/uL   Basophils Absolute 0.0 0.0 - 0.2 x10E3/uL   Immature Granulocytes 0 Not Estab. %   Immature Grans (Abs) 0.0 0.0 - 0.1 x10E3/uL  Microalbumin, Urine Waived  Result Value Ref Range   Microalb, Ur  Waived 10 0 - 19 mg/L   Creatinine, Urine Waived 10 10 -  300 mg/dL   Microalb/Creat Ratio <30 <30 mg/g      Assessment & Plan:   Problem List Items Addressed This Visit       Cardiovascular and Mediastinum   Hypertension associated with diabetes ([place]) - Primary    Chronic.  Controlled.  Continue with current medication regimen on Amlodipine 75m and Lisinopril 261m  Labs ordered today.  Refills sent today.  Return to clinic in 3 months for reevaluation.  Call sooner if concerns arise.        Relevant Medications   amLODipine (NORVASC) 10 MG tablet   lisinopril (ZESTRIL) 20 MG tablet   Other Relevant Orders   Comp Met (CMET)   HgB A1c   Lipid Profile   Atherosclerosis of aorta ([facility] [place]   Noted on imaging in 2019.  Continue ASA and Atorvastatin for prevention and recommend healthy diet changes and regular activity.  Will reassess at future visits.      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  Controlled.  Continue with current medication regimen of Atorvastatin 2047m Labs ordered today.  Return to clinic in 3 months for reevaluation.  Call sooner if concerns arise.         Relevant Medications   amLODipine (NORVASC) 10 MG tablet   lisinopril (ZESTRIL) 20 MG tablet   Other Relevant Orders   Comp Met (CMET)   HgB A1c   Lipid Profile     Other   Weight loss    Patient has gained 4lbs since last visit. She is still drinking 2 boost shakes per day as well as eating.  Recommend she continue with boost to aid in weight gain.  Would like patient to get back to 155lbs by the end of the year.  Will see her in 2 months for reevaluation.         Follow up  plan: Return in about 2 months (around 06/08/2021) for weight check.

## 2021-04-08 ENCOUNTER — Other Ambulatory Visit: Payer: Self-pay

## 2021-04-08 ENCOUNTER — Ambulatory Visit (INDEPENDENT_AMBULATORY_CARE_PROVIDER_SITE_OTHER): Payer: Medicare Other | Admitting: Nurse Practitioner

## 2021-04-08 ENCOUNTER — Encounter: Payer: Self-pay | Admitting: Nurse Practitioner

## 2021-04-08 VITALS — BP 132/78 | HR 89 | Temp 98.3°F | Wt 148.2 lb

## 2021-04-08 DIAGNOSIS — I152 Hypertension secondary to endocrine disorders: Secondary | ICD-10-CM | POA: Diagnosis not present

## 2021-04-08 DIAGNOSIS — E785 Hyperlipidemia, unspecified: Secondary | ICD-10-CM | POA: Diagnosis not present

## 2021-04-08 DIAGNOSIS — I700 Atherosclerosis of aorta: Secondary | ICD-10-CM

## 2021-04-08 DIAGNOSIS — R809 Proteinuria, unspecified: Secondary | ICD-10-CM

## 2021-04-08 DIAGNOSIS — E1159 Type 2 diabetes mellitus with other circulatory complications: Secondary | ICD-10-CM | POA: Diagnosis not present

## 2021-04-08 DIAGNOSIS — E1129 Type 2 diabetes mellitus with other diabetic kidney complication: Secondary | ICD-10-CM | POA: Diagnosis not present

## 2021-04-08 DIAGNOSIS — E1169 Type 2 diabetes mellitus with other specified complication: Secondary | ICD-10-CM

## 2021-04-08 DIAGNOSIS — R634 Abnormal weight loss: Secondary | ICD-10-CM

## 2021-04-08 MED ORDER — LISINOPRIL 20 MG PO TABS: 20.0000 mg | ORAL_TABLET | Freq: Every day | ORAL | 1 refills | Status: DC

## 2021-04-08 MED ORDER — AMLODIPINE BESYLATE 10 MG PO TABS: 10.0000 mg | ORAL_TABLET | Freq: Every day | ORAL | 1 refills | Status: DC

## 2021-04-08 NOTE — Assessment & Plan Note (Signed)
Chronic.  Controlled.  Continue with current medication regimen of Atorvastatin 20mg.  Labs ordered today.  Return to clinic in 3 months for reevaluation.  Call sooner if concerns arise.   

## 2021-04-08 NOTE — Assessment & Plan Note (Signed)
Noted on imaging in 2019.  Continue ASA and Atorvastatin for prevention and recommend healthy diet changes and regular activity.  Will reassess at future visits.

## 2021-04-08 NOTE — Assessment & Plan Note (Signed)
Patient has gained 4lbs since last visit. She is still drinking 2 boost shakes per day as well as eating.  Recommend she continue with boost to aid in weight gain.  Would like patient to get back to 155lbs by the end of the year.  Will see her in 2 months for reevaluation.

## 2021-04-08 NOTE — Assessment & Plan Note (Addendum)
Chronic.  Controlled.  Continue with current medication regimen on Amlodipine 10mg and Lisinopril 20mg.  Labs ordered today.  Refills sent today.  Return to clinic in 3 months for reevaluation.  Call sooner if concerns arise.  ° °

## 2021-04-09 LAB — LIPID PANEL
Chol/HDL Ratio: 2.1 ratio (ref 0.0–4.4)
Cholesterol, Total: 142 mg/dL (ref 100–199)
HDL: 67 mg/dL (ref >39)
LDL Chol Calc (NIH): 50 mg/dL (ref 0–99)
Triglycerides: 152 mg/dL — ABNORMAL HIGH (ref 0–149)
VLDL Cholesterol Cal: 25 mg/dL (ref 5–40)

## 2021-04-09 LAB — COMPREHENSIVE METABOLIC PANEL
ALT: 15 IU/L (ref 0–32)
AST: 22 IU/L (ref 0–40)
Albumin/Globulin Ratio: 1.6 (ref 1.2–2.2)
Albumin: 4.2 g/dL (ref 3.7–4.7)
Alkaline Phosphatase: 96 IU/L (ref 44–121)
BUN/Creatinine Ratio: 30 — ABNORMAL HIGH (ref 12–28)
BUN: 19 mg/dL (ref 8–27)
Bilirubin Total: <0.2 mg/dL (ref 0.0–1.2)
CO2: 23 mmol/L (ref 20–29)
Calcium: 9.7 mg/dL (ref 8.7–10.3)
Chloride: 103 mmol/L (ref 96–106)
Creatinine, Ser: 0.63 mg/dL (ref 0.57–1.00)
Globulin, Total: 2.6 g/dL (ref 1.5–4.5)
Glucose: 93 mg/dL (ref 70–99)
Potassium: 4 mmol/L (ref 3.5–5.2)
Sodium: 142 mmol/L (ref 134–144)
Total Protein: 6.8 g/dL (ref 6.0–8.5)
eGFR: 91 mL/min/{1.73_m2} (ref >59)

## 2021-04-09 LAB — HEMOGLOBIN A1C
Est. average glucose Bld gHb Est-mCnc: 123 mg/dL
Hgb A1c MFr Bld: 5.9 % — ABNORMAL HIGH (ref 4.8–5.6)

## 2021-04-11 NOTE — Progress Notes (Signed)
Please let patient know that her lab work looks great.  A1c is well controlled at 5.9.  Keep up the good work.  Cholesterol is also well controlled.  Please let me know if she has any questions.

## 2021-04-27 ENCOUNTER — Ambulatory Visit: Payer: Medicare Other

## 2021-04-28 ENCOUNTER — Ambulatory Visit (INDEPENDENT_AMBULATORY_CARE_PROVIDER_SITE_OTHER): Payer: Medicare Other | Admitting: Nurse Practitioner

## 2021-04-28 DIAGNOSIS — Z0000 Encounter for general adult medical examination without abnormal findings: Secondary | ICD-10-CM | POA: Diagnosis not present

## 2021-04-28 NOTE — Patient Instructions (Signed)
Ellen [name] , Thank you for taking time to come for your Medicare Wellness Visit. I appreciate your ongoing commitment to your health goals. Please review the following plan we discussed and let me know if I can assist you in the future.   Screening recommendations/referrals: Colonoscopy: up to date Mammogram: Education provided Bone Density: Education provided Recommended yearly ophthalmology/optometry visit for glaucoma screening and checkup Recommended yearly dental visit for hygiene and checkup  Vaccinations: Influenza vaccine: up to date Pneumococcal vaccine: up to date Tdap vaccine: Education provided Shingles vaccine: Education provided    Advanced directives: Education provided  Conditions/risks identified:   Next appointment: 06-08-2021 @ 9:40  [facility]   Preventive Care 65 Years and Older, Female Preventive care refers to lifestyle choices and visits with your health care provider that can promote health and wellness. What does preventive care include? A yearly physical exam. This is also called an annual well check. Dental exams once or twice a year. Routine eye exams. Ask your health care provider how often you should have your eyes checked. Personal lifestyle choices, including: Daily care of your teeth and gums. Regular physical activity. Eating a healthy diet. Avoiding tobacco and drug use. Limiting alcohol use. Practicing safe sex. Taking low-dose aspirin every day. Taking vitamin and mineral supplements as recommended by your health care provider. What happens during an annual well check? The services and screenings done by your health care provider during your annual well check will depend on your age, overall health, lifestyle risk factors, and family history of disease. Counseling  Your health care provider may ask you questions about your: Alcohol use. Tobacco use. Drug use. Emotional well-being. Home and relationship well-being. Sexual  activity. Eating habits. History of falls. Memory and ability to understand (cognition). Work and work Astronomer. Reproductive health. Screening  You may have the following tests or measurements: Height, weight, and BMI. Blood pressure. Lipid and cholesterol levels. These may be checked every 5 years, or more frequently if you are over 84 years old. Skin check. Lung cancer screening. You may have this screening every year starting at age 4 if you have a 30-pack-year history of smoking and currently smoke or have quit within the past 15 years. Fecal occult blood test (FOBT) of the stool. You may have this test every year starting at age 48. Flexible sigmoidoscopy or colonoscopy. You may have a sigmoidoscopy every 5 years or a colonoscopy every 10 years starting at age 68. Hepatitis C blood test. Hepatitis B blood test. Sexually transmitted disease (STD) testing. Diabetes screening. This is done by checking your blood sugar (glucose) after you have not eaten for a while (fasting). You may have this done every 1-3 years. Bone density scan. This is done to screen for osteoporosis. You may have this done starting at age 31. Mammogram. This may be done every 1-2 years. Talk to your health care provider about how often you should have regular mammograms. Talk with your health care provider about your test results, treatment options, and if necessary, the need for more tests. Vaccines  Your health care provider may recommend certain vaccines, such as: Influenza vaccine. This is recommended every year. Tetanus, diphtheria, and acellular pertussis (Tdap, Td) vaccine. You may need a Td booster every 10 years. Zoster vaccine. You may need this after age 37. Pneumococcal 13-valent conjugate (PCV13) vaccine. One dose is recommended after age 56. Pneumococcal polysaccharide (PPSV23) vaccine. One dose is recommended after age 34. Talk to your health care provider about which  screenings and vaccines  you need and how often you need them. This information is not intended to replace advice given to you by your health care provider. Make sure you discuss any questions you have with your health care provider. Document Released: 07/02/2015 Document Revised: 02/23/2016 Document Reviewed: 04/06/2015 Elsevier Interactive Patient Education  2017 [place] Prevention in the Home Falls can cause injuries. They can happen to people of all ages. There are many things you can do to make your home safe and to help prevent falls. What can I do on the outside of my home? Regularly fix the edges of walkways and driveways and fix any cracks. Remove anything that might make you trip as you walk through a door, such as a raised step or threshold. Trim any bushes or trees on the path to your home. Use bright outdoor lighting. Clear any walking paths of anything that might make someone trip, such as rocks or tools. Regularly check to see if handrails are loose or broken. Make sure that both sides of any steps have handrails. Any raised decks and porches should have guardrails on the edges. Have any leaves, snow, or ice cleared regularly. Use sand or salt on walking paths during winter. Clean up any spills in your garage right away. This includes oil or grease spills. What can I do in the bathroom? Use night lights. Install grab bars by the toilet and in the tub and shower. Do not use towel bars as grab bars. Use non-skid mats or decals in the tub or shower. If you need to sit down in the shower, use a plastic, non-slip stool. Keep the floor dry. Clean up any water that spills on the floor as soon as it happens. Remove soap buildup in the tub or shower regularly. Attach bath mats securely with double-sided non-slip rug tape. Do not have throw rugs and other things on the floor that can make you trip. What can I do in the bedroom? Use night lights. Make sure that you have a light by your bed that  is easy to reach. Do not use any sheets or blankets that are too big for your bed. They should not hang down onto the floor. Have a firm chair that has side arms. You can use this for support while you get dressed. Do not have throw rugs and other things on the floor that can make you trip. What can I do in the kitchen? Clean up any spills right away. Avoid walking on wet floors. Keep items that you use a lot in easy-to-reach places. If you need to reach something above you, use a strong step stool that has a grab bar. Keep electrical cords out of the way. Do not use floor polish or wax that makes floors slippery. If you must use wax, use non-skid floor wax. Do not have throw rugs and other things on the floor that can make you trip. What can I do with my stairs? Do not leave any items on the stairs. Make sure that there are handrails on both sides of the stairs and use them. Fix handrails that are broken or loose. Make sure that handrails are as long as the stairways. Check any carpeting to make sure that it is firmly attached to the stairs. Fix any carpet that is loose or worn. Avoid having throw rugs at the top or bottom of the stairs. If you do have throw rugs, attach them to the floor with carpet  tape. Make sure that you have a light switch at the top of the stairs and the bottom of the stairs. If you do not have them, ask someone to add them for you. What else can I do to help prevent falls? Wear shoes that: Do not have high heels. Have rubber bottoms. Are comfortable and fit you well. Are closed at the toe. Do not wear sandals. If you use a stepladder: Make sure that it is fully opened. Do not climb a closed stepladder. Make sure that both sides of the stepladder are locked into place. Ask someone to hold it for you, if possible. Clearly mark and make sure that you can see: Any grab bars or handrails. First and last steps. Where the edge of each step is. Use tools that help you  move around (mobility aids) if they are needed. These include: Canes. Walkers. Scooters. Crutches. Turn on the lights when you go into a dark area. Replace any light bulbs as soon as they burn out. Set up your furniture so you have a clear path. Avoid moving your furniture around. If any of your floors are uneven, fix them. If there are any pets around you, be aware of where they are. Review your medicines with your doctor. Some medicines can make you feel dizzy. This can increase your chance of falling. Ask your doctor what other things that you can do to help prevent falls. This information is not intended to replace advice given to you by your health care provider. Make sure you discuss any questions you have with your health care provider. Document Released: 04/01/2009 Document Revised: 11/11/2015 Document Reviewed: 07/10/2014 Elsevier Interactive Patient Education  2017 [employer].

## 2021-04-28 NOTE — Progress Notes (Addendum)
Subjective:   Ellen [name] is a 80 y.o. female who presents for Medicare Annual (Subsequent) preventive examination.  I connected with  Ellen [name] on 04/28/21 by a telephone enabled telemedicine application and verified that I am speaking with the correct person using two identifiers.   I discussed the limitations of evaluation and management by telemedicine. The patient expressed understanding and agreed to proceed.  Patient location:  Home   Provider location : Tele-Health visit  not in office    Review of Systems     Cardiac Risk Factors include: advanced age (>10mn, >>86women);diabetes mellitus;obesity (BMI >30kg/m2);sedentary lifestyle;hypertension     Objective:    Today's Vitals   There is no height or weight on file to calculate BMI.  Advanced Directives 04/28/2021 01/10/2021 04/26/2020 03/29/2020 01/05/2020 11/04/2019 09/26/2019  Does Patient Have a Medical Advance Directive? No Yes _0   Type of Advance Directive - - - - - - -  Would patient like information on creating a medical advance directive? No - Patient declined - - Yes (MAU/Ambulatory/Procedural Areas - Information given) No - Patient declined - No - Patient declined    Current Medications (verified) Outpatient Encounter Medications as of 04/28/2021  Medication Sig   amLODipine (NORVASC) 10 MG tablet Take 1 tablet (10 mg total) by mouth daily.   aspirin 81 MG tablet Take 81 mg by mouth daily.   atorvastatin (LIPITOR) 20 MG tablet Take 1 tablet (20 mg total) by mouth daily.   blood glucose meter kit and supplies KIT Dispense based on patient and insurance preference. Use up to four times daily as directed. (FOR ICD-9 250.00, 250.01).   CALCIUM-MAGNESIUM-ZINC PO Take 1 tablet by mouth daily.   calcium-vitamin D 250-100 MG-UNIT tablet Take 1 tablet by mouth 2 (two) times daily.   Cholecalciferol (VITAMIN D3) 1000 units CAPS Take 2 capsules by mouth daily.    cyclobenzaprine (FLEXERIL) 10 MG  tablet Take 10 mg by mouth 2 (two) times daily as needed.   lisinopril (ZESTRIL) 20 MG tablet Take 1 tablet (20 mg total) by mouth daily.   Magnesium 500 MG TABS Take 1 tablet by mouth daily.   metFORMIN (GLUCOPHAGE) 500 MG tablet Start out taking 1 tablet (500 MG) by mouth every morning with meal, then on week two start taking 1 tablet (500 MG) by mouth in morning with meal and 1 tablet (500 MG) in evening with meal.   PFIZER-BIONT COVID-19 VAC-TRIS SUSP injection    vitamin C (ASCORBIC ACID) 500 MG tablet Take 500 mg by mouth daily.   No facility-administered encounter medications on file as of 04/28/2021.    Allergies (verified) Patient has no known allergies.   History: Past Medical History:  Diagnosis Date   Hyperlipidemia    Hypertension    Lumbago    Menopause    Migraines    Past Surgical History:  Procedure Laterality Date   CHOLECYSTECTOMY  1982   TUBAL LIGATION  1984   Family History  Problem Relation Age of Onset   Alzheimer's disease Mother    Diabetes Mother    Stroke Mother    Hyperlipidemia Mother    Stroke Father    Hypertension Father    Hyperlipidemia Father    Diabetes Sister    Diabetes Brother    Hypertension Brother    Diabetes Sister    Social History   Socioeconomic History   Marital status: Married    Spouse name: Not on file  Number of children: Not on file   Years of education: some college   Highest education level: Some college, no degree  Occupational History   Occupation: retired  Tobacco Use   Smoking status: Never   Smokeless tobacco: Never  Vaping Use   Vaping Use: Never used  Substance and Sexual Activity   Alcohol use: No    Alcohol/week: 0.0 standard drinks   Drug use: No   Sexual activity: Yes  Other Topics Concern   Not on file  Social History Narrative   Not on file   Social Determinants of Health   Financial Resource Strain: Low Risk    Difficulty of Paying Living Expenses: Not hard at all  Food  Insecurity: No Food Insecurity   Worried About Charity fundraiser in the Last Year: Never true   [place] in the Last Year: Never true  Transportation Needs: No Transportation Needs   Lack of Transportation (Medical): No   Lack of Transportation (Non-Medical): No  Physical Activity: Inactive   Days of Exercise per Week: 0 days   Minutes of Exercise per Session: 0 min  Stress: No Stress Concern Present   Feeling of Stress : Not at all  Social Connections: Moderately Integrated   Frequency of Communication with Friends and Family: More than three times a week   Frequency of Social Gatherings with Friends and Family: More than three times a week   Attends Religious Services: More than 4 times per year   Active Member of [employer] or Organizations: No   Attends Music therapist: Never   Marital Status: Married    Tobacco Counseling Counseling given: Not Answered   Clinical Intake:  Pre-visit preparation completed: Yes  Pain : No/denies pain     Nutritional Risks: None Diabetes: Yes CBG done?: No Did pt. bring in CBG monitor from home?: No  How often do you need to have someone help you when you read instructions, pamphlets, or other written materials from your doctor or pharmacy?: 1 - Never  Diabetic?  yes   Nutrition Risk Assessment:  Has the patient had any N/V/D within the last 2 months?  No  Does the patient have any non-healing wounds?  No  Has the patient had any unintentional weight loss or weight gain?  No   Diabetes:  Is the patient diabetic?  Yes  If diabetic, was a CBG obtained today?  No  Did the patient bring in their glucometer from home?  No  How often do you monitor your CBG's? 1 x a week.   Financial Strains and Diabetes Management:  Are you having any financial strains with the device, your supplies or your medication? No .  Does the patient want to be seen by Chronic Care Management for management of their diabetes?  No  Would  the patient like to be referred to a Nutritionist or for Diabetic Management?  No   Diabetic Exams:  Diabetic Eye Exam: . Overdue for diabetic eye exam. Pt has been advised about the importance in completing this exam.   Diabetic Foot Exam . Pt has been advised about the importance in completing this exam.  Interpreter Needed?: No  Information entered by :: Leroy Kennedy LPN   Activities of Daily Living In your present state of health, do you have any difficulty performing the following activities: 04/28/2021 05/31/2020  Hearing? N N  Vision? N Y  Difficulty concentrating or making decisions? N N  Walking or  climbing stairs? N N  Dressing or bathing? N N  Doing errands, shopping? N N  Preparing Food and eating ? N -  Using the Toilet? N -  In the past six months, have you accidently leaked urine? N -  Do you have problems with loss of bowel control? N -  Managing your Medications? N -  Managing your Finances? N -  Housekeeping or managing your Housekeeping? N -  Some recent data might be hidden    Patient Care Team: Jon Billings, NP as PCP - General [facility] Busing Nobie Putnam, RN as Case Manager (General Practice)  Indicate any recent Medical Services you may have received from other than [facility] providers in the past year (date may be approximate).     Assessment:   This is a routine wellness examination for [facility].  Hearing/Vision screen Hearing Screening - Comments:: No trouble hearing Vision Screening - Comments:: Not up to date [facility]  Dietary issues and exercise activities discussed: Current Exercise Habits: The patient does not participate in regular exercise at present   Goals Addressed             This Visit's Progress    DIET - EAT MORE FRUITS AND VEGETABLES         Depression Screen PHQ 2/9 Scores 04/28/2021 01/19/2021 01/10/2021 07/01/2020 05/31/2020 04/26/2020 01/05/2020  PHQ - 2 Score 0 0 0 0 0 0 0  PHQ- 9 Score - - - - - - -    Fall Risk Fall Risk   04/28/2021 02/23/2021 02/15/2021 01/10/2021 07/01/2020  Falls in the past year? 0 0 0 0 0  Number falls in past yr: 0 0 0 - -  Injury with Fall? 0 0 - - -  Risk for fall due to : - No Fall Risks No Fall Risks - -  Follow up Falls evaluation completed;Falls prevention discussed - Falls evaluation completed - -    FALL RISK PREVENTION PERTAINING TO THE HOME:  Any stairs in or around the home? No  If so, are there any without handrails? No  Home free of loose throw rugs in walkways, pet beds, electrical cords, etc? Yes  Adequate lighting in your home to reduce risk of falls? Yes   ASSISTIVE DEVICES UTILIZED TO PREVENT FALLS:  Life alert? No  Use of a cane, walker or w/c? No  Grab bars in the bathroom? No  Shower chair or bench in shower? No  Elevated toilet seat or a handicapped toilet? Yes   TIMED UP AND GO:  Was the test performed? No .   Cognitive Function:  Normal cognitive status assessed by direct observation by this Nurse Health Advisor. No abnormalities found.       6CIT Screen 04/26/2020 04/23/2019 04/15/2018 04/13/2017  What Year? 0 points 0 points 0 points 0 points  What month? 0 points 0 points 0 points 0 points  What time? 0 points 0 points 0 points 0 points  Count back from 20 0 points 0 points 0 points 0 points  Months in reverse 0 points 0 points 0 points 0 points  Repeat phrase 2 points 0 points 2 points 0 points  Total Score 2 0 2 0    Immunizations Immunization History  Administered Date(s) Administered   Fluad Quad(high Dose 65+) 03/18/2019, 03/05/2020, 03/09/2021   Influenza, High Dose Seasonal PF 03/06/2016, 04/13/2017, 04/15/2018   Influenza,inj,Quad PF,6+ Mos 03/30/2015   PFIZER(Purple Top)SARS-COV-2 Vaccination 07/04/2019, 07/28/2019, 03/29/2020, 10/25/2020   Pneumococcal Conjugate-13 02/02/2014  Pneumococcal Polysaccharide-23 01/27/2013   Td 07/21/2008    TDAP status: Due, Education has been provided regarding the importance of this vaccine.  Advised may receive this vaccine at local pharmacy or Health Dept. Aware to provide a copy of the vaccination record if obtained from local pharmacy or Health Dept. Verbalized acceptance and understanding.  Flu Vaccine status: Up to date  Pneumococcal vaccine status: Up to date  Covid-19 vaccine status: Completed vaccines  Qualifies for Shingles Vaccine? Yes   Zostavax completed No   Shingrix Completed?: No.    Education has been provided regarding the importance of this vaccine. Patient has been advised to call insurance company to determine out of pocket expense if they have not yet received this vaccine. Advised may also receive vaccine at local pharmacy or Health Dept. Verbalized acceptance and understanding.  Screening Tests Health Maintenance  Topic Date Due   OPHTHALMOLOGY EXAM  Never done   COVID-19 Vaccine (5 - Booster for Pfizer series) 12/20/2020   Zoster Vaccines- Shingrix (1 of 2) 07/09/2021 (Originally 06/14/1993)   MAMMOGRAM  04/08/2022 (Originally 06/05/2020)   TETANUS/TDAP  04/08/2022 (Originally 07/21/2018)   HEMOGLOBIN A1C  10/07/2021   FOOT EXAM  04/08/2022   Pneumonia Vaccine 21+ Years old  Completed   INFLUENZA VACCINE  Completed   DEXA SCAN  Completed   Hepatitis C Screening  Completed   HPV VACCINES  Aged Out    Health Maintenance  Health Maintenance Due  Topic Date Due   OPHTHALMOLOGY EXAM  Never done   COVID-19 Vaccine (5 - Booster for [place] series) 12/20/2020    Colorectal cancer screening: No longer required.   Mammogram declined  Bone Density declined  Lung Cancer Screening: (Low Dose CT Chest recommended if Age 34-80 years, 30 pack-year currently smoking OR have quit w/in 15years.) does not qualify.   Lung Cancer Screening Referral:   Additional Screening:  Hepatitis C Screening: does not qualify; Completed   Vision Screening: Recommended annual ophthalmology exams for early detection of glaucoma and other disorders of the eye. Is the  patient up to date with their annual eye exam?  No  Who is the provider or what is the name of the office in which the patient attends annual eye exams? [facility] If pt is not established with a provider, would they like to be referred to a provider to establish care? No .   Dental Screening: Recommended annual dental exams for proper oral hygiene  Community Resource Referral / Chronic Care Management: CRR required this visit?  No   CCM required this visit?  No      Plan:     I have personally reviewed and noted the following in the patient's chart:   Medical and social history Use of alcohol, tobacco or illicit drugs  Current medications and supplements including opioid prescriptions.  Functional ability and status Nutritional status Physical activity Advanced directives List of other physicians Hospitalizations, surgeries, and ER visits in previous 12 months Vitals Screenings to include cognitive, depression, and falls Referrals and appointments  In addition, I have reviewed and discussed with patient certain preventive protocols, quality metrics, and best practice recommendations. A written personalized care plan for preventive services as well as general preventive health recommendations were provided to patient.     Leroy Kennedy, LPN   [id number]   Nurse Notes:

## 2021-04-29 ENCOUNTER — Ambulatory Visit: Payer: Medicare Other

## 2021-05-18 ENCOUNTER — Ambulatory Visit (INDEPENDENT_AMBULATORY_CARE_PROVIDER_SITE_OTHER): Payer: Medicare Other

## 2021-05-18 ENCOUNTER — Telehealth: Payer: Medicare Other

## 2021-05-18 ENCOUNTER — Encounter: Payer: Self-pay | Admitting: Nurse Practitioner

## 2021-05-18 DIAGNOSIS — E785 Hyperlipidemia, unspecified: Secondary | ICD-10-CM

## 2021-05-18 DIAGNOSIS — E1129 Type 2 diabetes mellitus with other diabetic kidney complication: Secondary | ICD-10-CM

## 2021-05-18 DIAGNOSIS — E1169 Type 2 diabetes mellitus with other specified complication: Secondary | ICD-10-CM

## 2021-05-18 DIAGNOSIS — R413 Other amnesia: Secondary | ICD-10-CM

## 2021-05-18 DIAGNOSIS — E1159 Type 2 diabetes mellitus with other circulatory complications: Secondary | ICD-10-CM

## 2021-05-18 NOTE — Patient Instructions (Signed)
Visit Information  Thank you for taking time to visit with me today. Please don't hesitate to contact me if I can be of assistance to you before our next scheduled telephone appointment.  Following are the goals we discussed today:  RNCM Clinical Goal(s):  Patient will verbalize basic understanding of HTN, HLD, DMII, and Memory Changes  disease process and self health management plan as evidenced by following the plan of care, taking medications, keeping appointments and calling the office for questions or concerns take all medications exactly as prescribed and will call provider for medication related questions as evidenced by compliance and calling for refills before running out of medications     attend all scheduled medical appointments: 06-08-2021 at 0940 am as evidenced by keeping appointments and calling for reschedule needs         demonstrate improved and ongoing adherence to prescribed treatment plan for HTN, HLD, DMII, and memory changes as evidenced by working with the pcp and CCM team to optimize the plan of care and effectively manage health and well being demonstrate a decrease in memory changes exacerbations  as evidenced by working with the pcp and CCM team to effectively manage short term memory problems  demonstrate ongoing self health care management ability for effective management of Chronic conditions  as evidenced by working with the CCM team  through collaboration with Consulting civil engineer, provider, and care team.   Interventions: 1:1 collaboration with primary care provider regarding development and update of comprehensive plan of care as evidenced by provider attestation and co-signature Inter-disciplinary care team collaboration (see longitudinal plan of care) Evaluation of current treatment plan related to  self management and patient's adherence to plan as established by provider   Diabetes:  (Status: Goal on Track (progressing): YES.) Long Term Goal   Lab Results   Component Value Date   HGBA1C 5.9 (H) 04/08/2021  Assessed patient's understanding of A1c goal: <7% Provided education to patient about basic DM disease process; Reviewed medications with patient and discussed importance of medication adherence;        Reviewed prescribed diet with patient heart healthy/ADA diet. 05-18-2021: The patient is drinking 2 boost daily to aide in nutrition and have no further weight loss. The patient endorses no further weight loss; Counseled on importance of regular laboratory monitoring as prescribed;        Discussed plans with patient for ongoing care management follow up and provided patient with direct contact information for care management team;      Provided patient with written educational materials related to hypo and hyperglycemia and importance of correct treatment;       Reviewed scheduled/upcoming provider appointments including: 06-08-2021 at 0940 am;         Advised patient, providing education and rationale, to check cbg when you have symptoms of low or high blood sugar, before and after exercise, and as directed   and record        call provider for findings outside established parameters;       Review of patient status, including review of consultants reports, relevant laboratory and other test results, and medications completed;       Screening for signs and symptoms of depression related to chronic disease state;        Assessed social determinant of health barriers;         Memory Changes   (Status: New goal.) Long Term Goal  Evaluation of current treatment plan related to  Memory Changes  ,  Memory Deficits self-management and patient's adherence to plan as established by provider. Discussed plans with patient for ongoing care management follow up and provided patient with direct contact information for care management team Advised patient to write down questions to ask the provider at upcoming visit with pcp and to communicate memory changes  noted by the family; Provided education to patient re: cognitive changes to look for, importance of making sure that the patient is taking medications as directed and the importance of safety for the patient; Collaborated with pcp regarding discussion with daughter about concerns of her and her other siblings noticing changes in memory, specifically short term memory.  Advised the patients daughter, Levada Dy to discuss this with the pcp at upcoming appointment on 06-08-2021.  Levada Dy is planning on coming to the appointment with the patient. She wants to do what she can to help the patient maintain independence and get the things she needs to remain stable in her health; Provided patient with memory enhancement educational materials related to memory changes and help with resources in memory enhancement; Reviewed scheduled/upcoming provider appointments including 06-08-2021 at 0940 am; Discussed plans with patient for ongoing care management follow up and provided patient with direct contact information for care management team; Advised patient to discuss concerns and changes noted in memory  with provider; Screening for signs and symptoms of depression related to chronic disease state;  Assessed social determinant of health barriers;   Hyperlipidemia:  (Status: Goal on Track (progressing): YES.) Long Term Goal  Lab Results  Component Value Date   CHOL 142 04/08/2021   HDL 67 04/08/2021   LDLCALC 50 04/08/2021   TRIG 152 (H) 04/08/2021   CHOLHDL 2.1 04/08/2021     Medication review performed; medication list updated in electronic medical record.  Provider established cholesterol goals reviewed; Counseled on importance of regular laboratory monitoring as prescribed; Provided HLD educational materials; Reviewed role and benefits of statin for ASCVD risk reduction; Discussed strategies to manage statin-induced myalgias; Reviewed importance of limiting foods high in cholesterol;  Hypertension:  (Status: Goal on Track (progressing): YES.) Last practice recorded BP readings:  BP Readings from Last 3 Encounters:  04/08/21 132/78  03/09/21 (!) 144/82  02/23/21 (!) 162/84  Most recent eGFR/CrCl:  Lab Results  Component Value Date   EGFR 91 04/08/2021    No components found for: CRCL  Evaluation of current treatment plan related to hypertension self management and patient's adherence to plan as established by provider;   Provided education to patient re: stroke prevention, s/s of heart attack and stroke; Reviewed prescribed diet heart healthy/ADA diet  Reviewed medications with patient and discussed importance of compliance;  Discussed plans with patient for ongoing care management follow up and provided patient with direct contact information for care management team; Advised patient, providing education and rationale, to monitor blood pressure daily and record, calling PCP for findings outside established parameters;  Advised patient to discuss blood pressure changes  with provider; Provided education on prescribed diet heart healthy/ADA diet ;  Discussed complications of poorly controlled blood pressure such as heart disease, stroke, circulatory complications, vision complications, kidney impairment, sexual dysfunction;   Patient Goals/Self-Care Activities: Take medications as prescribed   Attend all scheduled provider appointments Call pharmacy for medication refills 3-7 days in advance of running out of medications Attend church or other social activities Perform all self care activities independently  Perform IADL's (shopping, preparing meals, housekeeping, managing finances) independently Call provider office for new concerns or questions  Work  with the social worker to address care coordination needs and will continue to work with the clinical team to address health care and disease management related needs call the Suicide and Crisis Lifeline: 988 call the [place] National  Suicide Prevention Lifeline: 478-332-9041 or TTY: 802-243-4707 TTY 236-550-2619) to talk to a trained counselor call 1-800-273-TALK (toll free, 24 hour hotline) if experiencing a Mental Health or [place]  schedule appointment with eye doctor check blood sugar at prescribed times: when you have symptoms of low or high blood sugar, before and after exercise, and as directed by pcp  check feet daily for cuts, sores or redness enter blood sugar readings and medication or insulin into daily log take the blood sugar log to all doctor visits trim toenails straight across drink 6 to 8 glasses of water each day eat fish at least once per week fill half of plate with vegetables limit fast food meals to no more than 1 per week manage portion size prepare main meal at home 3 to 5 days each week read food labels for fat, fiber, carbohydrates and portion size reduce red meat to 2 to 3 times a week keep feet up while sitting wash and dry feet carefully every day wear comfortable, cotton socks wear comfortable, well-fitting shoes check blood pressure weekly choose a place to take my blood pressure (home, clinic or office, retail store) write blood pressure results in a log or diary learn about high blood pressure keep a blood pressure log take blood pressure log to all doctor appointments call doctor for signs and symptoms of high blood pressure develop an action plan for high blood pressure keep all doctor appointments take medications for blood pressure exactly as prescribed report new symptoms to your doctor eat more whole grains, fruits and vegetables, lean meats and healthy fats - call for medicine refill 2 or 3 days before it runs out - take all medications exactly as prescribed - call doctor with any symptoms you believe are related to your medicine - call doctor when you experience any new symptoms - go to all doctor appointments as scheduled - adhere to prescribed  diet: Heart healthy/ADA    Our next appointment is by telephone on 07-26-2021 at 0945 am  Please call the care guide team at [phone] if you need to cancel or reschedule your appointment.   If you are experiencing a Mental Health or [place] or need someone to talk to, please call the Suicide and Crisis Lifeline: 988 call the [place] National Suicide Prevention Lifeline: (385)735-4067 or TTY: 440-294-7171 TTY 5188015486) to talk to a trained counselor call 1-800-273-TALK (toll free, 24 hour hotline)   Patient verbalizes understanding of instructions provided today and agrees to view in [place].   Noreene Larsson RN, MSN, [facility] Mobile: [phone]  Memory Compensation Strategies  Use "WARM" strategy.  W= write it down  A= associate it  R= repeat it  M= make a mental note  2.   You can keep a Social worker.  Use a 3-ring notebook with sections for the following: calendar, important names and phone numbers,  medications, doctors' names/phone numbers, lists/reminders, and a section to journal what you did  each day.   3.    Use a calendar to write appointments down.  4.    Write yourself a schedule for the day.  This can be placed on the calendar or in a separate  section of the Memory Notebook.  Keeping a  regular schedule can help memory.  5.    Use medication organizer with sections for each day or morning/evening pills.  You may need help loading it  6.    Keep a basket, or pegboard by the door.  Place items that you need to take out with you in the basket or on the pegboard.  You may also want to  include a message board for reminders.  7.    Use sticky notes.  Place sticky notes with reminders in a place where the task is performed.  For example: " turn off the  stove" placed by the stove, "lock the door" placed on the door at eye level, " take your medications" on  the  bathroom mirror or by the place where you normally take your medications.  8.    Use alarms/timers.  Use while cooking to remind yourself to check on food or as a reminder to take your medicine, or as a  reminder to make a call, or as a reminder to perform another task, etc.

## 2021-05-18 NOTE — Chronic Care Management (AMB) (Signed)
Chronic Care Management   CCM RN Visit Note  05/18/2021 Name: Ellen [name] MRN: [id number] DOB: February 01, 1941  Subjective: Ellen [name] is a 80 y.o. year old female who is a primary care patient of Jon Billings, NP. The care management team was consulted for assistance with disease management and care coordination needs.    Engaged with patient by telephone for follow up visit in response to provider referral for case management and/or care coordination services. Spoke to the patient and the patients daughter and DPR, Levada Dy   Consent to Services:  The patient was given information about Chronic Care Management services, agreed to services, and gave verbal consent prior to initiation of services.  Please see initial visit note for detailed documentation.   Patient agreed to services and verbal consent obtained.   Assessment: Review of patient past medical history, allergies, medications, health status, including review of consultants reports, laboratory and other test data, was performed as part of comprehensive evaluation and provision of chronic care management services.   SDOH (Social Determinants of Health) assessments and interventions performed:    CCM Care Plan  No Known Allergies  Outpatient Encounter Medications as of 05/18/2021  Medication Sig   amLODipine (NORVASC) 10 MG tablet Take 1 tablet (10 mg total) by mouth daily.   aspirin 81 MG tablet Take 81 mg by mouth daily.   atorvastatin (LIPITOR) 20 MG tablet Take 1 tablet (20 mg total) by mouth daily.   blood glucose meter kit and supplies KIT Dispense based on patient and insurance preference. Use up to four times daily as directed. (FOR ICD-9 250.00, 250.01).   CALCIUM-MAGNESIUM-ZINC PO Take 1 tablet by mouth daily.   calcium-vitamin D 250-100 MG-UNIT tablet Take 1 tablet by mouth 2 (two) times daily.   Cholecalciferol (VITAMIN D3) 1000 units CAPS Take 2 capsules by mouth daily.    cyclobenzaprine (FLEXERIL)  10 MG tablet Take 10 mg by mouth 2 (two) times daily as needed.   lisinopril (ZESTRIL) 20 MG tablet Take 1 tablet (20 mg total) by mouth daily.   Magnesium 500 MG TABS Take 1 tablet by mouth daily.   metFORMIN (GLUCOPHAGE) 500 MG tablet Start out taking 1 tablet (500 MG) by mouth every morning with meal, then on week two start taking 1 tablet (500 MG) by mouth in morning with meal and 1 tablet (500 MG) in evening with meal.   PFIZER-BIONT COVID-19 VAC-TRIS SUSP injection    vitamin C (ASCORBIC ACID) 500 MG tablet Take 500 mg by mouth daily.   No facility-administered encounter medications on file as of 05/18/2021.    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[place]) 03/29/2015    Conditions to be addressed/monitored:HTN, HLD, DMII, and Memory Changes    Care Plan : RNCM: General Plan of Care (Adult) for Chronic Disease Management and Care Coordination Needs  Updates made by Vanita Ingles, RN since 05/18/2021 12:00 AM     Problem: RNCM: Development of Plan of Care for Chronic Disease Management (HTN, HLD, DM2, Memory Changes)   Priority: High     Long-Range  Goal: RNCM: Effective Management of Plan of Care for Chronic Disease Management (HTN, HLD, DM2, Memory Changes)   Start Date: 05/18/2021  Expected End Date: 05/18/2022  Priority: High  Note:   Current Barriers:  Knowledge Deficits related to plan of care for management of HTN, HLD, DMII, and Memory changes   Care Coordination needs related to Memory Deficits  Chronic Disease Management support and  education needs related to HTN, HLD, DMII, and Memory changes  Family report of memory changes specifically short term memory   RNCM Clinical Goal(s):  Patient will verbalize basic understanding of HTN, HLD, DMII, and Memory Changes  disease process and self health management plan as evidenced by following the plan of care, taking medications, keeping appointments and calling the office for questions or concerns take all medications exactly as prescribed and will call provider for medication related questions as evidenced by compliance and calling for refills before running out of medications     attend all scheduled medical appointments: 06-08-2021 at 0940 am as evidenced by keeping appointments and calling for reschedule needs         demonstrate improved and ongoing adherence to prescribed treatment plan for HTN, HLD, DMII, and memory changes as evidenced by working with the pcp and CCM team to optimize the plan of care and effectively manage health and well being demonstrate a decrease in memory changes exacerbations  as evidenced by working with the pcp and CCM team to effectively manage short term memory problems  demonstrate ongoing self health care management ability for effective management of Chronic conditions  as evidenced by working with the CCM team  through collaboration with Consulting civil engineer, provider, and care team.   Interventions: 1:1 collaboration with primary care provider regarding development and update of comprehensive plan of care as evidenced by provider attestation and co-signature Inter-disciplinary care team collaboration (see longitudinal plan of care) Evaluation of current treatment plan related to  self management and patient's adherence to plan as established by provider   Diabetes:  (Status: Goal on Track (progressing): YES.) Long Term Goal   Lab Results  Component Value Date   HGBA1C 5.9 (H) 04/08/2021  Assessed patient's understanding of A1c goal:  <7% Provided education to patient about basic DM disease process; Reviewed medications with patient and discussed importance of medication adherence;        Reviewed prescribed diet with patient heart healthy/ADA diet. 05-18-2021: The patient is drinking 2 boost daily to aide in nutrition and have no further weight loss. The patient endorses no further weight loss; Counseled on importance of regular laboratory monitoring as prescribed;        Discussed plans with patient for ongoing care management follow up and provided patient with direct contact information for care management team;      Provided patient with written educational materials related to hypo and hyperglycemia and importance of correct treatment;       Reviewed scheduled/upcoming provider appointments including: 06-08-2021 at 0940 am;         Advised patient, providing education and rationale, to check cbg when you have symptoms of low or high blood sugar, before and after exercise, and as directed   and record        call provider for findings outside established parameters;       Review of patient status, including review of consultants reports, relevant laboratory and other test results, and medications completed;       Screening for signs and symptoms of  depression related to chronic disease state;        Assessed social determinant of health barriers;         Memory Changes   (Status: New goal.) Long Term Goal  Evaluation of current treatment plan related to  Memory Changes  , Memory Deficits self-management and patient's adherence to plan as established by provider. Discussed plans with patient for ongoing care management follow up and provided patient with direct contact information for care management team Advised patient to write down questions to ask the provider at upcoming visit with pcp and to communicate memory changes noted by the family; Provided education to patient re: cognitive changes to look for, importance of  making sure that the patient is taking medications as directed and the importance of safety for the patient; Collaborated with pcp regarding discussion with daughter about concerns of her and her other siblings noticing changes in memory, specifically short term memory.  Advised the patients daughter, Levada Dy to discuss this with the pcp at upcoming appointment on 06-08-2021.  Levada Dy is planning on coming to the appointment with the patient. She wants to do what she can to help the patient maintain independence and get the things she needs to remain stable in her health; Provided patient with memory enhancement educational materials related to memory changes and help with resources in memory enhancement; Reviewed scheduled/upcoming provider appointments including 06-08-2021 at 0940 am; Discussed plans with patient for ongoing care management follow up and provided patient with direct contact information for care management team; Advised patient to discuss concerns and changes noted in memory  with provider; Screening for signs and symptoms of depression related to chronic disease state;  Assessed social determinant of health barriers;   Hyperlipidemia:  (Status: Goal on Track (progressing): YES.) Long Term Goal  Lab Results  Component Value Date   CHOL 142 04/08/2021   HDL 67 04/08/2021   LDLCALC 50 04/08/2021   TRIG 152 (H) 04/08/2021   CHOLHDL 2.1 04/08/2021     Medication review performed; medication list updated in electronic medical record.  Provider established cholesterol goals reviewed; Counseled on importance of regular laboratory monitoring as prescribed; Provided HLD educational materials; Reviewed role and benefits of statin for ASCVD risk reduction; Discussed strategies to manage statin-induced myalgias; Reviewed importance of limiting foods high in cholesterol;  Hypertension: (Status: Goal on Track (progressing): YES.) Last practice recorded BP readings:  BP Readings from  Last 3 Encounters:  04/08/21 132/78  03/09/21 (!) 144/82  02/23/21 (!) 162/84  Most recent eGFR/CrCl:  Lab Results  Component Value Date   EGFR 91 04/08/2021    No components found for: CRCL  Evaluation of current treatment plan related to hypertension self management and patient's adherence to plan as established by provider;   Provided education to patient re: stroke prevention, s/s of heart attack and stroke; Reviewed prescribed diet heart healthy/ADA diet  Reviewed medications with patient and discussed importance of compliance;  Discussed plans with patient for ongoing care management follow up and provided patient with direct contact information for care management team; Advised patient, providing education and rationale, to monitor blood pressure daily and record, calling PCP for findings outside established parameters;  Advised patient to discuss blood pressure changes  with provider; Provided education on prescribed diet heart healthy/ADA diet ;  Discussed complications of poorly controlled blood pressure such as heart disease, stroke, circulatory complications, vision complications, kidney impairment, sexual dysfunction;   Patient Goals/Self-Care Activities: Take medications as prescribed   Attend  all scheduled provider appointments Call pharmacy for medication refills 3-7 days in advance of running out of medications Attend church or other social activities Perform all self care activities independently  Perform IADL's (shopping, preparing meals, housekeeping, managing finances) independently Call provider office for new concerns or questions  Work with the social worker to address care coordination needs and will continue to work with the clinical team to address health care and disease management related needs call the Suicide and Crisis Lifeline: 988 call the [place] National Suicide Prevention Lifeline: (365)596-5223 or TTY: 2062473947 TTY (972)355-5656) to talk to a  trained counselor call 1-800-273-TALK (toll free, 24 hour hotline) if experiencing a Mental Health or [place]  schedule appointment with eye doctor check blood sugar at prescribed times: when you have symptoms of low or high blood sugar, before and after exercise, and as directed by pcp  check feet daily for cuts, sores or redness enter blood sugar readings and medication or insulin into daily log take the blood sugar log to all doctor visits trim toenails straight across drink 6 to 8 glasses of water each day eat fish at least once per week fill half of plate with vegetables limit fast food meals to no more than 1 per week manage portion size prepare main meal at home 3 to 5 days each week read food labels for fat, fiber, carbohydrates and portion size reduce red meat to 2 to 3 times a week keep feet up while sitting wash and dry feet carefully every day wear comfortable, cotton socks wear comfortable, well-fitting shoes check blood pressure weekly choose a place to take my blood pressure (home, clinic or office, retail store) write blood pressure results in a log or diary learn about high blood pressure keep a blood pressure log take blood pressure log to all doctor appointments call doctor for signs and symptoms of high blood pressure develop an action plan for high blood pressure keep all doctor appointments take medications for blood pressure exactly as prescribed report new symptoms to your doctor eat more whole grains, fruits and vegetables, lean meats and healthy fats - call for medicine refill 2 or 3 days before it runs out - take all medications exactly as prescribed - call doctor with any symptoms you believe are related to your medicine - call doctor when you experience any new symptoms - go to all doctor appointments as scheduled - adhere to prescribed diet: Heart healthy/ADA       Plan:Telephone follow up appointment with care management team  member scheduled for:  07-26-2021 at [place] am  Noreene Larsson RN, MSN, [facility] Mobile: [phone]

## 2021-06-07 NOTE — Progress Notes (Signed)
BP 133/73 (BP Location: Left Arm, Cuff Size: Normal)    Pulse 81    Temp 98.8 F (37.1 C) (Oral)    Ht 5' 0.9" (1.547 m)    Wt 148 lb (67.1 kg)    LMP 11/28/1994 (Approximate)    SpO2 98%    BMI 28.06 kg/m    Subjective:    Patient ID: Ellen [name], female    DOB: [birth year], 80 y.o.   MRN: [id number]  HPI: Ellen [name] is a 80 y.o. female  Chief Complaint  Patient presents with   Weight Check   Memory Loss   HYPERTENSION / HYPERLIPIDEMIA Satisfied with current treatment? no Duration of hypertension: years BP monitoring frequency: weekly BP range: 120-130 BP medication side effects: no Past BP meds: amlodipine and lisinopril Duration of hyperlipidemia: years Cholesterol medication side effects: no Cholesterol supplements: none Past cholesterol medications: atorvastain (lipitor) Medication compliance: excellent compliance Aspirin: yes Recent stressors: no Recurrent headaches: no Visual changes: no Palpitations: no Dyspnea: no Chest pain: no Lower extremity edema: no Dizzy/lightheaded: no  DIABETES Hypoglycemic episodes:no Polydipsia/polyuria: no Visual disturbance: no Chest pain: no Paresthesias: no Glucose Monitoring: no  Accucheck frequency: Not Checking  Fasting glucose:  Post prandial:  Evening:  Before meals: Taking Insulin?: no  Long acting insulin:  Short acting insulin: Blood Pressure Monitoring: weekly Retinal Examination: Not up to Date Foot Exam: Up to Date Diabetic Education: Not Completed Pneumovax: Up to Date Influenza: Up to Date Aspirin: yes  MEMORY CHANGES Patient's daughter is present with patient during visit. She would like to have patient evaluated.   MMSE - Mini Mental State Exam 06/08/2021  Orientation to time 5  Orientation to Place 5  Registration 3  Attention/ Calculation 5  Recall 2  Language- name 2 objects 2  Language- repeat 1  Language- follow 3 step command 3  Language- read & follow direction 1   Write a sentence 1  Copy design 0  Total score 28       Relevant past medical, surgical, family and social history reviewed and updated as indicated. Interim medical history since our last visit reviewed. Allergies and medications reviewed and updated.  Review of Systems  Eyes:  Negative for visual disturbance.  Respiratory:  Negative for chest tightness and shortness of breath.   Cardiovascular:  Negative for chest pain, palpitations and leg swelling.  Endocrine: Negative for polydipsia and polyuria.  Neurological:  Negative for dizziness, light-headedness, numbness and headaches.       Memory changes   Per HPI unless specifically indicated above     Objective:    BP 133/73 (BP Location: Left Arm, Cuff Size: Normal)    Pulse 81    Temp 98.8 F (37.1 C) (Oral)    Ht 5' 0.9" (1.547 m)    Wt 148 lb (67.1 kg)    LMP 11/28/1994 (Approximate)    SpO2 98%    BMI 28.06 kg/m   Wt Readings from Last 3 Encounters:  06/08/21 148 lb (67.1 kg)  04/08/21 148 lb 3.2 oz (67.2 kg)  03/09/21 144 lb 3.2 oz (65.4 kg)    Physical Exam Vitals and nursing note reviewed.  Constitutional:      General: She is not in acute distress.    Appearance: Normal appearance. She is normal weight. She is not ill-appearing, toxic-appearing or diaphoretic.  HENT:     Head: Normocephalic.     Right Ear: External ear normal.     Left Ear: External  ear normal.     Nose: Nose normal.     Mouth/Throat:     Mouth: Mucous membranes are moist.     Pharynx: Oropharynx is clear.  Eyes:     General:        Right eye: No discharge.        Left eye: No discharge.     Extraocular Movements: Extraocular movements intact.     Conjunctiva/sclera: Conjunctivae normal.     Pupils: Pupils are equal, round, and reactive to light.  Cardiovascular:     Rate and Rhythm: Normal rate and regular rhythm.     Heart sounds: No murmur heard. Pulmonary:     Effort: Pulmonary effort is normal. No respiratory distress.      Breath sounds: Normal breath sounds. No wheezing or rales.  Musculoskeletal:     Cervical back: Normal range of motion and neck supple.  Skin:    General: Skin is warm and dry.     Capillary Refill: Capillary refill takes less than 2 seconds.  Neurological:     General: No focal deficit present.     Mental Status: She is alert and oriented to person, place, and time. Mental status is at baseline.  Psychiatric:        Mood and Affect: Mood normal.        Behavior: Behavior normal.        Thought Content: Thought content normal.        Judgment: Judgment normal.    Results for orders placed or performed in visit on 04/08/21  Comp Met (CMET)  Result Value Ref Range   Glucose 93 70 - 99 mg/dL   BUN 19 8 - 27 mg/dL   Creatinine, Ser 0.63 0.57 - 1.00 mg/dL   eGFR 91 >59 mL/min/1.73   BUN/Creatinine Ratio 30 (H) 12 - 28   Sodium 142 134 - 144 mmol/L   Potassium 4.0 3.5 - 5.2 mmol/L   Chloride 103 96 - 106 mmol/L   CO2 23 20 - 29 mmol/L   Calcium 9.7 8.7 - 10.3 mg/dL   Total Protein 6.8 6.0 - 8.5 g/dL   Albumin 4.2 3.7 - 4.7 g/dL   Globulin, Total 2.6 1.5 - 4.5 g/dL   Albumin/Globulin Ratio 1.6 1.2 - 2.2   Bilirubin Total <0.2 0.0 - 1.2 mg/dL   Alkaline Phosphatase 96 44 - 121 IU/L   AST 22 0 - 40 IU/L   ALT 15 0 - 32 IU/L  HgB A1c  Result Value Ref Range   Hgb A1c MFr Bld 5.9 (H) 4.8 - 5.6 %   Est. average glucose Bld gHb Est-mCnc 123 mg/dL  Lipid Profile  Result Value Ref Range   Cholesterol, Total 142 100 - 199 mg/dL   Triglycerides 152 (H) 0 - 149 mg/dL   HDL 67 >39 mg/dL   VLDL Cholesterol Cal 25 5 - 40 mg/dL   LDL Chol Calc (NIH) 50 0 - 99 mg/dL   Chol/HDL Ratio 2.1 0.0 - 4.4 ratio      Assessment & Plan:   Problem List Items Addressed This Visit       Cardiovascular and Mediastinum   Hypertension associated with diabetes ([place]) - Primary    Chronic.  Controlled.  Continue with current medication regimen on Amlodipine 67m and Lisinopril 257m  Labs ordered  today.  Refills sent today.  Return to clinic in 3 months for reevaluation.  Call sooner if concerns arise.  Relevant Medications   metFORMIN (GLUCOPHAGE) 500 MG tablet   atorvastatin (LIPITOR) 20 MG tablet   Other Relevant Orders   Comp Met (CMET)   Atherosclerosis of aorta ([facility])    Chronic.  Controlled.  Continue with current medication regimen of Atorvastatin 37m.  Labs ordered today.  Refill sent today.  Return to clinic in 3 months for reevaluation.  Call sooner if concerns arise.       Relevant Medications   atorvastatin (LIPITOR) 20 MG tablet     Endocrine   Type 2 diabetes [place] proteinuria [place]    Labs ordered today.  Controlled on Metformin 5039mBID. Refill sent today. Will make recommendations based on lab results.       Relevant Medications   metFORMIN (GLUCOPHAGE) 500 MG tablet   atorvastatin (LIPITOR) 20 MG tablet   Other Relevant Orders   Lipid Profile   Hyperlipidemia associated with type 2 diabetes mellitus ([facility])    Chronic.  Controlled.  Continue with current medication regimen of Atorvastatin 2028m Labs ordered today.  Refill sent today.  Return to clinic in 3 months for reevaluation.  Call sooner if concerns arise.         Relevant Medications   metFORMIN (GLUCOPHAGE) 500 MG tablet   atorvastatin (LIPITOR) 20 MG tablet   Other Relevant Orders   HgB A1c     Other   Weight loss    Patient has not lost additional weight.  Weighed 148lbs at visit today. Discussed with patient to continue with protein supplements as well as eating regular meals. Will continue to assess at future visits.      Other Visit Diagnoses     Memory changes       MMSE scored 28. Discussed memory games and cross word puzzels. Will continue to monitor at future visits.        Follow up plan: Return in about 3 months (around 09/06/2021) for HTN, HLD, DM2 FU.

## 2021-06-08 ENCOUNTER — Other Ambulatory Visit: Payer: Self-pay

## 2021-06-08 ENCOUNTER — Encounter: Payer: Self-pay | Admitting: Nurse Practitioner

## 2021-06-08 ENCOUNTER — Ambulatory Visit (INDEPENDENT_AMBULATORY_CARE_PROVIDER_SITE_OTHER): Payer: Medicare Other | Admitting: Nurse Practitioner

## 2021-06-08 VITALS — BP 133/73 | HR 81 | Temp 98.8°F | Ht 60.9 in | Wt 148.0 lb

## 2021-06-08 DIAGNOSIS — R634 Abnormal weight loss: Secondary | ICD-10-CM

## 2021-06-08 DIAGNOSIS — I152 Hypertension secondary to endocrine disorders: Secondary | ICD-10-CM | POA: Diagnosis not present

## 2021-06-08 DIAGNOSIS — R809 Proteinuria, unspecified: Secondary | ICD-10-CM | POA: Diagnosis not present

## 2021-06-08 DIAGNOSIS — R413 Other amnesia: Secondary | ICD-10-CM | POA: Diagnosis not present

## 2021-06-08 DIAGNOSIS — E1159 Type 2 diabetes mellitus with other circulatory complications: Secondary | ICD-10-CM

## 2021-06-08 DIAGNOSIS — E1129 Type 2 diabetes mellitus with other diabetic kidney complication: Secondary | ICD-10-CM

## 2021-06-08 DIAGNOSIS — E785 Hyperlipidemia, unspecified: Secondary | ICD-10-CM | POA: Diagnosis not present

## 2021-06-08 DIAGNOSIS — I700 Atherosclerosis of aorta: Secondary | ICD-10-CM

## 2021-06-08 DIAGNOSIS — E1169 Type 2 diabetes mellitus with other specified complication: Secondary | ICD-10-CM | POA: Diagnosis not present

## 2021-06-08 MED ORDER — IBUPROFEN 800 MG PO TABS: 800.0000 mg | ORAL_TABLET | Freq: Three times a day (TID) | ORAL | 1 refills | Status: DC | PRN

## 2021-06-08 MED ORDER — ATORVASTATIN CALCIUM 20 MG PO TABS: 20.0000 mg | ORAL_TABLET | Freq: Every day | ORAL | 4 refills | Status: DC

## 2021-06-08 MED ORDER — METFORMIN HCL 500 MG PO TABS: ORAL_TABLET | ORAL | 3 refills | Status: DC

## 2021-06-08 NOTE — Assessment & Plan Note (Signed)
Chronic.  Controlled.  Continue with current medication regimen of Atorvastatin 20mg .  Labs ordered today.  Refill sent today.  Return to clinic in 3 months for reevaluation.  Call sooner if concerns arise.

## 2021-06-08 NOTE — Assessment & Plan Note (Signed)
Labs ordered today.  Controlled on Metformin 500mg  BID. Refill sent today. Will make recommendations based on lab results.

## 2021-06-08 NOTE — Assessment & Plan Note (Signed)
Chronic.  Controlled.  Continue with current medication regimen of Atorvastatin 20mg.  Labs ordered today.  Refill sent today.  Return to clinic in 3 months for reevaluation.  Call sooner if concerns arise.  °

## 2021-06-08 NOTE — Assessment & Plan Note (Signed)
Patient has not lost additional weight.  Weighed 148lbs at visit today. Discussed with patient to continue with protein supplements as well as eating regular meals. Will continue to assess at future visits.

## 2021-06-08 NOTE — Assessment & Plan Note (Signed)
Chronic.  Controlled.  Continue with current medication regimen on Amlodipine 10mg  and Lisinopril 20mg .  Labs ordered today.  Refills sent today.  Return to clinic in 3 months for reevaluation.  Call sooner if concerns arise.

## 2021-06-09 LAB — COMPREHENSIVE METABOLIC PANEL
ALT: 17 IU/L (ref 0–32)
AST: 24 IU/L (ref 0–40)
Albumin/Globulin Ratio: 2.1 (ref 1.2–2.2)
Albumin: 4.4 g/dL (ref 3.7–4.7)
Alkaline Phosphatase: 114 IU/L (ref 44–121)
BUN/Creatinine Ratio: 26 (ref 12–28)
BUN: 16 mg/dL (ref 8–27)
Bilirubin Total: <0.2 mg/dL (ref 0.0–1.2)
CO2: 24 mmol/L (ref 20–29)
Calcium: 9.4 mg/dL (ref 8.7–10.3)
Chloride: 105 mmol/L (ref 96–106)
Creatinine, Ser: 0.62 mg/dL (ref 0.57–1.00)
Globulin, Total: 2.1 g/dL (ref 1.5–4.5)
Glucose: 91 mg/dL (ref 70–99)
Potassium: 4.2 mmol/L (ref 3.5–5.2)
Sodium: 143 mmol/L (ref 134–144)
Total Protein: 6.5 g/dL (ref 6.0–8.5)
eGFR: 92 mL/min/{1.73_m2} (ref >59)

## 2021-06-09 LAB — HEMOGLOBIN A1C
Est. average glucose Bld gHb Est-mCnc: 131 mg/dL
Hgb A1c MFr Bld: 6.2 % — ABNORMAL HIGH (ref 4.8–5.6)

## 2021-06-09 LAB — LIPID PANEL
Chol/HDL Ratio: 2.2 ratio (ref 0.0–4.4)
Cholesterol, Total: 164 mg/dL (ref 100–199)
HDL: 73 mg/dL (ref >39)
LDL Chol Calc (NIH): 74 mg/dL (ref 0–99)
Triglycerides: 94 mg/dL (ref 0–149)
VLDL Cholesterol Cal: 17 mg/dL (ref 5–40)

## 2021-06-09 NOTE — Progress Notes (Signed)
Please let patient know that her lab work looks good.  Her A1c increased slightly to 6.2.  I am not concerned at this time. We will continue to check it in the future.  Please let me know if she has any questions.

## 2021-06-22 ENCOUNTER — Ambulatory Visit (INDEPENDENT_AMBULATORY_CARE_PROVIDER_SITE_OTHER): Payer: Medicare Other | Admitting: Family Medicine

## 2021-06-22 ENCOUNTER — Other Ambulatory Visit: Payer: Self-pay

## 2021-06-22 ENCOUNTER — Encounter: Payer: Self-pay | Admitting: Family Medicine

## 2021-06-22 VITALS — BP 137/75 | HR 54 | Temp 98.1°F | Wt 148.0 lb

## 2021-06-22 DIAGNOSIS — R519 Headache, unspecified: Secondary | ICD-10-CM

## 2021-06-22 MED ORDER — BACLOFEN 10 MG PO TABS: 10.0000 mg | ORAL_TABLET | Freq: Every evening | ORAL | 0 refills | Status: DC | PRN

## 2021-06-22 MED ORDER — PREDNISONE 50 MG PO TABS: 50.0000 mg | ORAL_TABLET | Freq: Every day | ORAL | 0 refills | Status: DC

## 2021-06-22 MED ORDER — KETOROLAC TROMETHAMINE 60 MG/2ML IM SOLN
60.0000 mg | Freq: Once | INTRAMUSCULAR | Status: AC
  Administered 2021-06-22: 60 mg via INTRAMUSCULAR

## 2021-06-22 NOTE — Progress Notes (Signed)
BP 137/75    Pulse (!) 54    Temp 98.1 F (36.7 C)    Wt 148 lb (67.1 kg)    LMP 11/28/1994 (Approximate)    SpO2 98%    BMI 28.06 kg/m    Subjective:    Patient ID: Ellen [name], female    DOB: 09/03/1940, 81 y.o.   MRN: [id number]  HPI: Ellen [name] is a 81 y.o. female  Chief Complaint  Patient presents with   Spasms    Patient states she is having spasms in the back of her head, and neck for about 2-3 days. Patient also feels pain between her eyes    HEADACHE Duration: 3 days Onset: sudden Severity: severe Quality: sharp, throbbing Frequency: intermittent Location: forehead and face Headache duration: 3 days Radiation: no Headache status at time of visit: current headache Treatments attempted: rest, APAP, and ibuprofen   Aura: no Nausea:  no Vomiting: no Photophobia:  no Phonophobia:  no Effect on social functioning:  no Confusion:  no Gait disturbance/ataxia:  no Behavioral changes:  no Fevers:  no  Relevant past medical, surgical, family and social history reviewed and updated as indicated. Interim medical history since our last visit reviewed. Allergies and medications reviewed and updated.  Review of Systems  Constitutional: Negative.   HENT: Negative.    Respiratory: Negative.    Cardiovascular: Negative.   Genitourinary: Negative.   Musculoskeletal:  Positive for myalgias, neck pain and neck stiffness. Negative for arthralgias, back pain, gait problem and joint swelling.  Skin: Negative.   Psychiatric/Behavioral: Negative.     Per HPI unless specifically indicated above     Objective:    BP 137/75    Pulse (!) 54    Temp 98.1 F (36.7 C)    Wt 148 lb (67.1 kg)    LMP 11/28/1994 (Approximate)    SpO2 98%    BMI 28.06 kg/m   Wt Readings from Last 3 Encounters:  06/22/21 148 lb (67.1 kg)  06/08/21 148 lb (67.1 kg)  04/08/21 148 lb 3.2 oz (67.2 kg)    Physical Exam Vitals and nursing note reviewed.  Constitutional:      General: She  is not in acute distress.    Appearance: Normal appearance. She is not ill-appearing, toxic-appearing or diaphoretic.  HENT:     Head: Normocephalic and atraumatic.     Right Ear: External ear normal.     Left Ear: External ear normal.     Nose: Nose normal.     Mouth/Throat:     Mouth: Mucous membranes are moist.     Pharynx: Oropharynx is clear.  Eyes:     General: No scleral icterus.       Right eye: No discharge.        Left eye: No discharge.     Extraocular Movements: Extraocular movements intact.     Conjunctiva/sclera: Conjunctivae normal.     Pupils: Pupils are equal, round, and reactive to light.  Cardiovascular:     Rate and Rhythm: Normal rate and regular rhythm.     Pulses: Normal pulses.     Heart sounds: Normal heart sounds. No murmur heard.   No friction rub. No gallop.  Pulmonary:     Effort: Pulmonary effort is normal. No respiratory distress.     Breath sounds: Normal breath sounds. No stridor. No wheezing, rhonchi or rales.  Chest:     Chest wall: No tenderness.  Musculoskeletal:  General: Tenderness (neck paraspinals and maxillary sinuses) present. No swelling, deformity or signs of injury.     Cervical back: Normal range of motion and neck supple.     Right lower leg: No edema.     Left lower leg: No edema.     Comments: Neck spams, tenderness over frontal sinuses  Skin:    General: Skin is warm and dry.     Capillary Refill: Capillary refill takes less than 2 seconds.     Coloration: Skin is not jaundiced or pale.     Findings: No bruising, erythema, lesion or rash.  Neurological:     General: No focal deficit present.     Mental Status: She is alert and oriented to person, place, and time. Mental status is at baseline.  Psychiatric:        Mood and Affect: Mood normal.        Behavior: Behavior normal.        Thought Content: Thought content normal.        Judgment: Judgment normal.    Results for orders placed or performed in visit on  06/08/21  Comp Met (CMET)  Result Value Ref Range   Glucose 91 70 - 99 mg/dL   BUN 16 8 - 27 mg/dL   Creatinine, Ser 0.62 0.57 - 1.00 mg/dL   eGFR 92 >59 mL/min/1.73   BUN/Creatinine Ratio 26 12 - 28   Sodium 143 134 - 144 mmol/L   Potassium 4.2 3.5 - 5.2 mmol/L   Chloride 105 96 - 106 mmol/L   CO2 24 20 - 29 mmol/L   Calcium 9.4 8.7 - 10.3 mg/dL   Total Protein 6.5 6.0 - 8.5 g/dL   Albumin 4.4 3.7 - 4.7 g/dL   Globulin, Total 2.1 1.5 - 4.5 g/dL   Albumin/Globulin Ratio 2.1 1.2 - 2.2   Bilirubin Total <0.2 0.0 - 1.2 mg/dL   Alkaline Phosphatase 114 44 - 121 IU/L   AST 24 0 - 40 IU/L   ALT 17 0 - 32 IU/L  HgB A1c  Result Value Ref Range   Hgb A1c MFr Bld 6.2 (H) 4.8 - 5.6 %   Est. average glucose Bld gHb Est-mCnc 131 mg/dL  Lipid Profile  Result Value Ref Range   Cholesterol, Total 164 100 - 199 mg/dL   Triglycerides 94 0 - 149 mg/dL   HDL 73 >39 mg/dL   VLDL Cholesterol Cal 17 5 - 40 mg/dL   LDL Chol Calc (NIH) 74 0 - 99 mg/dL   Chol/HDL Ratio 2.2 0.0 - 4.4 ratio      Assessment & Plan:   Problem List Items Addressed This Visit   None Visit Diagnoses     Nonintractable headache, unspecified chronicity pattern, unspecified headache type    -  Primary   Likely due to sinus pressure and hypertonic neck muscles. Toradol today, start steroid tomorrow and baclofen at night. Call with any concerns. Recheck 1 week.    Relevant Medications   ketorolac (TORADOL) injection 60 mg (Completed)   baclofen (LIORESAL) 10 MG tablet        Follow up plan: Return in about 1 week (around 06/29/2021).

## 2021-06-29 NOTE — Progress Notes (Signed)
BP 133/81 (BP Location: Left Arm, Cuff Size: Normal)    Pulse 90    Temp 98.4 F (36.9 C) (Oral)    Wt 146 lb 12.8 oz (66.6 kg)    LMP 11/28/1994 (Approximate)    SpO2 95%    BMI 27.83 kg/m    Subjective:    Patient ID: Ellen [name], female    DOB: Feb 01, 1941, 81 y.o.   MRN: [id number]  HPI: Ellen [name] is a 81 y.o. female  Chief Complaint  Patient presents with   Headache    1 week f/up- pt states the headaches have gotten better. States she finished Prednisone and is still taking Baclofen    HEADACHE Patient states her headaches have gotten better. She finished the prednisone and taking the baclofen at night which is helping with her symptoms.  States she hasn't been having headaches since she finished the medications.  Denies concerns at visit today.    Relevant past medical, surgical, family and social history reviewed and updated as indicated. Interim medical history since our last visit reviewed. Allergies and medications reviewed and updated.  Review of Systems  Musculoskeletal:  Negative for neck pain.  Neurological:  Negative for headaches.   Per HPI unless specifically indicated above     Objective:    BP 133/81 (BP Location: Left Arm, Cuff Size: Normal)    Pulse 90    Temp 98.4 F (36.9 C) (Oral)    Wt 146 lb 12.8 oz (66.6 kg)    LMP 11/28/1994 (Approximate)    SpO2 95%    BMI 27.83 kg/m   Wt Readings from Last 3 Encounters:  06/30/21 146 lb 12.8 oz (66.6 kg)  06/22/21 148 lb (67.1 kg)  06/08/21 148 lb (67.1 kg)    Physical Exam Vitals and nursing note reviewed.  Constitutional:      General: She is not in acute distress.    Appearance: Normal appearance. She is normal weight. She is not ill-appearing, toxic-appearing or diaphoretic.  HENT:     Head: Normocephalic.     Right Ear: External ear normal.     Left Ear: External ear normal.     Nose: Nose normal.     Mouth/Throat:     Mouth: Mucous membranes are moist.     Pharynx: Oropharynx is  clear.  Eyes:     General:        Right eye: No discharge.        Left eye: No discharge.     Extraocular Movements: Extraocular movements intact.     Conjunctiva/sclera: Conjunctivae normal.     Pupils: Pupils are equal, round, and reactive to light.  Cardiovascular:     Rate and Rhythm: Normal rate and regular rhythm.     Heart sounds: No murmur heard. Pulmonary:     Effort: Pulmonary effort is normal. No respiratory distress.     Breath sounds: Normal breath sounds. No wheezing or rales.  Musculoskeletal:     Cervical back: Normal range of motion and neck supple.  Skin:    General: Skin is warm and dry.     Capillary Refill: Capillary refill takes less than 2 seconds.  Neurological:     General: No focal deficit present.     Mental Status: She is alert and oriented to person, place, and time. Mental status is at baseline.  Psychiatric:        Mood and Affect: Mood normal.        Behavior:  Behavior normal.        Thought Content: Thought content normal.        Judgment: Judgment normal.    Results for orders placed or performed in visit on 06/08/21  Comp Met (CMET)  Result Value Ref Range   Glucose 91 70 - 99 mg/dL   BUN 16 8 - 27 mg/dL   Creatinine, Ser 0.62 0.57 - 1.00 mg/dL   eGFR 92 >59 mL/min/1.73   BUN/Creatinine Ratio 26 12 - 28   Sodium 143 134 - 144 mmol/L   Potassium 4.2 3.5 - 5.2 mmol/L   Chloride 105 96 - 106 mmol/L   CO2 24 20 - 29 mmol/L   Calcium 9.4 8.7 - 10.3 mg/dL   Total Protein 6.5 6.0 - 8.5 g/dL   Albumin 4.4 3.7 - 4.7 g/dL   Globulin, Total 2.1 1.5 - 4.5 g/dL   Albumin/Globulin Ratio 2.1 1.2 - 2.2   Bilirubin Total <0.2 0.0 - 1.2 mg/dL   Alkaline Phosphatase 114 44 - 121 IU/L   AST 24 0 - 40 IU/L   ALT 17 0 - 32 IU/L  HgB A1c  Result Value Ref Range   Hgb A1c MFr Bld 6.2 (H) 4.8 - 5.6 %   Est. average glucose Bld gHb Est-mCnc 131 mg/dL  Lipid Profile  Result Value Ref Range   Cholesterol, Total 164 100 - 199 mg/dL   Triglycerides 94 0 -  149 mg/dL   HDL 73 >39 mg/dL   VLDL Cholesterol Cal 17 5 - 40 mg/dL   LDL Chol Calc (NIH) 74 0 - 99 mg/dL   Chol/HDL Ratio 2.2 0.0 - 4.4 ratio      Assessment & Plan:   Problem List Items Addressed This Visit   None Visit Diagnoses     Nonintractable headache, unspecified chronicity pattern, unspecified headache type    -  Primary   Improved since last visit. Can stop Baclofen. If headaches, muscle soreness return can take Baclofen PRN for muscle tension to prevent headaches.        Follow up plan: Return if symptoms worsen or fail to improve.

## 2021-06-30 ENCOUNTER — Other Ambulatory Visit: Payer: Self-pay

## 2021-06-30 ENCOUNTER — Encounter: Payer: Self-pay | Admitting: Nurse Practitioner

## 2021-06-30 ENCOUNTER — Ambulatory Visit (INDEPENDENT_AMBULATORY_CARE_PROVIDER_SITE_OTHER): Payer: Medicare Other | Admitting: Nurse Practitioner

## 2021-06-30 VITALS — BP 133/81 | HR 90 | Temp 98.4°F | Wt 146.8 lb

## 2021-06-30 DIAGNOSIS — R519 Headache, unspecified: Secondary | ICD-10-CM | POA: Diagnosis not present

## 2021-07-26 ENCOUNTER — Ambulatory Visit (INDEPENDENT_AMBULATORY_CARE_PROVIDER_SITE_OTHER): Payer: Medicare Other

## 2021-07-26 ENCOUNTER — Telehealth: Payer: Medicare Other

## 2021-07-26 DIAGNOSIS — R413 Other amnesia: Secondary | ICD-10-CM

## 2021-07-26 DIAGNOSIS — I152 Hypertension secondary to endocrine disorders: Secondary | ICD-10-CM

## 2021-07-26 DIAGNOSIS — E785 Hyperlipidemia, unspecified: Secondary | ICD-10-CM

## 2021-07-26 DIAGNOSIS — E1129 Type 2 diabetes mellitus with other diabetic kidney complication: Secondary | ICD-10-CM

## 2021-07-26 DIAGNOSIS — E1169 Type 2 diabetes mellitus with other specified complication: Secondary | ICD-10-CM

## 2021-07-26 LAB — HM DIABETES EYE EXAM

## 2021-07-26 NOTE — Patient Instructions (Signed)
Visit Information  Thank you for taking time to visit with me today. Please don't hesitate to contact me if I can be of assistance to you before our next scheduled telephone appointment.  Following are the goals we discussed today:  ( RNCM Clinical Goal(s):  Patient will verbalize basic understanding of HTN, HLD, DMII, and Memory Changes  disease process and self health management plan as evidenced by following the plan of care, taking medications, keeping appointments and calling the office for questions or concerns take all medications exactly as prescribed and will call provider for medication related questions as evidenced by compliance and calling for refills before running out of medications     attend all scheduled medical appointments: 09-06-2021 at 10am, RNCM will anticipate seeing the patient this day as well, discussed on outreach today, as evidenced by keeping appointments and calling for reschedule needs         demonstrate improved and ongoing adherence to prescribed treatment plan for HTN, HLD, DMII, and memory changes as evidenced by working with the pcp and CCM team to optimize the plan of care and effectively manage health and well being demonstrate a decrease in memory changes exacerbations  as evidenced by working with the pcp and CCM team to effectively manage short term memory problems  demonstrate ongoing self health care management ability for effective management of Chronic conditions  as evidenced by working with the CCM team  through collaboration with Consulting civil engineer, provider, and care team.    Interventions: 1:1 collaboration with primary care provider regarding development and update of comprehensive plan of care as evidenced by provider attestation and co-signature Inter-disciplinary care team collaboration (see longitudinal plan of care) Evaluation of current treatment plan related to  self management and patient's adherence to plan as established by provider      Diabetes:  (Status: Goal on Track (progressing): YES.) Long Term Goal         Lab Results  Component Value Date    HGBA1C 6.2 (H) 06/08/2021  Previous A1C 5.9 Assessed patient's understanding of A1c goal: <7% Provided education to patient about basic DM disease process; Reviewed medications with patient and discussed importance of medication adherence;        Reviewed prescribed diet with patient heart healthy/ADA diet. 07-26-2021: The patient is drinking 2 boost daily to aide in nutrition and have no further weight loss. The patient endorses no further weight loss; Counseled on importance of regular laboratory monitoring as prescribed. 07-26-2021: The patient has current lab testing on a routine basis. Last on 06-08-2021;        Discussed plans with patient for ongoing care management follow up and provided patient with direct contact information for care management team;      Provided patient with written educational materials related to hypo and hyperglycemia and importance of correct treatment;       Reviewed scheduled/upcoming provider appointments including: 09-06-2021 at 10 am;         Advised patient, providing education and rationale, to check cbg when you have symptoms of low or high blood sugar, before and after exercise, and as directed   and record. 07-26-2021: The patient denies any issues with her blood sugars. States she is doing well.       call provider for findings outside established parameters;       Review of patient status, including review of consultants reports, relevant laboratory and other test results, and medications completed;  Screening for signs and symptoms of depression related to chronic disease state;        Assessed social determinant of health barriers;        Had eye exam today (07-26-2021). States all was good. No changes in her vision. The doctor actually told her that she did not need glasses. The patient was happy that her eye exam went well. Provided  information to the patients daughter Levada Dy that her eye exam was good.   Memory Changes   (Status: Goal on Track (progressing): YES.) Long Term Goal  Evaluation of current treatment plan related to  Memory Changes  , Memory Deficits self-management and patient's adherence to plan as established by provider. 07-26-2021: The patient saw the pcp in December, accompanied by daughter. The patient scored a 28 on her memory test. The provider recommended crossword puzzles and memory games. The patients daughter states that she is satisfied with the results. She states that she and other members of the family can see if more. She states that her long term memory is good but her short term memory is what she sees some deficits in but it is not bad. The patient will often ask her several times about appointments and other things.  No acute findings today. Will continue to monitor for changes or needs.  Discussed plans with patient for ongoing care management follow up and provided patient with direct contact information for care management team Advised patient to write down questions to ask the provider at upcoming visit with pcp and to communicate memory changes noted by the family; Provided education to patient re: cognitive changes to look for, importance of making sure that the patient is taking medications as directed and the importance of safety for the patient; Collaborated with pcp regarding discussion with daughter about concerns of her and her other siblings noticing changes in memory, specifically short term memory.  Advised the patients daughter, Levada Dy to discuss this with the pcp at upcoming appointment on 06-08-2021.  Levada Dy is planning on coming to the appointment with the patient. She wants to do what she can to help the patient maintain independence and get the things she needs to remain stable in her health. 07-26-2021: The patient states that she is doing well. The daughter is pleased with the  evaluation of the patients memory. The pcp addressed this in December at her appointment and will evaluate ongoing at subsequent visits.  Provided patient with memory enhancement educational materials related to memory changes and help with resources in memory enhancement; Reviewed scheduled/upcoming provider appointments including 09-06-2021 at 10 am; Discussed plans with patient for ongoing care management follow up and provided patient with direct contact information for care management team; Advised patient to discuss concerns and changes noted in memory  with provider; Screening for signs and symptoms of depression related to chronic disease state;  Assessed social determinant of health barriers;    Hyperlipidemia:  (Status: Goal on Track (progressing): YES.) Long Term Goal       Lab Results  Component Value Date    CHOL 164 06/08/2021    HDL 73 06/08/2021    LDLCALC 74 06/08/2021    TRIG 94 06/08/2021    CHOLHDL 2.2 06/08/2021      Medication review performed; medication list updated in electronic medical record. 07-26-2021: The patient is compliant with Lipitor 20 mg daily. Provider established cholesterol goals reviewed. 07-26-2021: Praised the patient for being at goal with her cholesterol levels; Counseled on importance of regular  laboratory monitoring as prescribed. 07-26-2021: has regular lab work. Last in December of 2022 Provided HLD educational materials; Reviewed role and benefits of statin for ASCVD risk reduction; Discussed strategies to manage statin-induced myalgias; Reviewed importance of limiting foods high in cholesterol. 07-26-2021: Review of heart healthy/ADA diet;   Hypertension: (Status: Goal on Track (progressing): YES.) Last practice recorded BP readings:     BP Readings from Last 3 Encounters:  06/30/21 133/81  06/22/21 137/75  06/08/21 133/73  Most recent eGFR/CrCl:       Lab Results  Component Value Date    EGFR 92 06/08/2021    No components found for:  CRCL   Evaluation of current treatment plan related to hypertension self management and patient's adherence to plan as established by provider. 07-26-2021: The patient is doing well and denies any issues with her blood pressures or HTN health. Was having some headaches and saw pcp x 2 in January for evaluation and treatment options. The pcp felt it was likely sinus related. The patient denies headaches or other issues today. Will continue to monitor for changes;   Provided education to patient re: stroke prevention, s/s of heart attack and stroke; Reviewed prescribed diet heart healthy/ADA diet  Reviewed medications with patient and discussed importance of compliance. 07-26-2021: The patient is compliant with medications  Discussed plans with patient for ongoing care management follow up and provided patient with direct contact information for care management team; Advised patient, providing education and rationale, to monitor blood pressure daily and record, calling PCP for findings outside established parameters;  Advised patient to discuss blood pressure changes  with provider; Provided education on prescribed diet heart healthy/ADA diet ;  Discussed complications of poorly controlled blood pressure such as heart disease, stroke, circulatory complications, vision complications, kidney impairment, sexual dysfunction;    Patient Goals/Self-Care Activities: Take medications as prescribed   Attend all scheduled provider appointments Call pharmacy for medication refills 3-7 days in advance of running out of medications Attend church or other social activities Perform all self care activities independently  Perform IADL's (shopping, preparing meals, housekeeping, managing finances) independently Call provider office for new concerns or questions  Work with the social worker to address care coordination needs and will continue to work with the clinical team to address health care and disease management  related needs call the Suicide and Crisis Lifeline: 988 call the [place] National Suicide Prevention Lifeline: 8570672249 or TTY: 415-403-2102 TTY 636-429-5760) to talk to a trained counselor call 1-800-273-TALK (toll free, 24 hour hotline) if experiencing a Mental Health or [place]  schedule appointment with eye doctor check blood sugar at prescribed times: when you have symptoms of low or high blood sugar, before and after exercise, and as directed by pcp  check feet daily for cuts, sores or redness enter blood sugar readings and medication or insulin into daily log take the blood sugar log to all doctor visits trim toenails straight across drink 6 to 8 glasses of water each day eat fish at least once per week fill half of plate with vegetables limit fast food meals to no more than 1 per week manage portion size prepare main meal at home 3 to 5 days each week read food labels for fat, fiber, carbohydrates and portion size reduce red meat to 2 to 3 times a week keep feet up while sitting wash and dry feet carefully every day wear comfortable, cotton socks wear comfortable, well-fitting shoes check blood pressure weekly choose a place  to take my blood pressure (home, clinic or office, retail store) write blood pressure results in a log or diary learn about high blood pressure keep a blood pressure log take blood pressure log to all doctor appointments call doctor for signs and symptoms of high blood pressure develop an action plan for high blood pressure keep all doctor appointments take medications for blood pressure exactly as prescribed report new symptoms to your doctor eat more whole grains, fruits and vegetables, lean meats and healthy fats - call for medicine refill 2 or 3 days before it runs out - take all medications exactly as prescribed - call doctor with any symptoms you believe are related to your medicine - call doctor when you experience any new  symptoms - go to all doctor appointments as scheduled - adhere to prescribed diet: Heart healthy/ADA          Our next appointment is in-person at [facility] office on 09-06-2021 at 1030 am, during regular pcp visit  Please call the care guide team at [phone] if you need to cancel or reschedule your appointment.   If you are experiencing a Mental Health or [place] or need someone to talk to, please call the Suicide and Crisis Lifeline: 988 call the [place] National Suicide Prevention Lifeline: 931-201-8756 or TTY: (272)706-8274 TTY (630)080-1719) to talk to a trained counselor call 1-800-273-TALK (toll free, 24 hour hotline)   Patient verbalizes understanding of instructions and care plan provided today and agrees to view in [place]. Active MyChart status confirmed with patient.    Noreene Larsson RN, MSN, [facility] Mobile: [phone]

## 2021-07-26 NOTE — Chronic Care Management (AMB) (Signed)
Chronic Care Management   CCM RN Visit Note  07/26/2021 Name: Ellen [name] MRN: [id number] DOB: [birth year]  Subjective: Ellen [name] is a 81 y.o. year old female who is a primary care patient of Jon Billings, NP. The care management team was consulted for assistance with disease management and care coordination needs.    Engaged with patient by telephone for follow up visit in response to provider referral for case management and/or care coordination services.   Consent to Services:  The patient was given information about Chronic Care Management services, agreed to services, and gave verbal consent prior to initiation of services.  Please see initial visit note for detailed documentation.   Patient agreed to services and verbal consent obtained.   Assessment: Review of patient past medical history, allergies, medications, health status, including review of consultants reports, laboratory and other test data, was performed as part of comprehensive evaluation and provision of chronic care management services.   SDOH (Social Determinants of Health) assessments and interventions performed:    CCM Care Plan  No Known Allergies  Outpatient Encounter Medications as of 07/26/2021  Medication Sig   amLODipine (NORVASC) 10 MG tablet Take 1 tablet (10 mg total) by mouth daily.   aspirin 81 MG tablet Take 81 mg by mouth daily.   atorvastatin (LIPITOR) 20 MG tablet Take 1 tablet (20 mg total) by mouth daily.   baclofen (LIORESAL) 10 MG tablet Take 1 tablet (10 mg total) by mouth at bedtime as needed for muscle spasms.   blood glucose meter kit and supplies KIT Dispense based on patient and insurance preference. Use up to four times daily as directed. (FOR ICD-9 250.00, 250.01).   CALCIUM-MAGNESIUM-ZINC PO Take 1 tablet by mouth daily.   calcium-vitamin D 250-100 MG-UNIT tablet Take 1 tablet by mouth 2 (two) times daily.   Cholecalciferol (VITAMIN D3) 1000 units CAPS Take 2 capsules  by mouth daily.    ibuprofen (ADVIL) 800 MG tablet Take 1 tablet (800 mg total) by mouth every 8 (eight) hours as needed.   lisinopril (ZESTRIL) 20 MG tablet Take 1 tablet (20 mg total) by mouth daily.   Magnesium 500 MG TABS Take 1 tablet by mouth daily.   metFORMIN (GLUCOPHAGE) 500 MG tablet Start out taking 1 tablet (500 MG) by mouth every morning with meal, then on week two start taking 1 tablet (500 MG) by mouth in morning with meal and 1 tablet (500 MG) in evening with meal.   vitamin C (ASCORBIC ACID) 500 MG tablet Take 500 mg by mouth daily.   No facility-administered encounter medications on file as of 07/26/2021.    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[place]) 03/29/2015    Conditions to be addressed/monitored:HTN, HLD, DMII, and Memory Changes  Care Plan : RNCM: General Plan of Care (Adult) for Chronic Disease Management and Care Coordination Needs  Updates made by Vanita Ingles, RN since 07/26/2021 12:00 AM     Problem: RNCM: Development of Plan of Care for Chronic Disease Management (HTN, HLD, DM2, Memory Changes)   Priority: High  Long-Range Goal: RNCM: Effective Management of Plan of Care for Chronic Disease Management (HTN, HLD, DM2, Memory Changes)   Start Date: 05/18/2021  Expected End Date: 05/18/2022  Priority: High  Note:   Current Barriers:  Knowledge Deficits related to plan of care for management of HTN, HLD, DMII, and Memory changes   Care Coordination needs related to Memory Deficits  Chronic Disease Management support  and education needs related to HTN, HLD, DMII, and Memory changes  Family report of memory changes specifically short term memory   RNCM Clinical Goal(s):  Patient will verbalize basic understanding of HTN, HLD, DMII, and Memory Changes  disease process and self health management plan as evidenced by following the plan of care, taking medications, keeping appointments and calling the office for questions or concerns take all medications exactly as prescribed and will call provider for medication related questions as evidenced by compliance and calling for refills before running out of medications     attend all scheduled medical appointments: 09-06-2021 at 10am, RNCM will anticipate seeing the patient this day as well, discussed on outreach today, as evidenced by keeping appointments and calling for reschedule needs         demonstrate improved and ongoing adherence to prescribed treatment plan for HTN, HLD, DMII, and memory changes as evidenced by working with the pcp and CCM team to optimize the plan of care and effectively manage health and well being demonstrate a decrease in memory changes exacerbations  as evidenced by working with the pcp and CCM team to effectively manage short term memory problems  demonstrate ongoing self health care management ability for effective management of Chronic conditions  as evidenced by working with the CCM team  through collaboration with Consulting civil engineer, provider, and care team.   Interventions: 1:1 collaboration with primary care provider regarding development and update of comprehensive plan of care as evidenced by provider attestation and co-signature Inter-disciplinary care team collaboration (see longitudinal plan of care) Evaluation of current treatment plan related to  self management and patient's adherence to plan as established by provider   Diabetes:  (Status: Goal on Track (progressing): YES.) Long Term Goal   Lab Results  Component Value Date    HGBA1C 6.2 (H) 06/08/2021  Previous A1C 5.9 Assessed patient's understanding of A1c goal: <7% Provided education to patient about basic DM disease process; Reviewed medications with patient and discussed importance of medication adherence;        Reviewed prescribed diet with patient heart healthy/ADA diet. 07-26-2021: The patient is drinking 2 boost daily to aide in nutrition and have no further weight loss. The patient endorses no further weight loss; Counseled on importance of regular laboratory monitoring as prescribed. 07-26-2021: The patient has current lab testing on a routine basis. Last on 06-08-2021;        Discussed plans with patient for ongoing care management follow up and provided patient with direct contact information for care management team;      Provided patient with written educational materials related to hypo and hyperglycemia and importance of correct treatment;       Reviewed scheduled/upcoming provider appointments including: 09-06-2021 at 10 am;         Advised patient, providing education and rationale, to check cbg when you have symptoms of low or high blood sugar, before and after exercise, and as directed   and record. 07-26-2021: The patient denies any issues with her blood sugars. States she is doing well.  call provider for findings outside established parameters;       Review of patient status, including review of consultants reports, relevant laboratory and other test results, and medications completed;       Screening for signs and symptoms of depression related to chronic disease state;        Assessed social determinant of health barriers;        Had eye exam today (07-26-2021). States all was good. No changes in her vision. The doctor actually told her that she did not need glasses. The patient was happy that her eye exam went well. Provided information to the patients daughter Levada Dy that her eye exam was good.  Memory Changes   (Status: Goal on Track  (progressing): YES.) Long Term Goal  Evaluation of current treatment plan related to  Memory Changes  , Memory Deficits self-management and patient's adherence to plan as established by provider. 07-26-2021: The patient saw the pcp in December, accompanied by daughter. The patient scored a 28 on her memory test. The provider recommended crossword puzzles and memory games. The patients daughter states that she is satisfied with the results. She states that she and other members of the family can see if more. She states that her long term memory is good but her short term memory is what she sees some deficits in but it is not bad. The patient will often ask her several times about appointments and other things.  No acute findings today. Will continue to monitor for changes or needs.  Discussed plans with patient for ongoing care management follow up and provided patient with direct contact information for care management team Advised patient to write down questions to ask the provider at upcoming visit with pcp and to communicate memory changes noted by the family; Provided education to patient re: cognitive changes to look for, importance of making sure that the patient is taking medications as directed and the importance of safety for the patient; Collaborated with pcp regarding discussion with daughter about concerns of her and her other siblings noticing changes in memory, specifically short term memory.  Advised the patients daughter, Levada Dy to discuss this with the pcp at upcoming appointment on 06-08-2021.  Levada Dy is planning on coming to the appointment with the patient. She wants to do what she can to help the patient maintain independence and get the things she needs to remain stable in her health. 07-26-2021: The patient states that she is doing well. The daughter is pleased with the evaluation of the patients memory. The pcp addressed this in December at her appointment and will evaluate ongoing at  subsequent visits.  Provided patient with memory enhancement educational materials related to memory changes and help with resources in memory enhancement; Reviewed scheduled/upcoming provider appointments including 09-06-2021 at 10 am; Discussed plans with patient for ongoing care management follow up and provided patient with direct contact information for care management team; Advised patient to discuss concerns and changes noted in memory  with provider; Screening for signs and symptoms of depression related to chronic disease state;  Assessed social determinant of health barriers;   Hyperlipidemia:  (Status: Goal on Track (progressing): YES.) Long Term Goal  Lab Results  Component Value Date   CHOL 164 06/08/2021   HDL 73 06/08/2021   LDLCALC 74 06/08/2021   TRIG 94 06/08/2021   CHOLHDL 2.2 06/08/2021     Medication review performed; medication list updated in electronic medical record. 07-26-2021: The patient is compliant with Lipitor 20  mg daily. Provider established cholesterol goals reviewed. 07-26-2021: Praised the patient for being at goal with her cholesterol levels; Counseled on importance of regular laboratory monitoring as prescribed. 07-26-2021: has regular lab work. Last in December of 2022 Provided HLD educational materials; Reviewed role and benefits of statin for ASCVD risk reduction; Discussed strategies to manage statin-induced myalgias; Reviewed importance of limiting foods high in cholesterol. 07-26-2021: Review of heart healthy/ADA diet;  Hypertension: (Status: Goal on Track (progressing): YES.) Last practice recorded BP readings:  BP Readings from Last 3 Encounters:  06/30/21 133/81  06/22/21 137/75  06/08/21 133/73  Most recent eGFR/CrCl:  Lab Results  Component Value Date   EGFR 92 06/08/2021    No components found for: CRCL  Evaluation of current treatment plan related to hypertension self management and patient's adherence to plan as established by provider.  07-26-2021: The patient is doing well and denies any issues with her blood pressures or HTN health. Was having some headaches and saw pcp x 2 in January for evaluation and treatment options. The pcp felt it was likely sinus related. The patient denies headaches or other issues today. Will continue to monitor for changes;   Provided education to patient re: stroke prevention, s/s of heart attack and stroke; Reviewed prescribed diet heart healthy/ADA diet  Reviewed medications with patient and discussed importance of compliance. 07-26-2021: The patient is compliant with medications  Discussed plans with patient for ongoing care management follow up and provided patient with direct contact information for care management team; Advised patient, providing education and rationale, to monitor blood pressure daily and record, calling PCP for findings outside established parameters;  Advised patient to discuss blood pressure changes  with provider; Provided education on prescribed diet heart healthy/ADA diet ;  Discussed complications of poorly controlled blood pressure such as heart disease, stroke, circulatory complications, vision complications, kidney impairment, sexual dysfunction;   Patient Goals/Self-Care Activities: Take medications as prescribed   Attend all scheduled provider appointments Call pharmacy for medication refills 3-7 days in advance of running out of medications Attend church or other social activities Perform all self care activities independently  Perform IADL's (shopping, preparing meals, housekeeping, managing finances) independently Call provider office for new concerns or questions  Work with the social worker to address care coordination needs and will continue to work with the clinical team to address health care and disease management related needs call the Suicide and Crisis Lifeline: 988 call the [place] National Suicide Prevention Lifeline: 9794680293 or TTY: (715) 353-9985 TTY  252 173 8754) to talk to a trained counselor call 1-800-273-TALK (toll free, 24 hour hotline) if experiencing a Mental Health or [place]  schedule appointment with eye doctor check blood sugar at prescribed times: when you have symptoms of low or high blood sugar, before and after exercise, and as directed by pcp  check feet daily for cuts, sores or redness enter blood sugar readings and medication or insulin into daily log take the blood sugar log to all doctor visits trim toenails straight across drink 6 to 8 glasses of water each day eat fish at least once per week fill half of plate with vegetables limit fast food meals to no more than 1 per week manage portion size prepare main meal at home 3 to 5 days each week read food labels for fat, fiber, carbohydrates and portion size reduce red meat to 2 to 3 times a week keep feet up while sitting wash and dry feet carefully every day wear comfortable, cotton  socks wear comfortable, well-fitting shoes check blood pressure weekly choose a place to take my blood pressure (home, clinic or office, retail store) write blood pressure results in a log or diary learn about high blood pressure keep a blood pressure log take blood pressure log to all doctor appointments call doctor for signs and symptoms of high blood pressure develop an action plan for high blood pressure keep all doctor appointments take medications for blood pressure exactly as prescribed report new symptoms to your doctor eat more whole grains, fruits and vegetables, lean meats and healthy fats - call for medicine refill 2 or 3 days before it runs out - take all medications exactly as prescribed - call doctor with any symptoms you believe are related to your medicine - call doctor when you experience any new symptoms - go to all doctor appointments as scheduled - adhere to prescribed diet: Heart healthy/ADA       Plan:Face to Face appointment with  care management team member scheduled for: 09-06-2021 at 61 am  [place], MSN, [facility] Mobile: [phone]

## 2021-08-16 DIAGNOSIS — E1129 Type 2 diabetes mellitus with other diabetic kidney complication: Secondary | ICD-10-CM

## 2021-08-16 DIAGNOSIS — I152 Hypertension secondary to endocrine disorders: Secondary | ICD-10-CM

## 2021-08-16 DIAGNOSIS — E1169 Type 2 diabetes mellitus with other specified complication: Secondary | ICD-10-CM

## 2021-08-16 DIAGNOSIS — R809 Proteinuria, unspecified: Secondary | ICD-10-CM

## 2021-08-16 DIAGNOSIS — E785 Hyperlipidemia, unspecified: Secondary | ICD-10-CM | POA: Diagnosis not present

## 2021-08-16 DIAGNOSIS — E1159 Type 2 diabetes mellitus with other circulatory complications: Secondary | ICD-10-CM | POA: Diagnosis not present

## 2021-09-06 ENCOUNTER — Ambulatory Visit: Payer: Medicare Other

## 2021-09-06 ENCOUNTER — Ambulatory Visit (INDEPENDENT_AMBULATORY_CARE_PROVIDER_SITE_OTHER): Payer: Medicare Other

## 2021-09-06 ENCOUNTER — Ambulatory Visit (INDEPENDENT_AMBULATORY_CARE_PROVIDER_SITE_OTHER): Payer: Medicare Other | Admitting: Nurse Practitioner

## 2021-09-06 ENCOUNTER — Other Ambulatory Visit: Payer: Self-pay

## 2021-09-06 ENCOUNTER — Encounter: Payer: Self-pay | Admitting: Nurse Practitioner

## 2021-09-06 VITALS — BP 120/84 | HR 76 | Temp 98.5°F | Wt 149.6 lb

## 2021-09-06 DIAGNOSIS — I700 Atherosclerosis of aorta: Secondary | ICD-10-CM

## 2021-09-06 DIAGNOSIS — I152 Hypertension secondary to endocrine disorders: Secondary | ICD-10-CM | POA: Diagnosis not present

## 2021-09-06 DIAGNOSIS — E1159 Type 2 diabetes mellitus with other circulatory complications: Secondary | ICD-10-CM

## 2021-09-06 DIAGNOSIS — E1129 Type 2 diabetes mellitus with other diabetic kidney complication: Secondary | ICD-10-CM | POA: Diagnosis not present

## 2021-09-06 DIAGNOSIS — R413 Other amnesia: Secondary | ICD-10-CM

## 2021-09-06 DIAGNOSIS — E1169 Type 2 diabetes mellitus with other specified complication: Secondary | ICD-10-CM

## 2021-09-06 DIAGNOSIS — R809 Proteinuria, unspecified: Secondary | ICD-10-CM

## 2021-09-06 DIAGNOSIS — E785 Hyperlipidemia, unspecified: Secondary | ICD-10-CM | POA: Diagnosis not present

## 2021-09-06 MED ORDER — LISINOPRIL 20 MG PO TABS: 20.0000 mg | ORAL_TABLET | Freq: Every day | ORAL | 1 refills | Status: DC

## 2021-09-06 MED ORDER — AMLODIPINE BESYLATE 10 MG PO TABS: 10.0000 mg | ORAL_TABLET | Freq: Every day | ORAL | 1 refills | Status: DC

## 2021-09-06 NOTE — Progress Notes (Signed)
? ?BP 120/84   Pulse 76   Temp 98.5 ?F (36.9 ?C) (Oral)   Wt 149 lb 9.6 oz (67.9 kg)   LMP 11/28/1994 (Approximate)   SpO2 99%   BMI 28.36 kg/m?   ? ?Subjective:  ? ? Patient ID: Ellen [name], female    DOB: [birth year], 81 y.o.   MRN: [id number] ? ?HPI: ?Ellen [name] is a 81 y.o. female ? ?Chief Complaint  ?Patient presents with  ? Hypertension  ? ?HYPERTENSION / HYPERLIPIDEMIA ?Satisfied with current treatment? no ?Duration of hypertension: years ?BP monitoring frequency: weekly ?BP range: 127-130/80 ?BP medication side effects: no ?Past BP meds: amlodipine and lisinopril ?Duration of hyperlipidemia: years ?Cholesterol medication side effects: no ?Cholesterol supplements: none ?Past cholesterol medications: atorvastain (lipitor) ?Medication compliance: excellent compliance ?Aspirin: yes ?Recent stressors: no ?Recurrent headaches: no ?Visual changes: no ?Palpitations: no ?Dyspnea: no ?Chest pain: no ?Lower extremity edema: no ?Dizzy/lightheaded: no ? ?DIABETES ?Hypoglycemic episodes:no ?Polydipsia/polyuria: no ?Visual disturbance: no ?Chest pain: no ?Paresthesias: no ?Glucose Monitoring: no ? Accucheck frequency: Not Checking ? Fasting glucose: ? Post prandial: ? Evening: ? Before meals: ?Taking Insulin?: no ? Long acting insulin: ? Short acting insulin: ?Blood Pressure Monitoring: weekly ?Retinal Examination: Not up to Date ?Foot Exam: Up to Date ?Diabetic Education: Not Completed ?Pneumovax: Up to Date ?Influenza: Up to Date ?Aspirin: yes ? ? ? ? ?Relevant past medical, surgical, family and social history reviewed and updated as indicated. Interim medical history since our last visit reviewed. ?Allergies and medications reviewed and updated. ? ?Review of Systems  ?Eyes:  Negative for visual disturbance.  ?Respiratory:  Negative for chest tightness and shortness of breath.   ?Cardiovascular:  Negative for chest pain, palpitations and leg swelling.  ?Endocrine: Negative for polydipsia and polyuria.   ?Neurological:  Negative for dizziness, light-headedness, numbness and headaches.  ?     Memory changes  ? ?Per HPI unless specifically indicated above ? ?   ?Objective:  ?  ?BP 120/84   Pulse 76   Temp 98.5 ?F (36.9 ?C) (Oral)   Wt 149 lb 9.6 oz (67.9 kg)   LMP 11/28/1994 (Approximate)   SpO2 99%   BMI 28.36 kg/m?   ?Wt Readings from Last 3 Encounters:  ?09/06/21 149 lb 9.6 oz (67.9 kg)  ?06/30/21 146 lb 12.8 oz (66.6 kg)  ?06/22/21 148 lb (67.1 kg)  ?  ?Physical Exam ?Vitals and nursing note reviewed.  ?Constitutional:   ?   General: She is not in acute distress. ?   Appearance: Normal appearance. She is normal weight. She is not ill-appearing, toxic-appearing or diaphoretic.  ?HENT:  ?   Head: Normocephalic.  ?   Right Ear: External ear normal.  ?   Left Ear: External ear normal.  ?   Nose: Nose normal.  ?   Mouth/Throat:  ?   Mouth: Mucous membranes are moist.  ?   Pharynx: Oropharynx is clear.  ?Eyes:  ?   General:     ?   Right eye: No discharge.     ?   Left eye: No discharge.  ?   Extraocular Movements: Extraocular movements intact.  ?   Conjunctiva/sclera: Conjunctivae normal.  ?   Pupils: Pupils are equal, round, and reactive to light.  ?Cardiovascular:  ?   Rate and Rhythm: Normal rate and regular rhythm.  ?   Heart sounds: No murmur heard. ?Pulmonary:  ?   Effort: Pulmonary effort is normal. No respiratory distress.  ?  Breath sounds: Normal breath sounds. No wheezing or rales.  ?Musculoskeletal:  ?   Cervical back: Normal range of motion and neck supple.  ?Skin: ?   General: Skin is warm and dry.  ?   Capillary Refill: Capillary refill takes less than 2 seconds.  ?Neurological:  ?   General: No focal deficit present.  ?   Mental Status: She is alert and oriented to person, place, and time. Mental status is at baseline.  ?Psychiatric:     ?   Mood and Affect: Mood normal.     ?   Behavior: Behavior normal.     ?   Thought Content: Thought content normal.     ?   Judgment: Judgment normal.   ? ? ?Results for orders placed or performed in visit on 06/08/21  ?Comp Met (CMET)  ?Result Value Ref Range  ? Glucose 91 70 - 99 mg/dL  ? BUN 16 8 - 27 mg/dL  ? Creatinine, Ser 0.62 0.57 - 1.00 mg/dL  ? eGFR 92 >59 mL/min/1.73  ? BUN/Creatinine Ratio 26 12 - 28  ? Sodium 143 134 - 144 mmol/L  ? Potassium 4.2 3.5 - 5.2 mmol/L  ? Chloride 105 96 - 106 mmol/L  ? CO2 24 20 - 29 mmol/L  ? Calcium 9.4 8.7 - 10.3 mg/dL  ? Total Protein 6.5 6.0 - 8.5 g/dL  ? Albumin 4.4 3.7 - 4.7 g/dL  ? Globulin, Total 2.1 1.5 - 4.5 g/dL  ? Albumin/Globulin Ratio 2.1 1.2 - 2.2  ? Bilirubin Total <0.2 0.0 - 1.2 mg/dL  ? Alkaline Phosphatase 114 44 - 121 IU/L  ? AST 24 0 - 40 IU/L  ? ALT 17 0 - 32 IU/L  ?HgB A1c  ?Result Value Ref Range  ? Hgb A1c MFr Bld 6.2 (H) 4.8 - 5.6 %  ? Est. average glucose Bld gHb Est-mCnc 131 mg/dL  ?Lipid Profile  ?Result Value Ref Range  ? Cholesterol, Total 164 100 - 199 mg/dL  ? Triglycerides 94 0 - 149 mg/dL  ? HDL 73 >39 mg/dL  ? VLDL Cholesterol Cal 17 5 - 40 mg/dL  ? LDL Chol Calc (NIH) 74 0 - 99 mg/dL  ? Chol/HDL Ratio 2.2 0.0 - 4.4 ratio  ? ?   ?Assessment & Plan:  ? ?Problem List Items Addressed This Visit   ? ?  ? Cardiovascular and Mediastinum  ? Hypertension associated with diabetes ([place]) - Primary  ?  Chronic.  Controlled.  Continue with current medication regimen on Amlodipine 60m and Lisinoprol 258m  Refills sent today.  Labs ordered today.  Return to clinic in 3 months for reevaluation.  Call sooner if concerns arise.  ? ?  ?  ? Relevant Medications  ? amLODipine (NORVASC) 10 MG tablet  ? lisinopril (ZESTRIL) 20 MG tablet  ? Other Relevant Orders  ? Comp Met (CMET)  ? [place] ?  Chronic.  Controlled.  Continue with current medication regimen of Atorvastatin 2027m Labs ordered today.  Not due for refill today.  Return to clinic in 3 months for reevaluation.  Call sooner if concerns arise.  ?  ?  ? Relevant Medications  ? amLODipine (NORVASC) 10 MG tablet  ? lisinopril  (ZESTRIL) 20 MG tablet  ?  ? Endocrine  ? Type 2 diabetes mellitus with proteinuria ([facility])  ?  Chronic.  Last A1c was 6.2.  Labs ordered today.  Controlled on Metformin 500m51mD. Not due for  refills today. Will make recommendations based on lab results.  ?  ?  ? Relevant Medications  ? lisinopril (ZESTRIL) 20 MG tablet  ? Other Relevant Orders  ? HgB A1c  ? Hyperlipidemia associated with type 2 diabetes mellitus ([place])  ?  Chronic.  Controlled.  Continue with current medication regimen of Atorvastatin 68m.  Labs ordered today.  Refill sent today.  Return to clinic in 3 months for reevaluation.  Call sooner if concerns arise.  ? ? ?  ?  ? Relevant Medications  ? amLODipine (NORVASC) 10 MG tablet  ? lisinopril (ZESTRIL) 20 MG tablet  ? Other Relevant Orders  ? Lipid Profile  ?  ? ?Follow up plan: ?Return in about 3 months (around 12/07/2021) for HTN, HLD, DM2 FU. ? ? ? ? ? ? ?

## 2021-09-06 NOTE — Patient Instructions (Signed)
Visit Information ? ?Thank you for taking time to visit with me today. Please don't hesitate to contact me if I can be of assistance to you before our next scheduled telephone appointment. ? ?Following are the goals we discussed today:  ?RNCM Clinical Goal(s):  ?Patient will verbalize basic understanding of HTN, HLD, DMII, and Memory Changes  disease process and self health management plan as evidenced by following the plan of care, taking medications, keeping appointments and calling the office for questions or concerns ?take all medications exactly as prescribed and will call provider for medication related questions as evidenced by compliance and calling for refills before running out of medications     ?attend all scheduled medical appointments: 09-06-2021: saw pcp today as well as RNCM. Next follow up appointment with pcp is scheduled for 12-07-2021 at 0940 am, as evidenced by keeping appointments and calling for reschedule needs         ?demonstrate improved and ongoing adherence to prescribed treatment plan for HTN, HLD, DMII, and memory changes as evidenced by working with the pcp and CCM team to optimize the plan of care and effectively manage health and well being ?demonstrate a decrease in memory changes exacerbations  as evidenced by working with the pcp and CCM team to effectively manage short term memory problems  ?demonstrate ongoing self health care management ability for effective management of Chronic conditions  as evidenced by working with the CCM team  through collaboration with Medical illustratorN Care manager, provider, and care team.  ?  ?Interventions: ?1:1 collaboration with primary care provider regarding development and update of comprehensive plan of care as evidenced by provider attestation and co-signature ?Inter-disciplinary care team collaboration (see longitudinal plan of care) ?Evaluation of current treatment plan related to  self management and patient's adherence to plan as established by  provider ?  ?  ?Diabetes:  (Status: Goal on Track (progressing): YES.) Long Term Goal  ?  ?     ?Lab Results  ?Component Value Date  ?  HGBA1C 6.2 (H) 06/08/2021  ?Previous A1C 5.9. 09-06-2021: New lab work collected today, results pending ?Assessed patient's understanding of A1c goal: <7% ?Provided education to patient about basic DM disease process; ?Reviewed medications with patient and discussed importance of medication adherence. 09-06-2021: The patient endorses compliance with medications. Denies any issues with medication management;        ?Reviewed prescribed diet with patient heart healthy/ADA diet. 07-26-2021: The patient is drinking 2 boost daily to aide in nutrition and have no further weight loss. The patient endorses no further weight loss. 09-06-2021: The patients weight today was 149. She is drinking boost at home. Gave the patient samples of Glucerna with coupons at face to face visit with the patient. Agricultural engineerducational material and calendar provided [facility] ; ?Counseled on importance of regular laboratory monitoring as prescribed. 07-26-2021: The patient has current lab testing on a routine basis. Last on 06-08-2021. 09-06-2021: New lab work drawn today, pending results.        ?Discussed plans with patient for ongoing care management follow up and provided patient with direct contact information for care management team;      ?Provided patient with written educational materials related to hypo and hyperglycemia and importance of correct treatment. 09-06-2021: The patient denies any episodes of hypo or hyperglycemia. States her readings have been good;       ?Reviewed scheduled/upcoming provider appointments including: 12-07-2021 at 940 am;         ?Advised  patient, providing education and rationale, to check cbg when you have symptoms of low or high blood sugar, before and after exercise, and as directed   and record. 07-26-2021: The patient denies any issues with her blood sugars. States she  is doing well. 09-06-2021: The patient states she is doing well with her DM management. She did not have any readings available today to review. Education and support given.       ?call provider for findings outside established parameters;       ?Review of patient status, including review of consultants reports, relevant laboratory and other test results, and medications completed. 09-06-2021: New lab work drawn today. Pending results       ?Screening for signs and symptoms of depression related to chronic disease state;        ?Assessed social determinant of health barriers;        ?Had eye exam today (07-26-2021). States all was good. No changes in her vision. The doctor actually told her that she did not need glasses. The patient was happy that her eye exam went well. Provided information to the patients daughter Levada Dy that her eye exam was good. ?  ?Memory Changes   (Status: Goal on Track (progressing): YES.) Long Term Goal  ?Evaluation of current treatment plan related to  Memory Changes  , Memory Deficits self-management and patient's adherence to plan as established by provider. 07-26-2021: The patient saw the pcp in December, accompanied by daughter. The patient scored a 28 on her memory test. The provider recommended crossword puzzles and memory games. The patients daughter states that she is satisfied with the results. She states that she and other members of the family can see if more. She states that her long term memory is good but her short term memory is what she sees some deficits in but it is not bad. The patient will often ask her several times about appointments and other things.  No acute findings today. Will continue to monitor for changes or needs. 09-06-2021: The patient denies any changes in her memory. The patient states she feels she is doing well and denies any acute findings today. Will continue to monitor for changes or new needs.  ?Discussed plans with patient for ongoing care management  follow up and provided patient with direct contact information for care management team ?Advised patient to write down questions to ask the provider at upcoming visit with pcp and to communicate memory changes noted by the family; ?Provided education to patient re: cognitive changes to look for, importance of making sure that the patient is taking medications as directed and the importance of safety for the patient; ?Collaborated with pcp regarding discussion with daughter about concerns of her and her other siblings noticing changes in memory, specifically short term memory.  Advised the patients daughter, Levada Dy to discuss this with the pcp at upcoming appointment on 06-08-2021.  Levada Dy is planning on coming to the appointment with the patient. She wants to do what she can to help the patient maintain independence and get the things she needs to remain stable in her health. 07-26-2021: The patient states that she is doing well. The daughter is pleased with the evaluation of the patients memory. The pcp addressed this in December at her appointment and will evaluate ongoing at subsequent visits.  ?Provided patient with memory enhancement educational materials related to memory changes and help with resources in memory enhancement; ?Reviewed scheduled/upcoming provider appointments including 12-07-2021 at 940 am; ?Discussed plans  with patient for ongoing care management follow up and provided patient with direct contact information for care management team; ?Advised patient to discuss concerns and changes noted in memory  with provider; ?Screening for signs and symptoms of depression related to chronic disease state;  ?Assessed social determinant of health barriers;  ?  ?Hyperlipidemia:  (Status: Goal on Track (progressing): YES.) Long Term Goal  ?     ?Lab Results  ?Component Value Date  ?  CHOL 164 06/08/2021  ?  HDL 73 06/08/2021  ?  [place] 74 06/08/2021  ?  TRIG 94 06/08/2021  ?  CHOLHDL 2.2 06/08/2021  ?  09-06-2021: New lab work drawn today at visit with pcp ?  ?Medication review performed; medication list updated in electronic medical record. 09-06-2021: The patient is compliant with Lipitor 20 mg daily. ?Provider Reynold Bowen

## 2021-09-06 NOTE — Assessment & Plan Note (Signed)
Chronic.  Controlled.  Continue with current medication regimen of Atorvastatin 20mg.  Labs ordered today.  Refill sent today.  Return to clinic in 3 months for reevaluation.  Call sooner if concerns arise.  °

## 2021-09-06 NOTE — Assessment & Plan Note (Signed)
Chronic.  Last A1c was 6.2.  Labs ordered today.  Controlled on Metformin 500mg  BID. Not due for refills today. Will make recommendations based on lab results.  ?

## 2021-09-06 NOTE — Assessment & Plan Note (Signed)
Chronic.  Controlled.  Continue with current medication regimen of Atorvastatin 20mg .  Labs ordered today.  Not due for refill today.  Return to clinic in 3 months for reevaluation.  Call sooner if concerns arise.  ?

## 2021-09-06 NOTE — Chronic Care Management (AMB) (Signed)
?Chronic Care Management  ? ?CCM RN Visit Note ? ?09/06/2021 ?Name: Ellen [name] MRN: [id number] DOB: 14-Mar-1941 ? ?Subjective: ?Ellen [name] is a 81 y.o. year old female who is a primary care patient of Jon Billings, NP. The care management team was consulted for assistance with disease management and care coordination needs.   ? ?Engaged with patient by telephone for follow up visit in response to provider referral for case management and/or care coordination services.  ? ?Consent to Services:  ?The patient was given information about Chronic Care Management services, agreed to services, and gave verbal consent prior to initiation of services.  Please see initial visit note for detailed documentation.  ? ?Patient agreed to services and verbal consent obtained.  ? ?Assessment: Review of patient past medical history, allergies, medications, health status, including review of consultants reports, laboratory and other test data, was performed as part of comprehensive evaluation and provision of chronic care management services.  ? ?SDOH (Social Determinants of Health) assessments and interventions performed:   ? ?CCM Care Plan ? ?No Known Allergies ? ?Outpatient Encounter Medications as of 09/06/2021  ?Medication Sig  ? amLODipine (NORVASC) 10 MG tablet Take 1 tablet (10 mg total) by mouth daily.  ? aspirin 81 MG tablet Take 81 mg by mouth daily.  ? atorvastatin (LIPITOR) 20 MG tablet Take 1 tablet (20 mg total) by mouth daily.  ? blood glucose meter kit and supplies KIT Dispense based on patient and insurance preference. Use up to four times daily as directed. (FOR ICD-9 250.00, 250.01).  ? CALCIUM-MAGNESIUM-ZINC PO Take 1 tablet by mouth daily.  ? calcium-vitamin D 250-100 MG-UNIT tablet Take 1 tablet by mouth 2 (two) times daily.  ? Cholecalciferol (VITAMIN D3) 1000 units CAPS Take 2 capsules by mouth daily.   ? lisinopril (ZESTRIL) 20 MG tablet Take 1 tablet (20 mg total) by mouth daily.  ? Magnesium  500 MG TABS Take 1 tablet by mouth daily.  ? metFORMIN (GLUCOPHAGE) 500 MG tablet Start out taking 1 tablet (500 MG) by mouth every morning with meal, then on week two start taking 1 tablet (500 MG) by mouth in morning with meal and 1 tablet (500 MG) in evening with meal.  ? vitamin C (ASCORBIC ACID) 500 MG tablet Take 500 mg by mouth daily.  ? ?No facility-administered encounter medications on file as of 09/06/2021.  ? ? ?Patient Active Problem List  ? Diagnosis Date Noted  ? Weight loss 03/09/2021  ? Obesity 05/31/2020  ? Cervicalgia 12/03/2019  ? Hyperlipidemia associated with type 2 diabetes mellitus ([place]) 11/08/2018  ? Atherosclerosis of aorta ([place]) 11/13/2017  ? Back pain 06/13/2017  ? Muscle soreness 05/16/2017  ? Advanced care planning/counseling discussion 04/17/2017  ? Hypertension associated with diabetes ([place]) 10/04/2015  ? Menopause 03/29/2015  ? 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 ? ?Conditions to be addressed/monitored:HTN, HLD, DMII, and memory changes ? ?Care Plan : RNCM: General Plan of Care (Adult) for Chronic Disease Management and Care Coordination Needs  ?Updates made by Vanita Ingles, RN since 09/06/2021 12:00 AM  ?  ? ?Problem: RNCM: Development of Plan of Care for Chronic Disease Management (HTN, HLD, DM2, Memory Changes)   ?Priority: High  ?  ? ?Long-Range Goal: RNCM: Effective Management of Plan of Care for Chronic Disease Management (HTN, HLD, DM2, Memory Changes)   ?Start Date: 05/18/2021  ?Expected End Date: 05/18/2022  ?Priority: High  ?Note:   ?Current Barriers:  ?Knowledge Deficits  related to plan of care for management of HTN, HLD, DMII, and Memory changes   ?Care Coordination needs related to Memory Deficits  ?Chronic Disease Management support and education needs related to HTN, HLD, DMII, and Memory changes  ?Family report of memory changes specifically short term memory  ? ?RNCM Clinical Goal(s):  ?Patient will verbalize basic understanding of HTN, HLD,  DMII, and Memory Changes  disease process and self health management plan as evidenced by following the plan of care, taking medications, keeping appointments and calling the office for questions or concerns ?take all medications exactly as prescribed and will call provider for medication related questions as evidenced by compliance and calling for refills before running out of medications     ?attend all scheduled medical appointments: 09-06-2021: saw pcp today as well as RNCM. Next follow up appointment with pcp is scheduled for 12-07-2021 at 0940 am, as evidenced by keeping appointments and calling for reschedule needs         ?demonstrate improved and ongoing adherence to prescribed treatment plan for HTN, HLD, DMII, and memory changes as evidenced by working with the pcp and CCM team to optimize the plan of care and effectively manage health and well being ?demonstrate a decrease in memory changes exacerbations  as evidenced by working with the pcp and CCM team to effectively manage short term memory problems  ?demonstrate ongoing self health care management ability for effective management of Chronic conditions  as evidenced by working with the CCM team  through collaboration with Consulting civil engineer, provider, and care team.  ? ?Interventions: ?1:1 collaboration with primary care provider regarding development and update of comprehensive plan of care as evidenced by provider attestation and co-signature ?Inter-disciplinary care team collaboration (see longitudinal plan of care) ?Evaluation of current treatment plan related to  self management and patient's adherence to plan as established by provider ? ? ?Diabetes:  (Status: Goal on Track (progressing): YES.) Long Term Goal  ? ?Lab Results  ?Component Value Date  ? HGBA1C 6.2 (H) 06/08/2021  ?Previous A1C 5.9. 09-06-2021: New lab work collected today, results pending ?Assessed patient's understanding of A1c goal: <7% ?Provided education to patient about basic DM  disease process; ?Reviewed medications with patient and discussed importance of medication adherence. 09-06-2021: The patient endorses compliance with medications. Denies any issues with medication management;        ?Reviewed prescribed diet with patient heart healthy/ADA diet. 07-26-2021: The patient is drinking 2 boost daily to aide in nutrition and have no further weight loss. The patient endorses no further weight loss. 09-06-2021: The patients weight today was 149. She is drinking boost at home. Gave the patient samples of Glucerna with coupons at face to face visit with the patient. Neurosurgeon and calendar provided with [facility] contact information ; ?Counseled on importance of regular laboratory monitoring as prescribed. 07-26-2021: The patient has current lab testing on a routine basis. Last on 06-08-2021. 09-06-2021: New lab work drawn today, pending results.        ?Discussed plans with patient for ongoing care management follow up and provided patient with direct contact information for care management team;      ?Provided patient with written educational materials related to hypo and hyperglycemia and importance of correct treatment. 09-06-2021: The patient denies any episodes of hypo or hyperglycemia. States her readings have been good;       ?Reviewed scheduled/upcoming provider appointments including: 12-07-2021 at 940 am;         ?Advised  patient, providing education and rationale, to check cbg when you have symptoms of low or high blood sugar, before and after exercise, and as directed   and record. 07-26-2021: The patient denies any issues with her blood sugars. States she is doing well. 09-06-2021: The patient states she is doing well with her DM management. She did not have any readings available today to review. Education and support given.       ?call provider for findings outside established parameters;       ?Review of patient status, including review of consultants reports, relevant laboratory  and other test results, and medications completed. 09-06-2021: New lab work drawn today. Pending results       ?Screening for signs and symptoms of depression related to chronic disease state;        ?Asses

## 2021-09-06 NOTE — Assessment & Plan Note (Signed)
Chronic.  Controlled.  Continue with current medication regimen on Amlodipine 10mg  and Lisinoprol 20mg .  Refills sent today.  Labs ordered today.  Return to clinic in 3 months for reevaluation.  Call sooner if concerns arise.  ? ?

## 2021-09-07 LAB — LIPID PANEL
Chol/HDL Ratio: 2 ratio (ref 0.0–4.4)
Cholesterol, Total: 147 mg/dL (ref 100–199)
HDL: 72 mg/dL (ref >39)
LDL Chol Calc (NIH): 58 mg/dL (ref 0–99)
Triglycerides: 90 mg/dL (ref 0–149)
VLDL Cholesterol Cal: 17 mg/dL (ref 5–40)

## 2021-09-07 LAB — COMPREHENSIVE METABOLIC PANEL
ALT: 19 IU/L (ref 0–32)
AST: 26 IU/L (ref 0–40)
Albumin/Globulin Ratio: 1.7 (ref 1.2–2.2)
Albumin: 4.3 g/dL (ref 3.7–4.7)
Alkaline Phosphatase: 114 IU/L (ref 44–121)
BUN/Creatinine Ratio: 22 (ref 12–28)
BUN: 14 mg/dL (ref 8–27)
Bilirubin Total: <0.2 mg/dL (ref 0.0–1.2)
CO2: 25 mmol/L (ref 20–29)
Calcium: 9.8 mg/dL (ref 8.7–10.3)
Chloride: 101 mmol/L (ref 96–106)
Creatinine, Ser: 0.64 mg/dL (ref 0.57–1.00)
Globulin, Total: 2.6 g/dL (ref 1.5–4.5)
Glucose: 88 mg/dL (ref 70–99)
Potassium: 3.7 mmol/L (ref 3.5–5.2)
Sodium: 141 mmol/L (ref 134–144)
Total Protein: 6.9 g/dL (ref 6.0–8.5)
eGFR: 90 mL/min/{1.73_m2} (ref >59)

## 2021-09-07 LAB — HEMOGLOBIN A1C
Est. average glucose Bld gHb Est-mCnc: 117 mg/dL
Hgb A1c MFr Bld: 5.7 % — ABNORMAL HIGH (ref 4.8–5.6)

## 2021-09-07 NOTE — Progress Notes (Signed)
Please let patient know that her lab work looks good.  A1c is 5.7 which is great news.  Her liver, kidneys, and electrolytes look good.  Her cholesterol is also well controlled.  Continue with current medication regimen.  Follow up as discussed.

## 2021-09-16 DIAGNOSIS — E1110 Type 2 diabetes mellitus with ketoacidosis without coma: Secondary | ICD-10-CM

## 2021-09-16 DIAGNOSIS — I10 Essential (primary) hypertension: Secondary | ICD-10-CM

## 2021-09-16 DIAGNOSIS — E785 Hyperlipidemia, unspecified: Secondary | ICD-10-CM

## 2021-09-16 DIAGNOSIS — Z7984 Long term (current) use of oral hypoglycemic drugs: Secondary | ICD-10-CM

## 2021-10-31 ENCOUNTER — Encounter: Payer: Self-pay | Admitting: Physician Assistant

## 2021-10-31 ENCOUNTER — Ambulatory Visit (INDEPENDENT_AMBULATORY_CARE_PROVIDER_SITE_OTHER): Payer: Medicare Other | Admitting: Physician Assistant

## 2021-10-31 VITALS — BP 155/80 | HR 92 | Temp 98.3°F | Ht 60.91 in | Wt 147.2 lb

## 2021-10-31 DIAGNOSIS — R051 Acute cough: Secondary | ICD-10-CM

## 2021-10-31 NOTE — Patient Instructions (Addendum)
At this time I recommend trying over the counter Robitussin cough syrup for your cough. The regular strength Robitussin should be effective for managing this.  ? ?You can also try a second generation antihistamine such as Allegra, Claritin, Zyrtec, etc ?The generics of these are typically just as effective  ? ?Sometime drinking warm tea with honey can help relieve the urge to cough as well.  ?You can continue taking the cough drops as needed too.  ? ?If your cough does not improve with these measures in about 3-5 days please let us know so we can send in something else.  ?

## 2021-10-31 NOTE — Progress Notes (Signed)
? ? ?  ?    Acute Office Visit ? ? ?Patient: Ellen [name]   DOB: Feb 06, 1941   81 y.o. Female  MRN: [id number] ?Visit Date: 10/31/2021 ? ?Today's healthcare provider: Dani Gobble Khayden Herzberg, PA-C  ?Introduced myself to the patient as a Journalist, newspaper and provided education on APPs in clinical practice.  ? ? ?Chief Complaint  ?Patient presents with  ? Cough  ?  Since last Wednesday  ? ?Subjective  ?  ?HPI ?HPI   ? ? Cough   ? Additional comments: Since last Wednesday ? ?  ?  ?Last edited by Jerelene Redden, CMA on 10/31/2021  3:04 PM.  ?  ?  ?States she has a nonproductive cough since last Wed ?Reports she thinks its in her chest  ?She states her husband had allergies about 2 weeks ago ?She has tried cough drops and Cold-eze zinc lozenges and halls cough drops ?Aggravating: worse at night ?Alleviating: nothing  ?She is taking Lisinopril daily  ? ? ?Medications: ?Outpatient Medications Prior to Visit  ?Medication Sig  ? amLODipine (NORVASC) 10 MG tablet Take 1 tablet (10 mg total) by mouth daily.  ? aspirin 81 MG tablet Take 81 mg by mouth daily.  ? atorvastatin (LIPITOR) 20 MG tablet Take 1 tablet (20 mg total) by mouth daily.  ? blood glucose meter kit and supplies KIT Dispense based on patient and insurance preference. Use up to four times daily as directed. (FOR ICD-9 250.00, 250.01).  ? CALCIUM-MAGNESIUM-ZINC PO Take 1 tablet by mouth daily.  ? calcium-vitamin D 250-100 MG-UNIT tablet Take 1 tablet by mouth 2 (two) times daily.  ? Cholecalciferol (VITAMIN D3) 1000 units CAPS Take 2 capsules by mouth daily.   ? lisinopril (ZESTRIL) 20 MG tablet Take 1 tablet (20 mg total) by mouth daily.  ? Magnesium 500 MG TABS Take 1 tablet by mouth daily.  ? metFORMIN (GLUCOPHAGE) 500 MG tablet Start out taking 1 tablet (500 MG) by mouth every morning with meal, then on week two start taking 1 tablet (500 MG) by mouth in morning with meal and 1 tablet (500 MG) in evening with meal.  ? vitamin C (ASCORBIC ACID) 500 MG tablet Take 500 mg by  mouth daily.  ? ?No facility-administered medications prior to visit.  ? ? ?Review of Systems  ?Constitutional:  Negative for chills, diaphoresis and fever.  ?HENT:  Negative for congestion, ear pain, postnasal drip, rhinorrhea, sneezing, sore throat and trouble swallowing.   ?Respiratory:  Positive for cough. Negative for chest tightness, shortness of breath and wheezing.   ?Cardiovascular:  Negative for chest pain.  ?Gastrointestinal:  Negative for diarrhea, nausea and vomiting.  ?Musculoskeletal:  Negative for arthralgias and myalgias.  ?Neurological:  Negative for dizziness, light-headedness and headaches.  ? ? ?  Objective  ?  ?BP (!) 155/80   Pulse 92   Temp 98.3 ?F (36.8 ?C) (Oral)   Ht 5' 0.91" (1.547 m)   Wt 147 lb 3.2 oz (66.8 kg)   LMP 11/28/1994 (Approximate)   SpO2 98%   BMI 27.90 kg/m?  ? ? ?Physical Exam ?Vitals reviewed.  ?Constitutional:   ?   General: She is awake.  ?   Appearance: Normal appearance. She is well-developed, well-groomed and overweight.  ?HENT:  ?   Head: Normocephalic and atraumatic.  ?   Mouth/Throat:  ?   Lips: Pink.  ?   Mouth: Mucous membranes are moist.  ?   Pharynx: Oropharynx is clear. Uvula midline. No  pharyngeal swelling, oropharyngeal exudate, posterior oropharyngeal erythema or uvula swelling.  ?Eyes:  ?   General: Lids are normal.  ?   Extraocular Movements: Extraocular movements intact.  ?   Conjunctiva/sclera: Conjunctivae normal.  ?   Pupils: Pupils are equal, round, and reactive to light.  ?Cardiovascular:  ?   Rate and Rhythm: Normal rate and regular rhythm.  ?   Pulses: Normal pulses.  ?   Heart sounds: Normal heart sounds.  ?Pulmonary:  ?   Effort: Pulmonary effort is normal.  ?   Breath sounds: Normal breath sounds and air entry. No decreased breath sounds, wheezing, rhonchi or rales.  ?Musculoskeletal:  ?   Cervical back: Normal range of motion and neck supple.  ?   Right lower leg: No edema.  ?   Left lower leg: No edema.  ?Lymphadenopathy:  ?   Head:  ?    Right side of head: No submental or submandibular adenopathy.  ?   Left side of head: No submental or submandibular adenopathy.  ?   Upper Body:  ?   Right upper body: No supraclavicular adenopathy.  ?   Left upper body: No supraclavicular adenopathy.  ?Neurological:  ?   Mental Status: She is alert.  ?Psychiatric:     ?   Attention and Perception: Attention normal.     ?   Mood and Affect: Mood normal.     ?   Speech: Speech normal.     ?   Behavior: Behavior normal. Behavior is cooperative.  ?  ? ? ?No results found for any visits on 10/31/21. ? Assessment & Plan  ?  ? ?Problem List Items Addressed This Visit   ?None ?Visit Diagnoses   ? ? Acute cough    -  Primary ?Acute, new problem since Wed last week ?Reports nonproductive cough without any other associated symptoms ?Denies sour or off taste in her mouth, heartburn, SOB or chest tightness ?She is taking cough drops and zinc lozenges to assist with cough ?Recommend OTC Robitussin to assist along with second gen antihistamine to assist with possible sinus etiology ?Based on lack of evidence for URI  may need to keep in mind silent GERD or asthma ?May have to consider ACEi - associated cough if symptoms continue  ?Follow up as needed for persistent or worsening symptoms.  ?   ? ?  ? ? ? ?No follow-ups on file. ? ? ?I, Erin E Mecum, PA-C, have reviewed all documentation for this visit. The documentation on 10/31/21 for the exam, diagnosis, procedures, and orders are all accurate and complete. ? ? ?Erin Mecum, MHS, PA-C ?[facility]  ? ?No follow-ups on file.  ?   ? ? ? ? ? ?

## 2021-11-08 ENCOUNTER — Telehealth: Payer: Medicare Other

## 2021-11-08 ENCOUNTER — Ambulatory Visit (INDEPENDENT_AMBULATORY_CARE_PROVIDER_SITE_OTHER): Payer: Medicare Other

## 2021-11-08 DIAGNOSIS — E1169 Type 2 diabetes mellitus with other specified complication: Secondary | ICD-10-CM

## 2021-11-08 DIAGNOSIS — R413 Other amnesia: Secondary | ICD-10-CM

## 2021-11-08 DIAGNOSIS — R809 Proteinuria, unspecified: Secondary | ICD-10-CM

## 2021-11-08 DIAGNOSIS — E1159 Type 2 diabetes mellitus with other circulatory complications: Secondary | ICD-10-CM

## 2021-11-08 DIAGNOSIS — R051 Acute cough: Secondary | ICD-10-CM

## 2021-11-08 NOTE — Chronic Care Management (AMB) (Signed)
Chronic Care Management   CCM RN Visit Note  11/08/2021 Name: Ellen [name] MRN: [id number] DOB: [birth year]  Subjective: Ellen [name] is a 81 y.o. year old female who is a primary care patient of Jon Billings, NP. The care management team was consulted for assistance with disease management and care coordination needs.    Engaged with patient by telephone for follow up visit in response to provider referral for case management and/or care coordination services.   Consent to Services:  The patient was given information about Chronic Care Management services, agreed to services, and gave verbal consent prior to initiation of services.  Please see initial visit note for detailed documentation.   Patient agreed to services and verbal consent obtained.   Assessment: Review of patient past medical history, allergies, medications, health status, including review of consultants reports, laboratory and other test data, was performed as part of comprehensive evaluation and provision of chronic care management services.   SDOH (Social Determinants of Health) assessments and interventions performed:    CCM Care Plan  No Known Allergies  Outpatient Encounter Medications as of 11/08/2021  Medication Sig   amLODipine (NORVASC) 10 MG tablet Take 1 tablet (10 mg total) by mouth daily.   aspirin 81 MG tablet Take 81 mg by mouth daily.   atorvastatin (LIPITOR) 20 MG tablet Take 1 tablet (20 mg total) by mouth daily.   blood glucose meter kit and supplies KIT Dispense based on patient and insurance preference. Use up to four times daily as directed. (FOR ICD-9 250.00, 250.01).   CALCIUM-MAGNESIUM-ZINC PO Take 1 tablet by mouth daily.   calcium-vitamin D 250-100 MG-UNIT tablet Take 1 tablet by mouth 2 (two) times daily.   Cholecalciferol (VITAMIN D3) 1000 units CAPS Take 2 capsules by mouth daily.    lisinopril (ZESTRIL) 20 MG tablet Take 1 tablet (20 mg total) by mouth daily.   Magnesium  500 MG TABS Take 1 tablet by mouth daily.   metFORMIN (GLUCOPHAGE) 500 MG tablet Start out taking 1 tablet (500 MG) by mouth every morning with meal, then on week two start taking 1 tablet (500 MG) by mouth in morning with meal and 1 tablet (500 MG) in evening with meal.   vitamin C (ASCORBIC ACID) 500 MG tablet Take 500 mg by mouth daily.   No facility-administered encounter medications on file as of 11/08/2021.    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[place]) 03/29/2015    Conditions to be addressed/monitored:HTN, HLD, DMII, and memory changes and a cough  Care Plan : RNCM: General Plan of Care (Adult) for Chronic Disease Management and Care Coordination Needs  Updates made by Vanita Ingles, RN since 11/08/2021 12:00 AM     Problem: RNCM: Development of Plan of Care for Chronic Disease Management (HTN, HLD, DM2, Memory Changes)   Priority: High     Long-Range Goal: RNCM: Effective Management of Plan of Care for Chronic Disease Management (HTN, HLD, DM2, Memory Changes)   Start Date: 05/18/2021  Expected End Date: 05/18/2022  Priority: High  Note:   Current Barriers:  Knowledge Deficits related to plan of care for management of HTN, HLD, DMII, and Memory changes   Care Coordination needs related to Memory Deficits  Chronic Disease Management support and education needs related to HTN, HLD, DMII, and Memory changes  Family report of memory changes specifically short term memory   RNCM Clinical Goal(s):  Patient will verbalize basic understanding  of HTN, HLD, DMII, and Memory Changes  disease process and self health management plan as evidenced by following the plan of care, taking medications, keeping appointments and calling the office for questions or concerns take all medications exactly as prescribed and will call provider for medication related questions as evidenced by compliance and calling for refills before running out of medications     attend all scheduled medical appointments: attend routine visits with pcp and specialist, as evidenced by keeping appointments and calling for reschedule needs         demonstrate improved and ongoing adherence to prescribed treatment plan for HTN, HLD, DMII, and memory changes as evidenced by working with the pcp and CCM team to optimize the plan of care and effectively manage health and well being demonstrate a decrease in memory changes exacerbations  as evidenced by working with the pcp and CCM team to effectively manage short term memory problems  demonstrate ongoing self health care management ability for effective management of Chronic conditions  as evidenced by working with the CCM team  through collaboration with Consulting civil engineer, provider, and care team.   Interventions: 1:1 collaboration with primary care provider regarding development and update of comprehensive plan of care as evidenced by provider attestation and co-signature Inter-disciplinary care team collaboration (see longitudinal plan of care) Evaluation of current treatment plan related to  self management and patient's adherence to plan as established by provider   Diabetes:  (Status: Goal on Track (progressing): YES.) Long Term Goal   Lab Results  Component Value Date   HGBA1C 5.7 (H) 09/06/2021  Previous A1C 5.9.  Assessed patient's understanding of A1c goal: <7%. 11-08-2021: The patient is below goal. Doing will with effective management of her DM.  Provided education to patient about basic DM disease process. 11-08-2021:  The patient has a good understanding of how to effectively manage her DM. The patient is maintaining her weight and drinking nutrition drinks. Denies any issues with her DM health and well being.  Reviewed medications with patient and discussed importance of medication adherence. 11-08-2021: The patient endorses compliance with medications. Denies any issues with medication management;        Reviewed prescribed diet with patient heart healthy/ADA diet. 07-26-2021: The patient is drinking 2 boost daily to aide in nutrition and have no further weight loss. The patient endorses no further weight loss. 09-06-2021: The patients weight today was 149. She is drinking boost at home. Gave the patient samples of Glucerna with coupons at face to face visit with the patient. Neurosurgeon and calendar provided with [facility] contact information. 11-08-2021: The patient is maintaining her weight. Denies any weight loss. States that she is still drinking the supplements at home. Eating well and sleeping well. ; Counseled on importance of regular laboratory monitoring as prescribed. 07-26-2021: The patient has current lab testing on a routine basis. Last on 06-08-2021. 09-06-2021: New lab work drawn today, pending results.  11-08-2021: The patient has regular lab work. Last in March. Sees pcp again in June.       Discussed plans with patient for ongoing care management follow up  and provided patient with direct contact information for care management team;      Provided patient with written educational materials related to hypo and hyperglycemia and importance of correct treatment. 11-08-2021: The patient denies any episodes of hypo or hyperglycemia. States her readings have been good. States she has not been taking frequently but the last time she did it was good. Denies any light headedness, dizziness, diaphoresis or other issues related to hypo or hyperglycemia. ;       Reviewed scheduled/upcoming provider appointments  including: 12-07-2021 at 940 am;         Advised patient, providing education and rationale, to check cbg when you have symptoms of low or high blood sugar, before and after exercise, and as directed   and record. 07-26-2021: The patient denies any issues with her blood sugars. States she is doing well. 11-08-2021: The patient states she is doing well with her DM management. She did not have any readings available today to review. Education and support given.       call provider for findings outside established parameters;       Review of patient status, including review of consultants reports, relevant laboratory and other test results, and medications completed. 09-06-2021: New lab work drawn today. Pending results. 11-08-2021: The patient is below goal and doing well.       Screening for signs and symptoms of depression related to chronic disease state;        Assessed social determinant of health barriers;        Had eye exam today (07-26-2021). States all was good. No changes in her vision. The doctor actually told her that she did not need glasses. The patient was happy that her eye exam went well. Provided information to the patients daughter Levada Dy that her eye exam was good.  Memory Changes   (Status: Goal on Track (progressing): YES.) Long Term Goal  Evaluation of current treatment plan related to  Memory Changes  , Memory Deficits self-management and patient's adherence to plan as established by provider. 07-26-2021: The patient saw the pcp in December, accompanied by daughter. The patient scored a 28 on her memory test. The provider recommended crossword puzzles and memory games. The patients daughter states that she is satisfied with the results. She states that she and other members of the family can see if more. She states that her long term memory is good but her short term memory is what she sees some deficits in but it is not bad. The patient will often ask her several times about appointments and  other things.  No acute findings today. Will continue to monitor for changes or needs. 11-08-2021: The patient denies any changes in her memory. The patient states she feels she is doing well and denies any acute findings today. Will continue to monitor for changes or new needs. Also spoke to her daughter Levada Dy and she says that she has good days and bad days and she thinks she catches herself when she is repeating herself or asking the same questions. She does not feel like it is any worse than usual. Will continue to monitor for changes or new concerns related to memory loss.  Discussed plans with patient for ongoing care management follow up and provided patient with direct contact information for care management team Advised patient to write down questions to ask the provider at upcoming visit with pcp and to communicate memory changes noted by the family; Provided education to patient re:  cognitive changes to look for, importance of making sure that the patient is taking medications as directed and the importance of safety for the patient; Collaborated with pcp regarding discussion with daughter about concerns of her and her other siblings noticing changes in memory, specifically short term memory.  Advised the patients daughter, Levada Dy to discuss this with the pcp at upcoming appointment on 06-08-2021.  Levada Dy is planning on coming to the appointment with the patient. She wants to do what she can to help the patient maintain independence and get the things she needs to remain stable in her health. 07-26-2021: The patient states that she is doing well. The daughter is pleased with the evaluation of the patients memory. The pcp addressed this in December at her appointment and will evaluate ongoing at subsequent visits.  Provided patient with memory enhancement educational materials related to memory changes and help with resources in memory enhancement; Reviewed scheduled/upcoming provider appointments  including 12-07-2021 at 940 am; Discussed plans with patient for ongoing care management follow up and provided patient with direct contact information for care management team; Advised patient to discuss concerns and changes noted in memory  with provider; Screening for signs and symptoms of depression related to chronic disease state;  Assessed social determinant of health barriers;   Hyperlipidemia:  (Status: Goal on Track (progressing): YES.) Long Term Goal  Lab Results  Component Value Date   CHOL 147 09/06/2021   HDL 72 09/06/2021   LDLCALC 58 09/06/2021   TRIG 90 09/06/2021   CHOLHDL 2.0 09/06/2021   Medication review performed; medication list updated in electronic medical record. 11-08-2021: The patient is compliant with Lipitor 20 mg daily. Denies any medication concerns at this time.  Provider established cholesterol goals reviewed. 07-26-2021: Praised the patient for being at goal with her cholesterol levels. 11-08-2021: The patient remains at goal with cholesterol levels. The patient praised for being at goal and effectively managing HLD health and well being. Counseled on importance of regular laboratory monitoring as prescribed. 07-26-2021: has regular lab work. Last in December of 2022. 11-08-2021: Had lab work done on 09-06-2021 and the patients cholesterol levels are WNL Provided HLD educational materials; Reviewed role and benefits of statin for ASCVD risk reduction; Discussed strategies to manage statin-induced myalgias; Reviewed importance of limiting foods high in cholesterol. 11-08-2021: Review of heart healthy/ADA diet;  Hypertension: (Status: Goal on Track (progressing): YES.) Last practice recorded BP readings:  BP Readings from Last 3 Encounters:  10/31/21 (!) 155/80  09/06/21 120/84  06/30/21 133/81  Most recent eGFR/CrCl:  Lab Results  Component Value Date   EGFR 90 09/06/2021    No components found for: CRCL  Evaluation of current treatment plan related to  hypertension self management and patient's adherence to plan as established by provider. 07-26-2021: The patient is doing well and denies any issues with her blood pressures or HTN health. Was having some headaches and saw pcp x 2 in January for evaluation and treatment options. The pcp felt it was likely sinus related. The patient denies headaches or other issues today. Will continue to monitor for changes;  09-06-2021: The patient is doing well today and denies any acute changes with her HTN or heart health. Will continue to monitor for changes or new concerns. 11-08-2021: The patient was in the office last week to see the pcp due to a cough and blood pressure was elevated. The patient denies any acute findings today and states her cough is better but still there. Denies  any issues with HTN or heart health.  Provided education to patient re: stroke prevention, s/s of heart attack and stroke; Reviewed prescribed diet heart healthy/ADA diet  Reviewed medications with patient and discussed importance of compliance. 11-08-2021: The patient is compliant with medications  Discussed plans with patient for ongoing care management follow up and provided patient with direct contact information for care management team; Advised patient, providing education and rationale, to monitor blood pressure daily and record, calling PCP for findings outside established parameters;  Advised patient to discuss blood pressure changes  with provider; Provided education on prescribed diet heart healthy/ADA diet. 09-06-2021: Written copy of healthy eating provided to the patient today along with a calendar and contact information for the [facility]. The patient also given samples of Glucerna with coupons. Education provided. Ongoing support and education from the CCM team. Will continue to monitor.;  Discussed complications of poorly controlled blood pressure such as heart disease, stroke, circulatory complications, vision complications, kidney  impairment, sexual dysfunction;    cough  (Status: New goal. Goal on Track (progressing): YES.) Short Term Goal  Evaluation of current treatment plan related to  cough ,  self-management and patient's adherence to plan as established by provider. Discussed plans with patient for ongoing care management follow up and provided patient with direct contact information for care management team Advised patient to call the office for fever, worsening cough, chills, questions or concerns. Levada Dy the patients daughter would like for the patient to have a chest x-ray due to the cough not resolving and it has been one week. Will collaborate with the pcp for recommendations. ; Provided education to patient re: monitoring for sx and sx of infection, worsening respiratory concerns, and calling the office for questions and concerns; Reviewed medications with patient and discussed The patient has been taking OTC cough medications but states she is still is coughing. She states that it is some better but still there. Her daughter is concerned that it may turn into pneumonia since she was with her this weekend and could hear "rattling" in her chest. ; Collaborated with pcp  regarding daughters request for the patient to have a chest xray due to the cough not resolving and it has been one week. The daughter is concerned that the patients congestion may be turning into pneumonia. Secure message sent to the pcp asking for recommendations. Will continue to monitor and assist. ; Reviewed scheduled/upcoming provider appointments including 12-07-2021 at [place] am, but the patient may need follow up sooner for ongoing cough x 1 week, saw pcp last week. Will collaborate with the pcp concerning request. ; Discussed plans with patient for ongoing care management follow up and provided patient with direct contact information for care management team; Advised patient to discuss unresolved cough and "chest congestion" with provider;    Patient Goals/Self-Care Activities: Take medications as prescribed   Attend all scheduled provider appointments Call pharmacy for medication refills 3-7 days in advance of running out of medications Attend church or other social activities Perform all self care activities independently  Perform IADL's (shopping, preparing meals, housekeeping, managing finances) independently Call provider office for new concerns or questions  Work with the social worker to address care coordination needs and will continue to work with the clinical team to address health care and disease management related needs call the Suicide and Crisis Lifeline: 988 call the [place] National Suicide Prevention Lifeline: 325-081-0442 or TTY: (425)596-4196 TTY 478-707-1936) to talk to a trained counselor call 1-800-273-TALK (toll free,  24 hour hotline) if experiencing a Mental Health or [place]  schedule appointment with eye doctor. 09-06-2021: Saw the eye doctor on 07-26-2021 and no new changes in her vision  check blood sugar at prescribed times: when you have symptoms of low or high blood sugar, before and after exercise, and as directed by pcp  check feet daily for cuts, sores or redness enter blood sugar readings and medication or insulin into daily log take the blood sugar log to all doctor visits trim toenails straight across drink 6 to 8 glasses of water each day eat fish at least once per week fill half of plate with vegetables limit fast food meals to no more than 1 per week manage portion size prepare main meal at home 3 to 5 days each week read food labels for fat, fiber, carbohydrates and portion size reduce red meat to 2 to 3 times a week keep feet up while sitting wash and dry feet carefully every day wear comfortable, cotton socks wear comfortable, well-fitting shoes check blood pressure weekly choose a place to take my blood pressure (home, clinic or office, retail store) write blood  pressure results in a log or diary learn about high blood pressure keep a blood pressure log take blood pressure log to all doctor appointments call doctor for signs and symptoms of high blood pressure develop an action plan for high blood pressure keep all doctor appointments take medications for blood pressure exactly as prescribed report new symptoms to your doctor eat more whole grains, fruits and vegetables, lean meats and healthy fats - call for medicine refill 2 or 3 days before it runs out - take all medications exactly as prescribed - call doctor with any symptoms you believe are related to your medicine - call doctor when you experience any new symptoms - go to all doctor appointments as scheduled - adhere to prescribed diet: Heart healthy/ADA       Plan:Telephone follow up appointment with care management team member scheduled for:  01-31-2022 at 80 am  Noreene Larsson RN, MSN, [facility] Mobile: [phone]

## 2021-11-08 NOTE — Patient Instructions (Signed)
Visit Information  Thank you for taking time to visit with me today. Please don't hesitate to contact me if I can be of assistance to you before our next scheduled telephone appointment.  Following are the goals we discussed today:  Diabetes:  (Status: Goal on Track (progressing): YES.) Long Term Goal         Lab Results  Component Value Date    HGBA1C 5.7 (H) 09/06/2021  Previous A1C 5.9.  Assessed patient's understanding of A1c goal: <7%. 11-08-2021: The patient is below goal. Doing will with effective management of her DM.  Provided education to patient about basic DM disease process. 11-08-2021: The patient has a good understanding of how to effectively manage her DM. The patient is maintaining her weight and drinking nutrition drinks. Denies any issues with her DM health and well being.  Reviewed medications with patient and discussed importance of medication adherence. 11-08-2021: The patient endorses compliance with medications. Denies any issues with medication management;        Reviewed prescribed diet with patient heart healthy/ADA diet. 07-26-2021: The patient is drinking 2 boost daily to aide in nutrition and have no further weight loss. The patient endorses no further weight loss. 09-06-2021: The patients weight today was 149. She is drinking boost at home. Gave the patient samples of Glucerna with coupons at face to face visit with the patient. Neurosurgeon and calendar provided with [facility] contact information. 11-08-2021: The patient is maintaining her weight. Denies any weight loss. States that she is still drinking the supplements at home. Eating well and sleeping well. ; Counseled on importance of regular laboratory monitoring as prescribed. 07-26-2021: The patient has current lab testing on a routine basis. Last on 06-08-2021. 09-06-2021: New lab work drawn today, pending results.  11-08-2021: The patient has regular lab work. Last in March. Sees pcp again in June.       Discussed  plans with patient for ongoing care management follow up and provided patient with direct contact information for care management team;      Provided patient with written educational materials related to hypo and hyperglycemia and importance of correct treatment. 11-08-2021: The patient denies any episodes of hypo or hyperglycemia. States her readings have been good. States she has not been taking frequently but the last time she did it was good. Denies any light headedness, dizziness, diaphoresis or other issues related to hypo or hyperglycemia. ;       Reviewed scheduled/upcoming provider appointments including: 12-07-2021 at 940 am;         Advised patient, providing education and rationale, to check cbg when you have symptoms of low or high blood sugar, before and after exercise, and as directed   and record. 07-26-2021: The patient denies any issues with her blood sugars. States she is doing well. 11-08-2021: The patient states she is doing well with her DM management. She did not have any readings available today to review. Education and support given.       call provider for findings outside established parameters;       Review of patient status, including review of consultants reports, relevant laboratory and other test results, and medications completed. 09-06-2021: New lab work drawn today. Pending results. 11-08-2021: The patient is below goal and doing well.       Screening for signs and symptoms of depression related to chronic disease state;        Assessed social determinant of health barriers;  Had eye exam today (07-26-2021). States all was good. No changes in her vision. The doctor actually told her that she did not need glasses. The patient was happy that her eye exam went well. Provided information to the patients daughter Levada Dy that her eye exam was good.   Memory Changes   (Status: Goal on Track (progressing): YES.) Long Term Goal  Evaluation of current treatment plan related to   Memory Changes  , Memory Deficits self-management and patient's adherence to plan as established by provider. 07-26-2021: The patient saw the pcp in December, accompanied by daughter. The patient scored a 28 on her memory test. The provider recommended crossword puzzles and memory games. The patients daughter states that she is satisfied with the results. She states that she and other members of the family can see if more. She states that her long term memory is good but her short term memory is what she sees some deficits in but it is not bad. The patient will often ask her several times about appointments and other things.  No acute findings today. Will continue to monitor for changes or needs. 11-08-2021: The patient denies any changes in her memory. The patient states she feels she is doing well and denies any acute findings today. Will continue to monitor for changes or new needs. Also spoke to her daughter Levada Dy and she says that she has good days and bad days and she thinks she catches herself when she is repeating herself or asking the same questions. She does not feel like it is any worse than usual. Will continue to monitor for changes or new concerns related to memory loss.  Discussed plans with patient for ongoing care management follow up and provided patient with direct contact information for care management team Advised patient to write down questions to ask the provider at upcoming visit with pcp and to communicate memory changes noted by the family; Provided education to patient re: cognitive changes to look for, importance of making sure that the patient is taking medications as directed and the importance of safety for the patient; Collaborated with pcp regarding discussion with daughter about concerns of her and her other siblings noticing changes in memory, specifically short term memory.  Advised the patients daughter, Levada Dy to discuss this with the pcp at upcoming appointment on  06-08-2021.  Levada Dy is planning on coming to the appointment with the patient. She wants to do what she can to help the patient maintain independence and get the things she needs to remain stable in her health. 07-26-2021: The patient states that she is doing well. The daughter is pleased with the evaluation of the patients memory. The pcp addressed this in December at her appointment and will evaluate ongoing at subsequent visits.  Provided patient with memory enhancement educational materials related to memory changes and help with resources in memory enhancement; Reviewed scheduled/upcoming provider appointments including 12-07-2021 at 940 am; Discussed plans with patient for ongoing care management follow up and provided patient with direct contact information for care management team; Advised patient to discuss concerns and changes noted in memory  with provider; Screening for signs and symptoms of depression related to chronic disease state;  Assessed social determinant of health barriers;    Hyperlipidemia:  (Status: Goal on Track (progressing): YES.) Long Term Goal       Lab Results  Component Value Date    CHOL 147 09/06/2021    HDL 72 09/06/2021    LDLCALC 58 09/06/2021  TRIG 90 09/06/2021    CHOLHDL 2.0 09/06/2021    Medication review performed; medication list updated in electronic medical record. 11-08-2021: The patient is compliant with Lipitor 20 mg daily. Denies any medication concerns at this time.  Provider established cholesterol goals reviewed. 07-26-2021: Praised the patient for being at goal with her cholesterol levels. 11-08-2021: The patient remains at goal with cholesterol levels. The patient praised for being at goal and effectively managing HLD health and well being. Counseled on importance of regular laboratory monitoring as prescribed. 07-26-2021: has regular lab work. Last in December of 2022. 11-08-2021: Had lab work done on 09-06-2021 and the patients cholesterol levels are  WNL Provided HLD educational materials; Reviewed role and benefits of statin for ASCVD risk reduction; Discussed strategies to manage statin-induced myalgias; Reviewed importance of limiting foods high in cholesterol. 11-08-2021: Review of heart healthy/ADA diet;   Hypertension: (Status: Goal on Track (progressing): YES.) Last practice recorded BP readings:     BP Readings from Last 3 Encounters:  10/31/21 (!) 155/80  09/06/21 120/84  06/30/21 133/81  Most recent eGFR/CrCl:       Lab Results  Component Value Date    EGFR 90 09/06/2021    No components found for: CRCL   Evaluation of current treatment plan related to hypertension self management and patient's adherence to plan as established by provider. 07-26-2021: The patient is doing well and denies any issues with her blood pressures or HTN health. Was having some headaches and saw pcp x 2 in January for evaluation and treatment options. The pcp felt it was likely sinus related. The patient denies headaches or other issues today. Will continue to monitor for changes;  09-06-2021: The patient is doing well today and denies any acute changes with her HTN or heart health. Will continue to monitor for changes or new concerns. 11-08-2021: The patient was in the office last week to see the pcp due to a cough and blood pressure was elevated. The patient denies any acute findings today and states her cough is better but still there. Denies any issues with HTN or heart health.  Provided education to patient re: stroke prevention, s/s of heart attack and stroke; Reviewed prescribed diet heart healthy/ADA diet  Reviewed medications with patient and discussed importance of compliance. 11-08-2021: The patient is compliant with medications  Discussed plans with patient for ongoing care management follow up and provided patient with direct contact information for care management team; Advised patient, providing education and rationale, to monitor blood  pressure daily and record, calling PCP for findings outside established parameters;  Advised patient to discuss blood pressure changes  with provider; Provided education on prescribed diet heart healthy/ADA diet. 09-06-2021: Written copy of healthy eating provided to the patient today along with a calendar and contact information for the [facility]. The patient also given samples of Glucerna with coupons. Education provided. Ongoing support and education from the CCM team. Will continue to monitor.;  Discussed complications of poorly controlled blood pressure such as heart disease, stroke, circulatory complications, vision complications, kidney impairment, sexual dysfunction;      cough  (Status: New goal. Goal on Track (progressing): YES.) Short Term Goal  Evaluation of current treatment plan related to  cough ,  self-management and patient's adherence to plan as established by provider. Discussed plans with patient for ongoing care management follow up and provided patient with direct contact information for care management team Advised patient to call the office for fever, worsening cough, chills, questions  or concerns. Levada Dy the patients daughter would like for the patient to have a chest x-ray due to the cough not resolving and it has been one week. Will collaborate with the pcp for recommendations. ; Provided education to patient re: monitoring for sx and sx of infection, worsening respiratory concerns, and calling the office for questions and concerns; Reviewed medications with patient and discussed The patient has been taking OTC cough medications but states she is still is coughing. She states that it is some better but still there. Her daughter is concerned that it may turn into pneumonia since she was with her this weekend and could hear "rattling" in her chest. ; Collaborated with pcp  regarding daughters request for the patient to have a chest xray due to the cough not resolving and it has been  one week. The daughter is concerned that the patients congestion may be turning into pneumonia. Secure message sent to the pcp asking for recommendations. Will continue to monitor and assist. ; Reviewed scheduled/upcoming provider appointments including 12-07-2021 at [place] am, but the patient may need follow up sooner for ongoing cough x 1 week, saw pcp last week. Will collaborate with the pcp concerning request. ; Discussed plans with patient for ongoing care management follow up and provided patient with direct contact information for care management team; Advised patient to discuss unresolved cough and "chest congestion" with provider  Our next appointment is by telephone on 01-31-2022 at 1030 am  Please call the care guide team at [phone] if you need to cancel or reschedule your appointment.   If you are experiencing a Mental Health or [place] or need someone to talk to, please call the Suicide and Crisis Lifeline: 988 call the [place] National Suicide Prevention Lifeline: 250-298-2883 or TTY: 8028621372 TTY 904-667-5563) to talk to a trained counselor call 1-800-273-TALK (toll free, 24 hour hotline)   Patient verbalizes understanding of instructions and care plan provided today and agrees to view in [place]. Active MyChart status and patient understanding of how to access instructions and care plan via MyChart confirmed with patient.     Noreene Larsson RN, MSN, [facility] Mobile: [phone]

## 2021-11-16 DIAGNOSIS — E785 Hyperlipidemia, unspecified: Secondary | ICD-10-CM

## 2021-11-16 DIAGNOSIS — E1169 Type 2 diabetes mellitus with other specified complication: Secondary | ICD-10-CM

## 2021-11-16 DIAGNOSIS — I10 Essential (primary) hypertension: Secondary | ICD-10-CM

## 2021-11-16 DIAGNOSIS — Z7984 Long term (current) use of oral hypoglycemic drugs: Secondary | ICD-10-CM

## 2021-12-07 ENCOUNTER — Ambulatory Visit (INDEPENDENT_AMBULATORY_CARE_PROVIDER_SITE_OTHER): Payer: Medicare Other | Admitting: Physician Assistant

## 2021-12-07 ENCOUNTER — Ambulatory Visit: Payer: Medicare Other | Admitting: Nurse Practitioner

## 2021-12-07 ENCOUNTER — Encounter: Payer: Self-pay | Admitting: Physician Assistant

## 2021-12-07 VITALS — BP 163/86 | HR 82 | Temp 97.6°F | Wt 150.6 lb

## 2021-12-07 DIAGNOSIS — E1129 Type 2 diabetes mellitus with other diabetic kidney complication: Secondary | ICD-10-CM

## 2021-12-07 DIAGNOSIS — E1159 Type 2 diabetes mellitus with other circulatory complications: Secondary | ICD-10-CM | POA: Diagnosis not present

## 2021-12-07 DIAGNOSIS — E785 Hyperlipidemia, unspecified: Secondary | ICD-10-CM

## 2021-12-07 DIAGNOSIS — I152 Hypertension secondary to endocrine disorders: Secondary | ICD-10-CM | POA: Diagnosis not present

## 2021-12-07 DIAGNOSIS — R809 Proteinuria, unspecified: Secondary | ICD-10-CM | POA: Diagnosis not present

## 2021-12-07 DIAGNOSIS — R200 Anesthesia of skin: Secondary | ICD-10-CM | POA: Diagnosis not present

## 2021-12-07 DIAGNOSIS — E1169 Type 2 diabetes mellitus with other specified complication: Secondary | ICD-10-CM

## 2021-12-07 NOTE — Assessment & Plan Note (Signed)
Chronic, historic condition Elevated BP in office today. Pt reports elevations at home but is not taking routinely (140s/80s) Currently taking Amlodipine 10 mg PO QD and Lisinopril 20 mg PO QD with excellent compliance She reports recent family stressors that may be contributory Recommend she take BP daily at home and keep record to review with PCP in 4 weeks. Unsure if these elevations are transient vs exacerbation Follow up in 4 weeks to discuss home readings

## 2021-12-07 NOTE — Assessment & Plan Note (Signed)
Chronic, historic condition Previous A1c 5.7  Currently managed with Metformin 500 mg PO BID - no concerns for side effects today Will recheck A1c, CMP, lipids and microalbumin - results to dictate further management Continue current regimen at this time. Recommend follow up in 3 months for monitoring

## 2021-12-07 NOTE — Assessment & Plan Note (Signed)
Chronic, historic condition Currently taking Atorvastatin 20 mg PO QD - denies concerns for side effects today Reviewed increasing physical activity and dietary changes - patient education materials provided Continue current medications  Recheck lipid panel - results to dictate further management  Follow up in 3 months for monitoring

## 2021-12-07 NOTE — Patient Instructions (Addendum)
  I would like to get some labwork drawn but this needs to be done fasting Please make a lab apt to have these completed. Please do not eat or drink anything for at least 8 hours before your lab apt (it is okay to drink water and take your medications as usual)  We will keep you updated on your lab results as they are available  Your blood pressure was mildly elevated today.  If possible please take it at home using an electronic blood pressure cuff for the upper arm Record your blood pressure once per day and bring them back with you to your apt so we can make sure you are not developing high blood pressure.   Incorporating a minimum of 150 minutes (20-30 minutes per day) of moderate intensity physical activity can help improve your heart health and reduce the chances of high blood pressure and other cardiovascular risks. Incorporating a heart healthy diet can also help reduce the chances of heart attack and high cholesterol.

## 2021-12-07 NOTE — Progress Notes (Signed)
Established Patient Office Visit  Name: Ellen [name]   MRN: [id number]    DOB: [birth year]   Date:12/07/2021  Today's Provider: Talitha Givens, MHS, PA-C Introduced myself to the patient as a PA-C and provided education on APPs in clinical practice.         Subjective  Chief Complaint  Chief Complaint  Patient presents with   Hypertension    State feeling overall well per patient.    Hyperlipidemia   Diabetes    HPI  HYPERTENSION / HYPERLIPIDEMIA Satisfied with current treatment? yes Duration of hypertension: years BP monitoring frequency: weekly BP range: 140s/80s  BP medication side effects: no Past BP meds: amlodipine and lisinopril Duration of hyperlipidemia: years Cholesterol medication side effects: no Cholesterol supplements: none Past cholesterol medications: atorvastain (lipitor) Medication compliance: excellent compliance Aspirin: yes Recent stressors: yes- news about her brother yesterday upset her. Sounds as though he is transitioning to hospice for illness.  Recurrent headaches:  Headaches today but not frequently  Visual changes: no Palpitations: no Dyspnea: no Chest pain: no Lower extremity edema: no Dizzy/lightheaded: no   Diabetes, Type 2 - Last A1c 5.7 - Medications: Metformin 500 mg PO BID  - Compliance: Excellent - Checking BG at home: Not checking at home.  - Diet: Normal diet. States she eats fruit a lot- mostly canned fruit and oranges.  - Exercise: She is not regularly exercising. Sporadically walking in park with husband   - Eye exam: Due in Feb 2024 - Foot exam: Due in Oct 2023 - Microalbumin: Ordered today.  - Statin: Currently on atorvastatin - PNA vaccine: Completed. - Denies symptoms of hypoglycemia, polyuria, polydipsia,  Reports some intermittent numbness in her fingers. Denies numbness, tingling or weakness in feet.  Denies injuries or non-healing wounds to feet.   Patient Active Problem List   Diagnosis Date  Noted   Weight loss 03/09/2021   Obesity 05/31/2020   Cervicalgia 12/03/2019   Hyperlipidemia associated with type 2 diabetes mellitus ([place]) 11/08/2018   Atherosclerosis of aorta ([place]) 11/13/2017   Back pain 06/13/2017   Muscle soreness 05/16/2017   Advanced care planning/counseling discussion 04/17/2017   Hypertension associated with diabetes ([place]) 10/04/2015   Menopause 03/29/2015   Type 2 diabetes mellitus with proteinuria ([place]) 03/29/2015    Past Surgical History:  Procedure Laterality Date   CHOLECYSTECTOMY  1982   TUBAL LIGATION  1984    Family History  Problem Relation Age of Onset   Alzheimer's disease Mother    Diabetes Mother    Stroke Mother    Hyperlipidemia Mother    Stroke Father    Hypertension Father    Hyperlipidemia Father    Diabetes Sister    Diabetes Brother    Hypertension Brother    Diabetes Sister     Social History   Tobacco Use   Smoking status: Never   Smokeless tobacco: Never  Substance Use Topics   Alcohol use: No    Alcohol/week: 0.0 standard drinks of alcohol     Current Outpatient Medications:    amLODipine (NORVASC) 10 MG tablet, Take 1 tablet (10 mg total) by mouth daily., Disp: 90 tablet, Rfl: 1   aspirin 81 MG tablet, Take 81 mg by mouth daily., Disp: , Rfl:    atorvastatin (LIPITOR) 20 MG tablet, Take 1 tablet (20 mg total) by mouth daily., Disp: 90 tablet, Rfl: 4   blood glucose meter kit and supplies KIT, Dispense based on patient  and insurance preference. Use up to four times daily as directed. (FOR ICD-9 250.00, 250.01)., Disp: 1 each, Rfl: 0   CALCIUM-MAGNESIUM-ZINC PO, Take 1 tablet by mouth daily., Disp: , Rfl:    calcium-vitamin D 250-100 MG-UNIT tablet, Take 1 tablet by mouth 2 (two) times daily., Disp: , Rfl:    Cholecalciferol (VITAMIN D3) 1000 units CAPS, Take 2 capsules by mouth daily. , Disp: , Rfl:    lisinopril (ZESTRIL) 20 MG tablet, Take 1 tablet (20 mg total) by mouth daily., Disp: 90 tablet, Rfl: 1    Magnesium 500 MG TABS, Take 1 tablet by mouth daily., Disp: , Rfl:    metFORMIN (GLUCOPHAGE) 500 MG tablet, Start out taking 1 tablet (500 MG) by mouth every morning with meal, then on week two start taking 1 tablet (500 MG) by mouth in morning with meal and 1 tablet (500 MG) in evening with meal., Disp: 180 tablet, Rfl: 3   vitamin C (ASCORBIC ACID) 500 MG tablet, Take 500 mg by mouth daily., Disp: , Rfl:   No Known Allergies  I personally reviewed active problem list, medication list, allergies, health maintenance, notes from last encounter, lab results with the patient/caregiver today.   Review of Systems  Constitutional:  Negative for chills, fever, malaise/fatigue and weight loss.  Eyes:  Negative for blurred vision and double vision.  Respiratory:  Negative for cough and shortness of breath.   Cardiovascular:  Negative for chest pain, palpitations and leg swelling.  Gastrointestinal:  Negative for abdominal pain, blood in stool, diarrhea, nausea and vomiting.  Genitourinary:  Negative for frequency.  Neurological:  Positive for tingling and headaches. Negative for weakness.  Endo/Heme/Allergies:  Negative for polydipsia.  Psychiatric/Behavioral:  Positive for depression.       Objective  Vitals:   12/07/21 0940 12/07/21 0944  BP: (!) 156/84 (!) 163/86  Pulse: 83 82  Temp: 97.6 F (36.4 C)   TempSrc: Oral   SpO2: 98%   Weight: 150 lb 9.6 oz (68.3 kg)     Body mass index is 28.54 kg/m.  Physical Exam Vitals reviewed.  Constitutional:      General: She is awake.     Appearance: Normal appearance. She is well-developed, well-groomed and overweight.  HENT:     Head: Normocephalic and atraumatic.  Cardiovascular:     Rate and Rhythm: Normal rate and regular rhythm.     Pulses: Normal pulses.     Heart sounds: Murmur heard.     Systolic murmur is present with a grade of 1/6.     No friction rub. No gallop.  Pulmonary:     Effort: Pulmonary effort is normal.      Breath sounds: Normal breath sounds and air entry. No decreased breath sounds, wheezing, rhonchi or rales.  Musculoskeletal:     Cervical back: Normal range of motion and neck supple.     Right lower leg: No edema.     Left lower leg: No edema.  Neurological:     Mental Status: She is alert.  Psychiatric:        Attention and Perception: Attention normal.        Mood and Affect: Mood and affect normal.        Speech: Speech normal.        Behavior: Behavior normal. Behavior is cooperative.      No results found for this or any previous visit (from the past 2160 hour(s)).   PHQ2/9:    12/07/2021  9:43 AM 06/30/2021    9:45 AM 06/08/2021   10:04 AM 04/28/2021   10:00 AM 01/19/2021    2:23 PM  Depression screen PHQ 2/9  Decreased Interest 0 0 0 0 0  Down, Depressed, Hopeless 0 0 0 0 0  PHQ - 2 Score 0 0 0 0 0  Altered sleeping 0 0 0    Tired, decreased energy 0 0 0    Change in appetite 0 0 0    Feeling bad or failure about yourself  0 0 0    Trouble concentrating 0 0 0    Moving slowly or fidgety/restless 0 0 0    Suicidal thoughts 0 0 0    PHQ-9 Score 0 0 0    Difficult doing work/chores Not difficult at all Not difficult at all Not difficult at all        Fall Risk:    12/07/2021    9:43 AM 06/30/2021    9:45 AM 04/28/2021    9:51 AM 02/23/2021    8:11 AM 02/15/2021    1:16 PM  Fall Risk   Falls in the past year? 0 0 0 0 0  Number falls in past yr: 0 0 0 0 0  Injury with Fall? 0 0 0 0   Risk for fall due to : No Fall Risks No Fall Risks  No Fall Risks No Fall Risks  Follow up Falls evaluation completed Falls evaluation completed Falls evaluation completed;Falls prevention discussed  Falls evaluation completed      Functional Status Survey:      Assessment & Plan  Problem List Items Addressed This Visit       Cardiovascular and Mediastinum   Hypertension associated with diabetes ([place])    Chronic, historic condition Elevated BP in office today. Pt  reports elevations at home but is not taking routinely (140s/80s) Currently taking Amlodipine 10 mg PO QD and Lisinopril 20 mg PO QD with excellent compliance She reports recent family stressors that may be contributory Recommend she take BP daily at home and keep record to review with PCP in 4 weeks. Unsure if these elevations are transient vs exacerbation Follow up in 4 weeks to discuss home readings       Relevant Orders   CBC w/Diff     Endocrine   Type 2 diabetes mellitus with proteinuria ([facility]) - Primary    Chronic, historic condition Previous A1c 5.7  Currently managed with Metformin 500 mg PO BID - no concerns for side effects today Will recheck A1c, CMP, lipids and microalbumin - results to dictate further management Continue current regimen at this time. Recommend follow up in 3 months for monitoring       Relevant Orders   HgB A1c   Lipid Profile   Comp Met (CMET)   Urine Microalbumin w/creat. ratio   B12   Hyperlipidemia associated with type 2 diabetes mellitus (H[facility])    Chronic, historic condition Currently taking Atorvastatin 20 mg PO QD - denies concerns for side effects today Reviewed increasing physical activity and dietary changes - patient education materials provided Continue current medications  Recheck lipid panel - results to dictate further management  Follow up in 3 months for monitoring       Relevant Orders   Lipid Profile   Other Visit Diagnoses     Numbness in both hands     Acute, new problem Will check B12 due to metformin use.  Follow up as needed for persistent  or progressing symptoms.     Relevant Orders   B12        Return in about 4 weeks (around 01/04/2022) for elevated BP/ HTN.   I, Cuauhtemoc Huegel E Kayleb Warshaw, PA-C, have reviewed all documentation for this visit. The documentation on 12/07/21 for the exam, diagnosis, procedures, and orders are all accurate and complete.   Talitha Givens, MHS, PA-C [facility]

## 2021-12-08 ENCOUNTER — Other Ambulatory Visit: Payer: Medicare Other

## 2021-12-08 DIAGNOSIS — E785 Hyperlipidemia, unspecified: Secondary | ICD-10-CM | POA: Diagnosis not present

## 2021-12-08 DIAGNOSIS — E1169 Type 2 diabetes mellitus with other specified complication: Secondary | ICD-10-CM | POA: Diagnosis not present

## 2021-12-08 DIAGNOSIS — E1159 Type 2 diabetes mellitus with other circulatory complications: Secondary | ICD-10-CM | POA: Diagnosis not present

## 2021-12-08 DIAGNOSIS — R809 Proteinuria, unspecified: Secondary | ICD-10-CM | POA: Diagnosis not present

## 2021-12-08 DIAGNOSIS — E1129 Type 2 diabetes mellitus with other diabetic kidney complication: Secondary | ICD-10-CM | POA: Diagnosis not present

## 2021-12-09 LAB — LIPID PANEL
Chol/HDL Ratio: 2.2 ratio (ref 0.0–4.4)
Cholesterol, Total: 140 mg/dL (ref 100–199)
HDL: 65 mg/dL (ref >39)
LDL Chol Calc (NIH): 57 mg/dL (ref 0–99)
Triglycerides: 95 mg/dL (ref 0–149)
VLDL Cholesterol Cal: 18 mg/dL (ref 5–40)

## 2021-12-09 LAB — CBC WITH DIFFERENTIAL/PLATELET
Basophils Absolute: 0.1 10*3/uL (ref 0.0–0.2)
Basos: 1 %
EOS (ABSOLUTE): 0.2 10*3/uL (ref 0.0–0.4)
Eos: 4 %
Hematocrit: 37.2 % (ref 34.0–46.6)
Hemoglobin: 11.7 g/dL (ref 11.1–15.9)
Immature Grans (Abs): 0 10*3/uL (ref 0.0–0.1)
Immature Granulocytes: 0 %
Lymphocytes Absolute: 1.4 10*3/uL (ref 0.7–3.1)
Lymphs: 27 %
MCH: 27.7 pg (ref 26.6–33.0)
MCHC: 31.5 g/dL (ref 31.5–35.7)
MCV: 88 fL (ref 79–97)
Monocytes Absolute: 0.4 10*3/uL (ref 0.1–0.9)
Monocytes: 8 %
Neutrophils Absolute: 3.1 10*3/uL (ref 1.4–7.0)
Neutrophils: 60 %
Platelets: 267 10*3/uL (ref 150–450)
RBC: 4.22 x10E6/uL (ref 3.77–5.28)
RDW: 13.9 % (ref 11.7–15.4)
WBC: 5.2 10*3/uL (ref 3.4–10.8)

## 2021-12-09 LAB — HEMOGLOBIN A1C
Est. average glucose Bld gHb Est-mCnc: 126 mg/dL
Hgb A1c MFr Bld: 6 % — ABNORMAL HIGH (ref 4.8–5.6)

## 2021-12-09 LAB — COMPREHENSIVE METABOLIC PANEL
ALT: 11 IU/L (ref 0–32)
AST: 20 IU/L (ref 0–40)
Albumin/Globulin Ratio: 1.7 (ref 1.2–2.2)
Albumin: 4.2 g/dL (ref 3.7–4.7)
Alkaline Phosphatase: 111 IU/L (ref 44–121)
BUN/Creatinine Ratio: 23 (ref 12–28)
BUN: 14 mg/dL (ref 8–27)
Bilirubin Total: 0.2 mg/dL (ref 0.0–1.2)
CO2: 24 mmol/L (ref 20–29)
Calcium: 9.6 mg/dL (ref 8.7–10.3)
Chloride: 102 mmol/L (ref 96–106)
Creatinine, Ser: 0.62 mg/dL (ref 0.57–1.00)
Globulin, Total: 2.5 g/dL (ref 1.5–4.5)
Glucose: 98 mg/dL (ref 70–99)
Potassium: 3.5 mmol/L (ref 3.5–5.2)
Sodium: 141 mmol/L (ref 134–144)
Total Protein: 6.7 g/dL (ref 6.0–8.5)
eGFR: 91 mL/min/{1.73_m2} (ref >59)

## 2021-12-09 LAB — MICROALBUMIN / CREATININE URINE RATIO
Creatinine, Urine: 33.5 mg/dL
Microalb/Creat Ratio: 39 mg/g creat — ABNORMAL HIGH (ref 0–29)
Microalbumin, Urine: 13 ug/mL

## 2021-12-09 LAB — VITAMIN B12: Vitamin B-12: >2000 pg/mL — ABNORMAL HIGH (ref 232–1245)

## 2021-12-14 ENCOUNTER — Telehealth: Payer: Self-pay | Admitting: Nurse Practitioner

## 2021-12-14 NOTE — Telephone Encounter (Signed)
I can see patient at 1120 on July 5th

## 2021-12-14 NOTE — Telephone Encounter (Signed)
Patient scheduled.

## 2021-12-14 NOTE — Telephone Encounter (Signed)
Copied from CRM [id number]. Topic: Appointment Scheduling - Scheduling Inquiry for Clinic >> Dec 14, 2021  2:24 PM Marlow Baars wrote: Reason for CRM: The patients daughter, Rosalene Billings called in requesting an earlier appointment but there is not one available. She specifically requested either July 5th or July 12th because that is when she can be in town. She states her mother has been dealing with incontinence for awhile but that the provider probably doesn't know and she should be informed. The daughter does have a DPR on file >> Dec 14, 2021  2:30 PM Marlow Baars wrote: Patient is currently scheduled for an appointment on July 19 with the provider

## 2021-12-15 ENCOUNTER — Ambulatory Visit: Payer: Self-pay

## 2021-12-15 NOTE — Telephone Encounter (Signed)
  Chief Complaint: HTN Symptoms: BP readings see #1.  Frequency: today Pertinent Negatives: Patient denies dizziness, HA, chest pain, blurred vision Disposition: [] ED /[] Urgent Care (no appt availability in office) / [x] Appointment(In office/virtual)/ []  [facility]/ [] Home Care/ [] Refused Recommended Disposition /[] [facility]/ []  Follow-up with PCP Additional Notes: pt has already taken her BP meds this morning and pt elevated. Daughter concerned about several other things as well. Pt has been incontinent for several months now and pt is in denial per daughter. She is also taking laxatives often if she doesn't have a BM after 1 day and then has accidents with BM. And daughter also wants to address diet since pt is eating spicy food which affects her stomach since she doesn't have gallbladder. Advised daughter to move up appt from 12/21/21 to 12/19/21 at 1020. And gave red numbers for when to go to ED for BP.   Reason for Disposition  Systolic BP  >= 160 OR Diastolic >= 100  Answer Assessment - Initial Assessment Questions 1. BLOOD PRESSURE: "What is the blood pressure?" "Did you take at least two measurements 5 minutes apart?"     176/94, HR 90, 170/94, HR 83 rested 169/91, HR 87, 162/96, HR 88 2. ONSET: "When did you take your blood pressure?"     Today  3. HOW: "How did you obtain the blood pressure?" (e.g., visiting nurse, automatic home BP monitor)      4. HISTORY: "Do you have a history of high blood pressure?"     yes 5. MEDICATIONS: "Are you taking any medications for blood pressure?" "Have you missed any doses recently?"     yes 6. OTHER SYMPTOMS: "Do you have any symptoms?" (e.g., headache, chest pain, blurred vision, difficulty breathing, weakness)  Protocols used: Blood Pressure - High-A-AH

## 2021-12-16 NOTE — Progress Notes (Unsigned)
LMP 11/28/1994 (Approximate)    Subjective:    Patient ID: [name], female    DOB: 09-May-1941, 81 y.o.   MRN: [id number]  HPI: TIMOTHEA BODENHEIMER is a 81 y.o. female  No chief complaint on file.  HYPERTENSION Hypertension status: {Blank single:19197::"controlled","uncontrolled","better","worse","exacerbated","stable"}  Satisfied with current treatment? {Blank single:19197::"yes","no"} Duration of hypertension: {Blank single:19197::"chronic","months","years"} BP monitoring frequency:  {Blank single:19197::"not checking","rarely","daily","weekly","monthly","a few times a day","a few times a week","a few times a month"} BP range:  BP medication side effects:  {Blank single:19197::"yes","no"} Medication compliance: {Blank single:19197::"excellent compliance","good compliance","fair compliance","poor compliance"} Previous BP meds:{Blank GBEEFEOF:12197::"JOIT","GPQDIYMEBR","AXENMMHWKG/SUPJSRPRXY","VOPFYTWK","MQKMMNOTRR","NHAFBXUXYB/FXOV","ANVBTYOMAY (bystolic)","carvedilol","chlorthalidone","clonidine","diltiazem","exforge HCT","HCTZ","irbesartan (avapro)","labetalol","lisinopril","lisinopril-HCTZ","losartan (cozaar)","methyldopa","nifedipine","olmesartan (benicar)","olmesartan-HCTZ","quinapril","ramipril","spironalactone","tekturna","valsartan","valsartan-HCTZ","verapamil"} Aspirin: {Blank single:19197::"yes","no"} Recurrent headaches: {Blank single:19197::"yes","no"} Visual changes: {Blank single:19197::"yes","no"} Palpitations: {Blank single:19197::"yes","no"} Dyspnea: {Blank single:19197::"yes","no"} Chest pain: {Blank single:19197::"yes","no"} Lower extremity edema: {Blank single:19197::"yes","no"} Dizzy/lightheaded: {Blank single:19197::"yes","no"}  URINARY SYMPTOMS  Dysuria: {Blank single:19197::"yes","no","burning"} Urinary frequency: {Blank single:19197::"yes","no"} Urgency: {Blank single:19197::"yes","no"} Small volume voids: {Blank single:19197::"yes","no"} Symptom  severity: {Blank single:19197::"yes","no"} Urinary incontinence: {Blank single:19197::"yes","no"} Foul odor: {Blank single:19197::"yes","no"} Hematuria: {Blank single:19197::"yes","no"} Abdominal pain: {Blank single:19197::"yes","no"} Back pain: {Blank single:19197::"yes","no"} Suprapubic pain/pressure: {Blank single:19197::"yes","no"} Flank pain: {Blank single:19197::"yes","no"} Fever:  {Blank multiple:19196::"yes","no","subjective","low grade"} Vomiting: {Blank single:19197::"yes","no"} Relief with cranberry juice: {Blank single:19197::"yes","no"} Relief with pyridium: {Blank single:19197::"yes","no"} Status: better/worse/stable Previous urinary tract infection: {Blank single:19197::"yes","no"} Recurrent urinary tract infection: {Blank single:19197::"yes","no"} Sexual activity: No sexually active/monogomous/practicing safe sex History of sexually transmitted disease: {Blank single:19197::"yes","no"} Penile discharge: {Blank single:19197::"yes","no"} Treatments attempted: {Blank multiple:19196::"none","antibiotics","pyridium","cranberry","increasing fluids"}   Relevant past medical, surgical, family and social history reviewed and updated as indicated. Interim medical history since our last visit reviewed. Allergies and medications reviewed and updated.  Review of Systems  Per HPI unless specifically indicated above     Objective:    LMP 11/28/1994 (Approximate)   Wt Readings from Last 3 Encounters:  12/07/21 150 lb 9.6 oz (68.3 kg)  10/31/21 147 lb 3.2 oz (66.8 kg)  09/06/21 149 lb 9.6 oz (67.9 kg)    Physical Exam  Results for orders placed or performed in visit on 12/07/21  HgB A1c  Result Value Ref Range   Hgb A1c MFr Bld 6.0 (H) 4.8 - 5.6 %   Est. average glucose Bld gHb Est-mCnc 126 mg/dL  Lipid Profile  Result Value Ref Range   Cholesterol, Total 140 100 - 199 mg/dL   Triglycerides 95 0 - 149 mg/dL   HDL 65 >39 mg/dL   VLDL Cholesterol Cal 18 5 - 40 mg/dL    LDL Chol Calc (NIH) 57 0 - 99 mg/dL   Chol/HDL Ratio 2.2 0.0 - 4.4 ratio  Comp Met (CMET)  Result Value Ref Range   Glucose 98 70 - 99 mg/dL   BUN 14 8 - 27 mg/dL   Creatinine, Ser 0.62 0.57 - 1.00 mg/dL   eGFR 91 >59 mL/min/1.73   BUN/Creatinine Ratio 23 12 - 28   Sodium 141 134 - 144 mmol/L   Potassium 3.5 3.5 - 5.2 mmol/L   Chloride 102 96 - 106 mmol/L   CO2 24 20 - 29 mmol/L   Calcium 9.6 8.7 - 10.3 mg/dL   Total Protein 6.7 6.0 - 8.5 g/dL   Albumin 4.2 3.7 - 4.7 g/dL   Globulin, Total 2.5 1.5 - 4.5 g/dL   Albumin/Globulin Ratio 1.7 1.2 - 2.2   Bilirubin Total 0.2 0.0 - 1.2 mg/dL   Alkaline Phosphatase 111 44 - 121 IU/L   AST 20 0 - 40 IU/L   ALT 11 0 - 32 IU/L  CBC w/Diff  Result Value Ref Range  WBC 5.2 3.4 - 10.8 x10E3/uL   RBC 4.22 3.77 - 5.28 x10E6/uL   Hemoglobin 11.7 11.1 - 15.9 g/dL   Hematocrit 37.2 34.0 - 46.6 %   MCV 88 79 - 97 fL   MCH 27.7 26.6 - 33.0 pg   MCHC 31.5 31.5 - 35.7 g/dL   RDW 13.9 11.7 - 15.4 %   Platelets 267 150 - 450 x10E3/uL   Neutrophils 60 Not Estab. %   Lymphs 27 Not Estab. %   Monocytes 8 Not Estab. %   Eos 4 Not Estab. %   Basos 1 Not Estab. %   Neutrophils Absolute 3.1 1.4 - 7.0 x10E3/uL   Lymphocytes Absolute 1.4 0.7 - 3.1 x10E3/uL   Monocytes Absolute 0.4 0.1 - 0.9 x10E3/uL   EOS (ABSOLUTE) 0.2 0.0 - 0.4 x10E3/uL   Basophils Absolute 0.1 0.0 - 0.2 x10E3/uL   Immature Granulocytes 0 Not Estab. %   Immature Grans (Abs) 0.0 0.0 - 0.1 x10E3/uL  Urine Microalbumin w/creat. ratio  Result Value Ref Range   Creatinine, Urine 33.5 Not Estab. mg/dL   Microalbumin, Urine 13.0 Not Estab. ug/mL   Microalb/Creat Ratio 39 (H) 0 - 29 mg/g creat  B12  Result Value Ref Range   Vitamin B-12 >2000 (H) 232 - 1245 pg/mL      Assessment & Plan:   Problem List Items Addressed This Visit       Cardiovascular and Mediastinum   Hypertension associated with diabetes ([place]) - Primary     Follow up plan: No follow-ups on file.

## 2021-12-19 ENCOUNTER — Ambulatory Visit (INDEPENDENT_AMBULATORY_CARE_PROVIDER_SITE_OTHER): Payer: Medicare Other | Admitting: Nurse Practitioner

## 2021-12-19 ENCOUNTER — Encounter: Payer: Self-pay | Admitting: Nurse Practitioner

## 2021-12-19 VITALS — BP 157/84 | HR 86 | Temp 98.9°F | Wt 148.0 lb

## 2021-12-19 DIAGNOSIS — N393 Stress incontinence (female) (male): Secondary | ICD-10-CM | POA: Diagnosis not present

## 2021-12-19 DIAGNOSIS — E1159 Type 2 diabetes mellitus with other circulatory complications: Secondary | ICD-10-CM | POA: Diagnosis not present

## 2021-12-19 DIAGNOSIS — I152 Hypertension secondary to endocrine disorders: Secondary | ICD-10-CM | POA: Diagnosis not present

## 2021-12-19 MED ORDER — LISINOPRIL 40 MG PO TABS: 40.0000 mg | ORAL_TABLET | Freq: Every day | ORAL | 1 refills | Status: DC

## 2021-12-19 NOTE — Assessment & Plan Note (Signed)
Chronic. Not well controlled.  Will increase Lisinopril from 20mg  to 40mg  daily.  Continue with the lisinopril.  Continue to check blood pressures at home.  Follow up in 1 month for reevaluation.

## 2021-12-21 ENCOUNTER — Ambulatory Visit: Payer: Medicare Other | Admitting: Nurse Practitioner

## 2022-01-04 ENCOUNTER — Ambulatory Visit: Payer: Medicare Other | Admitting: Nurse Practitioner

## 2022-01-17 NOTE — Progress Notes (Unsigned)
LMP 11/28/1994 (Approximate)    Subjective:    Patient ID: Ellen [name], female    DOB: Jul 09, 1940, 81 y.o.   MRN: [id number]  HPI: Ellen [name] is a 81 y.o. female  No chief complaint on file.  HYPERTENSION Hypertension status: uncontrolled  Satisfied with current treatment? yes Duration of hypertension: years BP monitoring frequency:  daily BP range: 150-160/80 BP medication side effects:  no Medication compliance: excellent compliance Previous BP meds:amlodipine and lisinopril Aspirin: no Recurrent headaches: no Visual changes: no Palpitations: no Dyspnea: no Chest pain: no Lower extremity edema: no Dizzy/lightheaded: no  URINARY SYMPTOMS Patient states she wears pads.  Patient's daughter states that her mom thinks it is normal to wear pads. She is not always wetting the pads.  She does have leakage with coughing or sneezing.      Relevant past medical, surgical, family and social history reviewed and updated as indicated. Interim medical history since our last visit reviewed. Allergies and medications reviewed and updated.  Review of Systems  Eyes:  Negative for visual disturbance.  Respiratory:  Negative for cough, chest tightness and shortness of breath.   Cardiovascular:  Negative for chest pain, palpitations and leg swelling.  Genitourinary:        Bladder leakage  Neurological:  Negative for dizziness and headaches.    Per HPI unless specifically indicated above     Objective:    LMP 11/28/1994 (Approximate)   Wt Readings from Last 3 Encounters:  12/19/21 148 lb (67.1 kg)  12/07/21 150 lb 9.6 oz (68.3 kg)  10/31/21 147 lb 3.2 oz (66.8 kg)    Physical Exam Vitals and nursing note reviewed.  Constitutional:      General: She is not in acute distress.    Appearance: Normal appearance. She is normal weight. She is not ill-appearing, toxic-appearing or diaphoretic.  HENT:     Head: Normocephalic.     Right Ear: External ear normal.      Left Ear: External ear normal.     Nose: Nose normal.     Mouth/Throat:     Mouth: Mucous membranes are moist.     Pharynx: Oropharynx is clear.  Eyes:     General:        Right eye: No discharge.        Left eye: No discharge.     Extraocular Movements: Extraocular movements intact.     Conjunctiva/sclera: Conjunctivae normal.     Pupils: Pupils are equal, round, and reactive to light.  Cardiovascular:     Rate and Rhythm: Normal rate and regular rhythm.     Heart sounds: No murmur heard. Pulmonary:     Effort: Pulmonary effort is normal. No respiratory distress.     Breath sounds: Normal breath sounds. No wheezing or rales.  Musculoskeletal:     Cervical back: Normal range of motion and neck supple.  Skin:    General: Skin is warm and dry.     Capillary Refill: Capillary refill takes less than 2 seconds.  Neurological:     General: No focal deficit present.     Mental Status: She is alert and oriented to person, place, and time. Mental status is at baseline.  Psychiatric:        Mood and Affect: Mood normal.        Behavior: Behavior normal.        Thought Content: Thought content normal.        Judgment: Judgment normal.  Results for orders placed or performed in visit on 12/07/21  HgB A1c  Result Value Ref Range   Hgb A1c MFr Bld 6.0 (H) 4.8 - 5.6 %   Est. average glucose Bld gHb Est-mCnc 126 mg/dL  Lipid Profile  Result Value Ref Range   Cholesterol, Total 140 100 - 199 mg/dL   Triglycerides 95 0 - 149 mg/dL   HDL 65 >39 mg/dL   VLDL Cholesterol Cal 18 5 - 40 mg/dL   LDL Chol Calc (NIH) 57 0 - 99 mg/dL   Chol/HDL Ratio 2.2 0.0 - 4.4 ratio  Comp Met (CMET)  Result Value Ref Range   Glucose 98 70 - 99 mg/dL   BUN 14 8 - 27 mg/dL   Creatinine, Ser 0.62 0.57 - 1.00 mg/dL   eGFR 91 >59 mL/min/1.73   BUN/Creatinine Ratio 23 12 - 28   Sodium 141 134 - 144 mmol/L   Potassium 3.5 3.5 - 5.2 mmol/L   Chloride 102 96 - 106 mmol/L   CO2 24 20 - 29 mmol/L   Calcium  9.6 8.7 - 10.3 mg/dL   Total Protein 6.7 6.0 - 8.5 g/dL   Albumin 4.2 3.7 - 4.7 g/dL   Globulin, Total 2.5 1.5 - 4.5 g/dL   Albumin/Globulin Ratio 1.7 1.2 - 2.2   Bilirubin Total 0.2 0.0 - 1.2 mg/dL   Alkaline Phosphatase 111 44 - 121 IU/L   AST 20 0 - 40 IU/L   ALT 11 0 - 32 IU/L  CBC w/Diff  Result Value Ref Range   WBC 5.2 3.4 - 10.8 x10E3/uL   RBC 4.22 3.77 - 5.28 x10E6/uL   Hemoglobin 11.7 11.1 - 15.9 g/dL   Hematocrit 37.2 34.0 - 46.6 %   MCV 88 79 - 97 fL   MCH 27.7 26.6 - 33.0 pg   MCHC 31.5 31.5 - 35.7 g/dL   RDW 13.9 11.7 - 15.4 %   Platelets 267 150 - 450 x10E3/uL   Neutrophils 60 Not Estab. %   Lymphs 27 Not Estab. %   Monocytes 8 Not Estab. %   Eos 4 Not Estab. %   Basos 1 Not Estab. %   Neutrophils Absolute 3.1 1.4 - 7.0 x10E3/uL   Lymphocytes Absolute 1.4 0.7 - 3.1 x10E3/uL   Monocytes Absolute 0.4 0.1 - 0.9 x10E3/uL   EOS (ABSOLUTE) 0.2 0.0 - 0.4 x10E3/uL   Basophils Absolute 0.1 0.0 - 0.2 x10E3/uL   Immature Granulocytes 0 Not Estab. %   Immature Grans (Abs) 0.0 0.0 - 0.1 x10E3/uL  Urine Microalbumin w/creat. ratio  Result Value Ref Range   Creatinine, Urine 33.5 Not Estab. mg/dL   Microalbumin, Urine 13.0 Not Estab. ug/mL   Microalb/Creat Ratio 39 (H) 0 - 29 mg/g creat  B12  Result Value Ref Range   Vitamin B-12 >2000 (H) 232 - 1245 pg/mL      Assessment & Plan:   Problem List Items Addressed This Visit      Cardiovascular and Mediastinum   Hypertension associated with diabetes ([place]) - Primary     Follow up plan: No follow-ups on file.

## 2022-01-18 ENCOUNTER — Encounter: Payer: Self-pay | Admitting: Nurse Practitioner

## 2022-01-18 ENCOUNTER — Ambulatory Visit (INDEPENDENT_AMBULATORY_CARE_PROVIDER_SITE_OTHER): Payer: Medicare Other | Admitting: Nurse Practitioner

## 2022-01-18 VITALS — BP 146/86 | HR 75 | Temp 98.4°F | Wt 149.4 lb

## 2022-01-18 DIAGNOSIS — I152 Hypertension secondary to endocrine disorders: Secondary | ICD-10-CM | POA: Diagnosis not present

## 2022-01-18 DIAGNOSIS — E1159 Type 2 diabetes mellitus with other circulatory complications: Secondary | ICD-10-CM | POA: Diagnosis not present

## 2022-01-18 MED ORDER — CHLORTHALIDONE 25 MG PO TABS: 25.0000 mg | ORAL_TABLET | Freq: Every day | ORAL | 0 refills | Status: DC

## 2022-01-18 NOTE — Assessment & Plan Note (Signed)
Chronic. Not well controlled.  Running 150-160/80-90 at home.  Will add Chlorthalidone to patient's regimen.  Continue with Amlodipine and Lisinopril.  Follow up in 1 month.  Will recheck labs at next visit. Continue to check blood pressures at home and bring log to next visit.

## 2022-01-30 ENCOUNTER — Encounter: Payer: Self-pay | Admitting: Nurse Practitioner

## 2022-01-30 ENCOUNTER — Ambulatory Visit: Payer: Self-pay

## 2022-01-30 MED ORDER — TIZANIDINE HCL 2 MG PO TABS: 2.0000 mg | ORAL_TABLET | Freq: Three times a day (TID) | ORAL | 0 refills | Status: DC | PRN

## 2022-01-30 NOTE — Telephone Encounter (Signed)
  Chief Complaint: PT requesting Flexeril for stiff neck Symptoms: Painful stiff neck - muscle spasms Frequency: Friday Pertinent Negatives: Patient denies  Disposition: [] ED /[] Urgent Care (no appt availability in office) / [] Appointment(In office/virtual)/ []  [place] [facility]/ [] Home Care/ [] Refused Recommended Disposition /[] [facility]/ [x]  Follow-up with PCP Additional Notes: S/w daughter, . Pt is having muscle spasms in her neck and is requesting a refill of flexeril for same.   Please call back and let know if Rx will be refilled.     Summary: Pt daughter request that a nurse call her back regarding Rx for pt muscle spasms   Pt's daughter stated pt experiencing muscle spasms in her neck and she would like to request that a nurse return her call to discuss Rx for muscle relaxer. Cb# [phone]      Reason for Disposition  [1] Prescription refill request for ESSENTIAL medicine (i.e., likelihood of harm to patient if not taken) AND [2] triager unable to refill per department policy  Answer Assessment - Initial Assessment Questions 1. DRUG NAME: "What medicine do you need to have refilled?"     Flexeril 2. REFILLS REMAINING: "How many refills are remaining?" (Note: The label on the medicine or pill bottle will show how many refills are remaining. If there are no refills remaining, then a renewal may be needed.)     none 3. EXPIRATION DATE: "What is the expiration date?" (Note: The label states when the prescription will expire, and thus can no longer be refilled.)      4. PRESCRIBING HCP: "Who prescribed it?" Reason: If prescribed by specialist, call should be referred to that group.     5. SYMPTOMS: "Do you have any symptoms?"     Stiff neck 6. PREGNANCY: "Is there any chance that you are pregnant?" "When was your last menstrual period?"     na  Protocols used: Medication Refill and Renewal Call-A-AH

## 2022-01-30 NOTE — Telephone Encounter (Signed)
Spoke with daughter Marylene Land, voiced understanding.

## 2022-01-31 ENCOUNTER — Ambulatory Visit (INDEPENDENT_AMBULATORY_CARE_PROVIDER_SITE_OTHER): Payer: Medicare Other

## 2022-01-31 ENCOUNTER — Telehealth: Payer: Medicare Other

## 2022-01-31 DIAGNOSIS — R809 Proteinuria, unspecified: Secondary | ICD-10-CM

## 2022-01-31 DIAGNOSIS — E1169 Type 2 diabetes mellitus with other specified complication: Secondary | ICD-10-CM

## 2022-01-31 DIAGNOSIS — I152 Hypertension secondary to endocrine disorders: Secondary | ICD-10-CM

## 2022-01-31 DIAGNOSIS — M791 Myalgia, unspecified site: Secondary | ICD-10-CM

## 2022-01-31 DIAGNOSIS — M542 Cervicalgia: Secondary | ICD-10-CM

## 2022-01-31 NOTE — Chronic Care Management (AMB) (Signed)
Chronic Care Management   CCM RN Visit Note  01/31/2022 Name: Ellen [name] MRN: [id number] DOB: April 23, 1941  Subjective: Ellen [name] is a 81 y.o. year old female who is a primary care patient of Ellen Billings, NP. The care management team was consulted for assistance with disease management and care coordination needs.    Engaged with patient by telephone for follow up visit in response to provider referral for case management and/or care coordination services.   Consent to Services:  The patient was given information about Chronic Care Management services, agreed to services, and gave verbal consent prior to initiation of services.  Please see initial visit note for detailed documentation.   Patient agreed to services and verbal consent obtained.   Assessment: Review of patient past medical history, allergies, medications, health status, including review of consultants reports, laboratory and other test data, was performed as part of comprehensive evaluation and provision of chronic care management services.   SDOH (Social Determinants of Health) assessments and interventions performed:    CCM Care Plan  No Known Allergies  Outpatient Encounter Medications as of 01/31/2022  Medication Sig   amLODipine (NORVASC) 10 MG tablet Take 1 tablet (10 mg total) by mouth daily.   aspirin 81 MG tablet Take 81 mg by mouth daily.   atorvastatin (LIPITOR) 20 MG tablet Take 1 tablet (20 mg total) by mouth daily.   blood glucose meter kit and supplies KIT Dispense based on patient and insurance preference. Use up to four times daily as directed. (FOR ICD-9 250.00, 250.01).   CALCIUM-MAGNESIUM-ZINC PO Take 1 tablet by mouth daily.   calcium-vitamin D 250-100 MG-UNIT tablet Take 1 tablet by mouth 2 (two) times daily.   chlorthalidone (HYGROTON) 25 MG tablet Take 1 tablet (25 mg total) by mouth daily.   Cholecalciferol (VITAMIN D3) 1000 units CAPS Take 2 capsules by mouth daily.     lisinopril (ZESTRIL) 40 MG tablet Take 1 tablet (40 mg total) by mouth daily.   Magnesium 500 MG TABS Take 1 tablet by mouth daily.   metFORMIN (GLUCOPHAGE) 500 MG tablet Start out taking 1 tablet (500 MG) by mouth every morning with meal, then on week two start taking 1 tablet (500 MG) by mouth in morning with meal and 1 tablet (500 MG) in evening with meal.   tiZANidine (ZANAFLEX) 2 MG tablet Take 1 tablet (2 mg total) by mouth every 8 (eight) hours as needed for muscle spasms.   vitamin C (ASCORBIC ACID) 500 MG tablet Take 500 mg by mouth daily.   No facility-administered encounter medications on file as of 01/31/2022.    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[place]) 03/29/2015    Conditions to be addressed/monitored:HTN, HLD, DMII, and Pain in her neck  Care Plan : RNCM: General Plan of Care (Adult) for Chronic Disease Management and Care Coordination Needs  Updates made by Vanita Ingles, RN since 01/31/2022 12:00 AM     Problem: RNCM: Development of Plan of Care for Chronic Disease Management (HTN, HLD, DM2, Memory Changes)   Priority: High  Long-Range Goal: RNCM: Effective Management of Plan of Care for Chronic Disease Management (HTN, HLD, DM2, Memory Changes) Completed 01/31/2022  Start Date: 05/18/2021  Expected End Date: 05/18/2022  Priority: High  Note:   Current Barriers: 01-31-2022: The patient has met the goals of care and the plan of care is being closed. The patient has had a change in her blood pressures and a new medication was added  earlier this month. The patient also has acute onset of neck "muscle spasms". The patient is taking Tizanidine and rates her level of pain today at an 8. She states that her pain level is coming down. She knows to call the [facility] for new changes, questions, or concerns.  Knowledge Deficits related to plan of care for management of HTN, HLD, DMII, and Memory changes   Care Coordination needs related to Memory Deficits  Chronic Disease Management support and education needs related to HTN, HLD, DMII, and Memory changes  Family report of memory changes specifically short term memory   RNCM Clinical Goal(s):  Patient will verbalize basic understanding of HTN, HLD, DMII, and Memory Changes  disease process and self health management plan as evidenced by following the plan of care, taking medications, keeping appointments and calling the office for questions or concerns take all medications exactly as prescribed and will call provider for medication related questions as evidenced by compliance and calling for refills before running out of medications     attend all scheduled medical appointments: attend routine visits with pcp and specialist, as evidenced by keeping appointments and calling for reschedule needs         demonstrate improved and ongoing adherence to prescribed treatment plan for HTN, HLD, DMII, and memory changes as evidenced by working with the pcp and CCM team to optimize the plan of care and effectively manage health and well being demonstrate a decrease in memory changes exacerbations  as evidenced by working with the pcp and CCM team to effectively manage short term memory problems  demonstrate ongoing self health care management ability for effective management of Chronic conditions  as evidenced by working with the CCM team  through collaboration with Consulting civil engineer, provider, and care team.   Interventions: 1:1 collaboration with primary care provider regarding development and update  of comprehensive plan of care as evidenced by provider attestation and co-signature Inter-disciplinary care team collaboration (see longitudinal plan of care) Evaluation of current treatment plan related to  self management and patient's adherence to plan as established by provider   Diabetes:  (Status: Goal Met.) Long Term Goal   Lab Results  Component Value Date   HGBA1C 6.0 (H) 12/08/2021  Previous A1C 5.9.  Assessed patient's understanding of A1c goal: <7%. 01-31-2022: The patient is below goal. Doing will with effective management of her DM.  Provided education to patient about basic DM disease process. 11-08-2021: The patient has a good understanding of how to effectively manage her DM. The patient is maintaining her weight and drinking nutrition drinks. Denies any issues with her DM health and well being.  Reviewed medications with patient and discussed importance of medication adherence. 11-08-2021: The patient endorses compliance with medications. Denies any issues with medication management;        Reviewed prescribed diet with patient heart healthy/ADA diet. 07-26-2021: The patient is drinking 2 boost daily to aide in nutrition and have no further weight loss. The patient endorses no further weight loss. 09-06-2021: The patients weight today was 149. She is drinking  boost at home. Gave the patient samples of Glucerna with coupons at face to face visit with the patient. Neurosurgeon and calendar provided with [facility] contact information. 11-08-2021: The patient is maintaining her weight. Denies any weight loss. States that she is still drinking the supplements at home. Eating well and sleeping well. 01-31-2022: The patient is drinking her boost and her A1C is WNL. ; Counseled on importance of regular laboratory monitoring as prescribed. 07-26-2021: The patient has current lab testing on a routine basis. Last on 06-08-2021. 09-06-2021: New lab work drawn today, pending results.  11-08-2021: The  patient has regular lab work. Last in March. Sees pcp again in June.  01-31-2022: Has regular lab work     Discussed plans with patient for ongoing care management follow up and provided patient with direct contact information for care management team;      Provided patient with written educational materials related to hypo and hyperglycemia and importance of correct treatment. 01-31-2022: The patient denies any episodes of hypo or hyperglycemia. States her readings have been good. States she has not been taking frequently but the last time she did it was good. Denies any light headedness, dizziness, diaphoresis or other issues related to hypo or hyperglycemia. ;       Reviewed scheduled/upcoming provider appointments including: 02-22-2022 at 340 pm;         Advised patient, providing education and rationale, to check cbg when you have symptoms of low or high blood sugar, before and after exercise, and as directed   and record. 07-26-2021: The patient denies any issues with her blood sugars. States she is doing well. 11-08-2021: The patient states she is doing well with her DM management. She did not have any readings available today to review. Education and support given.       call provider for findings outside established parameters;       Review of patient status, including review of consultants reports, relevant laboratory and other test results, and medications completed. 09-06-2021: New lab work drawn today. Pending results. 01-31-2022: The patient is below goal and doing well.       Screening for signs and symptoms of depression related to chronic disease state;        Assessed social determinant of health barriers;        Had eye exam today (07-26-2021). States all was good. No changes in her vision. The doctor actually told her that she did not need glasses. The patient was happy that her eye exam went well. Provided information to the patients daughter Levada Dy that her eye exam was good.  Memory Changes    (Status: Goal Met.) Long Term Goal  Evaluation of current treatment plan related to  Memory Changes  , Memory Deficits self-management and patient's adherence to plan as established by provider. 07-26-2021: The patient saw the pcp in December, accompanied by daughter. The patient scored a 28 on her memory test. The provider recommended crossword puzzles and memory games. The patients daughter states that she is satisfied with the results. She states that she and other members of the family can see if more. She states that her long term memory is good but her short term memory is what she sees some deficits in but it is not bad. The patient will often ask her several times about appointments and other things.  No acute findings today. Will continue to monitor for changes or needs. 11-08-2021: The patient denies any changes in her memory. The  patient states she feels she is doing well and denies any acute findings today. Will continue to monitor for changes or new needs. Also spoke to her daughter Levada Dy and she says that she has good days and bad days and she thinks she catches herself when she is repeating herself or asking the same questions. She does not feel like it is any worse than usual. Will continue to monitor for changes or new concerns related to memory loss.  Discussed plans with patient for ongoing care management follow up and provided patient with direct contact information for care management team Advised patient to write down questions to ask the provider at upcoming visit with pcp and to communicate memory changes noted by the family; Provided education to patient re: cognitive changes to look for, importance of making sure that the patient is taking medications as directed and the importance of safety for the patient; Collaborated with pcp regarding discussion with daughter about concerns of her and her other siblings noticing changes in memory, specifically short term memory.  Advised the  patients daughter, Levada Dy to discuss this with the pcp at upcoming appointment on 06-08-2021.  Levada Dy is planning on coming to the appointment with the patient. She wants to do what she can to help the patient maintain independence and get the things she needs to remain stable in her health. 07-26-2021: The patient states that she is doing well. The daughter is pleased with the evaluation of the patients memory. The pcp addressed this in December at her appointment and will evaluate ongoing at subsequent visits.  Provided patient with memory enhancement educational materials related to memory changes and help with resources in memory enhancement; Reviewed scheduled/upcoming provider appointments including 12-07-2021 at 940 am; Discussed plans with patient for ongoing care management follow up and provided patient with direct contact information for care management team; Advised patient to discuss concerns and changes noted in memory  with provider; Screening for signs and symptoms of depression related to chronic disease state;  Assessed social determinant of health barriers;   Hyperlipidemia:  (Status: Goal Met.) Long Term Goal  Lab Results  Component Value Date   CHOL 140 12/08/2021   HDL 65 12/08/2021   LDLCALC 57 12/08/2021   TRIG 95 12/08/2021   CHOLHDL 2.2 12/08/2021   Medication review performed; medication list updated in electronic medical record. 01-31-2022: The patient is compliant with Lipitor 20 mg daily. Denies any medication concerns at this time.  Provider established cholesterol goals reviewed. 07-26-2021: Praised the patient for being at goal with her cholesterol levels. 11-08-2021: The patient remains at goal with cholesterol levels. The patient praised for being at goal and effectively managing HLD health and well being. Counseled on importance of regular laboratory monitoring as prescribed. 07-26-2021: has regular lab work. Last in December of 2022. 11-08-2021: Had lab work done on  09-06-2021 and the patients cholesterol levels are WNL. 01-31-2022: Has labs on a regular basis Provided HLD educational materials; Reviewed role and benefits of statin for ASCVD risk reduction; Discussed strategies to manage statin-induced myalgias; Reviewed importance of limiting foods high in cholesterol. 11-08-2021: Review of heart healthy/ADA diet;  Hypertension: (Status: Goal Met.) Last practice recorded BP readings:  BP Readings from Last 3 Encounters:  01/18/22 (!) 146/86  12/19/21 (!) 157/84  12/07/21 (!) 163/86  Most recent eGFR/CrCl:  Lab Results  Component Value Date   EGFR 91 12/08/2021    No components found for: CRCL  Evaluation of current treatment plan related to  hypertension self management and patient's adherence to plan as established by provider. 07-26-2021: The patient is doing well and denies any issues with her blood pressures or HTN health. Was having some headaches and saw pcp x 2 in January for evaluation and treatment options. The pcp felt it was likely sinus related. The patient denies headaches or other issues today. Will continue to monitor for changes;  09-06-2021: The patient is doing well today and denies any acute changes with her HTN or heart health. Will continue to monitor for changes or new concerns. 11-08-2021: The patient was in the office last week to see the pcp due to a cough and blood pressure was elevated. The patient denies any acute findings today and states her cough is better but still there. Denies any issues with HTN or heart health. 01-31-2022: The patient has had higher than normal blood pressures. Was started on Chlorthalidone and her readings are coming down. Self reported readings: 8-12-: 135/77, 8-13: 158/93, 8-14: 150/93. She states she is keeping a close check on it and will see the pcp on 02-22-2022. Knows to call for changes or new needs.  Provided education to patient re: stroke prevention, s/s of heart attack and stroke; Reviewed prescribed  diet heart healthy/ADA diet  Reviewed medications with patient and discussed importance of compliance. 01-31-2022: The patient is compliant with medications  Discussed plans with patient for ongoing care management follow up and provided patient with direct contact information for care management team; Advised patient, providing education and rationale, to monitor blood pressure daily and record, calling PCP for findings outside established parameters;  Advised patient to discuss blood pressure changes  with provider; Provided education on prescribed diet heart healthy/ADA diet. 09-06-2021: Written copy of healthy eating provided to the patient today along with a calendar and contact information for the [facility]. The patient also given samples of Glucerna with coupons. Education provided. Ongoing support and education from the CCM team. Will continue to monitor.;  Discussed complications of poorly controlled blood pressure such as heart disease, stroke, circulatory complications, vision complications, kidney impairment, sexual dysfunction;    cough  (Status: Goal Met.) Short Term Goal  Evaluation of current treatment plan related to  cough ,  self-management and patient's adherence to plan as established by provider. Discussed plans with patient for ongoing care management follow up and provided patient with direct contact information for care management team Advised patient to call the office for fever, worsening cough, chills, questions or concerns. Levada Dy the patients daughter would like for the patient to have a chest x-ray due to the cough not resolving and it has been one week. Will collaborate with the pcp for recommendations. ; Provided education to patient re: monitoring for sx and sx of infection, worsening respiratory concerns, and calling the office for questions and concerns; Reviewed medications with patient and discussed The patient has been taking OTC cough medications but states she is still  is coughing. She states that it is some better but still there. Her daughter is concerned that it may turn into pneumonia since she was with her this weekend and could hear "rattling" in her chest. ; Collaborated with pcp  regarding daughters request for the patient to have a chest xray due to the cough not resolving and it has been one week. The daughter is concerned that the patients congestion may be turning into pneumonia. Secure message sent to the pcp asking for recommendations. Will continue to monitor and assist. ; Reviewed scheduled/upcoming provider  appointments including 12-07-2021 at 0940 am, but the patient may need follow up sooner for ongoing cough x 1 week, saw pcp last week. Will collaborate with the pcp concerning request. ; Discussed plans with patient for ongoing care management follow up and provided patient with direct contact information for care management team; Advised patient to discuss unresolved cough and "chest congestion" with provider;   Patient Goals/Self-Care Activities: Take medications as prescribed   Attend all scheduled provider appointments Call pharmacy for medication refills 3-7 days in advance of running out of medications Attend church or other social activities Perform all self care activities independently  Perform IADL's (shopping, preparing meals, housekeeping, managing finances) independently Call provider office for new concerns or questions  Work with the social worker to address care coordination needs and will continue to work with the clinical team to address health care and disease management related needs call the Suicide and Crisis Lifeline: 988 call the [place] National Suicide Prevention Lifeline: 947-337-8687 or TTY: 445-446-6175 TTY 601-244-1410) to talk to a trained counselor call 1-800-273-TALK (toll free, 24 hour hotline) if experiencing a Mental Health or [place]  schedule appointment with eye doctor. 09-06-2021: Saw the  eye doctor on 07-26-2021 and no new changes in her vision  check blood sugar at prescribed times: when you have symptoms of low or high blood sugar, before and after exercise, and as directed by pcp  check feet daily for cuts, sores or redness enter blood sugar readings and medication or insulin into daily log take the blood sugar log to all doctor visits trim toenails straight across drink 6 to 8 glasses of water each day eat fish at least once per week fill half of plate with vegetables limit fast food meals to no more than 1 per week manage portion size prepare main meal at home 3 to 5 days each week read food labels for fat, fiber, carbohydrates and portion size reduce red meat to 2 to 3 times a week keep feet up while sitting wash and dry feet carefully every day wear comfortable, cotton socks wear comfortable, well-fitting shoes check blood pressure weekly choose a place to take my blood pressure (home, clinic or office, retail store) write blood pressure results in a log or diary learn about high blood pressure keep a blood pressure log take blood pressure log to all doctor appointments call doctor for signs and symptoms of high blood pressure develop an action plan for high blood pressure keep all doctor appointments take medications for blood pressure exactly as prescribed report new symptoms to your doctor eat more whole grains, fruits and vegetables, lean meats and healthy fats - call for medicine refill 2 or 3 days before it runs out - take all medications exactly as prescribed - call doctor with any symptoms you believe are related to your medicine - call doctor when you experience any new symptoms - go to all doctor appointments as scheduled - adhere to prescribed diet: Heart healthy/ADA       Plan:No further follow up required: the patient has met the plan of care and no further outreach is warranted by the [facility]. The patient has the [facility] number to call for  changes, questions, or concerns.   Noreene Larsson RN, MSN, [facility] Mobile: [phone]

## 2022-01-31 NOTE — Patient Instructions (Signed)
Visit Information  Thank you for taking time to visit with me today. Please don't hesitate to contact me if I can be of assistance to you before our next scheduled telephone appointment.  Following are the goals we discussed today:  Current Barriers: 01-31-2022: The patient has met the goals of care and the plan of care is being closed. The patient has had a change in her blood pressures and a new medication was added earlier this month. The patient also has acute onset of neck "muscle spasms". The patient is taking Tizanidine and rates her level of pain today at an 8. She states that her pain level is coming down. She knows to call the [facility] for new changes, questions, or concerns.  Knowledge Deficits related to plan of care for management of HTN, HLD, DMII, and Memory changes   Care Coordination needs related to Memory Deficits  Chronic Disease Management support and education needs related to HTN, HLD, DMII, and Memory changes  Family report of memory changes specifically short term memory    RNCM Clinical Goal(s):  Patient will verbalize basic understanding of HTN, HLD, DMII, and Memory Changes  disease process and self health management plan as evidenced by following the plan of care, taking medications, keeping appointments and calling the office for questions or concerns take all medications exactly as prescribed and will call provider for medication related questions as evidenced by compliance and calling for refills before running out of medications     attend all scheduled medical appointments: attend routine visits with pcp and specialist, as evidenced by keeping appointments and calling for reschedule needs         demonstrate improved and ongoing adherence to prescribed treatment plan for HTN, HLD, DMII, and memory changes as evidenced by working with the pcp and CCM team to optimize the plan of care and effectively manage health and well being demonstrate a decrease in memory changes  exacerbations  as evidenced by working with the pcp and CCM team to effectively manage short term memory problems  demonstrate ongoing self health care management ability for effective management of Chronic conditions  as evidenced by working with the CCM team  through collaboration with Consulting civil engineer, provider, and care team.    Interventions: 1:1 collaboration with primary care provider regarding development and update of comprehensive plan of care as evidenced by provider attestation and co-signature Inter-disciplinary care team collaboration (see longitudinal plan of care) Evaluation of current treatment plan related to  self management and patient's adherence to plan as established by provider     Diabetes:  (Status: Goal Met.) Long Term Goal         Lab Results  Component Value Date    HGBA1C 6.0 (H) 12/08/2021  Previous A1C 5.9.  Assessed patient's understanding of A1c goal: <7%. 01-31-2022: The patient is below goal. Doing will with effective management of her DM.  Provided education to patient about basic DM disease process. 11-08-2021: The patient has a good understanding of how to effectively manage her DM. The patient is maintaining her weight and drinking nutrition drinks. Denies any issues with her DM health and well being.  Reviewed medications with patient and discussed importance of medication adherence. 11-08-2021: The patient endorses compliance with medications. Denies any issues with medication management;        Reviewed prescribed diet with patient heart healthy/ADA diet. 07-26-2021: The patient is drinking 2 boost daily to aide in nutrition and have no further weight loss. The  patient endorses no further weight loss. 09-06-2021: The patients weight today was 149. She is drinking boost at home. Gave the patient samples of Glucerna with coupons at face to face visit with the patient. Neurosurgeon and calendar provided with [facility] contact information. 11-08-2021: The patient  is maintaining her weight. Denies any weight loss. States that she is still drinking the supplements at home. Eating well and sleeping well. 01-31-2022: The patient is drinking her boost and her A1C is WNL. ; Counseled on importance of regular laboratory monitoring as prescribed. 07-26-2021: The patient has current lab testing on a routine basis. Last on 06-08-2021. 09-06-2021: New lab work drawn today, pending results.  11-08-2021: The patient has regular lab work. Last in March. Sees pcp again in June.  01-31-2022: Has regular lab work     Discussed plans with patient for ongoing care management follow up and provided patient with direct contact information for care management team;      Provided patient with written educational materials related to hypo and hyperglycemia and importance of correct treatment. 01-31-2022: The patient denies any episodes of hypo or hyperglycemia. States her readings have been good. States she has not been taking frequently but the last time she did it was good. Denies any light headedness, dizziness, diaphoresis or other issues related to hypo or hyperglycemia. ;       Reviewed scheduled/upcoming provider appointments including: 02-22-2022 at 340 pm;         Advised patient, providing education and rationale, to check cbg when you have symptoms of low or high blood sugar, before and after exercise, and as directed   and record. 07-26-2021: The patient denies any issues with her blood sugars. States she is doing well. 11-08-2021: The patient states she is doing well with her DM management. She did not have any readings available today to review. Education and support given.       call provider for findings outside established parameters;       Review of patient status, including review of consultants reports, relevant laboratory and other test results, and medications completed. 09-06-2021: New lab work drawn today. Pending results. 01-31-2022: The patient is below goal and doing well.        Screening for signs and symptoms of depression related to chronic disease state;        Assessed social determinant of health barriers;        Had eye exam today (07-26-2021). States all was good. No changes in her vision. The doctor actually told her that she did not need glasses. The patient was happy that her eye exam went well. Provided information to the patients daughter Levada Dy that her eye exam was good.   Memory Changes   (Status: Goal Met.) Long Term Goal  Evaluation of current treatment plan related to  Memory Changes  , Memory Deficits self-management and patient's adherence to plan as established by provider. 07-26-2021: The patient saw the pcp in December, accompanied by daughter. The patient scored a 28 on her memory test. The provider recommended crossword puzzles and memory games. The patients daughter states that she is satisfied with the results. She states that she and other members of the family can see if more. She states that her long term memory is good but her short term memory is what she sees some deficits in but it is not bad. The patient will often ask her several times about appointments and other things.  No acute findings today. Will  continue to monitor for changes or needs. 11-08-2021: The patient denies any changes in her memory. The patient states she feels she is doing well and denies any acute findings today. Will continue to monitor for changes or new needs. Also spoke to her daughter Levada Dy and she says that she has good days and bad days and she thinks she catches herself when she is repeating herself or asking the same questions. She does not feel like it is any worse than usual. Will continue to monitor for changes or new concerns related to memory loss.  Discussed plans with patient for ongoing care management follow up and provided patient with direct contact information for care management team Advised patient to write down questions to ask the provider at upcoming  visit with pcp and to communicate memory changes noted by the family; Provided education to patient re: cognitive changes to look for, importance of making sure that the patient is taking medications as directed and the importance of safety for the patient; Collaborated with pcp regarding discussion with daughter about concerns of her and her other siblings noticing changes in memory, specifically short term memory.  Advised the patients daughter, Levada Dy to discuss this with the pcp at upcoming appointment on 06-08-2021.  Levada Dy is planning on coming to the appointment with the patient. She wants to do what she can to help the patient maintain independence and get the things she needs to remain stable in her health. 07-26-2021: The patient states that she is doing well. The daughter is pleased with the evaluation of the patients memory. The pcp addressed this in December at her appointment and will evaluate ongoing at subsequent visits.  Provided patient with memory enhancement educational materials related to memory changes and help with resources in memory enhancement; Reviewed scheduled/upcoming provider appointments including 12-07-2021 at 940 am; Discussed plans with patient for ongoing care management follow up and provided patient with direct contact information for care management team; Advised patient to discuss concerns and changes noted in memory  with provider; Screening for signs and symptoms of depression related to chronic disease state;  Assessed social determinant of health barriers;    Hyperlipidemia:  (Status: Goal Met.) Long Term Goal       Lab Results  Component Value Date    CHOL 140 12/08/2021    HDL 65 12/08/2021    LDLCALC 57 12/08/2021    TRIG 95 12/08/2021    CHOLHDL 2.2 12/08/2021    Medication review performed; medication list updated in electronic medical record. 01-31-2022: The patient is compliant with Lipitor 20 mg daily. Denies any medication concerns at this time.   Provider established cholesterol goals reviewed. 07-26-2021: Praised the patient for being at goal with her cholesterol levels. 11-08-2021: The patient remains at goal with cholesterol levels. The patient praised for being at goal and effectively managing HLD health and well being. Counseled on importance of regular laboratory monitoring as prescribed. 07-26-2021: has regular lab work. Last in December of 2022. 11-08-2021: Had lab work done on 09-06-2021 and the patients cholesterol levels are WNL. 01-31-2022: Has labs on a regular basis Provided HLD educational materials; Reviewed role and benefits of statin for ASCVD risk reduction; Discussed strategies to manage statin-induced myalgias; Reviewed importance of limiting foods high in cholesterol. 11-08-2021: Review of heart healthy/ADA diet;   Hypertension: (Status: Goal Met.) Last practice recorded BP readings:     BP Readings from Last 3 Encounters:  01/18/22 (!) 146/86  12/19/21 (!) 157/84  12/07/21 Marland Kitchen)  163/86  Most recent eGFR/CrCl:       Lab Results  Component Value Date    EGFR 91 12/08/2021    No components found for: CRCL   Evaluation of current treatment plan related to hypertension self management and patient's adherence to plan as established by provider. 07-26-2021: The patient is doing well and denies any issues with her blood pressures or HTN health. Was having some headaches and saw pcp x 2 in January for evaluation and treatment options. The pcp felt it was likely sinus related. The patient denies headaches or other issues today. Will continue to monitor for changes;  09-06-2021: The patient is doing well today and denies any acute changes with her HTN or heart health. Will continue to monitor for changes or new concerns. 11-08-2021: The patient was in the office last week to see the pcp due to a cough and blood pressure was elevated. The patient denies any acute findings today and states her cough is better but still there. Denies any  issues with HTN or heart health. 01-31-2022: The patient has had higher than normal blood pressures. Was started on Chlorthalidone and her readings are coming down. Self reported readings: 8-12-: 135/77, 8-13: 158/93, 8-14: 150/93. She states she is keeping a close check on it and will see the pcp on 02-22-2022. Knows to call for changes or new needs.  Provided education to patient re: stroke prevention, s/s of heart attack and stroke; Reviewed prescribed diet heart healthy/ADA diet  Reviewed medications with patient and discussed importance of compliance. 01-31-2022: The patient is compliant with medications  Discussed plans with patient for ongoing care management follow up and provided patient with direct contact information for care management team; Advised patient, providing education and rationale, to monitor blood pressure daily and record, calling PCP for findings outside established parameters;  Advised patient to discuss blood pressure changes  with provider; Provided education on prescribed diet heart healthy/ADA diet. 09-06-2021: Written copy of healthy eating provided to the patient today along with a calendar and contact information for the [facility]. The patient also given samples of Glucerna with coupons. Education provided. Ongoing support and education from the CCM team. Will continue to monitor.;  Discussed complications of poorly controlled blood pressure such as heart disease, stroke, circulatory complications, vision complications, kidney impairment, sexual dysfunction;      cough  (Status: Goal Met.) Short Term Goal  Evaluation of current treatment plan related to  cough ,  self-management and patient's adherence to plan as established by provider. Discussed plans with patient for ongoing care management follow up and provided patient with direct contact information for care management team Advised patient to call the office for fever, worsening cough, chills, questions or concerns. Levada Dy  the patients daughter would like for the patient to have a chest x-ray due to the cough not resolving and it has been one week. Will collaborate with the pcp for recommendations. ; Provided education to patient re: monitoring for sx and sx of infection, worsening respiratory concerns, and calling the office for questions and concerns; Reviewed medications with patient and discussed The patient has been taking OTC cough medications but states she is still is coughing. She states that it is some better but still there. Her daughter is concerned that it may turn into pneumonia since she was with her this weekend and could hear "rattling" in her chest. ; Collaborated with pcp  regarding daughters request for the patient to have a chest xray due to  the cough not resolving and it has been one week. The daughter is concerned that the patients congestion may be turning into pneumonia. Secure message sent to the pcp asking for recommendations. Will continue to monitor and assist. ; Reviewed scheduled/upcoming provider appointments including 12-07-2021 at [place] am, but the patient may need follow up sooner for ongoing cough x 1 week, saw pcp last week. Will collaborate with the pcp concerning request. ; Discussed plans with patient for ongoing care management follow up and provided patient with direct contact information for care management team; Advised patient to discuss unresolved cough and "chest congestion" with provider;    Patient Goals/Self-Care Activities: Take medications as prescribed   Attend all scheduled provider appointments Call pharmacy for medication refills 3-7 days in advance of running out of medications Attend church or other social activities Perform all self care activities independently  Perform IADL's (shopping, preparing meals, housekeeping, managing finances) independently Call provider office for new concerns or questions  Work with the social worker to address care coordination  needs and will continue to work with the clinical team to address health care and disease management related needs call the Suicide and Crisis Lifeline: 988 call the [place] National Suicide Prevention Lifeline: 725-445-9041 or TTY: (986) 532-6934 TTY 747 392 4236) to talk to a trained counselor call 1-800-273-TALK (toll free, 24 hour hotline) if experiencing a Mental Health or [place]  schedule appointment with eye doctor. 09-06-2021: Saw the eye doctor on 07-26-2021 and no new changes in her vision  check blood sugar at prescribed times: when you have symptoms of low or high blood sugar, before and after exercise, and as directed by pcp  check feet daily for cuts, sores or redness enter blood sugar readings and medication or insulin into daily log take the blood sugar log to all doctor visits trim toenails straight across drink 6 to 8 glasses of water each day eat fish at least once per week fill half of plate with vegetables limit fast food meals to no more than 1 per week manage portion size prepare main meal at home 3 to 5 days each week read food labels for fat, fiber, carbohydrates and portion size reduce red meat to 2 to 3 times a week keep feet up while sitting wash and dry feet carefully every day wear comfortable, cotton socks wear comfortable, well-fitting shoes check blood pressure weekly choose a place to take my blood pressure (home, clinic or office, retail store) write blood pressure results in a log or diary learn about high blood pressure keep a blood pressure log take blood pressure log to all doctor appointments call doctor for signs and symptoms of high blood pressure develop an action plan for high blood pressure keep all doctor appointments take medications for blood pressure exactly as prescribed report new symptoms to your doctor eat more whole grains, fruits and vegetables, lean meats and healthy fats - call for medicine refill 2 or 3 days  before it runs out - take all medications exactly as prescribed - call doctor with any symptoms you believe are related to your medicine - call doctor when you experience any new symptoms - go to all doctor appointments as scheduled - adhere to prescribed diet: Heart healthy/ADA          Plan:No further follow up required: the patient has met the plan of care and no further outreach is warranted by the [facility]. The patient has the [facility] number to call for changes, questions, or concerns.  Please call the care guide team at [phone] if you need to schedule an appointment.   If you are experiencing a Mental Health or [place] or need someone to talk to, please call the Suicide and Crisis Lifeline: 988 call the [place] National Suicide Prevention Lifeline: 404-864-8238 or TTY: 440-491-8858 TTY 864-249-8718) to talk to a trained counselor call 1-800-273-TALK (toll free, 24 hour hotline)   Patient verbalizes understanding of instructions and care plan provided today and agrees to view in [place]. Active MyChart status and patient understanding of how to access instructions and care plan via MyChart confirmed with patient.     Noreene Larsson RN, MSN, [facility] Mobile: [phone]

## 2022-02-02 ENCOUNTER — Other Ambulatory Visit: Payer: Self-pay

## 2022-02-02 ENCOUNTER — Emergency Department
Admission: EM | Admit: 2022-02-02 | Discharge: 2022-02-02 | Disposition: A | Discharge status: Home / Self Care | Payer: Medicare Other | Attending: Emergency Medicine | Admitting: Emergency Medicine

## 2022-02-02 ENCOUNTER — Encounter: Payer: Self-pay | Admitting: Emergency Medicine

## 2022-02-02 DIAGNOSIS — G8929 Other chronic pain: Secondary | ICD-10-CM

## 2022-02-02 DIAGNOSIS — I10 Essential (primary) hypertension: Secondary | ICD-10-CM | POA: Insufficient documentation

## 2022-02-02 DIAGNOSIS — E119 Type 2 diabetes mellitus without complications: Secondary | ICD-10-CM | POA: Diagnosis not present

## 2022-02-02 DIAGNOSIS — M542 Cervicalgia: Secondary | ICD-10-CM | POA: Diagnosis not present

## 2022-02-02 MED ORDER — ACETAMINOPHEN 325 MG PO TABS
650.0000 mg | ORAL_TABLET | Freq: Once | ORAL | Status: AC
  Administered 2022-02-02: 650 mg via ORAL
  Filled 2022-02-02: qty 2

## 2022-02-02 MED ORDER — CYCLOBENZAPRINE HCL 5 MG PO TABS: 5.0000 mg | ORAL_TABLET | Freq: Every day | ORAL | 0 refills | Status: AC

## 2022-02-02 MED ORDER — LIDOCAINE 5 % EX PTCH: 1.0000 | MEDICATED_PATCH | Freq: Two times a day (BID) | CUTANEOUS | 0 refills | Status: AC

## 2022-02-02 MED ORDER — CYCLOBENZAPRINE HCL 10 MG PO TABS
5.0000 mg | ORAL_TABLET | Freq: Once | ORAL | Status: AC
  Administered 2022-02-02: 5 mg via ORAL
  Filled 2022-02-02: qty 1

## 2022-02-02 MED ORDER — LIDOCAINE 5 % EX PTCH
1.0000 | MEDICATED_PATCH | CUTANEOUS | Status: DC
  Administered 2022-02-02: 1 via TRANSDERMAL
  Filled 2022-02-02: qty 1

## 2022-02-02 NOTE — ED Triage Notes (Signed)
Patient ambulatory to triage with steady gait, without difficulty or distress noted; pt reports receives injections at pain clinic and not due til next week; c/o "neck spasms"; taking PO pain meds rx without relief as well

## 2022-02-02 NOTE — Discharge Instructions (Signed)
You may take the medications as prescribed.  Please remember that the muscle relaxant can make you sleepy or impair your judgment, so do not operate heavy machinery, perform tasks that require concentration, or drive a car while taking this medication.  Please follow-up with your outpatient provider as scheduled.  Please return for any new, worsening, or change in symptoms or other concerns including worsening pain, pain that is different than your normal pain, headache, dizziness, numbness, tingling, weakness, or any other concerns.  It was a pleasure caring for you today.

## 2022-02-02 NOTE — ED Notes (Signed)
See triage note  Presents with some pain and spasms to neck  Pain is mainly to left side of neck  Has been unable ot sleep d/t pain

## 2022-02-02 NOTE — ED Provider Notes (Signed)
[facility] Provider Note    Event Date/Time   First MD Initiated Contact with Patient 02/02/22 [phone]     (approximate)   History   Torticollis   HPI  Ellen [name] is a 81 y.o. female with a past medical history of cervicalgia, obesity, hyperlipidemia, diabetes, hypertension who presents today for evaluation of neck pain.  Patient reports that she has had this pain since 2015 and her pain today feels exactly the same as it normally does.  She denies any injuries.  She reports that her pain is in her trapezius muscle and she feels that it is very tight.  She denies any radiation of pain down her arm.  She denies weakness or paresthesias in her arm.  She denies decreased grip strength.  She denies any headache or dizziness.  She is here because she would like a refill of her muscle relaxant.  Patient Active Problem List   Diagnosis Date Noted   Weight loss 03/09/2021   Obesity 05/31/2020   Cervicalgia 12/03/2019   Hyperlipidemia associated with type 2 diabetes mellitus (HCC) 11/08/2018   Atherosclerosis of aorta (HCC) 11/13/2017   Back pain 06/13/2017   Muscle soreness 05/16/2017   Advanced care planning/counseling discussion 04/17/2017   Hypertension associated with diabetes (HCC) 10/04/2015   Menopause 03/29/2015   Type 2 diabetes mellitus with proteinuria (HCC) 03/29/2015          Physical Exam   Triage Vital Signs: ED Triage Vitals  Enc Vitals Group     BP 02/02/22 0000 136/80     Pulse Rate 02/02/22 0000 82     Resp 02/02/22 0000 20     Temp 02/02/22 0000 98.6 F (37 C)     Temp Source 02/02/22 0000 Oral     SpO2 02/02/22 0000 99 %     Weight 02/02/22 0031 149 lb (67.6 kg)     Height 02/02/22 0031 5\' 3"  (1.6 m)     Head Circumference --      Peak Flow --      Pain Score 02/02/22 0031 10     Pain Loc --      Pain Edu? --      Excl. in GC? --     Most recent vital signs: Vitals:   02/02/22 0654 02/02/22 0816  BP: (!) 144/87  (!) 140/80  Pulse: 78 76  Resp: 20 18  Temp: (!) 97.5 F (36.4 C)   SpO2: 98% 98%    Physical Exam Vitals and nursing note reviewed.  Constitutional:      General: Awake and alert. No acute distress.    Appearance: Normal appearance. The patient is normal weight.  HENT:     Head: Normocephalic and atraumatic.     Mouth: Mucous membranes are moist.  Eyes:     General: PERRL. Normal EOMs        Right eye: No discharge.        Left eye: No discharge.     Conjunctiva/sclera: Conjunctivae normal.  Cardiovascular:     Rate and Rhythm: Normal rate and regular rhythm.     Pulses: Normal pulses.     Heart sounds: Normal heart sounds Pulmonary:     Effort: Pulmonary effort is normal. No respiratory distress.     Breath sounds: Normal breath sounds.  Abdominal:     Abdomen is soft. There is no abdominal tenderness. No rebound or guarding. No distention. Musculoskeletal:  General: No swelling. Normal range of motion.     Cervical back: Normal range of motion and neck supple.  No midline cervical spine tenderness.  Full range of motion of neck.  Negative Spurling test.  Negative Lhermitte sign.  Normal strength and sensation in bilateral upper extremities. Normal grip strength bilaterally.  Normal intrinsic muscle function of the hand bilaterally.  Normal radial pulses bilaterally. Muscle spasm to left trapezius. No skin changes.  Negative Hoffmann sign Skin:    General: Skin is warm and dry.     Capillary Refill: Capillary refill takes less than 2 seconds.     Findings: No rash.  Neurological:     Mental Status: The patient is awake and alert.      ED Results / Procedures / Treatments   Labs (all labs ordered are listed, but only abnormal results are displayed) Labs Reviewed - No data to display   EKG     RADIOLOGY Patient declined    PROCEDURES:  Critical Care performed:   Procedures   MEDICATIONS ORDERED IN ED: Medications  lidocaine (LIDODERM) 5 % 1  patch (1 patch Transdermal Patch Applied 02/02/22 0730)  cyclobenzaprine (FLEXERIL) tablet 5 mg (5 mg Oral Given 02/02/22 0730)  acetaminophen (TYLENOL) tablet 650 mg (650 mg Oral Given 02/02/22 0730)     IMPRESSION / MDM / ASSESSMENT AND PLAN / ED COURSE  I reviewed the triage vital signs and the nursing notes.   Differential diagnosis includes, but is not limited to, acute on chronic neck pain, osseous abnormality, cervical radiculopathy, muscle spasm.  Patient is awake and alert, hemodynamically stable and afebrile.  She has normal strength and sensation in bilateral upper extremities, no radicular symptoms.  She has no midline neck tenderness.  There is no trauma to suggest cervical fracture.  No headache, dizziness, vision changes, cranial nerve deficits to suggest vascular abnormality.  No headache or fever or neck stiffness to suggest meningitis.  She is nontoxic in appearance.  She reports that her symptoms today are exactly the same as they have been for the past 8 years and she does not want any further work-up.  She is requesting a muscle relaxant only.  She was treated symptomatically with Flexeril, Tylenol, and Lidoderm patch with significant improvement of her symptoms.  She is requesting Flexeril and Lidoderm patch for discharge.  Upon reevaluation she reports that she feels much better and would like to be discharged.  Still refusing any further work-up.  She has an appointment this week with her normal outpatient provider and agrees to go to this appointment.  We discussed return precautions and the importance of close outpatient follow-up.  Patient understands and agrees with plan.  She was discharged in stable condition.   Patient's presentation is most consistent with exacerbation of chronic illness.   Clinical Course as of 02/02/22 0818  Thu Feb 02, 2022  0812 Patient came out of her room to tell me that she is feeling much better and would like a prescription for the same meds  given to her today.  She is requesting to be discharged [JP]    Clinical Course User Index [JP] Andrew Blasius, Herb Grays, PA-C     FINAL CLINICAL IMPRESSION(S) / ED DIAGNOSES   Final diagnoses:  Neck pain, chronic     Rx / DC Orders   ED Discharge Orders          Ordered    cyclobenzaprine (FLEXERIL) 5 MG tablet  Daily at bedtime  02/02/22 0812    lidocaine (LIDODERM) 5 %  Every 12 hours        02/02/22 8588             Note:  This document was prepared using Dragon voice recognition software and may include unintentional dictation errors.   Ellen [name] 02/02/22 0818    Georga Hacking, MD 02/02/22 1053

## 2022-02-09 ENCOUNTER — Other Ambulatory Visit: Payer: Self-pay

## 2022-02-09 ENCOUNTER — Encounter: Payer: Self-pay | Admitting: Anesthesiology

## 2022-02-09 ENCOUNTER — Ambulatory Visit: Payer: Medicare Other | Attending: Anesthesiology | Admitting: Anesthesiology

## 2022-02-09 VITALS — BP 149/89 | HR 102 | Temp 96.6°F | Resp 16 | Ht 62.0 in | Wt 149.0 lb

## 2022-02-09 DIAGNOSIS — M5136 Other intervertebral disc degeneration, lumbar region: Secondary | ICD-10-CM | POA: Diagnosis not present

## 2022-02-09 DIAGNOSIS — M542 Cervicalgia: Secondary | ICD-10-CM | POA: Diagnosis not present

## 2022-02-09 DIAGNOSIS — M62838 Other muscle spasm: Secondary | ICD-10-CM | POA: Diagnosis not present

## 2022-02-09 DIAGNOSIS — M48062 Spinal stenosis, lumbar region with neurogenic claudication: Secondary | ICD-10-CM | POA: Insufficient documentation

## 2022-02-09 MED ORDER — ROPIVACAINE HCL 2 MG/ML IJ SOLN
INTRAMUSCULAR | Status: AC
  Filled 2022-02-09: qty 20

## 2022-02-09 MED ORDER — DEXAMETHASONE SODIUM PHOSPHATE 10 MG/ML IJ SOLN
INTRAMUSCULAR | Status: AC
  Filled 2022-02-09: qty 1

## 2022-02-09 MED ORDER — DEXAMETHASONE SODIUM PHOSPHATE 10 MG/ML IJ SOLN
10.0000 mg | Freq: Once | INTRAMUSCULAR | Status: AC
  Administered 2022-02-09: 10 mg

## 2022-02-09 MED ORDER — CYCLOBENZAPRINE HCL 10 MG PO TABS: 10.0000 mg | ORAL_TABLET | Freq: Two times a day (BID) | ORAL | 3 refills | Status: DC

## 2022-02-09 MED ORDER — ROPIVACAINE HCL 2 MG/ML IJ SOLN: 10.0000 mL | Freq: Once | INTRAMUSCULAR | Status: DC

## 2022-02-09 NOTE — Patient Instructions (Signed)

## 2022-02-09 NOTE — Progress Notes (Signed)
Safety precautions to be maintained throughout the outpatient stay will include: orient to surroundings, keep bed in low position, maintain call bell within reach at all times, provide assistance with transfer out of bed and ambulation.  

## 2022-02-09 NOTE — Progress Notes (Signed)
Subjective:  Patient ID: Ellen [name], female    DOB: 03/08/1941  Age: 81 y.o. MRN: [id number]  CC: Neck Pain (Bilateral )   Procedure: Bilateral trigger point injections to the trapezius muscle.  HPI Ellen [name] presents for reevaluation.  Unfortunately she is still having a lot of pain in her low back but more so today in the neck region.  She gets a neck pain that she describes as a gnawing aching pain radiating from the central neck into the bilateral shoulders.  She does not experience any pain radiating into the arms this does not go past her shoulders.  No description of numbness or tingling or motor weakness is noted in the upper extremities.  The pain is primarily just in the trapezius musculature and central neck of a gnawing aching pain worse with certain activities and exercises.  This been a chronic problem for her.  In regards to her low back pain this has been stable periodically flares but generally controlled with medication management.  She is recently taken Flexeril at bedtime to help with some of the muscle spasms and this works well for her.  She did fail tizanidine.  Otherwise she is in her usual state of health.  Outpatient Medications Prior to Visit  Medication Sig Dispense Refill   amLODipine (NORVASC) 10 MG tablet Take 1 tablet (10 mg total) by mouth daily. 90 tablet 1   aspirin 81 MG tablet Take 81 mg by mouth daily.     atorvastatin (LIPITOR) 20 MG tablet Take 1 tablet (20 mg total) by mouth daily. 90 tablet 4   blood glucose meter kit and supplies KIT Dispense based on patient and insurance preference. Use up to four times daily as directed. (FOR ICD-9 250.00, 250.01). 1 each 0   CALCIUM-MAGNESIUM-ZINC PO Take 1 tablet by mouth daily.     calcium-vitamin D 250-100 MG-UNIT tablet Take 1 tablet by mouth 2 (two) times daily.     chlorthalidone (HYGROTON) 25 MG tablet Take 1 tablet (25 mg total) by mouth daily. 90 tablet 0   Cholecalciferol (VITAMIN D3) 1000  units CAPS Take 2 capsules by mouth daily.      lisinopril (ZESTRIL) 40 MG tablet Take 1 tablet (40 mg total) by mouth daily. 90 tablet 1   Magnesium 500 MG TABS Take 1 tablet by mouth daily.     metFORMIN (GLUCOPHAGE) 500 MG tablet Start out taking 1 tablet (500 MG) by mouth every morning with meal, then on week two start taking 1 tablet (500 MG) by mouth in morning with meal and 1 tablet (500 MG) in evening with meal. 180 tablet 3   vitamin C (ASCORBIC ACID) 500 MG tablet Take 500 mg by mouth daily.     cyclobenzaprine (FLEXERIL) 5 MG tablet Take 1 tablet (5 mg total) by mouth at bedtime for 7 days. (Patient not taking: Reported on 02/09/2022) 7 tablet 0   tiZANidine (ZANAFLEX) 2 MG tablet Take 1 tablet (2 mg total) by mouth every 8 (eight) hours as needed for muscle spasms. (Patient not taking: Reported on 02/09/2022) 30 tablet 0   No facility-administered medications prior to visit.    Review of Systems CNS: No confusion or sedation Cardiac: No angina or palpitations GI: No abdominal pain or constipation Constitutional: No nausea vomiting fevers or chills  Objective:  BP (!) 149/89 (BP Location: Left Arm, Patient Position: Sitting, Cuff Size: Normal)   Pulse (!) 102   Temp (!) 96.6 F (35.9 C) (  Temporal)   Resp 16   Ht _0  (1.575 m)   Wt 149 lb (67.6 kg)   LMP 11/28/1994 (Approximate)   SpO2 100%   BMI 27.25 kg/m    BP Readings from Last 3 Encounters:  02/09/22 (!) 149/89  02/02/22 (!) 140/80  01/18/22 (!) 146/86     Wt Readings from Last 3 Encounters:  02/09/22 149 lb (67.6 kg)  02/02/22 149 lb (67.6 kg)  01/18/22 149 lb 6.4 oz (67.8 kg)     Physical Exam Pt is alert and oriented PERRL EOMI HEART IS RRR no murmur or rub LCTA no wheezing or rales MUSCULOSKELETAL 2 trigger points are noted in the bilateral trapezius musculature.  Good strength to the upper extremities some pain on atlantooccipital flexion extension.  Labs  Lab Results  Component Value Date    HGBA1C 6.0 (H) 12/08/2021   HGBA1C 5.7 (H) 09/06/2021   HGBA1C 6.2 (H) 06/08/2021   Lab Results  Component Value Date   MICROALBUR 10 02/23/2021   LDLCALC 57 12/08/2021   CREATININE 0.62 12/08/2021    -------------------------------------------------------------------------------------------------------------------- Lab Results  Component Value Date   WBC 5.2 12/08/2021   HGB 11.7 12/08/2021   HCT 37.2 12/08/2021   PLT 267 12/08/2021   GLUCOSE 98 12/08/2021   CHOL 140 12/08/2021   TRIG 95 12/08/2021   HDL 65 12/08/2021   LDLCALC 57 12/08/2021   ALT 11 12/08/2021   AST 20 12/08/2021   NA 141 12/08/2021   K 3.5 12/08/2021   CL 102 12/08/2021   CREATININE 0.62 12/08/2021   BUN 14 12/08/2021   CO2 24 12/08/2021   TSH 1.620 02/23/2021   HGBA1C 6.0 (H) 12/08/2021   MICROALBUR 10 02/23/2021    --------------------------------------------------------------------------------------------------------------------- No results found.   Assessment & Plan:   Caroleen was seen today for neck pain.  Diagnoses and all orders for this visit:  Spinal stenosis of lumbar region with neurogenic claudication  DDD (degenerative disc disease), lumbar  Cervicalgia -     dexamethasone (DECADRON) injection 10 mg -     ropivacaine (PF) 2 mg/mL (0.2%) (NAROPIN) injection 10 mL -     INJECT TRIGGER POINT, 1 OR 2  Muscle spasm of left shoulder -     dexamethasone (DECADRON) injection 10 mg -     ropivacaine (PF) 2 mg/mL (0.2%) (NAROPIN) injection 10 mL -     INJECT TRIGGER POINT, 1 OR 2  Other orders -     cyclobenzaprine (FLEXERIL) 10 MG tablet; Take 1 tablet (10 mg total) by mouth in the morning and at bedtime.        ----------------------------------------------------------------------------------------------------------------------  Problem List Items Addressed This Visit       Unprioritized   Cervicalgia   Relevant Medications   ropivacaine (PF) 2 mg/mL (0.2%) (NAROPIN)  injection 10 mL   Other Relevant Orders   INJECT TRIGGER POINT, 1 OR 2   Muscle spasm of left shoulder   Relevant Medications   ropivacaine (PF) 2 mg/mL (0.2%) (NAROPIN) injection 10 mL   Other Relevant Orders   INJECT TRIGGER POINT, 1 OR 2   Other Visit Diagnoses     Spinal stenosis of lumbar region with neurogenic claudication    -  Primary   Relevant Medications   dexamethasone (DECADRON) injection 10 mg (Completed)   ropivacaine (PF) 2 mg/mL (0.2%) (NAROPIN) injection 10 mL   cyclobenzaprine (FLEXERIL) 10 MG tablet   DDD (degenerative disc disease), lumbar       Relevant  Medications   dexamethasone (DECADRON) injection 10 mg (Completed)   cyclobenzaprine (FLEXERIL) 10 MG tablet         ----------------------------------------------------------------------------------------------------------------------  1. Spinal stenosis of lumbar region with neurogenic claudication Continue with stretching strengthening exercises and core strengthening.  2. DDD (degenerative disc disease), lumbar As above  3. Cervicalgia We will plan on a trigger point injection for the 2 trigger points noted on examination today.  Of gone over the risks and benefits of this with her in full detail.  We may need to get further studies on her neck for radiographic determination for possible cervical facet disease.  We will schedule her for return to clinic in 1 month with repeat injection contingent on her response to therapy today.  Continue with core stretching exercises orthotic pillow as reviewed - dexamethasone (DECADRON) injection 10 mg - ropivacaine (PF) 2 mg/mL (0.2%) (NAROPIN) injection 10 mL - INJECT TRIGGER POINT, 1 OR 2  4. Muscle spasm of left shoulder As above - dexamethasone (DECADRON) injection 10 mg - ropivacaine (PF) 2 mg/mL (0.2%) (NAROPIN) injection 10 mL - INJECT TRIGGER POINT, 1 OR  2    ----------------------------------------------------------------------------------------------------------------------  I am having Lindsi E. Serafin start on cyclobenzaprine. I am also having her maintain her aspirin, Vitamin D3, Magnesium, calcium-vitamin D, ascorbic acid, CALCIUM-MAGNESIUM-ZINC PO, blood glucose meter kit and supplies, metFORMIN, atorvastatin, amLODipine, lisinopril, chlorthalidone, tiZANidine, and cyclobenzaprine. We administered dexamethasone.   Meds ordered this encounter  Medications   dexamethasone (DECADRON) injection 10 mg   ropivacaine (PF) 2 mg/mL (0.2%) (NAROPIN) injection 10 mL   cyclobenzaprine (FLEXERIL) 10 MG tablet    Sig: Take 1 tablet (10 mg total) by mouth in the morning and at bedtime.    Dispense:  60 tablet    Refill:  3   Patient's Medications  New Prescriptions   CYCLOBENZAPRINE (FLEXERIL) 10 MG TABLET    Take 1 tablet (10 mg total) by mouth in the morning and at bedtime.  Previous Medications   AMLODIPINE (NORVASC) 10 MG TABLET    Take 1 tablet (10 mg total) by mouth daily.   ASPIRIN 81 MG TABLET    Take 81 mg by mouth daily.   ATORVASTATIN (LIPITOR) 20 MG TABLET    Take 1 tablet (20 mg total) by mouth daily.   BLOOD GLUCOSE METER KIT AND SUPPLIES KIT    Dispense based on patient and insurance preference. Use up to four times daily as directed. (FOR ICD-9 250.00, 250.01).   CALCIUM-MAGNESIUM-ZINC PO    Take 1 tablet by mouth daily.   CALCIUM-VITAMIN D 250-100 MG-UNIT TABLET    Take 1 tablet by mouth 2 (two) times daily.   CHLORTHALIDONE (HYGROTON) 25 MG TABLET    Take 1 tablet (25 mg total) by mouth daily.   CHOLECALCIFEROL (VITAMIN D3) 1000 UNITS CAPS    Take 2 capsules by mouth daily.    CYCLOBENZAPRINE (FLEXERIL) 5 MG TABLET    Take 1 tablet (5 mg total) by mouth at bedtime for 7 days.   LISINOPRIL (ZESTRIL) 40 MG TABLET    Take 1 tablet (40 mg total) by mouth daily.   MAGNESIUM 500 MG TABS    Take 1 tablet by mouth daily.    METFORMIN (GLUCOPHAGE) 500 MG TABLET    Start out taking 1 tablet (500 MG) by mouth every morning with meal, then on week two start taking 1 tablet (500 MG) by mouth in morning with meal and 1 tablet (500 MG) in evening with meal.  TIZANIDINE (ZANAFLEX) 2 MG TABLET    Take 1 tablet (2 mg total) by mouth every 8 (eight) hours as needed for muscle spasms.   VITAMIN C (ASCORBIC ACID) 500 MG TABLET    Take 500 mg by mouth daily.  Modified Medications   No medications on file  Discontinued Medications   No medications on file   ----------------------------------------------------------------------------------------------------------------------  Follow-up: Return in about 1 month (around 03/12/2022) for evaluation, procedure.  Trigger point injection: The area overlying the aforementioned trigger points were prepped with alcohol. They were then injected with a 25-gauge needle with 4 cc of ropivacaine 0.2% and Decadron 4 mg at each site after negative aspiration for heme. This was performed after informed consent was obtained and risks and benefits reviewed. She tolerated this procedure without difficulty and was convalesced and discharged to home in stable condition for follow-up as mentioned.  _0  Andree Elk, MD@   Molli Barrows, MD

## 2022-02-16 DIAGNOSIS — E1129 Type 2 diabetes mellitus with other diabetic kidney complication: Secondary | ICD-10-CM

## 2022-02-16 DIAGNOSIS — R809 Proteinuria, unspecified: Secondary | ICD-10-CM

## 2022-02-16 DIAGNOSIS — I152 Hypertension secondary to endocrine disorders: Secondary | ICD-10-CM

## 2022-02-16 DIAGNOSIS — E785 Hyperlipidemia, unspecified: Secondary | ICD-10-CM

## 2022-02-16 DIAGNOSIS — E1159 Type 2 diabetes mellitus with other circulatory complications: Secondary | ICD-10-CM

## 2022-02-16 DIAGNOSIS — E1169 Type 2 diabetes mellitus with other specified complication: Secondary | ICD-10-CM

## 2022-02-22 ENCOUNTER — Encounter: Payer: Self-pay | Admitting: Nurse Practitioner

## 2022-02-22 ENCOUNTER — Ambulatory Visit (INDEPENDENT_AMBULATORY_CARE_PROVIDER_SITE_OTHER): Payer: Medicare Other | Admitting: Nurse Practitioner

## 2022-02-22 DIAGNOSIS — E1159 Type 2 diabetes mellitus with other circulatory complications: Secondary | ICD-10-CM | POA: Diagnosis not present

## 2022-02-22 DIAGNOSIS — I152 Hypertension secondary to endocrine disorders: Secondary | ICD-10-CM | POA: Diagnosis not present

## 2022-02-22 NOTE — Assessment & Plan Note (Signed)
Chronic.  Controlled.  Continue with current medication regimen of Lisinopril, hydralazine and Amlodipine.  Return to clinic in 3 months for reevaluation.  Call sooner if concerns arise.   

## 2022-02-22 NOTE — Progress Notes (Signed)
BP 137/83   Pulse (!) 113   Temp 97.7 F (36.5 C) (Oral)   Wt 150 lb 9.6 oz (68.3 kg)   LMP 11/28/1994 (Approximate)   SpO2 97%   BMI 27.55 kg/m    Subjective:    Patient ID: Ellen [name], female    DOB: 1941/05/02, 81 y.o.   MRN: [id number]  HPI: Ellen [name] is a 81 y.o. female  Chief Complaint  Patient presents with   Hypertension    1 month follow up    HYPERTENSION Hypertension status: controlled Satisfied with current treatment? yes Duration of hypertension: years BP monitoring frequency:  daily BP range: 130/80 BP medication side effects:  no Medication compliance: excellent compliance Previous BP meds:amlodipine and lisinopril, hydralazine Aspirin: no Recurrent headaches: no Visual changes: no Palpitations: no Dyspnea: no Chest pain: no Lower extremity edema: no Dizzy/lightheaded: no     Relevant past medical, surgical, family and social history reviewed and updated as indicated. Interim medical history since our last visit reviewed. Allergies and medications reviewed and updated.  Review of Systems  Eyes:  Negative for visual disturbance.  Respiratory:  Negative for cough, chest tightness and shortness of breath.   Cardiovascular:  Negative for chest pain, palpitations and leg swelling.  Genitourinary:        Bladder leakage  Neurological:  Negative for dizziness and headaches.    Per HPI unless specifically indicated above     Objective:    BP 137/83   Pulse (!) 113   Temp 97.7 F (36.5 C) (Oral)   Wt 150 lb 9.6 oz (68.3 kg)   LMP 11/28/1994 (Approximate)   SpO2 97%   BMI 27.55 kg/m   Wt Readings from Last 3 Encounters:  02/22/22 150 lb 9.6 oz (68.3 kg)  02/09/22 149 lb (67.6 kg)  02/02/22 149 lb (67.6 kg)    Physical Exam Vitals and nursing note reviewed.  Constitutional:      General: She is not in acute distress.    Appearance: Normal appearance. She is normal weight. She is not ill-appearing, toxic-appearing or  diaphoretic.  HENT:     Head: Normocephalic.     Right Ear: External ear normal.     Left Ear: External ear normal.     Nose: Nose normal.     Mouth/Throat:     Mouth: Mucous membranes are moist.     Pharynx: Oropharynx is clear.  Eyes:     General:        Right eye: No discharge.        Left eye: No discharge.     Extraocular Movements: Extraocular movements intact.     Conjunctiva/sclera: Conjunctivae normal.     Pupils: Pupils are equal, round, and reactive to light.  Cardiovascular:     Rate and Rhythm: Normal rate and regular rhythm.     Heart sounds: No murmur heard. Pulmonary:     Effort: Pulmonary effort is normal. No respiratory distress.     Breath sounds: Normal breath sounds. No wheezing or rales.  Musculoskeletal:     Cervical back: Normal range of motion and neck supple.  Skin:    General: Skin is warm and dry.     Capillary Refill: Capillary refill takes less than 2 seconds.  Neurological:     General: No focal deficit present.     Mental Status: She is alert and oriented to person, place, and time. Mental status is at baseline.  Psychiatric:  Mood and Affect: Mood normal.        Behavior: Behavior normal.        Thought Content: Thought content normal.        Judgment: Judgment normal.     Results for orders placed or performed in visit on 12/07/21  HgB A1c  Result Value Ref Range   Hgb A1c MFr Bld 6.0 (H) 4.8 - 5.6 %   Est. average glucose Bld gHb Est-mCnc 126 mg/dL  Lipid Profile  Result Value Ref Range   Cholesterol, Total 140 100 - 199 mg/dL   Triglycerides 95 0 - 149 mg/dL   HDL 65 >39 mg/dL   VLDL Cholesterol Cal 18 5 - 40 mg/dL   LDL Chol Calc (NIH) 57 0 - 99 mg/dL   Chol/HDL Ratio 2.2 0.0 - 4.4 ratio  Comp Met (CMET)  Result Value Ref Range   Glucose 98 70 - 99 mg/dL   BUN 14 8 - 27 mg/dL   Creatinine, Ser 0.62 0.57 - 1.00 mg/dL   eGFR 91 >59 mL/min/1.73   BUN/Creatinine Ratio 23 12 - 28   Sodium 141 134 - 144 mmol/L   Potassium  3.5 3.5 - 5.2 mmol/L   Chloride 102 96 - 106 mmol/L   CO2 24 20 - 29 mmol/L   Calcium 9.6 8.7 - 10.3 mg/dL   Total Protein 6.7 6.0 - 8.5 g/dL   Albumin 4.2 3.7 - 4.7 g/dL   Globulin, Total 2.5 1.5 - 4.5 g/dL   Albumin/Globulin Ratio 1.7 1.2 - 2.2   Bilirubin Total 0.2 0.0 - 1.2 mg/dL   Alkaline Phosphatase 111 44 - 121 IU/L   AST 20 0 - 40 IU/L   ALT 11 0 - 32 IU/L  CBC w/Diff  Result Value Ref Range   WBC 5.2 3.4 - 10.8 x10E3/uL   RBC 4.22 3.77 - 5.28 x10E6/uL   Hemoglobin 11.7 11.1 - 15.9 g/dL   Hematocrit 37.2 34.0 - 46.6 %   MCV 88 79 - 97 fL   MCH 27.7 26.6 - 33.0 pg   MCHC 31.5 31.5 - 35.7 g/dL   RDW 13.9 11.7 - 15.4 %   Platelets 267 150 - 450 x10E3/uL   Neutrophils 60 Not Estab. %   Lymphs 27 Not Estab. %   Monocytes 8 Not Estab. %   Eos 4 Not Estab. %   Basos 1 Not Estab. %   Neutrophils Absolute 3.1 1.4 - 7.0 x10E3/uL   Lymphocytes Absolute 1.4 0.7 - 3.1 x10E3/uL   Monocytes Absolute 0.4 0.1 - 0.9 x10E3/uL   EOS (ABSOLUTE) 0.2 0.0 - 0.4 x10E3/uL   Basophils Absolute 0.1 0.0 - 0.2 x10E3/uL   Immature Granulocytes 0 Not Estab. %   Immature Grans (Abs) 0.0 0.0 - 0.1 x10E3/uL  Urine Microalbumin w/creat. ratio  Result Value Ref Range   Creatinine, Urine 33.5 Not Estab. mg/dL   Microalbumin, Urine 13.0 Not Estab. ug/mL   Microalb/Creat Ratio 39 (H) 0 - 29 mg/g creat  B12  Result Value Ref Range   Vitamin B-12 >2000 (H) 232 - 1245 pg/mL      Assessment & Plan:   Problem List Items Addressed This Visit       Cardiovascular and Mediastinum   Hypertension associated with diabetes (HCC)    Chronic.  Controlled.  Continue with current medication regimen of Lisinopril, hydralazine and Amlodipine.  Return to clinic in 3 months for reevaluation.  Call sooner if concerns arise.            Follow up plan: Return in about 3 months (around 05/24/2022) for HTN, HLD, DM2 FU.

## 2022-03-15 ENCOUNTER — Ambulatory Visit: Payer: Medicare Other | Attending: Anesthesiology | Admitting: Anesthesiology

## 2022-03-15 ENCOUNTER — Encounter: Payer: Self-pay | Admitting: Anesthesiology

## 2022-03-15 VITALS — BP 136/72 | HR 95 | Temp 98.2°F | Resp 16 | Ht 62.0 in | Wt 150.0 lb

## 2022-03-15 DIAGNOSIS — M4716 Other spondylosis with myelopathy, lumbar region: Secondary | ICD-10-CM | POA: Insufficient documentation

## 2022-03-15 DIAGNOSIS — M549 Dorsalgia, unspecified: Secondary | ICD-10-CM | POA: Diagnosis not present

## 2022-03-15 DIAGNOSIS — G8929 Other chronic pain: Secondary | ICD-10-CM

## 2022-03-15 DIAGNOSIS — M62838 Other muscle spasm: Secondary | ICD-10-CM | POA: Insufficient documentation

## 2022-03-15 DIAGNOSIS — M545 Low back pain, unspecified: Secondary | ICD-10-CM

## 2022-03-15 DIAGNOSIS — M791 Myalgia, unspecified site: Secondary | ICD-10-CM

## 2022-03-15 DIAGNOSIS — M542 Cervicalgia: Secondary | ICD-10-CM

## 2022-03-15 DIAGNOSIS — M5431 Sciatica, right side: Secondary | ICD-10-CM

## 2022-03-15 DIAGNOSIS — M48062 Spinal stenosis, lumbar region with neurogenic claudication: Secondary | ICD-10-CM | POA: Diagnosis not present

## 2022-03-15 DIAGNOSIS — M5136 Other intervertebral disc degeneration, lumbar region: Secondary | ICD-10-CM | POA: Insufficient documentation

## 2022-03-15 MED ORDER — ROPIVACAINE HCL 2 MG/ML IJ SOLN
10.0000 mL | Freq: Once | INTRAMUSCULAR | Status: AC
  Administered 2022-03-15: 10 mL via EPIDURAL

## 2022-03-15 MED ORDER — DEXAMETHASONE SODIUM PHOSPHATE 10 MG/ML IJ SOLN
INTRAMUSCULAR | Status: AC
  Filled 2022-03-15: qty 1

## 2022-03-15 MED ORDER — ROPIVACAINE HCL 2 MG/ML IJ SOLN
INTRAMUSCULAR | Status: AC
  Filled 2022-03-15: qty 20

## 2022-03-15 MED ORDER — DEXAMETHASONE SODIUM PHOSPHATE 10 MG/ML IJ SOLN
10.0000 mg | Freq: Once | INTRAMUSCULAR | Status: AC
  Administered 2022-03-15: 10 mg

## 2022-03-15 NOTE — Progress Notes (Signed)
Nursing Pain Medication Assessment:  Safety precautions to be maintained throughout the outpatient stay will include: orient to surroundings, keep bed in low position, maintain call bell within reach at all times, provide assistance with transfer out of bed and ambulation.  Medication Inspection Compliance: Ms. Reels did not comply with our request to bring her pills to be counted. She was reminded that bringing the medication bottles, even when empty, is a requirement.  Medication: None brought in. Pill/Patch Count: None available to be counted. Bottle Appearance: No container available. Did not bring bottle(s) to appointment. Filled Date: N/A Last Medication intake:  Today 

## 2022-03-21 NOTE — Progress Notes (Signed)
Subjective:  Patient ID: Ellen [name], female    DOB: [birth year]  Age: 81 y.o. MRN: [id number]  CC: Neck Pain (right)   Procedure: Bilateral trapezius muscle injection  HPI Ellen [name] presents for reevaluation.  Ellen [name] had trigger point injections few weeks ago and presents today with improved symptoms.  Ellen [name] feels that her pain has improved by about 50% in the neck and shoulder region.  Ellen [name] is able to be slightly more active and trying to do her stretching strengthening exercises and otherwise appears to be making some progress.  No upper extremity weakness is reported and otherwise Ellen [name] is in her usual state of health.  Outpatient Medications Prior to Visit  Medication Sig Dispense Refill   amLODipine (NORVASC) 10 MG tablet Take 1 tablet (10 mg total) by mouth daily. 90 tablet 1   aspirin 81 MG tablet Take 81 mg by mouth daily.     atorvastatin (LIPITOR) 20 MG tablet Take 1 tablet (20 mg total) by mouth daily. 90 tablet 4   blood glucose meter kit and supplies KIT Dispense based on patient and insurance preference. Use up to four times daily as directed. (FOR ICD-9 250.00, 250.01). 1 each 0   CALCIUM-MAGNESIUM-ZINC PO Take 1 tablet by mouth daily.     calcium-vitamin D 250-100 MG-UNIT tablet Take 1 tablet by mouth 2 (two) times daily.     chlorthalidone (HYGROTON) 25 MG tablet Take 1 tablet (25 mg total) by mouth daily. 90 tablet 0   Cholecalciferol (VITAMIN D3) 1000 units CAPS Take 2 capsules by mouth daily.      cyclobenzaprine (FLEXERIL) 10 MG tablet Take 1 tablet (10 mg total) by mouth in the morning and at bedtime. 60 tablet 3   lisinopril (ZESTRIL) 40 MG tablet Take 1 tablet (40 mg total) by mouth daily. 90 tablet 1   Magnesium 500 MG TABS Take 1 tablet by mouth daily.     metFORMIN (GLUCOPHAGE) 500 MG tablet Start out taking 1 tablet (500 MG) by mouth every morning with meal, then on week two start taking 1 tablet (500 MG) by mouth in morning with meal and 1 tablet (500  MG) in evening with meal. 180 tablet 3   vitamin C (ASCORBIC ACID) 500 MG tablet Take 500 mg by mouth daily.     No facility-administered medications prior to visit.    Review of Systems CNS: No confusion or sedation Cardiac: No angina or palpitations GI: No abdominal pain or constipation Constitutional: No nausea vomiting fevers or chills  Objective:  BP 136/72   Pulse 95   Temp 98.2 F (36.8 C)   Resp 16   Ht '5\' 2"'  (1.575 m)   Wt 150 lb (68 kg)   LMP 11/28/1994 (Approximate)   SpO2 100%   BMI 27.44 kg/m    BP Readings from Last 3 Encounters:  03/15/22 136/72  02/22/22 137/83  02/09/22 (!) 149/89     Wt Readings from Last 3 Encounters:  03/15/22 150 lb (68 kg)  02/22/22 150 lb 9.6 oz (68.3 kg)  02/09/22 149 lb (67.6 kg)     Physical Exam Pt is alert and oriented PERRL EOMI HEART IS RRR no murmur or rub LCTA no wheezing or rales MUSCULOSKELETAL reveals less tenderness in the bilateral mid body trapezius muscle than before.  Ellen [name] has good range of motion at the atlantooccipital joint.  Upper extremity strength is at baseline.  Labs  Lab Results  Component Value Date   HGBA1C 6.0 (  H) 12/08/2021   HGBA1C 5.7 (H) 09/06/2021   HGBA1C 6.2 (H) 06/08/2021   Lab Results  Component Value Date   MICROALBUR 10 02/23/2021   LDLCALC 57 12/08/2021   CREATININE 0.62 12/08/2021    -------------------------------------------------------------------------------------------------------------------- Lab Results  Component Value Date   WBC 5.2 12/08/2021   HGB 11.7 12/08/2021   HCT 37.2 12/08/2021   PLT 267 12/08/2021   GLUCOSE 98 12/08/2021   CHOL 140 12/08/2021   TRIG 95 12/08/2021   HDL 65 12/08/2021   LDLCALC 57 12/08/2021   ALT 11 12/08/2021   AST 20 12/08/2021   NA 141 12/08/2021   K 3.5 12/08/2021   CL 102 12/08/2021   CREATININE 0.62 12/08/2021   BUN 14 12/08/2021   CO2 24 12/08/2021   TSH 1.620 02/23/2021   HGBA1C 6.0 (H) 12/08/2021   MICROALBUR  10 02/23/2021    --------------------------------------------------------------------------------------------------------------------- No results found.   Assessment & Plan:   Ellen [name] was seen today for neck pain.  Diagnoses and all orders for this visit:  Spinal stenosis of lumbar region with neurogenic claudication  DDD (degenerative disc disease), lumbar  Cervicalgia -     dexamethasone (DECADRON) injection 10 mg -     ropivacaine (PF) 2 mg/mL (0.2%) (NAROPIN) injection 10 mL -     INJECT TRIGGER POINT, 1 OR 2  Muscle spasm of left shoulder  Sciatica of right side  Spondylosis, lumbar, with myelopathy  Chronic bilateral low back pain without sciatica  Mid back pain on right side  Muscle soreness        ----------------------------------------------------------------------------------------------------------------------  Problem List Items Addressed This Visit       Unprioritized   Back pain   Cervicalgia   Relevant Orders   INJECT TRIGGER POINT, 1 OR 2   Muscle soreness   Muscle spasm of left shoulder   Other Visit Diagnoses     Spinal stenosis of lumbar region with neurogenic claudication    -  Primary   Relevant Medications   dexamethasone (DECADRON) injection 10 mg (Completed)   ropivacaine (PF) 2 mg/mL (0.2%) (NAROPIN) injection 10 mL (Completed)   DDD (degenerative disc disease), lumbar       Relevant Medications   dexamethasone (DECADRON) injection 10 mg (Completed)   Sciatica of right side       Spondylosis, lumbar, with myelopathy       Relevant Medications   dexamethasone (DECADRON) injection 10 mg (Completed)   Mid back pain on right side       Relevant Medications   dexamethasone (DECADRON) injection 10 mg (Completed)         ----------------------------------------------------------------------------------------------------------------------  1. Spinal stenosis of lumbar region with neurogenic claudication Continue core  stretching strengthening  2. DDD (degenerative disc disease), lumbar As above  3. Cervicalgia We will proceed with repeat trigger point injections today. - dexamethasone (DECADRON) injection 10 mg - ropivacaine (PF) 2 mg/mL (0.2%) (NAROPIN) injection 10 mL - INJECT TRIGGER POINT, 1 OR 2  4. Muscle spasm of left shoulder As above  5. Sciatica of right side   6. Spondylosis, lumbar, with myelopathy   7. Chronic bilateral low back pain without sciatica   8. Mid back pain on right side   9. Muscle soreness     ----------------------------------------------------------------------------------------------------------------------  I am having Ellen [name] maintain her aspirin, Vitamin D3, Magnesium, calcium-vitamin D, ascorbic acid, CALCIUM-MAGNESIUM-ZINC PO, blood glucose meter kit and supplies, metFORMIN, atorvastatin, amLODipine, lisinopril, chlorthalidone, and cyclobenzaprine. We administered dexamethasone and ropivacaine (PF) 2  mg/mL (0.2%).   Meds ordered this encounter  Medications   dexamethasone (DECADRON) injection 10 mg   ropivacaine (PF) 2 mg/mL (0.2%) (NAROPIN) injection 10 mL   Patient's Medications  New Prescriptions   No medications on file  Previous Medications   AMLODIPINE (NORVASC) 10 MG TABLET    Take 1 tablet (10 mg total) by mouth daily.   ASPIRIN 81 MG TABLET    Take 81 mg by mouth daily.   ATORVASTATIN (LIPITOR) 20 MG TABLET    Take 1 tablet (20 mg total) by mouth daily.   BLOOD GLUCOSE METER KIT AND SUPPLIES KIT    Dispense based on patient and insurance preference. Use up to four times daily as directed. (FOR ICD-9 250.00, 250.01).   CALCIUM-MAGNESIUM-ZINC PO    Take 1 tablet by mouth daily.   CALCIUM-VITAMIN D 250-100 MG-UNIT TABLET    Take 1 tablet by mouth 2 (two) times daily.   CHLORTHALIDONE (HYGROTON) 25 MG TABLET    Take 1 tablet (25 mg total) by mouth daily.   CHOLECALCIFEROL (VITAMIN D3) 1000 UNITS CAPS    Take 2 capsules by mouth  daily.    CYCLOBENZAPRINE (FLEXERIL) 10 MG TABLET    Take 1 tablet (10 mg total) by mouth in the morning and at bedtime.   LISINOPRIL (ZESTRIL) 40 MG TABLET    Take 1 tablet (40 mg total) by mouth daily.   MAGNESIUM 500 MG TABS    Take 1 tablet by mouth daily.   METFORMIN (GLUCOPHAGE) 500 MG TABLET    Start out taking 1 tablet (500 MG) by mouth every morning with meal, then on week two start taking 1 tablet (500 MG) by mouth in morning with meal and 1 tablet (500 MG) in evening with meal.   VITAMIN C (ASCORBIC ACID) 500 MG TABLET    Take 500 mg by mouth daily.  Modified Medications   No medications on file  Discontinued Medications   No medications on file   ----------------------------------------------------------------------------------------------------------------------  Follow-up: Return in about 1 month (around 04/14/2022) for evaluation, med refill, procedure.  Trigger point injection: The area overlying the aforementioned trigger points were prepped with alcohol. They were then injected with a 25-gauge needle with 4 cc of ropivacaine 0.2% and Decadron 4 mg at each site after negative aspiration for heme. This was performed after informed consent was obtained and risks and benefits reviewed. Ellen [name] tolerated this procedure without difficulty and was convalesced and discharged to home in stable condition for follow-up as mentioned.  '@Danylle Ouk'  Naveya Ellerman, MD@   Molli Barrows, MD

## 2022-03-29 ENCOUNTER — Other Ambulatory Visit: Payer: Self-pay | Admitting: Nurse Practitioner

## 2022-03-29 DIAGNOSIS — I152 Hypertension secondary to endocrine disorders: Secondary | ICD-10-CM

## 2022-03-30 NOTE — Telephone Encounter (Signed)
Requested medications are due for refill today.  no  Requested medications are on the active medications list.  no  Last refill. 8/14  Future visit scheduled.   yes  Notes to clinic.  Med was d/c'd 02/22/2022. Not on med list. Refill not delegated.    Requested Prescriptions  Pending Prescriptions Disp Refills   tiZANidine (ZANAFLEX) 2 MG tablet [Pharmacy Med Name: tiZANidine HCl 2 MG Oral Tablet] 30 tablet 0    Sig: TAKE 1 TABLET BY MOUTH EVERY 8 HOURS AS NEEDED FOR MUSCLE SPASM     Not Delegated - Cardiovascular:  Alpha-2 Agonists - tizanidine Failed - 03/29/2022 11:36 AM      Failed - This refill cannot be delegated      Passed - Valid encounter within last 6 months    Recent Outpatient Visits           1 month ago Hypertension associated with diabetes ([place])   [facility] Jon Billings, NP   2 months ago Hypertension associated with diabetes ([place])   [facility] Jon Billings, NP   3 months ago Hypertension associated with diabetes ([place])   [facility] Jon Billings, NP   3 months ago 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

## 2022-03-30 NOTE — Telephone Encounter (Signed)
Requested Prescriptions  Pending Prescriptions Disp Refills  . lisinopril (ZESTRIL) 40 MG tablet [Pharmacy Med Name: Lisinopril 40 MG Oral Tablet] 90 tablet 0    Sig: Take 1 tablet by mouth once daily     Cardiovascular:  ACE Inhibitors Passed - 03/29/2022 11:36 AM      Passed - Cr in normal range and within 180 days    Creatinine, Ser  Date Value Ref Range Status  12/08/2021 0.62 0.57 - 1.00 mg/dL Final         Passed - K in normal range and within 180 days    Potassium  Date Value Ref Range Status  12/08/2021 3.5 3.5 - 5.2 mmol/L Final         Passed - Patient is not pregnant      Passed - Last BP in normal range    BP Readings from Last 1 Encounters:  03/15/22 136/72         Passed - Valid encounter within last 6 months    Recent Outpatient Visits          1 month ago Hypertension associated with diabetes ([place])   [facility] Jon Billings, NP   2 months ago Hypertension associated with diabetes ([place])   [facility] Jon Billings, NP   3 months ago Hypertension associated with diabetes ([place])   [facility] Jon Billings, NP   3 months ago 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 amLODipine ([place]) 10 MG tablet [Pharmacy Med Name: amLODIPine Besylate 10 MG Oral Tablet] 90 tablet 0    Sig: Take 1 tablet by mouth once daily     Cardiovascular: Calcium Channel Blockers 2 Passed - 03/29/2022 11:36 AM      Passed - Last BP in normal range    BP Readings from Last 1 Encounters:  03/15/22 136/72         Passed - Last Heart Rate in normal range    Pulse Readings from Last 1 Encounters:  03/15/22 95         Passed - Valid encounter within last 6 months    Recent Outpatient  Visits          1 month ago Hypertension associated with diabetes ([place])   [facility] Jon Billings, NP   2 months ago Hypertension associated with diabetes ([place])   [facility] Jon Billings, NP   3 months ago Hypertension associated with diabetes ([place])   [facility] Jon Billings, NP   3 months ago 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

## 2022-04-10 ENCOUNTER — Other Ambulatory Visit: Payer: Self-pay | Admitting: Nurse Practitioner

## 2022-04-10 ENCOUNTER — Ambulatory Visit: Payer: Self-pay

## 2022-04-10 NOTE — Telephone Encounter (Signed)
  Chief Complaint: left thumb pain Symptoms: pain Frequency: Unsure - 1 week Pertinent Negatives: Patient denies  Disposition: [] ED /[] Urgent Care (no appt availability in office) / [x] Appointment(In office/virtual)/ []  [facility]/ [] Home Care/ [] Refused Recommended Disposition /[] [facility]/ []  Follow-up with PCP Additional Notes: Call from pt's daughter Levada Dy. Pt has had left thumb pain for 1 week. Daughter is not sure of any any other s/s.    Reason for Disposition  [1] MODERATE pain (e.g., interferes with normal activities) AND [2] present > 3 days  Answer Assessment - Initial Assessment Questions 1. ONSET: "When did the pain start?"      1 week  2. LOCATION and RADIATION: "Where is the pain located?"  (e.g., fingertip, around nail, joint, entire  finger)      left 3. SEVERITY: "How bad is the pain?" "What does it keep you from doing?"   (Scale 1-10; or mild, moderate, severe)  - MILD (1-3): doesn't interfere with normal activities.   - MODERATE (4-7): interferes with normal activities or awakens from sleep.  - SEVERE (8-10): excruciating pain, unable to hold a glass of water or bend finger even a little.     Very painful 4. APPEARANCE: "What does the finger look like?" (e.g., redness, swelling, bruising, pallor)     Unknown - a little swollen 5. WORK OR EXERCISE: "Has there been any recent work or exercise that involved this part (i.e., fingers or hand) of the body?"      6. CAUSE: "What do you think is causing the pain?"     Unsure - arthritis 7. AGGRAVATING FACTORS: "What makes the pain worse?" (e.g., using computer)      8. OTHER SYMPTOMS: "Do you have any other symptoms?" (e.g., fever, neck pain, numbness)      9. PREGNANCY: "Is there any chance you are pregnant?" "When was your last menstrual period?"  Protocols used: Finger Pain-A-AH

## 2022-04-11 ENCOUNTER — Encounter: Payer: Self-pay | Admitting: Physician Assistant

## 2022-04-11 ENCOUNTER — Ambulatory Visit (INDEPENDENT_AMBULATORY_CARE_PROVIDER_SITE_OTHER): Payer: Medicare Other | Admitting: Physician Assistant

## 2022-04-11 ENCOUNTER — Ambulatory Visit: Payer: Medicare Other | Admitting: Family Medicine

## 2022-04-11 VITALS — BP 128/79 | HR 103 | Temp 98.5°F | Ht 62.01 in | Wt 149.0 lb

## 2022-04-11 DIAGNOSIS — M778 Other enthesopathies, not elsewhere classified: Secondary | ICD-10-CM | POA: Diagnosis not present

## 2022-04-11 NOTE — Assessment & Plan Note (Signed)
Acute, new concern Reports swelling and pain over radial aspect of left wrist since Sunday  States pain is gradually improving but is aggravated by moving thumb Suspect tendonitis but cannot definitively rule out gout, osteoarthritis  Recommend using thumb spica splint and alternating Tylenol and Ibuprofen for pain  Reviewed doses of OTC pain medications with patient and her daughter- both voiced understanding and agreement Recommend returning to office if not improving in 2-3 weeks with conservative management Follow up as needed for progressing or persistent symptoms

## 2022-04-11 NOTE — Telephone Encounter (Signed)
Requested Prescriptions  Pending Prescriptions Disp Refills  . chlorthalidone (HYGROTON) 25 MG tablet [Pharmacy Med Name: Chlorthalidone 25 MG Oral Tablet] 90 tablet 0    Sig: Take 1 tablet by mouth once daily     Cardiovascular: Diuretics - Thiazide Passed - 04/10/2022  4:13 PM      Passed - Cr in normal range and within 180 days    Creatinine, Ser  Date Value Ref Range Status  12/08/2021 0.62 0.57 - 1.00 mg/dL Final         Passed - K in normal range and within 180 days    Potassium  Date Value Ref Range Status  12/08/2021 3.5 3.5 - 5.2 mmol/L Final         Passed - Na in normal range and within 180 days    Sodium  Date Value Ref Range Status  12/08/2021 141 134 - 144 mmol/L Final         Passed - Last BP in normal range    BP Readings from Last 1 Encounters:  04/11/22 128/79         Passed - Valid encounter within last 6 months    Recent Outpatient Visits          Today    [place], Dani Gobble, PA-C   1 month ago Hypertension associated with diabetes ([place])   [facility] Jon Billings, NP   2 months ago Hypertension associated with diabetes [facility])   [facility] Jon Billings, NP   3 months ago Hypertension associated with diabetes ([place])   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

## 2022-04-11 NOTE — Progress Notes (Signed)
Acute Office Visit   Patient: Ellen [name]   DOB: 11/30/1940   81 y.o. Female  MRN: [id number] Visit Date: 04/11/2022  Today's healthcare provider: Dani Gobble Nyhla Mountjoy, PA-C  Introduced myself to the patient as a Journalist, newspaper and provided education on APPs in clinical practice.    Chief Complaint  Patient presents with   Wrist Pain    Left wrist pain near base of thumb, started on Sunday morning.    Subjective    Wrist Pain  Pertinent negatives include no numbness.   HPI     Wrist Pain    Additional comments: Left wrist pain near base of thumb, started on Sunday morning.       Last edited by Almon Register, PA-C on 04/11/2022  2:46 PM.      Left wrist pain  Reports she woke up Sunday morning with the pain Pain has improved since Sun Pain level: 8/10 soreness in thumb Alleviating: nothing so far Aggravating: holding things with thumb Interventions: Flexeril did not provide relief, She has tried OTC muscle rub but no relief with that either  Denies falls, injuries or trauma to the area.  She denies repetitive motions or activities prior to pain starting   Medications: Outpatient Medications Prior to Visit  Medication Sig   amLODipine (NORVASC) 10 MG tablet Take 1 tablet by mouth once daily   aspirin 81 MG tablet Take 81 mg by mouth daily.   atorvastatin (LIPITOR) 20 MG tablet Take 1 tablet (20 mg total) by mouth daily.   blood glucose meter kit and supplies KIT Dispense based on patient and insurance preference. Use up to four times daily as directed. (FOR ICD-9 250.00, 250.01).   CALCIUM-MAGNESIUM-ZINC PO Take 1 tablet by mouth daily.   calcium-vitamin D 250-100 MG-UNIT tablet Take 1 tablet by mouth 2 (two) times daily.   Cholecalciferol (VITAMIN D3) 1000 units CAPS Take 2 capsules by mouth daily.    cyclobenzaprine (FLEXERIL) 10 MG tablet Take 1 tablet (10 mg total) by mouth in the morning and at bedtime.   lisinopril (ZESTRIL) 40 MG tablet Take 1 tablet (40 mg  total) by mouth daily.   Magnesium 500 MG TABS Take 1 tablet by mouth daily.   metFORMIN (GLUCOPHAGE) 500 MG tablet Start out taking 1 tablet (500 MG) by mouth every morning with meal, then on week two start taking 1 tablet (500 MG) by mouth in morning with meal and 1 tablet (500 MG) in evening with meal.   vitamin C (ASCORBIC ACID) 500 MG tablet Take 500 mg by mouth daily.   [DISCONTINUED] chlorthalidone (HYGROTON) 25 MG tablet Take 1 tablet (25 mg total) by mouth daily.   No facility-administered medications prior to visit.    Review of Systems  Musculoskeletal:        Left wrist and thumb pain  Neurological:  Positive for weakness (left hand). Negative for numbness.       Objective    BP 128/79   Pulse (!) 103   Temp 98.5 F (36.9 C) (Oral)   Ht 5' 2.01" (1.575 m)   Wt 149 lb (67.6 kg)   LMP 11/28/1994 (Approximate)   SpO2 98%   BMI 27.25 kg/m    Physical Exam Vitals reviewed.  Constitutional:      General: She is awake.     Appearance: Normal appearance. She is well-developed and well-groomed.  HENT:     Head: Normocephalic and atraumatic.  Musculoskeletal:     Right wrist: Swelling, tenderness, bony tenderness and snuff box tenderness present. Decreased range of motion. Normal pulse.     Left wrist: No tenderness, bony tenderness or snuff box tenderness. Normal range of motion. Normal pulse.     Right hand: No swelling or tenderness. Normal range of motion. Normal strength. Normal pulse.     Left hand: No swelling or tenderness. Normal range of motion. Normal strength. Normal pulse.     Comments: Left wrist is mildly swollen over the radial aspect with pain elicited from thumb movement  Able to laterally flex, extend and dorsiflex at wrist in symmetrical alignment with right wrist Negative Finkelstein testing  Tenderness elicited over left snuff box with palpation   Neurological:     Mental Status: She is alert.  Psychiatric:        Behavior: Behavior is  cooperative.       No results found for any visits on 04/11/22.  Assessment & Plan      No follow-ups on file.      Problem List Items Addressed This Visit       Musculoskeletal and Integument   Left hand tendonitis - Primary    Acute, new concern Reports swelling and pain over radial aspect of left wrist since Sunday  States pain is gradually improving but is aggravated by moving thumb Suspect tendonitis but cannot definitively rule out gout, osteoarthritis  Recommend using thumb spica splint and alternating Tylenol and Ibuprofen for pain  Reviewed doses of OTC pain medications with patient and her daughter- both voiced understanding and agreement Recommend returning to office if not improving in 2-3 weeks with conservative management Follow up as needed for progressing or persistent symptoms        No follow-ups on file.   I, Niomi Valent E Sol Odor, PA-C, have reviewed all documentation for this visit. The documentation on 04/11/22 for the exam, diagnosis, procedures, and orders are all accurate and complete.   Talitha Givens, MHS, PA-C [facility]

## 2022-04-11 NOTE — Patient Instructions (Addendum)
I recommend the following for your wrist pain :  Using a thumb spica splint to immobilize your thumb- wear this throughout the day and evening to help with recovery  You can take tylenol and ibuprofen throughout the day  You can alternate these every four hours   You can have up to 3500 mg of Tylenol in divided doses per day  You can have up to 3200 mg of Ibuprofen in divided doses per day. I'd recommend taking 800 mg about 3 times per day to help with the inflammation and pain  It was nice to meet you and I appreciate the opportunity to be involved in your care If you were satisfied with the care you received from me, I would greatly appreciate you saying so in the after-visit survey that is sent out following our visit.

## 2022-04-12 ENCOUNTER — Ambulatory Visit: Payer: Medicare Other | Attending: Anesthesiology | Admitting: Anesthesiology

## 2022-04-12 ENCOUNTER — Encounter: Payer: Self-pay | Admitting: Anesthesiology

## 2022-04-12 VITALS — BP 143/82 | HR 101 | Temp 97.0°F | Resp 16 | Ht 62.0 in | Wt 150.0 lb

## 2022-04-12 DIAGNOSIS — M5431 Sciatica, right side: Secondary | ICD-10-CM | POA: Diagnosis not present

## 2022-04-12 DIAGNOSIS — M545 Low back pain, unspecified: Secondary | ICD-10-CM | POA: Diagnosis not present

## 2022-04-12 DIAGNOSIS — M48062 Spinal stenosis, lumbar region with neurogenic claudication: Secondary | ICD-10-CM

## 2022-04-12 DIAGNOSIS — G8929 Other chronic pain: Secondary | ICD-10-CM

## 2022-04-12 DIAGNOSIS — M4716 Other spondylosis with myelopathy, lumbar region: Secondary | ICD-10-CM

## 2022-04-12 DIAGNOSIS — M51369 Other intervertebral disc degeneration, lumbar region without mention of lumbar back pain or lower extremity pain: Secondary | ICD-10-CM

## 2022-04-12 DIAGNOSIS — M542 Cervicalgia: Secondary | ICD-10-CM

## 2022-04-12 DIAGNOSIS — M5136 Other intervertebral disc degeneration, lumbar region: Secondary | ICD-10-CM

## 2022-04-12 MED ORDER — CYCLOBENZAPRINE HCL 10 MG PO TABS: 10.0000 mg | ORAL_TABLET | Freq: Two times a day (BID) | ORAL | 3 refills | Status: DC

## 2022-04-12 MED ORDER — TRAMADOL HCL 50 MG PO TABS: 50.0000 mg | ORAL_TABLET | Freq: Three times a day (TID) | ORAL | 1 refills | Status: DC

## 2022-04-12 NOTE — Patient Instructions (Signed)

## 2022-04-12 NOTE — Progress Notes (Signed)
Nursing Pain Medication Assessment:  Safety precautions to be maintained throughout the outpatient stay will include: orient to surroundings, keep bed in low position, maintain call bell within reach at all times, provide assistance with transfer out of bed and ambulation.  Medication Inspection Compliance: Ms. Tino did not comply with our request to bring her pills to be counted. She was reminded that bringing the medication bottles, even when empty, is a requirement.  Medication: None brought in. Pill/Patch Count: None available to be counted. Bottle Appearance: No container available. Did not bring bottle(s) to appointment. Filled Date: N/A Last Medication intake:  Today

## 2022-04-14 ENCOUNTER — Encounter: Payer: Self-pay | Admitting: Anesthesiology

## 2022-04-14 NOTE — Progress Notes (Signed)
Subjective:  Patient ID: Ellen [name], female    DOB: 1940-11-14  Age: 81 y.o. MRN: [id number]  CC: Neck Pain   Procedure: None  HPI [name] presents for reevaluation.  She was last seen in August and then in September at which point she had bilateral trapezius muscle injections.  She reports at this time that she is having minimal cervical pain and is functioning well.  She is try to do some stretching strengthening exercises on a daily basis and otherwise is getting along well.  No other changes are reported.  Outpatient Medications Prior to Visit  Medication Sig Dispense Refill   amLODipine (NORVASC) 10 MG tablet Take 1 tablet by mouth once daily 90 tablet 0   aspirin 81 MG tablet Take 81 mg by mouth daily.     atorvastatin (LIPITOR) 20 MG tablet Take 1 tablet (20 mg total) by mouth daily. 90 tablet 4   blood glucose meter kit and supplies KIT Dispense based on patient and insurance preference. Use up to four times daily as directed. (FOR ICD-9 250.00, 250.01). 1 each 0   CALCIUM-MAGNESIUM-ZINC PO Take 1 tablet by mouth daily.     calcium-vitamin D 250-100 MG-UNIT tablet Take 1 tablet by mouth 2 (two) times daily.     chlorthalidone (HYGROTON) 25 MG tablet Take 1 tablet by mouth once daily 90 tablet 0   Cholecalciferol (VITAMIN D3) 1000 units CAPS Take 2 capsules by mouth daily.      lisinopril (ZESTRIL) 40 MG tablet Take 1 tablet (40 mg total) by mouth daily. 90 tablet 1   Magnesium 500 MG TABS Take 1 tablet by mouth daily.     metFORMIN (GLUCOPHAGE) 500 MG tablet Start out taking 1 tablet (500 MG) by mouth every morning with meal, then on week two start taking 1 tablet (500 MG) by mouth in morning with meal and 1 tablet (500 MG) in evening with meal. 180 tablet 3   vitamin C (ASCORBIC ACID) 500 MG tablet Take 500 mg by mouth daily.     cyclobenzaprine (FLEXERIL) 10 MG tablet Take 1 tablet (10 mg total) by mouth in the morning and at bedtime. 60 tablet 3   No  facility-administered medications prior to visit.    Review of Systems CNS: No confusion or sedation Cardiac: No angina or palpitations GI: No abdominal pain or constipation Constitutional: No nausea vomiting fevers or chills  Objective:  BP (!) 143/82   Pulse (!) 101   Temp (!) 97 F (36.1 C)   Resp 16   Ht 5' 2" (1.575 m)   Wt 150 lb (68 kg)   LMP 11/28/1994 (Approximate)   SpO2 100%   BMI 27.44 kg/m    BP Readings from Last 3 Encounters:  04/12/22 (!) 143/82  04/11/22 128/79  03/15/22 136/72     Wt Readings from Last 3 Encounters:  04/12/22 150 lb (68 kg)  04/11/22 149 lb (67.6 kg)  03/15/22 150 lb (68 kg)     Physical Exam Pt is alert and oriented PERRL EOMI HEART IS RRR no murmur or rub LCTA no wheezing or rales MUSCULOSKELETAL reveals less tenderness in the bilateral trapezius muscles.  She has good upper extremity strength rated at 5/5 with good range of motion.  Good range of motion at the atlantooccipital joint.  Labs  Lab Results  Component Value Date   HGBA1C 6.0 (H) 12/08/2021   HGBA1C 5.7 (H) 09/06/2021   HGBA1C 6.2 (H) 06/08/2021  Lab Results  Component Value Date   MICROALBUR 10 02/23/2021   LDLCALC 57 12/08/2021   CREATININE 0.62 12/08/2021    -------------------------------------------------------------------------------------------------------------------- Lab Results  Component Value Date   WBC 5.2 12/08/2021   HGB 11.7 12/08/2021   HCT 37.2 12/08/2021   PLT 267 12/08/2021   GLUCOSE 98 12/08/2021   CHOL 140 12/08/2021   TRIG 95 12/08/2021   HDL 65 12/08/2021   LDLCALC 57 12/08/2021   ALT 11 12/08/2021   AST 20 12/08/2021   NA 141 12/08/2021   K 3.5 12/08/2021   CL 102 12/08/2021   CREATININE 0.62 12/08/2021   BUN 14 12/08/2021   CO2 24 12/08/2021   TSH 1.620 02/23/2021   HGBA1C 6.0 (H) 12/08/2021   MICROALBUR 10 02/23/2021     --------------------------------------------------------------------------------------------------------------------- No results found.   Assessment & Plan:   Colton was seen today for neck pain.  Diagnoses and all orders for this visit:  Spinal stenosis of lumbar region with neurogenic claudication  DDD (degenerative disc disease), lumbar  Cervicalgia -     INJECT TRIGGER POINT, 1 OR 2  Sciatica of right side  Spondylosis, lumbar, with myelopathy  Chronic bilateral low back pain without sciatica  Other orders -     cyclobenzaprine (FLEXERIL) 10 MG tablet; Take 1 tablet (10 mg total) by mouth in the morning and at bedtime. -     traMADol (ULTRAM) 50 MG tablet; Take 1 tablet (50 mg total) by mouth 3 (three) times daily.        ----------------------------------------------------------------------------------------------------------------------  Problem List Items Addressed This Visit       Unprioritized   Back pain   Relevant Medications   cyclobenzaprine (FLEXERIL) 10 MG tablet   traMADol (ULTRAM) 50 MG tablet   Cervicalgia   Relevant Orders   INJECT TRIGGER POINT, 1 OR 2   Other Visit Diagnoses     Spinal stenosis of lumbar region with neurogenic claudication    -  Primary   Relevant Medications   cyclobenzaprine (FLEXERIL) 10 MG tablet   traMADol (ULTRAM) 50 MG tablet   DDD (degenerative disc disease), lumbar       Relevant Medications   cyclobenzaprine (FLEXERIL) 10 MG tablet   traMADol (ULTRAM) 50 MG tablet   Sciatica of right side       Relevant Medications   cyclobenzaprine (FLEXERIL) 10 MG tablet   Spondylosis, lumbar, with myelopathy       Relevant Medications   cyclobenzaprine (FLEXERIL) 10 MG tablet   traMADol (ULTRAM) 50 MG tablet         ----------------------------------------------------------------------------------------------------------------------  1. Spinal stenosis of lumbar region with neurogenic claudication Continue  core stretching strengthening and we will defer on any other interventional therapy at this time  2. DDD (degenerative disc disease), lumbar As above  3. Cervicalgia We will defer on injection today and continue core stretching and exercises as reviewed - INJECT TRIGGER POINT, 1 OR 2  4. Sciatica of right side   5. Spondylosis, lumbar, with myelopathy   6. Chronic bilateral low back pain without sciatica     ----------------------------------------------------------------------------------------------------------------------  I am having Karlissa E. Keesey start on traMADol. I am also having her maintain her aspirin, Vitamin D3, Magnesium, calcium-vitamin D, ascorbic acid, CALCIUM-MAGNESIUM-ZINC PO, blood glucose meter kit and supplies, metFORMIN, atorvastatin, lisinopril, amLODipine, chlorthalidone, and cyclobenzaprine.   Meds ordered this encounter  Medications   cyclobenzaprine (FLEXERIL) 10 MG tablet    Sig: Take 1 tablet (10 mg total) by mouth in  the morning and at bedtime.    Dispense:  60 tablet    Refill:  3   traMADol (ULTRAM) 50 MG tablet    Sig: Take 1 tablet (50 mg total) by mouth 3 (three) times daily.    Dispense:  90 tablet    Refill:  1   Patient's Medications  New Prescriptions   TRAMADOL (ULTRAM) 50 MG TABLET    Take 1 tablet (50 mg total) by mouth 3 (three) times daily.  Previous Medications   AMLODIPINE (NORVASC) 10 MG TABLET    Take 1 tablet by mouth once daily   ASPIRIN 81 MG TABLET    Take 81 mg by mouth daily.   ATORVASTATIN (LIPITOR) 20 MG TABLET    Take 1 tablet (20 mg total) by mouth daily.   BLOOD GLUCOSE METER KIT AND SUPPLIES KIT    Dispense based on patient and insurance preference. Use up to four times daily as directed. (FOR ICD-9 250.00, 250.01).   CALCIUM-MAGNESIUM-ZINC PO    Take 1 tablet by mouth daily.   CALCIUM-VITAMIN D 250-100 MG-UNIT TABLET    Take 1 tablet by mouth 2 (two) times daily.   CHLORTHALIDONE (HYGROTON) 25 MG TABLET     Take 1 tablet by mouth once daily   CHOLECALCIFEROL (VITAMIN D3) 1000 UNITS CAPS    Take 2 capsules by mouth daily.    LISINOPRIL (ZESTRIL) 40 MG TABLET    Take 1 tablet (40 mg total) by mouth daily.   MAGNESIUM 500 MG TABS    Take 1 tablet by mouth daily.   METFORMIN (GLUCOPHAGE) 500 MG TABLET    Start out taking 1 tablet (500 MG) by mouth every morning with meal, then on week two start taking 1 tablet (500 MG) by mouth in morning with meal and 1 tablet (500 MG) in evening with meal.   VITAMIN C (ASCORBIC ACID) 500 MG TABLET    Take 500 mg by mouth daily.  Modified Medications   Modified Medication Previous Medication   CYCLOBENZAPRINE (FLEXERIL) 10 MG TABLET cyclobenzaprine (FLEXERIL) 10 MG tablet      Take 1 tablet (10 mg total) by mouth in the morning and at bedtime.    Take 1 tablet (10 mg total) by mouth in the morning and at bedtime.  Discontinued Medications   No medications on file   ----------------------------------------------------------------------------------------------------------------------  Follow-up: Return in about 3 months (around 07/13/2022) for evaluation, procedure.    Molli Barrows, MD

## 2022-05-09 ENCOUNTER — Ambulatory Visit (INDEPENDENT_AMBULATORY_CARE_PROVIDER_SITE_OTHER): Payer: Medicare Other | Admitting: *Deleted

## 2022-05-09 DIAGNOSIS — Z0000 Encounter for general adult medical examination without abnormal findings: Secondary | ICD-10-CM

## 2022-05-09 NOTE — Progress Notes (Signed)
Subjective:   Ellen [name] is a 81 y.o. female who presents for Medicare Annual (Subsequent) preventive examination.   I connected with  Royetta Crochet Santoni on 05/09/22 by a telephone enabled telemedicine application and verified that I am speaking with the correct person using two identifiers.   I discussed the limitations of evaluation and management by telemedicine. The patient expressed understanding and agreed to proceed.  Patient location: home  Provider location: Tele-health-home    Review of Systems     Cardiac Risk Factors include: advanced age (>73mn, >>78women);diabetes mellitus;family history of premature cardiovascular disease;hypertension     Objective:    Today's Vitals   There is no height or weight on file to calculate BMI.     05/09/2022   10:35 AM 04/12/2022    1:05 PM 03/15/2022   10:43 AM 02/02/2022   12:32 AM 04/28/2021   10:04 AM 01/10/2021   12:45 PM 04/26/2020    3:17 PM  Advanced Directives  Does Patient Have a Medical Advance Directive? Yes No No No No Yes No  Type of Advance Directive HSymsoniain Chart? No - copy requested        Would patient like information on creating a medical advance directive?  No - Patient declined No - Patient declined No - Patient declined No - Patient declined      Current Medications (verified) Outpatient Encounter Medications as of 05/09/2022  Medication Sig   amLODipine (NORVASC) 10 MG tablet Take 1 tablet by mouth once daily   aspirin 81 MG tablet Take 81 mg by mouth daily.   atorvastatin (LIPITOR) 20 MG tablet Take 1 tablet (20 mg total) by mouth daily.   blood glucose meter kit and supplies KIT Dispense based on patient and insurance preference. Use up to four times daily as directed. (FOR ICD-9 250.00, 250.01).   CALCIUM-MAGNESIUM-ZINC PO Take 1 tablet by mouth daily.   calcium-vitamin D 250-100 MG-UNIT tablet Take 1 tablet by mouth 2  (two) times daily.   chlorthalidone (HYGROTON) 25 MG tablet Take 1 tablet by mouth once daily   Cholecalciferol (VITAMIN D3) 1000 units CAPS Take 2 capsules by mouth daily.    cyclobenzaprine (FLEXERIL) 10 MG tablet Take 1 tablet (10 mg total) by mouth in the morning and at bedtime.   lisinopril (ZESTRIL) 40 MG tablet Take 1 tablet (40 mg total) by mouth daily.   Magnesium 500 MG TABS Take 1 tablet by mouth daily.   metFORMIN (GLUCOPHAGE) 500 MG tablet Start out taking 1 tablet (500 MG) by mouth every morning with meal, then on week two start taking 1 tablet (500 MG) by mouth in morning with meal and 1 tablet (500 MG) in evening with meal.   traMADol (ULTRAM) 50 MG tablet Take 1 tablet (50 mg total) by mouth 3 (three) times daily.   vitamin C (ASCORBIC ACID) 500 MG tablet Take 500 mg by mouth daily.   No facility-administered encounter medications on file as of 05/09/2022.    Allergies (verified) Patient has no known allergies.   History: Past Medical History:  Diagnosis Date   Hyperlipidemia    Hypertension    Lumbago    Menopause    Migraines    Past Surgical History:  Procedure Laterality Date   CHOLECYSTECTOMY  1982   TUBAL LIGATION  1984   Family History  Problem Relation Age of Onset  Alzheimer's disease Mother    Diabetes Mother    Stroke Mother    Hyperlipidemia Mother    Stroke Father    Hypertension Father    Hyperlipidemia Father    Diabetes Sister    Diabetes Brother    Hypertension Brother    Diabetes Sister    Social History   Socioeconomic History   Marital status: Married    Spouse name: Not on file   Number of children: Not on file   Years of education: some college   Highest education level: Some college, no degree  Occupational History   Occupation: retired  Tobacco Use   Smoking status: Never   Smokeless tobacco: Never  Scientific laboratory technician Use: Never used  Substance and Sexual Activity   Alcohol use: No    Alcohol/week: 0.0 standard  drinks of alcohol   Drug use: No   Sexual activity: Yes  Other Topics Concern   Not on file  Social History Narrative   Not on file   Social Determinants of Health   Financial Resource Strain: Low Risk  (05/09/2022)   Overall Financial Resource Strain (CARDIA)    Difficulty of Paying Living Expenses: Not hard at all  Food Insecurity: No Food Insecurity (05/09/2022)   Hunger Vital Sign    Worried About [place] in the Last Year: Never true    [place] in the Last Year: Never true  Transportation Needs: No Transportation Needs (05/09/2022)   PRAPARE - Hydrologist (Medical): No    Lack of Transportation (Non-Medical): No  Physical Activity: Inactive (05/09/2022)   Exercise Vital Sign    Days of Exercise per Week: 0 days    Minutes of Exercise per Session: 0 min  Stress: No Stress Concern Present (05/09/2022)   [place]    Feeling of Stress : Not at all  Social Connections: [place] (05/09/2022)   Social Connection and Isolation Panel [NHANES]    Frequency of Communication with Friends and Family: More than three times a week    Frequency of Social Gatherings with Friends and Family: Twice a week    Attends Religious Services: More than 4 times per year    Active Member of [employer] or Organizations: Yes    Attends Music therapist: More than 4 times per year    Marital Status: Married    Tobacco Counseling Counseling given: Not Answered   Clinical Intake:  Pre-visit preparation completed: Yes  Pain : No/denies pain     Diabetes: Yes CBG done?: No Did pt. bring in CBG monitor from home?: No  How often do you need to have someone help you when you read instructions, pamphlets, or other written materials from your doctor or pharmacy?: 1 - Never  Diabetic?  Yes  Nutrition Risk Assessment:  Has the patient had any N/V/D within the  last 2 months?  No  Does the patient have any non-healing wounds?  No  Has the patient had any unintentional weight loss or weight gain?  No   Diabetes:  Is the patient diabetic?  Yes  If diabetic, was a CBG obtained today?  No  Did the patient bring in their glucometer from home?  No  How often do you monitor your CBG's? Does not check regulary.   Financial Strains and Diabetes Management:  Are you having any financial strains with the device, your  supplies or your medication? No .  Does the patient want to be seen by Chronic Care Management for management of their diabetes?  No  Would the patient like to be referred to a Nutritionist or for Diabetic Management?  No   Diabetic Exams:  Diabetic Eye Exam: Completed .Marland Kitchen Pt has been advised about the importance in completing this exam  Diabetic Foot Exam:  Pt has been advised about the importance in completing this exam.   Interpreter Needed?: No  Information entered by :: Leroy Kennedy LPN   Activities of Daily Living    05/09/2022   10:39 AM  In your present state of health, do you have any difficulty performing the following activities:  Hearing? 0  Vision? 0  Difficulty concentrating or making decisions? 0  Walking or climbing stairs? 0  Dressing or bathing? 0  Doing errands, shopping? 0  Using the Toilet? N  In the past six months, have you accidently leaked urine? N  Do you have problems with loss of bowel control? N  Managing your Medications? N  Managing your Finances? N  Housekeeping or managing your Housekeeping? N    Patient Care Team: Jon Billings, NP as PCP - General  Indicate any recent Medical Services you may have received from other than [facility] providers in the past year (date may be approximate).     Assessment:   This is a routine wellness examination for [facility].  Hearing/Vision screen Hearing Screening - Comments:: No trouble hearing Vision Screening - Comments:: Up to date My eye  doctor  Dietary issues and exercise activities discussed: Current Exercise Habits: The patient does not participate in regular exercise at present   Goals Addressed             This Visit's Progress    Patient Stated       Would like to gain some weight       Depression Screen    05/09/2022   10:41 AM 04/12/2022    1:05 PM 04/11/2022    2:08 PM 03/15/2022   10:42 AM 02/22/2022    3:42 PM 01/18/2022   10:57 AM 12/19/2021   10:25 AM  PHQ 2/9 Scores  PHQ - 2 Score 0 0 0 0 0 0 0  PHQ- 9 Score 0  0  0 0 0    Fall Risk    05/09/2022   10:36 AM 04/12/2022    1:04 PM 04/11/2022    2:07 PM 03/15/2022   10:42 AM 02/22/2022    3:42 PM  Fall Risk   Falls in the past year? 0 0 0 0 0  Number falls in past yr: 0  0  0  Injury with Fall? 0  0  0  Risk for fall due to :   No Fall Risks  No Fall Risks  Follow up Falls evaluation completed;Education provided;Falls prevention discussed  Falls evaluation completed  Falls evaluation completed    FALL RISK PREVENTION PERTAINING TO THE HOME:  Any stairs in or around the home? Yes  If so, are there any without handrails? No  Home free of loose throw rugs in walkways, pet beds, electrical cords, etc? Yes  Adequate lighting in your home to reduce risk of falls? Yes   ASSISTIVE DEVICES UTILIZED TO PREVENT FALLS:  Life alert? No  Use of a cane, walker or w/c? No  Grab bars in the bathroom? No  Shower chair or bench in shower? No  Elevated toilet seat or  a handicapped toilet? Yes   TIMED UP AND GO:  Was the test performed? No .    Cognitive Function:    06/08/2021   10:03 AM  MMSE - Mini Mental State Exam  Orientation to time 5  Orientation to Place 5  Registration 3  Attention/ Calculation 5  Recall 2  Language- name 2 objects 2  Language- repeat 1  Language- follow 3 step command 3  Language- read & follow direction 1  Write a sentence 1  Copy design 0  Total score 28        05/09/2022   10:37 AM 04/26/2020    3:21  PM 04/23/2019    3:08 PM 04/15/2018    3:53 PM 04/13/2017    8:20 AM  6CIT Screen  What Year? 0 points 0 points 0 points 0 points 0 points  What month? 0 points 0 points 0 points 0 points 0 points  What time? 0 points 0 points 0 points 0 points 0 points  Count back from 20 0 points 0 points 0 points 0 points 0 points  Months in reverse 0 points 0 points 0 points 0 points 0 points  Repeat phrase 0 points 2 points 0 points 2 points 0 points  Total Score 0 points 2 points 0 points 2 points 0 points    Immunizations Immunization History  Administered Date(s) Administered   Fluad Quad(high Dose 65+) 03/18/2019, 03/05/2020, 03/09/2021   Influenza, High Dose Seasonal PF 03/06/2016, 04/13/2017, 04/15/2018   Influenza,inj,Quad PF,6+ Mos 03/30/2015   PFIZER(Purple Top)SARS-COV-2 Vaccination 07/04/2019, 07/28/2019, 03/29/2020, 10/25/2020, 03/15/2021   Pneumococcal Conjugate-13 02/02/2014, 03/15/2021   Pneumococcal Polysaccharide-23 01/27/2013   Td 07/21/2008    TDAP status: Due, Education has been provided regarding the importance of this vaccine. Advised may receive this vaccine at local pharmacy or Health Dept. Aware to provide a copy of the vaccination record if obtained from local pharmacy or Health Dept. Verbalized acceptance and understanding.  Flu Vaccine status: Due, Education has been provided regarding the importance of this vaccine. Advised may receive this vaccine at local pharmacy or Health Dept. Aware to provide a copy of the vaccination record if obtained from local pharmacy or Health Dept. Verbalized acceptance and understanding.  Pneumococcal vaccine status: Up to date  Covid-19 vaccine status: Information provided on how to obtain vaccines.   Qualifies for Shingles Vaccine? Yes   Zostavax completed No   Shingrix Completed?: No.    Education has been provided regarding the importance of this vaccine. Patient has been advised to call insurance company to determine out of  pocket expense if they have not yet received this vaccine. Advised may also receive vaccine at local pharmacy or Health Dept. Verbalized acceptance and understanding.  Screening Tests Health Maintenance  Topic Date Due   FOOT EXAM  04/08/2022   COVID-19 Vaccine (6 - 2023-24 season) 05/25/2022 (Originally 02/17/2022)   Zoster Vaccines- Shingrix (1 of 2) 08/09/2022 (Originally 06/14/1993)   INFLUENZA VACCINE  09/17/2022 (Originally 01/17/2022)   MAMMOGRAM  05/10/2023 (Originally 06/05/2020)   HEMOGLOBIN A1C  06/09/2022   OPHTHALMOLOGY EXAM  07/26/2022   Diabetic kidney evaluation - GFR measurement  12/09/2022   Diabetic kidney evaluation - Urine ACR  12/09/2022   Medicare Annual Wellness (AWV)  05/10/2023   Pneumonia Vaccine 26+ Years old  Completed   DEXA SCAN  Completed   Hepatitis C Screening  Completed   HPV VACCINES  Aged Out   COLONOSCOPY (Pts 45-17yr Insurance coverage will need to  be confirmed)  Discontinued    Health Maintenance  Health Maintenance Due  Topic Date Due   FOOT EXAM  04/08/2022    Colorectal cancer screening: No longer required.   Mammogram status: No longer required due to age.  Bone Density declined  Lung Cancer Screening: (Low Dose CT Chest recommended if Age 55-80 years, 30 pack-year currently smoking OR have quit w/in 15years.) does not qualify.   Lung Cancer Screening Referral:   Additional Screening:  Hepatitis C Screening: does not qualify; Completed 2021  Vision Screening: Recommended annual ophthalmology exams for early detection of glaucoma and other disorders of the eye. Is the patient up to date with their annual eye exam?  Yes  Who is the provider or what is the name of the office in which the patient attends annual eye exams? My eye doctor If pt is not established with a provider, would they like to be referred to a provider to establish care? No .   Dental Screening: Recommended annual dental exams for proper oral hygiene  Community  Resource Referral / Chronic Care Management: CRR required this visit?  No   CCM required this visit?  No      Plan:     I have personally reviewed and noted the following in the patient's chart:   Medical and social history Use of alcohol, tobacco or illicit drugs  Current medications and supplements including opioid prescriptions. Patient is not currently taking opioid prescriptions. Functional ability and status Nutritional status Physical activity Advanced directives List of other physicians Hospitalizations, surgeries, and ER visits in previous 12 months Vitals Screenings to include cognitive, depression, and falls Referrals and appointments  In addition, I have reviewed and discussed with patient certain preventive protocols, quality metrics, and best practice recommendations. A written personalized care plan for preventive services as well as general preventive health recommendations were provided to patient.     Leroy Kennedy, LPN   [id number]   Nurse Notes:

## 2022-05-09 NOTE — Patient Instructions (Signed)
Ellen [name] , Thank you for taking time to come for your Medicare Wellness Visit. I appreciate your ongoing commitment to your health goals. Please review the following plan we discussed and let me know if I can assist you in the future.   These are the goals we discussed:  Goals      DIET - EAT MORE FRUITS AND VEGETABLES     Increase water intake     Recommend drinking at least 5-6 glasses of water day      Patient Stated     04/26/2020, no goals     Patient Stated     Would like to gain some weight        This is a list of the screening recommended for you and due dates:  Health Maintenance  Topic Date Due   Complete foot exam   04/08/2022   COVID-19 Vaccine (6 - 2023-24 season) 05/25/2022*   Zoster (Shingles) Vaccine (1 of 2) 08/09/2022*   Flu Shot  09/17/2022*   Mammogram  05/10/2023*   Hemoglobin A1C  06/09/2022   Eye exam for diabetics  07/26/2022   Yearly kidney function blood test for diabetes  12/09/2022   Yearly kidney health urinalysis for diabetes  12/09/2022   Medicare Annual Wellness Visit  05/10/2023   Pneumonia Vaccine  Completed   DEXA scan (bone density measurement)  Completed   Hepatitis C Screening: USPSTF Recommendation to screen - Ages 34-79 yo.  Completed   HPV Vaccine  Aged Out   Colon Cancer Screening  Discontinued  *Topic was postponed. The date shown is not the original due date.    Advanced directives: yes not on file  Conditions/risks identified:   Next appointment: Follow up in one year for your annual wellness visit 05-24-2022 @ 3:40 [facility] 65 Years and Older, Female Preventive care refers to lifestyle choices and visits with your health care provider that can promote health and wellness. What does preventive care include? A yearly physical exam. This is also called an annual well check. Dental exams once or twice a year. Routine eye exams. Ask your health care provider how often you should have your eyes  checked. Personal lifestyle choices, including: Daily care of your teeth and gums. Regular physical activity. Eating a healthy diet. Avoiding tobacco and drug use. Limiting alcohol use. Practicing safe sex. Taking low-dose aspirin every day. Taking vitamin and mineral supplements as recommended by your health care provider. What happens during an annual well check? The services and screenings done by your health care provider during your annual well check will depend on your age, overall health, lifestyle risk factors, and family history of disease. Counseling  Your health care provider may ask you questions about your: Alcohol use. Tobacco use. Drug use. Emotional well-being. Home and relationship well-being. Sexual activity. Eating habits. History of falls. Memory and ability to understand (cognition). Work and work Astronomer. Reproductive health. Screening  You may have the following tests or measurements: Height, weight, and BMI. Blood pressure. Lipid and cholesterol levels. These may be checked every 5 years, or more frequently if you are over 20 years old. Skin check. Lung cancer screening. You may have this screening every year starting at age 79 if you have a 30-pack-year history of smoking and currently smoke or have quit within the past 15 years. Fecal occult blood test (FOBT) of the stool. You may have this test every year starting at age 79. Flexible sigmoidoscopy or colonoscopy. You  may have a sigmoidoscopy every 5 years or a colonoscopy every 10 years starting at age 79. Hepatitis C blood test. Hepatitis B blood test. Sexually transmitted disease (STD) testing. Diabetes screening. This is done by checking your blood sugar (glucose) after you have not eaten for a while (fasting). You may have this done every 1-3 years. Bone density scan. This is done to screen for osteoporosis. You may have this done starting at age 79. Mammogram. This may be done every 1-2  years. Talk to your health care provider about how often you should have regular mammograms. Talk with your health care provider about your test results, treatment options, and if necessary, the need for more tests. Vaccines  Your health care provider may recommend certain vaccines, such as: Influenza vaccine. This is recommended every year. Tetanus, diphtheria, and acellular pertussis (Tdap, Td) vaccine. You may need a Td booster every 10 years. Zoster vaccine. You may need this after age 79. Pneumococcal 13-valent conjugate (PCV13) vaccine. One dose is recommended after age 79. Pneumococcal polysaccharide (PPSV23) vaccine. One dose is recommended after age 79. Talk to your health care provider about which screenings and vaccines you need and how often you need them. This information is not intended to replace advice given to you by your health care provider. Make sure you discuss any questions you have with your health care provider. Document Released: 07/02/2015 Document Revised: 02/23/2016 Document Reviewed: 04/06/2015 Elsevier Interactive Patient Education  2017 [place] Prevention in the Home Falls can cause injuries. They can happen to people of all ages. There are many things you can do to make your home safe and to help prevent falls. What can I do on the outside of my home? Regularly fix the edges of walkways and driveways and fix any cracks. Remove anything that might make you trip as you walk through a door, such as a raised step or threshold. Trim any bushes or trees on the path to your home. Use bright outdoor lighting. Clear any walking paths of anything that might make someone trip, such as rocks or tools. Regularly check to see if handrails are loose or broken. Make sure that both sides of any steps have handrails. Any raised decks and porches should have guardrails on the edges. Have any leaves, snow, or ice cleared regularly. Use sand or salt on walking paths  during winter. Clean up any spills in your garage right away. This includes oil or grease spills. What can I do in the bathroom? Use night lights. Install grab bars by the toilet and in the tub and shower. Do not use towel bars as grab bars. Use non-skid mats or decals in the tub or shower. If you need to sit down in the shower, use a plastic, non-slip stool. Keep the floor dry. Clean up any water that spills on the floor as soon as it happens. Remove soap buildup in the tub or shower regularly. Attach bath mats securely with double-sided non-slip rug tape. Do not have throw rugs and other things on the floor that can make you trip. What can I do in the bedroom? Use night lights. Make sure that you have a light by your bed that is easy to reach. Do not use any sheets or blankets that are too big for your bed. They should not hang down onto the floor. Have a firm chair that has side arms. You can use this for support while you get dressed. Do not have throw  rugs and other things on the floor that can make you trip. What can I do in the kitchen? Clean up any spills right away. Avoid walking on wet floors. Keep items that you use a lot in easy-to-reach places. If you need to reach something above you, use a strong step stool that has a grab bar. Keep electrical cords out of the way. Do not use floor polish or wax that makes floors slippery. If you must use wax, use non-skid floor wax. Do not have throw rugs and other things on the floor that can make you trip. What can I do with my stairs? Do not leave any items on the stairs. Make sure that there are handrails on both sides of the stairs and use them. Fix handrails that are broken or loose. Make sure that handrails are as long as the stairways. Check any carpeting to make sure that it is firmly attached to the stairs. Fix any carpet that is loose or worn. Avoid having throw rugs at the top or bottom of the stairs. If you do have throw  rugs, attach them to the floor with carpet tape. Make sure that you have a light switch at the top of the stairs and the bottom of the stairs. If you do not have them, ask someone to add them for you. What else can I do to help prevent falls? Wear shoes that: Do not have high heels. Have rubber bottoms. Are comfortable and fit you well. Are closed at the toe. Do not wear sandals. If you use a stepladder: Make sure that it is fully opened. Do not climb a closed stepladder. Make sure that both sides of the stepladder are locked into place. Ask someone to hold it for you, if possible. Clearly mark and make sure that you can see: Any grab bars or handrails. First and last steps. Where the edge of each step is. Use tools that help you move around (mobility aids) if they are needed. These include: Canes. Walkers. Scooters. Crutches. Turn on the lights when you go into a dark area. Replace any light bulbs as soon as they burn out. Set up your furniture so you have a clear path. Avoid moving your furniture around. If any of your floors are uneven, fix them. If there are any pets around you, be aware of where they are. Review your medicines with your doctor. Some medicines can make you feel dizzy. This can increase your chance of falling. Ask your doctor what other things that you can do to help prevent falls. This information is not intended to replace advice given to you by your health care provider. Make sure you discuss any questions you have with your health care provider. Document Released: 04/01/2009 Document Revised: 11/11/2015 Document Reviewed: 07/10/2014 Elsevier Interactive Patient Education  2017 [employer].

## 2022-05-24 ENCOUNTER — Ambulatory Visit (INDEPENDENT_AMBULATORY_CARE_PROVIDER_SITE_OTHER): Payer: Medicare Other | Admitting: Nurse Practitioner

## 2022-05-24 ENCOUNTER — Encounter: Payer: Self-pay | Admitting: Nurse Practitioner

## 2022-05-24 VITALS — BP 134/71 | HR 103 | Temp 98.5°F | Ht 62.0 in | Wt 149.4 lb

## 2022-05-24 DIAGNOSIS — E1169 Type 2 diabetes mellitus with other specified complication: Secondary | ICD-10-CM

## 2022-05-24 DIAGNOSIS — E785 Hyperlipidemia, unspecified: Secondary | ICD-10-CM

## 2022-05-24 DIAGNOSIS — E1159 Type 2 diabetes mellitus with other circulatory complications: Secondary | ICD-10-CM

## 2022-05-24 DIAGNOSIS — Z6831 Body mass index (BMI) 31.0-31.9, adult: Secondary | ICD-10-CM

## 2022-05-24 DIAGNOSIS — R809 Proteinuria, unspecified: Secondary | ICD-10-CM | POA: Diagnosis not present

## 2022-05-24 DIAGNOSIS — E6609 Other obesity due to excess calories: Secondary | ICD-10-CM

## 2022-05-24 DIAGNOSIS — I152 Hypertension secondary to endocrine disorders: Secondary | ICD-10-CM | POA: Diagnosis not present

## 2022-05-24 DIAGNOSIS — E1129 Type 2 diabetes mellitus with other diabetic kidney complication: Secondary | ICD-10-CM

## 2022-05-24 DIAGNOSIS — I700 Atherosclerosis of aorta: Secondary | ICD-10-CM

## 2022-05-24 MED ORDER — METFORMIN HCL 500 MG PO TABS
ORAL_TABLET | ORAL | 1 refills | Status: DC
Start: 2022-05-24 — End: 2022-12-25

## 2022-05-24 MED ORDER — AMLODIPINE BESYLATE 10 MG PO TABS: 10.0000 mg | ORAL_TABLET | Freq: Every day | ORAL | 1 refills | Status: DC

## 2022-05-24 MED ORDER — CHLORTHALIDONE 25 MG PO TABS
25.0000 mg | ORAL_TABLET | Freq: Every day | ORAL | 1 refills | Status: DC
Start: 2022-05-24 — End: 2022-12-25

## 2022-05-24 MED ORDER — LISINOPRIL 40 MG PO TABS: 40.0000 mg | ORAL_TABLET | Freq: Every day | ORAL | 1 refills | Status: DC

## 2022-05-24 MED ORDER — ATORVASTATIN CALCIUM 20 MG PO TABS: 20.0000 mg | ORAL_TABLET | Freq: Every day | ORAL | 1 refills | Status: DC

## 2022-05-24 NOTE — Progress Notes (Signed)
BP 134/71   Pulse (!) 103   Temp 98.5 F (36.9 C) (Oral)   Ht _0  (1.575 m)   Wt 149 lb 6.4 oz (67.8 kg)   LMP 11/28/1994 (Approximate)   SpO2 97%   BMI 27.33 kg/m    Subjective:    Patient ID: Ellen [name], female    DOB: 09-17-1940, 81 y.o.   MRN: [id number]  HPI: Ellen [name] is a 81 y.o. female  Chief Complaint  Patient presents with   Diabetes   Hyperlipidemia   Hypertension   HYPERTENSION / [place] Satisfied with current treatment? no Duration of hypertension: years BP monitoring frequency: weekly BP range: 127-130/80 BP medication side effects: no Past BP meds: amlodipine and lisinopril Duration of hyperlipidemia: years Cholesterol medication side effects: no Cholesterol supplements: none Past cholesterol medications: atorvastain (lipitor) Medication compliance: excellent compliance Aspirin: yes Recent stressors: no Recurrent headaches: no Visual changes: no Palpitations: no Dyspnea: no Chest pain: no Lower extremity edema: no Dizzy/lightheaded: no  DIABETES Hypoglycemic episodes:no Polydipsia/polyuria: no Visual disturbance: no Chest pain: no Paresthesias: no Glucose Monitoring: no  Accucheck frequency: Not Checking  Fasting glucose:  Post prandial:  Evening:  Before meals: Taking Insulin?: no  Long acting insulin:  Short acting insulin: Blood Pressure Monitoring: weekly Retinal Examination: Not up to Date Foot Exam: Up to Date Diabetic Education: Not Completed Pneumovax: Up to Date Influenza: Up to Date Aspirin: yes     Relevant past medical, surgical, family and social history reviewed and updated as indicated. Interim medical history since our last visit reviewed. Allergies and medications reviewed and updated.  Review of Systems  Eyes:  Negative for visual disturbance.  Respiratory:  Negative for chest tightness and shortness of breath.   Cardiovascular:  Negative for chest pain, palpitations and leg  swelling.  Endocrine: Negative for polydipsia and polyuria.  Neurological:  Negative for dizziness, light-headedness, numbness and headaches.       Memory changes    Per HPI unless specifically indicated above     Objective:    BP 134/71   Pulse (!) 103   Temp 98.5 F (36.9 C) (Oral)   Ht _1  (1.575 m)   Wt 149 lb 6.4 oz (67.8 kg)   LMP 11/28/1994 (Approximate)   SpO2 97%   BMI 27.33 kg/m   Wt Readings from Last 3 Encounters:  05/24/22 149 lb 6.4 oz (67.8 kg)  04/12/22 150 lb (68 kg)  04/11/22 149 lb (67.6 kg)    Physical Exam Vitals and nursing note reviewed.  Constitutional:      General: She is not in acute distress.    Appearance: Normal appearance. She is normal weight. She is not ill-appearing, toxic-appearing or diaphoretic.  HENT:     Head: Normocephalic.     Right Ear: External ear normal.     Left Ear: External ear normal.     Nose: Nose normal.     Mouth/Throat:     Mouth: Mucous membranes are moist.     Pharynx: Oropharynx is clear.  Eyes:     General:        Right eye: No discharge.        Left eye: No discharge.     Extraocular Movements: Extraocular movements intact.     Conjunctiva/sclera: Conjunctivae normal.     Pupils: Pupils are equal, round, and reactive to light.  Cardiovascular:     Rate and Rhythm: Normal rate and regular rhythm.     Heart  sounds: No murmur heard. Pulmonary:     Effort: Pulmonary effort is normal. No respiratory distress.     Breath sounds: Normal breath sounds. No wheezing or rales.  Musculoskeletal:     Cervical back: Normal range of motion and neck supple.  Skin:    General: Skin is warm and dry.     Capillary Refill: Capillary refill takes less than 2 seconds.  Neurological:     General: No focal deficit present.     Mental Status: She is alert and oriented to person, place, and time. Mental status is at baseline.  Psychiatric:        Mood and Affect: Mood normal.        Behavior: Behavior normal.         Thought Content: Thought content normal.        Judgment: Judgment normal.     Results for orders placed or performed in visit on 12/07/21  HgB A1c  Result Value Ref Range   Hgb A1c MFr Bld 6.0 (H) 4.8 - 5.6 %   Est. average glucose Bld gHb Est-mCnc 126 mg/dL  Lipid Profile  Result Value Ref Range   Cholesterol, Total 140 100 - 199 mg/dL   Triglycerides 95 0 - 149 mg/dL   HDL 65 >39 mg/dL   VLDL Cholesterol Cal 18 5 - 40 mg/dL   LDL Chol Calc (NIH) 57 0 - 99 mg/dL   Chol/HDL Ratio 2.2 0.0 - 4.4 ratio  Comp Met (CMET)  Result Value Ref Range   Glucose 98 70 - 99 mg/dL   BUN 14 8 - 27 mg/dL   Creatinine, Ser 0.62 0.57 - 1.00 mg/dL   eGFR 91 >59 mL/min/1.73   BUN/Creatinine Ratio 23 12 - 28   Sodium 141 134 - 144 mmol/L   Potassium 3.5 3.5 - 5.2 mmol/L   Chloride 102 96 - 106 mmol/L   CO2 24 20 - 29 mmol/L   Calcium 9.6 8.7 - 10.3 mg/dL   Total Protein 6.7 6.0 - 8.5 g/dL   Albumin 4.2 3.7 - 4.7 g/dL   Globulin, Total 2.5 1.5 - 4.5 g/dL   Albumin/Globulin Ratio 1.7 1.2 - 2.2   Bilirubin Total 0.2 0.0 - 1.2 mg/dL   Alkaline Phosphatase 111 44 - 121 IU/L   AST 20 0 - 40 IU/L   ALT 11 0 - 32 IU/L  CBC w/Diff  Result Value Ref Range   WBC 5.2 3.4 - 10.8 x10E3/uL   RBC 4.22 3.77 - 5.28 x10E6/uL   Hemoglobin 11.7 11.1 - 15.9 g/dL   Hematocrit 37.2 34.0 - 46.6 %   MCV 88 79 - 97 fL   MCH 27.7 26.6 - 33.0 pg   MCHC 31.5 31.5 - 35.7 g/dL   RDW 13.9 11.7 - 15.4 %   Platelets 267 150 - 450 x10E3/uL   Neutrophils 60 Not Estab. %   Lymphs 27 Not Estab. %   Monocytes 8 Not Estab. %   Eos 4 Not Estab. %   Basos 1 Not Estab. %   Neutrophils Absolute 3.1 1.4 - 7.0 x10E3/uL   Lymphocytes Absolute 1.4 0.7 - 3.1 x10E3/uL   Monocytes Absolute 0.4 0.1 - 0.9 x10E3/uL   EOS (ABSOLUTE) 0.2 0.0 - 0.4 x10E3/uL   Basophils Absolute 0.1 0.0 - 0.2 x10E3/uL   Immature Granulocytes 0 Not Estab. %   Immature Grans (Abs) 0.0 0.0 - 0.1 x10E3/uL  Urine Microalbumin w/creat. ratio  Result Value  Ref Range  Creatinine, Urine 33.5 Not Estab. mg/dL   Microalbumin, Urine 13.0 Not Estab. ug/mL   Microalb/Creat Ratio 39 (H) 0 - 29 mg/g creat  B12  Result Value Ref Range   Vitamin B-12 >2000 (H) 232 - 1245 pg/mL      Assessment & Plan:   Problem List Items Addressed This Visit       Cardiovascular and Mediastinum   Hypertension associated with diabetes ([place])    Chronic.  Controlled.  Continue with current medication regimen of Lisinopril, hydralazine and Amlodipine.  Return to clinic in 3 months for reevaluation.  Call sooner if concerns arise.        Relevant Medications   amLODipine (NORVASC) 10 MG tablet   atorvastatin (LIPITOR) 20 MG tablet   chlorthalidone (HYGROTON) 25 MG tablet   lisinopril (ZESTRIL) 40 MG tablet   metFORMIN (GLUCOPHAGE) 500 MG tablet   Other Relevant Orders   Comp Met (CMET)   Atherosclerosis of aorta (HCC) - Primary    Chronic.  Controlled.  Continue with current medication regimen of Atorvastatin 49m.  Labs ordered today.  Return to clinic in 3 months for reevaluation.  Call sooner if concerns arise.       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  Last A1c was 6.0.  Labs ordered today.  Controlled on Metformin 5038mBID. Refills sent today. Will make recommendations based on lab results. Follow up in 3 months.  Call sooner if concerns arise.       Relevant Medications   atorvastatin (LIPITOR) 20 MG tablet   lisinopril (ZESTRIL) 40 MG tablet   metFORMIN (GLUCOPHAGE) 500 MG tablet   Other Relevant Orders   HgB A1c   Hyperlipidemia associated with type 2 diabetes mellitus (HCC)    Chronic.  Controlled.  Continue with current medication regimen of Atorvastatin 2046m Labs ordered today.  Return  to clinic in 3 months for reevaluation.  Call sooner if concerns arise.         Relevant Medications   amLODipine (NORVASC) 10 MG tablet   atorvastatin (LIPITOR) 20 MG tablet   chlorthalidone (HYGROTON) 25 MG tablet   lisinopril (ZESTRIL) 40 MG tablet   metFORMIN (GLUCOPHAGE) 500 MG tablet   Other Relevant Orders   Lipid Profile     Other   Obesity    Recommended eating smaller high protein, low fat meals more frequently and exercising 30 mins a day 5 times a week with a goal of 10-15lb weight loss in the next 3 months. Patient voiced their understanding and motivation to adhere to these recommendations.       Relevant Medications   metFORMIN (GLUCOPHAGE) 500 MG tablet     Follow up plan: Return in about 3 months (around 08/23/2022) for HTN, HLD, DM2 FU.

## 2022-05-24 NOTE — Assessment & Plan Note (Signed)
Chronic.  Controlled.  Continue with current medication regimen of Atorvastatin 20mg.  Labs ordered today.  Return to clinic in 3 months for reevaluation.  Call sooner if concerns arise.   

## 2022-05-24 NOTE — Assessment & Plan Note (Signed)
Recommended eating smaller high protein, low fat meals more frequently and exercising 30 mins a day 5 times a week with a goal of 10-15lb weight loss in the next 3 months. Patient voiced their understanding and motivation to adhere to these recommendations.  

## 2022-05-24 NOTE — Assessment & Plan Note (Signed)
Chronic.  Last A1c was 6.0.  Labs ordered today.  Controlled on Metformin 500mg  BID. Refills sent today. Will make recommendations based on lab results. Follow up in 3 months.  Call sooner if concerns arise.

## 2022-05-24 NOTE — Assessment & Plan Note (Signed)
Chronic.  Controlled.  Continue with current medication regimen of Lisinopril, hydralazine and Amlodipine.  Return to clinic in 3 months for reevaluation.  Call sooner if concerns arise.   

## 2022-05-25 LAB — LIPID PANEL
Chol/HDL Ratio: 2.8 ratio (ref 0.0–4.4)
Cholesterol, Total: 154 mg/dL (ref 100–199)
HDL: 56 mg/dL (ref >39)
LDL Chol Calc (NIH): 73 mg/dL (ref 0–99)
Triglycerides: 143 mg/dL (ref 0–149)
VLDL Cholesterol Cal: 25 mg/dL (ref 5–40)

## 2022-05-25 LAB — COMPREHENSIVE METABOLIC PANEL
ALT: 10 IU/L (ref 0–32)
AST: 17 IU/L (ref 0–40)
Albumin/Globulin Ratio: 1.5 (ref 1.2–2.2)
Albumin: 4.1 g/dL (ref 3.8–4.8)
Alkaline Phosphatase: 93 IU/L (ref 44–121)
BUN/Creatinine Ratio: 24 (ref 12–28)
BUN: 26 mg/dL (ref 8–27)
Bilirubin Total: <0.2 mg/dL (ref 0.0–1.2)
CO2: 24 mmol/L (ref 20–29)
Calcium: 9.4 mg/dL (ref 8.7–10.3)
Chloride: 100 mmol/L (ref 96–106)
Creatinine, Ser: 1.08 mg/dL — ABNORMAL HIGH (ref 0.57–1.00)
Globulin, Total: 2.8 g/dL (ref 1.5–4.5)
Glucose: 113 mg/dL — ABNORMAL HIGH (ref 70–99)
Potassium: 3.3 mmol/L — ABNORMAL LOW (ref 3.5–5.2)
Sodium: 140 mmol/L (ref 134–144)
Total Protein: 6.9 g/dL (ref 6.0–8.5)
eGFR: 53 mL/min/{1.73_m2} — ABNORMAL LOW (ref >59)

## 2022-05-25 LAB — HEMOGLOBIN A1C
Est. average glucose Bld gHb Est-mCnc: 123 mg/dL
Hgb A1c MFr Bld: 5.9 % — ABNORMAL HIGH (ref 4.8–5.6)

## 2022-05-25 NOTE — Addendum Note (Signed)
Addended by: Larae Grooms on: 05/25/2022 07:54 AM   Modules accepted: Orders

## 2022-05-25 NOTE — Progress Notes (Signed)
Please let patient know that her kidneys look like she was a little dehydrated.  I want her to increase her water intake.  Her potassium was slightly low.  I would like her to come back as a lab visit and repeat this next week.  A1c remains well controlled.

## 2022-05-31 ENCOUNTER — Other Ambulatory Visit: Payer: Medicare Other

## 2022-05-31 DIAGNOSIS — E1129 Type 2 diabetes mellitus with other diabetic kidney complication: Secondary | ICD-10-CM | POA: Diagnosis not present

## 2022-05-31 DIAGNOSIS — R809 Proteinuria, unspecified: Secondary | ICD-10-CM | POA: Diagnosis not present

## 2022-06-01 LAB — COMPREHENSIVE METABOLIC PANEL
ALT: 11 IU/L (ref 0–32)
AST: 17 IU/L (ref 0–40)
Albumin/Globulin Ratio: 1.8 (ref 1.2–2.2)
Albumin: 4.2 g/dL (ref 3.8–4.8)
Alkaline Phosphatase: 86 IU/L (ref 44–121)
BUN/Creatinine Ratio: 18 (ref 12–28)
BUN: 16 mg/dL (ref 8–27)
Bilirubin Total: <0.2 mg/dL (ref 0.0–1.2)
CO2: 27 mmol/L (ref 20–29)
Calcium: 9.7 mg/dL (ref 8.7–10.3)
Chloride: 99 mmol/L (ref 96–106)
Creatinine, Ser: 0.88 mg/dL (ref 0.57–1.00)
Globulin, Total: 2.3 g/dL (ref 1.5–4.5)
Glucose: 94 mg/dL (ref 70–99)
Potassium: 3.8 mmol/L (ref 3.5–5.2)
Sodium: 138 mmol/L (ref 134–144)
Total Protein: 6.5 g/dL (ref 6.0–8.5)
eGFR: 67 mL/min/{1.73_m2} (ref >59)

## 2022-06-01 NOTE — Progress Notes (Signed)
Please let patient know that all of her lab work has returned to normal.  I suspect the abnormality was related to some dehydration.  Make sure to drink lots of water!

## 2022-08-22 NOTE — Progress Notes (Unsigned)
LMP 11/28/1994 (Approximate)    Subjective:    Patient ID: Ellen [name], female    DOB: Dec 21, 1940, 82 y.o.   MRN: [id number]  HPI: Ellen [name] is a 82 y.o. female  No chief complaint on file.  HYPERTENSION / HYPERLIPIDEMIA Satisfied with current treatment? no Duration of hypertension: years BP monitoring frequency: weekly BP range: 127-130/80 BP medication side effects: no Past BP meds: amlodipine and lisinopril Duration of hyperlipidemia: years Cholesterol medication side effects: no Cholesterol supplements: none Past cholesterol medications: atorvastain (lipitor) Medication compliance: excellent compliance Aspirin: yes Recent stressors: no Recurrent headaches: no Visual changes: no Palpitations: no Dyspnea: no Chest pain: no Lower extremity edema: no Dizzy/lightheaded: no  DIABETES Hypoglycemic episodes:no Polydipsia/polyuria: no Visual disturbance: no Chest pain: no Paresthesias: no Glucose Monitoring: no  Accucheck frequency: Not Checking  Fasting glucose:  Post prandial:  Evening:  Before meals: Taking Insulin?: no  Long acting insulin:  Short acting insulin: Blood Pressure Monitoring: weekly Retinal Examination: Not up to Date Foot Exam: Up to Date Diabetic Education: Not Completed Pneumovax: Up to Date Influenza: Up to Date Aspirin: yes     Relevant past medical, surgical, family and social history reviewed and updated as indicated. Interim medical history since our last visit reviewed. Allergies and medications reviewed and updated.  Review of Systems  Eyes:  Negative for visual disturbance.  Respiratory:  Negative for chest tightness and shortness of breath.   Cardiovascular:  Negative for chest pain, palpitations and leg swelling.  Endocrine: Negative for polydipsia and polyuria.  Neurological:  Negative for dizziness, light-headedness, numbness and headaches.       Memory changes    Per HPI unless specifically indicated  above     Objective:    LMP 11/28/1994 (Approximate)   Wt Readings from Last 3 Encounters:  05/24/22 149 lb 6.4 oz (67.8 kg)  04/12/22 150 lb (68 kg)  04/11/22 149 lb (67.6 kg)    Physical Exam Vitals and nursing note reviewed.  Constitutional:      General: She is not in acute distress.    Appearance: Normal appearance. She is normal weight. She is not ill-appearing, toxic-appearing or diaphoretic.  HENT:     Head: Normocephalic.     Right Ear: External ear normal.     Left Ear: External ear normal.     Nose: Nose normal.     Mouth/Throat:     Mouth: Mucous membranes are moist.     Pharynx: Oropharynx is clear.  Eyes:     General:        Right eye: No discharge.        Left eye: No discharge.     Extraocular Movements: Extraocular movements intact.     Conjunctiva/sclera: Conjunctivae normal.     Pupils: Pupils are equal, round, and reactive to light.  Cardiovascular:     Rate and Rhythm: Normal rate and regular rhythm.     Heart sounds: No murmur heard. Pulmonary:     Effort: Pulmonary effort is normal. No respiratory distress.     Breath sounds: Normal breath sounds. No wheezing or rales.  Musculoskeletal:     Cervical back: Normal range of motion and neck supple.  Skin:    General: Skin is warm and dry.     Capillary Refill: Capillary refill takes less than 2 seconds.  Neurological:     General: No focal deficit present.     Mental Status: She is alert and oriented to person, place, and  time. Mental status is at baseline.  Psychiatric:        Mood and Affect: Mood normal.        Behavior: Behavior normal.        Thought Content: Thought content normal.        Judgment: Judgment normal.     Results for orders placed or performed in visit on 05/31/22  Comp Met (CMET)  Result Value Ref Range   Glucose 94 70 - 99 mg/dL   BUN 16 8 - 27 mg/dL   Creatinine, Ser 0.88 0.57 - 1.00 mg/dL   eGFR 67 >59 mL/min/1.73   BUN/Creatinine Ratio 18 12 - 28   Sodium 138 134  - 144 mmol/L   Potassium 3.8 3.5 - 5.2 mmol/L   Chloride 99 96 - 106 mmol/L   CO2 27 20 - 29 mmol/L   Calcium 9.7 8.7 - 10.3 mg/dL   Total Protein 6.5 6.0 - 8.5 g/dL   Albumin 4.2 3.8 - 4.8 g/dL   Globulin, Total 2.3 1.5 - 4.5 g/dL   Albumin/Globulin Ratio 1.8 1.2 - 2.2   Bilirubin Total <0.2 0.0 - 1.2 mg/dL   Alkaline Phosphatase 86 44 - 121 IU/L   AST 17 0 - 40 IU/L   ALT 11 0 - 32 IU/L      Assessment & Plan:   Problem List Items Addressed This Visit       Cardiovascular and Mediastinum   Hypertension associated with diabetes ([place]) - Primary   Atherosclerosis of aorta (HCC)     Endocrine   Type 2 diabetes mellitus with proteinuria (HCC)   Hyperlipidemia associated with type 2 diabetes mellitus ([place])     Other   Obesity     Follow up plan: No follow-ups on file.

## 2022-08-23 ENCOUNTER — Encounter: Payer: Self-pay | Admitting: Nurse Practitioner

## 2022-08-23 ENCOUNTER — Ambulatory Visit (INDEPENDENT_AMBULATORY_CARE_PROVIDER_SITE_OTHER): Payer: Medicare Other | Admitting: Nurse Practitioner

## 2022-08-23 VITALS — BP 130/80 | HR 80 | Temp 98.3°F | Wt 151.4 lb

## 2022-08-23 DIAGNOSIS — E785 Hyperlipidemia, unspecified: Secondary | ICD-10-CM | POA: Diagnosis not present

## 2022-08-23 DIAGNOSIS — E1129 Type 2 diabetes mellitus with other diabetic kidney complication: Secondary | ICD-10-CM | POA: Diagnosis not present

## 2022-08-23 DIAGNOSIS — I152 Hypertension secondary to endocrine disorders: Secondary | ICD-10-CM | POA: Diagnosis not present

## 2022-08-23 DIAGNOSIS — E1159 Type 2 diabetes mellitus with other circulatory complications: Secondary | ICD-10-CM

## 2022-08-23 DIAGNOSIS — E6609 Other obesity due to excess calories: Secondary | ICD-10-CM

## 2022-08-23 DIAGNOSIS — E1169 Type 2 diabetes mellitus with other specified complication: Secondary | ICD-10-CM

## 2022-08-23 DIAGNOSIS — Z6831 Body mass index (BMI) 31.0-31.9, adult: Secondary | ICD-10-CM

## 2022-08-23 DIAGNOSIS — I700 Atherosclerosis of aorta: Secondary | ICD-10-CM | POA: Diagnosis not present

## 2022-08-23 DIAGNOSIS — R809 Proteinuria, unspecified: Secondary | ICD-10-CM | POA: Diagnosis not present

## 2022-08-23 NOTE — Assessment & Plan Note (Signed)
Chronic.  Controlled.  Continue with current medication regimen of Atorvastatin 20mg.  Labs ordered today.  Return to clinic in 3 months for reevaluation.  Call sooner if concerns arise.   

## 2022-08-23 NOTE — Assessment & Plan Note (Signed)
Recommended eating smaller high protein, low fat meals more frequently and exercising 30 mins a day 5 times a week with a goal of 10-15lb weight loss in the next 3 months.  

## 2022-08-23 NOTE — Assessment & Plan Note (Signed)
Chronic.  Last A1c was 5.9%.  Labs ordered today.  Controlled on Metformin '500mg'$  BID. Doing well.  No currently checking sugars. Will make recommendations based on lab results. Follow up in 3 months.  Call sooner if concerns arise.

## 2022-08-23 NOTE — Assessment & Plan Note (Signed)
Chronic.  Controlled.  Continue with current medication regimen of Lisinopril, hydralazine and Amlodipine.  Return to clinic in 3 months for reevaluation.  Call sooner if concerns arise.

## 2022-08-24 LAB — COMPREHENSIVE METABOLIC PANEL
ALT: 14 IU/L (ref 0–32)
AST: 18 IU/L (ref 0–40)
Albumin/Globulin Ratio: 1.8 (ref 1.2–2.2)
Albumin: 4.3 g/dL (ref 3.8–4.8)
Alkaline Phosphatase: 72 IU/L (ref 44–121)
BUN/Creatinine Ratio: 20 (ref 12–28)
BUN: 17 mg/dL (ref 8–27)
Bilirubin Total: <0.2 mg/dL (ref 0.0–1.2)
CO2: 25 mmol/L (ref 20–29)
Calcium: 9.7 mg/dL (ref 8.7–10.3)
Chloride: 100 mmol/L (ref 96–106)
Creatinine, Ser: 0.87 mg/dL (ref 0.57–1.00)
Globulin, Total: 2.4 g/dL (ref 1.5–4.5)
Glucose: 76 mg/dL (ref 70–99)
Potassium: 3.3 mmol/L — ABNORMAL LOW (ref 3.5–5.2)
Sodium: 141 mmol/L (ref 134–144)
Total Protein: 6.7 g/dL (ref 6.0–8.5)
eGFR: 68 mL/min/{1.73_m2} (ref >59)

## 2022-08-24 LAB — LIPID PANEL
Chol/HDL Ratio: 2.4 ratio (ref 0.0–4.4)
Cholesterol, Total: 151 mg/dL (ref 100–199)
HDL: 62 mg/dL (ref >39)
LDL Chol Calc (NIH): 64 mg/dL (ref 0–99)
Triglycerides: 148 mg/dL (ref 0–149)
VLDL Cholesterol Cal: 25 mg/dL (ref 5–40)

## 2022-08-24 LAB — HEMOGLOBIN A1C
Est. average glucose Bld gHb Est-mCnc: 131 mg/dL
Hgb A1c MFr Bld: 6.2 % — ABNORMAL HIGH (ref 4.8–5.6)

## 2022-08-24 NOTE — Progress Notes (Signed)
Please let patient know that her lab work looks good.  Your A1c is still well controlled at 6.2.  Her cholesterol is well controlled.  He potassium is a little but low so I recommend eating a banana or orange a day to help with her potassium.

## 2022-08-28 ENCOUNTER — Ambulatory Visit (INDEPENDENT_AMBULATORY_CARE_PROVIDER_SITE_OTHER): Payer: Medicare Other | Admitting: Nurse Practitioner

## 2022-08-28 ENCOUNTER — Ambulatory Visit: Payer: Self-pay

## 2022-08-28 ENCOUNTER — Ambulatory Visit: Payer: Medicare Other | Admitting: Nurse Practitioner

## 2022-08-28 ENCOUNTER — Encounter: Payer: Self-pay | Admitting: Nurse Practitioner

## 2022-08-28 VITALS — BP 147/68 | HR 102 | Temp 100.1°F | Wt 151.8 lb

## 2022-08-28 DIAGNOSIS — R300 Dysuria: Secondary | ICD-10-CM

## 2022-08-28 DIAGNOSIS — N3000 Acute cystitis without hematuria: Secondary | ICD-10-CM

## 2022-08-28 LAB — URINALYSIS, ROUTINE W REFLEX MICROSCOPIC
Bilirubin, UA: NEGATIVE
Glucose, UA: NEGATIVE
Ketones, UA: NEGATIVE
Nitrite, UA: POSITIVE — AB
Protein,UA: NEGATIVE
RBC, UA: NEGATIVE
Specific Gravity, UA: <1.005 — ABNORMAL LOW (ref 1.005–1.030)
Urobilinogen, Ur: 0.2 mg/dL (ref 0.2–1.0)
pH, UA: 5.5 (ref 5.0–7.5)

## 2022-08-28 LAB — MICROSCOPIC EXAMINATION

## 2022-08-28 MED ORDER — CIPROFLOXACIN HCL 500 MG PO TABS: 500.0000 mg | ORAL_TABLET | Freq: Two times a day (BID) | ORAL | 0 refills | Status: DC

## 2022-08-28 NOTE — Telephone Encounter (Signed)
  Chief Complaint: Chills, low fever, shaking Symptoms: above Frequency: yesterday Pertinent Negatives: Patient denies current S/s Disposition: [] ED /[] Urgent Care (no appt availability in office) / [x] Appointment(In office/virtual)/ []  [facility]/ [] Home Care/ [] Refused Recommended Disposition /[] [facility]/ []  Follow-up with PCP Additional Notes: Call from daughter Levada Dy. Pt had fever of 99.4, with chills and shivering yesterday. Also had 1 elevated BP reading. Daughter thinks this is a UTI or COVID. Pt will have  VV appointment this morning. Daughter wants PT tested.    Answer Assessment - Initial Assessment Questions 1. TEMPERATURE: "What is the most recent temperature?"  "How was it measured?"      99.4 2. ONSET: "When did the fever start?"      yesterday 3. CHILLS: "Do you have chills?" If yes: "How bad are they?"  (e.g., none, mild, moderate, severe)   - NONE: no chills   - MILD: feeling cold   - MODERATE: feeling very cold, some shivering (feels better under a thick blanket)   - SEVERE: feeling extremely cold with shaking chills (general body shaking, rigors; even under a thick blanket)      moderate 4. OTHER SYMPTOMS: "Do you have any other symptoms besides the fever?"  (e.g., abdomen pain, cough, diarrhea, earache, headache, sore throat, urination pain)      5. CAUSE: If there are no symptoms, ask: "What do you think is causing the fever?"      UTI or COVID 6. CONTACTS: "Does anyone else in the family have an infection?"      7. TREATMENT: "What have you done so far to treat this fever?" (e.g., medications)      8. IMMUNOCOMPROMISE: "Do you have of the following: diabetes, HIV positive, splenectomy, cancer chemotherapy, chronic steroid treatment, transplant patient, etc."  Protocols used: [facility]

## 2022-08-28 NOTE — Progress Notes (Signed)
BP (!) 147/68   Pulse (!) 102   Temp 100.1 F (37.8 C) (Oral)   Wt 151 lb 12.8 oz (68.9 kg)   LMP 11/28/1994 (Approximate)   SpO2 97%   BMI 27.76 kg/m    Subjective:    Patient ID: Ellen [name], female    DOB: [birth year], 82 y.o.   MRN: [id number]  HPI: Ellen [name] is a 82 y.o. female  Chief Complaint  Patient presents with   Fever    Pt states yesterday morning she woke up and had a shaking episode in her R hand.    UPPER RESPIRATORY TRACT INFECTION Worst symptom: Patient states after breakfast yesterday and got a chill.  It lasted about 10-34mns.  States her right hand was shaking.   Fever: yes Cough: no Shortness of breath: no Wheezing: no Chest pain: no Chest tightness: no Chest congestion: no Nasal congestion: no Runny nose: no Post nasal drip: no Sneezing: no Sore throat: no Swollen glands: no Sinus pressure: no Headache: yes Face pain: no Toothache: no Ear pain: no bilateral Ear pressure: no bilateral Eyes red/itching:no Eye drainage/crusting: no  Vomiting: no Rash: no Fatigue: yes Sick contacts: no Strep contacts: no  Context: stable Recurrent sinusitis: no Relief with OTC cold/cough medications: no  Treatments attempted: none   Relevant past medical, surgical, family and social history reviewed and updated as indicated. Interim medical history since our last visit reviewed. Allergies and medications reviewed and updated.  Review of Systems  Constitutional:  Positive for fatigue and fever.  HENT:  Negative for congestion, dental problem, ear pain, postnasal drip, rhinorrhea, sinus pressure, sinus pain, sneezing and sore throat.   Respiratory:  Negative for cough, shortness of breath and wheezing.   Cardiovascular:  Negative for chest pain.  Gastrointestinal:  Negative for vomiting.  Skin:  Negative for rash.  Neurological:  Positive for headaches.    Per HPI unless specifically indicated above     Objective:    BP (!)  147/68   Pulse (!) 102   Temp 100.1 F (37.8 C) (Oral)   Wt 151 lb 12.8 oz (68.9 kg)   LMP 11/28/1994 (Approximate)   SpO2 97%   BMI 27.76 kg/m   Wt Readings from Last 3 Encounters:  08/28/22 151 lb 12.8 oz (68.9 kg)  08/23/22 151 lb 6.4 oz (68.7 kg)  05/24/22 149 lb 6.4 oz (67.8 kg)    Physical Exam Vitals and nursing note reviewed.  Constitutional:      General: She is not in acute distress.    Appearance: Normal appearance. She is normal weight. She is not ill-appearing, toxic-appearing or diaphoretic.  HENT:     Head: Normocephalic.     Right Ear: Tympanic membrane and external ear normal.     Left Ear: Tympanic membrane and external ear normal.     Nose: Nose normal. No congestion or rhinorrhea.     Mouth/Throat:     Mouth: Mucous membranes are moist.     Pharynx: Oropharynx is clear. No oropharyngeal exudate or posterior oropharyngeal erythema.  Eyes:     General:        Right eye: No discharge.        Left eye: No discharge.     Extraocular Movements: Extraocular movements intact.     Conjunctiva/sclera: Conjunctivae normal.     Pupils: Pupils are equal, round, and reactive to light.  Cardiovascular:     Rate and Rhythm: Regular rhythm. Tachycardia present.  Heart sounds: No murmur heard. Pulmonary:     Effort: Pulmonary effort is normal. No respiratory distress.     Breath sounds: Normal breath sounds. No wheezing or rales.  Musculoskeletal:     Cervical back: Normal range of motion and neck supple.  Skin:    General: Skin is warm and dry.     Capillary Refill: Capillary refill takes less than 2 seconds.  Neurological:     General: No focal deficit present.     Mental Status: She is alert and oriented to person, place, and time. Mental status is at baseline.  Psychiatric:        Mood and Affect: Mood normal.        Behavior: Behavior normal.        Thought Content: Thought content normal.        Judgment: Judgment normal.     Results for orders  placed or performed in visit on 08/23/22  Comp Met (CMET)  Result Value Ref Range   Glucose 76 70 - 99 mg/dL   BUN 17 8 - 27 mg/dL   Creatinine, Ser 0.87 0.57 - 1.00 mg/dL   eGFR 68 >59 mL/min/1.73   BUN/Creatinine Ratio 20 12 - 28   Sodium 141 134 - 144 mmol/L   Potassium 3.3 (L) 3.5 - 5.2 mmol/L   Chloride 100 96 - 106 mmol/L   CO2 25 20 - 29 mmol/L   Calcium 9.7 8.7 - 10.3 mg/dL   Total Protein 6.7 6.0 - 8.5 g/dL   Albumin 4.3 3.8 - 4.8 g/dL   Globulin, Total 2.4 1.5 - 4.5 g/dL   Albumin/Globulin Ratio 1.8 1.2 - 2.2   Bilirubin Total <0.2 0.0 - 1.2 mg/dL   Alkaline Phosphatase 72 44 - 121 IU/L   AST 18 0 - 40 IU/L   ALT 14 0 - 32 IU/L  Lipid Profile  Result Value Ref Range   Cholesterol, Total 151 100 - 199 mg/dL   Triglycerides 148 0 - 149 mg/dL   HDL 62 >39 mg/dL   VLDL Cholesterol Cal 25 5 - 40 mg/dL   LDL Chol Calc (NIH) 64 0 - 99 mg/dL   Chol/HDL Ratio 2.4 0.0 - 4.4 ratio  HgB A1c  Result Value Ref Range   Hgb A1c MFr Bld 6.2 (H) 4.8 - 5.6 %   Est. average glucose Bld gHb Est-mCnc 131 mg/dL      Assessment & Plan:   Problem List Items Addressed This Visit   None Visit Diagnoses     Acute cystitis without hematuria    -  Primary   UA positive for Nitrites and Leukocytes. Will treat with Cipro BID x 7 days.  CMP and CBC checked at visit today.  Follow up in 3 days.   Relevant Medications   ciprofloxacin (CIPRO) 500 MG tablet   Other Relevant Orders   Urine Culture   Comp Met (CMET)   CBC w/Diff   Dysuria       Relevant Orders   Urinalysis, Routine w reflex microscopic        Follow up plan: Return in about 3 days (around 08/31/2022) for UTI check.

## 2022-08-29 LAB — COMPREHENSIVE METABOLIC PANEL
ALT: 11 IU/L (ref 0–32)
AST: 18 IU/L (ref 0–40)
Albumin/Globulin Ratio: 1.7 (ref 1.2–2.2)
Albumin: 4.3 g/dL (ref 3.8–4.8)
Alkaline Phosphatase: 73 IU/L (ref 44–121)
BUN/Creatinine Ratio: 24 (ref 12–28)
BUN: 23 mg/dL (ref 8–27)
Bilirubin Total: 0.3 mg/dL (ref 0.0–1.2)
CO2: 25 mmol/L (ref 20–29)
Calcium: 9.3 mg/dL (ref 8.7–10.3)
Chloride: 94 mmol/L — ABNORMAL LOW (ref 96–106)
Creatinine, Ser: 0.94 mg/dL (ref 0.57–1.00)
Globulin, Total: 2.5 g/dL (ref 1.5–4.5)
Glucose: 86 mg/dL (ref 70–99)
Potassium: 3.3 mmol/L — ABNORMAL LOW (ref 3.5–5.2)
Sodium: 133 mmol/L — ABNORMAL LOW (ref 134–144)
Total Protein: 6.8 g/dL (ref 6.0–8.5)
eGFR: 62 mL/min/{1.73_m2} (ref >59)

## 2022-08-29 LAB — CBC WITH DIFFERENTIAL/PLATELET
Basophils Absolute: 0 10*3/uL (ref 0.0–0.2)
Basos: 1 %
EOS (ABSOLUTE): 0.1 10*3/uL (ref 0.0–0.4)
Eos: 1 %
Hematocrit: 34.3 % (ref 34.0–46.6)
Hemoglobin: 11.3 g/dL (ref 11.1–15.9)
Immature Grans (Abs): 0 10*3/uL (ref 0.0–0.1)
Immature Granulocytes: 0 %
Lymphocytes Absolute: 1.3 10*3/uL (ref 0.7–3.1)
Lymphs: 16 %
MCH: 28 pg (ref 26.6–33.0)
MCHC: 32.9 g/dL (ref 31.5–35.7)
MCV: 85 fL (ref 79–97)
Monocytes Absolute: 0.8 10*3/uL (ref 0.1–0.9)
Monocytes: 10 %
Neutrophils Absolute: 5.7 10*3/uL (ref 1.4–7.0)
Neutrophils: 72 %
Platelets: 227 10*3/uL (ref 150–450)
RBC: 4.04 x10E6/uL (ref 3.77–5.28)
RDW: 13.3 % (ref 11.7–15.4)
WBC: 7.9 10*3/uL (ref 3.4–10.8)

## 2022-08-29 MED ORDER — POTASSIUM CHLORIDE CRYS ER 10 MEQ PO TBCR: 10.0000 meq | EXTENDED_RELEASE_TABLET | Freq: Every day | ORAL | 0 refills | Status: DC

## 2022-08-29 NOTE — Addendum Note (Signed)
Addended by: Jon Billings on: 08/29/2022 08:54 AM   Modules accepted: Orders

## 2022-08-29 NOTE — Progress Notes (Signed)
Please let patient know that overall her lab work looks great.  No sign of kidney infection.  Her potassium is still a little low.  I have sent a prescription for potassium supplement to the pharmacy for patient to take x 3 days.  We will see what her labs say when she returns this week.

## 2022-08-30 LAB — URINE CULTURE

## 2022-08-31 ENCOUNTER — Ambulatory Visit: Payer: Medicare Other | Admitting: Physician Assistant

## 2022-08-31 ENCOUNTER — Encounter: Payer: Self-pay | Admitting: Nurse Practitioner

## 2022-08-31 ENCOUNTER — Ambulatory Visit (INDEPENDENT_AMBULATORY_CARE_PROVIDER_SITE_OTHER): Payer: Medicare Other | Admitting: Nurse Practitioner

## 2022-08-31 VITALS — BP 125/67 | HR 83 | Temp 98.2°F | Wt 151.0 lb

## 2022-08-31 DIAGNOSIS — E876 Hypokalemia: Secondary | ICD-10-CM | POA: Diagnosis not present

## 2022-08-31 DIAGNOSIS — N3000 Acute cystitis without hematuria: Secondary | ICD-10-CM | POA: Diagnosis not present

## 2022-08-31 NOTE — Progress Notes (Signed)
BP 125/67   Pulse 83   Temp 98.2 F (36.8 C) (Oral)   Wt 151 lb (68.5 kg)   LMP 11/28/1994 (Approximate)   SpO2 99%   BMI 27.62 kg/m    Subjective:    Patient ID: Ellen [name], female    DOB: March 19, 1941, 82 y.o.   MRN: [id number]  HPI: Ellen [name] is a 82 y.o. female  Chief Complaint  Patient presents with   Urinary Tract Infection   Patient seen today to follow up on UTI.  She is doing much better today.  No longer having headaches, fatigue or fever.    Relevant past medical, surgical, family and social history reviewed and updated as indicated. Interim medical history since our last visit reviewed. Allergies and medications reviewed and updated.  Review of Systems  Constitutional:  Negative for fatigue and fever.  Neurological:  Negative for headaches.    Per HPI unless specifically indicated above     Objective:    BP 125/67   Pulse 83   Temp 98.2 F (36.8 C) (Oral)   Wt 151 lb (68.5 kg)   LMP 11/28/1994 (Approximate)   SpO2 99%   BMI 27.62 kg/m   Wt Readings from Last 3 Encounters:  08/31/22 151 lb (68.5 kg)  08/28/22 151 lb 12.8 oz (68.9 kg)  08/23/22 151 lb 6.4 oz (68.7 kg)    Physical Exam Vitals and nursing note reviewed.  Constitutional:      General: She is not in acute distress.    Appearance: Normal appearance. She is normal weight. She is not ill-appearing, toxic-appearing or diaphoretic.  HENT:     Head: Normocephalic.     Right Ear: Tympanic membrane and external ear normal.     Left Ear: Tympanic membrane and external ear normal.     Nose: Nose normal. No congestion or rhinorrhea.     Mouth/Throat:     Mouth: Mucous membranes are moist.     Pharynx: Oropharynx is clear. No oropharyngeal exudate or posterior oropharyngeal erythema.  Eyes:     General:        Right eye: No discharge.        Left eye: No discharge.     Extraocular Movements: Extraocular movements intact.     Conjunctiva/sclera: Conjunctivae normal.      Pupils: Pupils are equal, round, and reactive to light.  Cardiovascular:     Rate and Rhythm: Regular rhythm. Tachycardia present.     Heart sounds: No murmur heard. Pulmonary:     Effort: Pulmonary effort is normal. No respiratory distress.     Breath sounds: Normal breath sounds. No wheezing or rales.  Musculoskeletal:     Cervical back: Normal range of motion and neck supple.  Skin:    General: Skin is warm and dry.     Capillary Refill: Capillary refill takes less than 2 seconds.  Neurological:     General: No focal deficit present.     Mental Status: She is alert and oriented to person, place, and time. Mental status is at baseline.  Psychiatric:        Mood and Affect: Mood normal.        Behavior: Behavior normal.        Thought Content: Thought content normal.        Judgment: Judgment normal.     Results for orders placed or performed in visit on 08/28/22  Urine Culture   Specimen: Urine   UR  Result  Value Ref Range   Urine Culture, Routine Final report (A)    Organism ID, Bacteria Escherichia coli (A)    Antimicrobial Susceptibility Comment   Microscopic Examination   Urine  Result Value Ref Range   WBC, UA 6-10 (A) 0 - 5 /hpf   RBC, Urine 0-2 0 - 2 /hpf   Epithelial Cells (non renal) 0-10 0 - 10 /hpf   Bacteria, UA Many (A) None seen/Few  Urinalysis, Routine w reflex microscopic  Result Value Ref Range   Specific Gravity, UA <1.005 (L) 1.005 - 1.030   pH, UA 5.5 5.0 - 7.5   Color, UA Yellow Yellow   Appearance Ur Cloudy (A) Clear   Leukocytes,UA 1+ (A) Negative   Protein,UA Negative Negative/Trace   Glucose, UA Negative Negative   Ketones, UA Negative Negative   RBC, UA Negative Negative   Bilirubin, UA Negative Negative   Urobilinogen, Ur 0.2 0.2 - 1.0 mg/dL   Nitrite, UA Positive (A) Negative   Microscopic Examination See below:   Comp Met (CMET)  Result Value Ref Range   Glucose 86 70 - 99 mg/dL   BUN 23 8 - 27 mg/dL   Creatinine, Ser 0.94 0.57 -  1.00 mg/dL   eGFR 62 >59 mL/min/1.73   BUN/Creatinine Ratio 24 12 - 28   Sodium 133 (L) 134 - 144 mmol/L   Potassium 3.3 (L) 3.5 - 5.2 mmol/L   Chloride 94 (L) 96 - 106 mmol/L   CO2 25 20 - 29 mmol/L   Calcium 9.3 8.7 - 10.3 mg/dL   Total Protein 6.8 6.0 - 8.5 g/dL   Albumin 4.3 3.8 - 4.8 g/dL   Globulin, Total 2.5 1.5 - 4.5 g/dL   Albumin/Globulin Ratio 1.7 1.2 - 2.2   Bilirubin Total 0.3 0.0 - 1.2 mg/dL   Alkaline Phosphatase 73 44 - 121 IU/L   AST 18 0 - 40 IU/L   ALT 11 0 - 32 IU/L  CBC w/Diff  Result Value Ref Range   WBC 7.9 3.4 - 10.8 x10E3/uL   RBC 4.04 3.77 - 5.28 x10E6/uL   Hemoglobin 11.3 11.1 - 15.9 g/dL   Hematocrit 34.3 34.0 - 46.6 %   MCV 85 79 - 97 fL   MCH 28.0 26.6 - 33.0 pg   MCHC 32.9 31.5 - 35.7 g/dL   RDW 13.3 11.7 - 15.4 %   Platelets 227 150 - 450 x10E3/uL   Neutrophils 72 Not Estab. %   Lymphs 16 Not Estab. %   Monocytes 10 Not Estab. %   Eos 1 Not Estab. %   Basos 1 Not Estab. %   Neutrophils Absolute 5.7 1.4 - 7.0 x10E3/uL   Lymphocytes Absolute 1.3 0.7 - 3.1 x10E3/uL   Monocytes Absolute 0.8 0.1 - 0.9 x10E3/uL   EOS (ABSOLUTE) 0.1 0.0 - 0.4 x10E3/uL   Basophils Absolute 0.0 0.0 - 0.2 x10E3/uL   Immature Granulocytes 0 Not Estab. %   Immature Grans (Abs) 0.0 0.0 - 0.1 x10E3/uL      Assessment & Plan:   Problem List Items Addressed This Visit   None Visit Diagnoses     Acute cystitis without hematuria    -  Primary   Completed course of Ciprofloxacin. Return in 1 week for repeat UA to make sure UTI has cleared.   Relevant Orders   Urinalysis, Routine w reflex microscopic   Hypokalemia       CMP repeated at visit today.  Completed 3 doses of potassium.  Relevant Orders   Comp Met (CMET)        Follow up plan: Return if symptoms worsen or fail to improve.

## 2022-08-31 NOTE — Progress Notes (Signed)
Results discussed with patient during visit today.

## 2022-09-01 LAB — COMPREHENSIVE METABOLIC PANEL
ALT: 15 IU/L (ref 0–32)
AST: 23 IU/L (ref 0–40)
Albumin/Globulin Ratio: 1.6 (ref 1.2–2.2)
Albumin: 4.1 g/dL (ref 3.8–4.8)
Alkaline Phosphatase: 69 IU/L (ref 44–121)
BUN/Creatinine Ratio: 19 (ref 12–28)
BUN: 19 mg/dL (ref 8–27)
Bilirubin Total: <0.2 mg/dL (ref 0.0–1.2)
CO2: 24 mmol/L (ref 20–29)
Calcium: 9.6 mg/dL (ref 8.7–10.3)
Chloride: 92 mmol/L — ABNORMAL LOW (ref 96–106)
Creatinine, Ser: 0.99 mg/dL (ref 0.57–1.00)
Globulin, Total: 2.6 g/dL (ref 1.5–4.5)
Glucose: 105 mg/dL — ABNORMAL HIGH (ref 70–99)
Potassium: 3.6 mmol/L (ref 3.5–5.2)
Sodium: 133 mmol/L — ABNORMAL LOW (ref 134–144)
Total Protein: 6.7 g/dL (ref 6.0–8.5)
eGFR: 58 mL/min/{1.73_m2} — ABNORMAL LOW (ref >59)

## 2022-09-01 NOTE — Progress Notes (Signed)
Please let patient know that her potassium is back within normal range.  Her lab work does show that she needs to drink more water.  Please emphasize to her that increasing her water intake will also help prevent UTIs in the future.

## 2022-09-07 ENCOUNTER — Encounter: Payer: Self-pay | Admitting: Nurse Practitioner

## 2022-09-07 ENCOUNTER — Other Ambulatory Visit: Payer: Medicare Other

## 2022-09-07 DIAGNOSIS — N3000 Acute cystitis without hematuria: Secondary | ICD-10-CM

## 2022-09-07 LAB — URINALYSIS, ROUTINE W REFLEX MICROSCOPIC
Bilirubin, UA: NEGATIVE
Glucose, UA: NEGATIVE
Ketones, UA: NEGATIVE
Nitrite, UA: NEGATIVE
Protein,UA: NEGATIVE
RBC, UA: NEGATIVE
Specific Gravity, UA: <1.005 — ABNORMAL LOW (ref 1.005–1.030)
Urobilinogen, Ur: 0.2 mg/dL (ref 0.2–1.0)
pH, UA: 5.5 (ref 5.0–7.5)

## 2022-09-07 LAB — MICROSCOPIC EXAMINATION: RBC, Urine: NONE SEEN /hpf (ref 0–2)

## 2022-09-10 LAB — URINE CULTURE

## 2022-09-11 MED ORDER — SULFAMETHOXAZOLE-TRIMETHOPRIM 800-160 MG PO TABS: 1.0000 | ORAL_TABLET | Freq: Two times a day (BID) | ORAL | 0 refills | Status: DC

## 2022-09-11 NOTE — Progress Notes (Signed)
Please let patient know that the antibiotic treatment she completed did not resolve the bacteria.  I have sent in a different antibiotic this time to treat the infection.  It will be Bactrim twice daily for 5 days.

## 2022-11-23 ENCOUNTER — Encounter: Payer: Self-pay | Admitting: Nurse Practitioner

## 2022-11-23 ENCOUNTER — Ambulatory Visit (INDEPENDENT_AMBULATORY_CARE_PROVIDER_SITE_OTHER): Payer: Medicare Other | Admitting: Nurse Practitioner

## 2022-11-23 VITALS — BP 161/83 | HR 71 | Temp 97.6°F | Wt 154.0 lb

## 2022-11-23 DIAGNOSIS — I152 Hypertension secondary to endocrine disorders: Secondary | ICD-10-CM | POA: Diagnosis not present

## 2022-11-23 DIAGNOSIS — E785 Hyperlipidemia, unspecified: Secondary | ICD-10-CM

## 2022-11-23 DIAGNOSIS — R809 Proteinuria, unspecified: Secondary | ICD-10-CM

## 2022-11-23 DIAGNOSIS — E1129 Type 2 diabetes mellitus with other diabetic kidney complication: Secondary | ICD-10-CM | POA: Diagnosis not present

## 2022-11-23 DIAGNOSIS — E1159 Type 2 diabetes mellitus with other circulatory complications: Secondary | ICD-10-CM

## 2022-11-23 DIAGNOSIS — Z780 Asymptomatic menopausal state: Secondary | ICD-10-CM | POA: Diagnosis not present

## 2022-11-23 DIAGNOSIS — E1169 Type 2 diabetes mellitus with other specified complication: Secondary | ICD-10-CM | POA: Diagnosis not present

## 2022-11-23 DIAGNOSIS — I700 Atherosclerosis of aorta: Secondary | ICD-10-CM

## 2022-11-23 DIAGNOSIS — Z7984 Long term (current) use of oral hypoglycemic drugs: Secondary | ICD-10-CM | POA: Diagnosis not present

## 2022-11-23 DIAGNOSIS — R8290 Unspecified abnormal findings in urine: Secondary | ICD-10-CM

## 2022-11-23 LAB — URINALYSIS, ROUTINE W REFLEX MICROSCOPIC
Bilirubin, UA: NEGATIVE
Glucose, UA: NEGATIVE
Ketones, UA: NEGATIVE
Nitrite, UA: NEGATIVE
Protein,UA: NEGATIVE
RBC, UA: NEGATIVE
Specific Gravity, UA: 1.01 (ref 1.005–1.030)
Urobilinogen, Ur: 0.2 mg/dL (ref 0.2–1.0)
pH, UA: 7 (ref 5.0–7.5)

## 2022-11-23 LAB — MICROSCOPIC EXAMINATION

## 2022-11-23 NOTE — Addendum Note (Signed)
Addended by: Larae Grooms on: 11/23/2022 11:57 AM   Modules accepted: Orders

## 2022-11-23 NOTE — Assessment & Plan Note (Signed)
Will repeat UA.  Discussed with patient and daughter who was on the phone about high incidence of asymptomatic bacteremia.

## 2022-11-23 NOTE — Progress Notes (Signed)
BP (!) 161/83   Pulse 71   Temp 97.6 F (36.4 C) (Oral)   Wt 154 lb (69.9 kg)   LMP 11/28/1994 (Approximate)   SpO2 99%   BMI 28.17 kg/m    Subjective:    Patient ID: Ellen [name], female    DOB: 02-01-1941, 82 y.o.   MRN: [id number]  HPI: Ellen [name] is a 82 y.o. female  Chief Complaint  Patient presents with   Hypertension   Hyperlipidemia   Diabetes    Pt states she will schedule her eye exam soon.   HYPERTENSION / HYPERLIPIDEMIA Doing well with medications.   Satisfied with current treatment? no Duration of hypertension: years BP monitoring frequency: not checking BP range:  BP medication side effects: no Past BP meds: amlodipine and lisinopril Duration of hyperlipidemia: years Cholesterol medication side effects: no Cholesterol supplements: none Past cholesterol medications: atorvastain (lipitor) Medication compliance: excellent compliance Aspirin: yes Recent stressors: no Recurrent headaches: no Visual changes: no Palpitations: no Dyspnea: no Chest pain: no Lower extremity edema: no Dizzy/lightheaded: no  DIABETES Tolerating her Metformin well.   Hypoglycemic episodes:no Polydipsia/polyuria: no Visual disturbance: no Chest pain: no Paresthesias: no Glucose Monitoring: no  Accucheck frequency: checking some but doesn't remember the numbers.   Fasting glucose:  Post prandial:  Evening:  Before meals: Taking Insulin?: no  Long acting insulin:  Short acting insulin: Blood Pressure Monitoring: weekly Retinal Examination: Not up to Date- plans to make an appt Foot Exam: Up to Date Diabetic Education: Not Completed Pneumovax: Up to Date Influenza: Up to Date Aspirin: yes  Patient not currently having any symptoms of UTI.     Relevant past medical, surgical, family and social history reviewed and updated as indicated. Interim medical history since our last visit reviewed. Allergies and medications reviewed and updated.  Review  of Systems  Eyes:  Negative for visual disturbance.  Respiratory:  Negative for chest tightness and shortness of breath.   Cardiovascular:  Negative for chest pain, palpitations and leg swelling.  Endocrine: Negative for polydipsia and polyuria.  Neurological:  Negative for dizziness, light-headedness, numbness and headaches.       Memory changes    Per HPI unless specifically indicated above     Objective:    BP (!) 161/83   Pulse 71   Temp 97.6 F (36.4 C) (Oral)   Wt 154 lb (69.9 kg)   LMP 11/28/1994 (Approximate)   SpO2 99%   BMI 28.17 kg/m   Wt Readings from Last 3 Encounters:  11/23/22 154 lb (69.9 kg)  08/31/22 151 lb (68.5 kg)  08/28/22 151 lb 12.8 oz (68.9 kg)    Physical Exam Vitals and nursing note reviewed.  Constitutional:      General: She is not in acute distress.    Appearance: Normal appearance. She is normal weight. She is not ill-appearing, toxic-appearing or diaphoretic.  HENT:     Head: Normocephalic.     Right Ear: External ear normal.     Left Ear: External ear normal.     Nose: Nose normal.     Mouth/Throat:     Mouth: Mucous membranes are moist.     Pharynx: Oropharynx is clear.  Eyes:     General:        Right eye: No discharge.        Left eye: No discharge.     Extraocular Movements: Extraocular movements intact.     Conjunctiva/sclera: Conjunctivae normal.     Pupils:  Pupils are equal, round, and reactive to light.  Cardiovascular:     Rate and Rhythm: Normal rate and regular rhythm.     Heart sounds: No murmur heard. Pulmonary:     Effort: Pulmonary effort is normal. No respiratory distress.     Breath sounds: Normal breath sounds. No wheezing or rales.  Musculoskeletal:     Cervical back: Normal range of motion and neck supple.  Skin:    General: Skin is warm and dry.     Capillary Refill: Capillary refill takes less than 2 seconds.  Neurological:     General: No focal deficit present.     Mental Status: She is alert and  oriented to person, place, and time. Mental status is at baseline.  Psychiatric:        Mood and Affect: Mood normal.        Behavior: Behavior normal.        Thought Content: Thought content normal.        Judgment: Judgment normal.     Results for orders placed or performed in visit on 09/07/22  Urine Culture   Specimen: Urine   UR  Result Value Ref Range   Urine Culture, Routine Final report (A)    Organism ID, Bacteria Escherichia coli (A)    Antimicrobial Susceptibility Comment   Microscopic Examination   Urine  Result Value Ref Range   WBC, UA 0-5 0 - 5 /hpf   RBC, Urine None seen 0 - 2 /hpf   Epithelial Cells (non renal) 0-10 0 - 10 /hpf   Bacteria, UA Many (A) None seen/Few  Urinalysis, Routine w reflex microscopic  Result Value Ref Range   Specific Gravity, UA <1.005 (L) 1.005 - 1.030   pH, UA 5.5 5.0 - 7.5   Color, UA Yellow Yellow   Appearance Ur Hazy (A) Clear   Leukocytes,UA Trace (A) Negative   Protein,UA Negative Negative/Trace   Glucose, UA Negative Negative   Ketones, UA Negative Negative   RBC, UA Negative Negative   Bilirubin, UA Negative Negative   Urobilinogen, Ur 0.2 0.2 - 1.0 mg/dL   Nitrite, UA Negative Negative   Microscopic Examination See below:       Assessment & Plan:   Problem List Items Addressed This Visit       Cardiovascular and Mediastinum   Hypertension associated with diabetes (HCC) - Primary    Chronic.  Elevated at visit today.  Continue with current medication regimen of Lisinopril, hydralazine and Amlodipine.  Recommend checking blood pressure at home and bringing log to next visit.  Return to clinic in 1 months for reevaluation.  Call sooner if concerns arise.        Relevant Orders   Comp Met (CMET)   Atherosclerosis of aorta (HCC)    Chronic.  Controlled.  Continue with current medication regimen of Atorvastatin 20mg .  Labs ordered today.  Return to clinic in 3 months for reevaluation.  Call sooner if concerns arise.          Endocrine   Type 2 diabetes mellitus with proteinuria (HCC)    Chronic.  Last A1c was 6.2%.  Labs ordered today.  Controlled on Metformin 500mg  BID. Doing well.  No currently checking sugars. Reminded patient to get eye exam done.  Will make recommendations based on lab results. Follow up in 3 months.  Call sooner if concerns arise.       Relevant Orders   HgB A1c   Microalbumin, Urine Waived  Hyperlipidemia associated with type 2 diabetes mellitus (HCC)    Chronic.  Controlled.  Continue with current medication regimen of Atorvastatin 20mg .  Labs ordered today.  Return to clinic in 3 months for reevaluation.  Call sooner if concerns arise.         Relevant Orders   Lipid Profile     Other   Menopause    Will repeat UA.  Discussed with patient and daughter who was on the phone about high incidence of asymptomatic bacteremia.        Relevant Orders   Urinalysis, Routine w reflex microscopic     Follow up plan: Return in about 1 month (around 12/23/2022) for BP Check.

## 2022-11-23 NOTE — Assessment & Plan Note (Signed)
Chronic.  Elevated at visit today.  Continue with current medication regimen of Lisinopril, hydralazine and Amlodipine.  Recommend checking blood pressure at home and bringing log to next visit.  Return to clinic in 1 months for reevaluation.  Call sooner if concerns arise.

## 2022-11-23 NOTE — Assessment & Plan Note (Signed)
Chronic.  Controlled.  Continue with current medication regimen of Atorvastatin 20mg.  Labs ordered today.  Return to clinic in 3 months for reevaluation.  Call sooner if concerns arise.   

## 2022-11-23 NOTE — Assessment & Plan Note (Signed)
Chronic.  Last A1c was 6.2%.  Labs ordered today.  Controlled on Metformin 500mg  BID. Doing well.  No currently checking sugars. Reminded patient to get eye exam done.  Will make recommendations based on lab results. Follow up in 3 months.  Call sooner if concerns arise.

## 2022-11-24 LAB — HEMOGLOBIN A1C
Est. average glucose Bld gHb Est-mCnc: 134 mg/dL
Hgb A1c MFr Bld: 6.3 % — ABNORMAL HIGH (ref 4.8–5.6)

## 2022-11-24 LAB — COMPREHENSIVE METABOLIC PANEL
ALT: 14 IU/L (ref 0–32)
AST: 23 IU/L (ref 0–40)
Albumin/Globulin Ratio: 1.6 (ref 1.2–2.2)
Albumin: 4.3 g/dL (ref 3.8–4.8)
Alkaline Phosphatase: 78 IU/L (ref 44–121)
BUN/Creatinine Ratio: 20 (ref 12–28)
BUN: 19 mg/dL (ref 8–27)
Bilirubin Total: 0.2 mg/dL (ref 0.0–1.2)
CO2: 25 mmol/L (ref 20–29)
Calcium: 9.6 mg/dL (ref 8.7–10.3)
Chloride: 97 mmol/L (ref 96–106)
Creatinine, Ser: 0.96 mg/dL (ref 0.57–1.00)
Globulin, Total: 2.7 g/dL (ref 1.5–4.5)
Glucose: 83 mg/dL (ref 70–99)
Potassium: 4.2 mmol/L (ref 3.5–5.2)
Sodium: 135 mmol/L (ref 134–144)
Total Protein: 7 g/dL (ref 6.0–8.5)
eGFR: 60 mL/min/{1.73_m2} (ref >59)

## 2022-11-24 LAB — LIPID PANEL
Chol/HDL Ratio: 2.3 ratio (ref 0.0–4.4)
Cholesterol, Total: 172 mg/dL (ref 100–199)
HDL: 76 mg/dL (ref >39)
LDL Chol Calc (NIH): 80 mg/dL (ref 0–99)
Triglycerides: 91 mg/dL (ref 0–149)
VLDL Cholesterol Cal: 16 mg/dL (ref 5–40)

## 2022-11-24 NOTE — Progress Notes (Signed)
HI Ms. Deneka. It was nice to see you yesterday.  Your lab work looks good.  Your A1c is stable at 6.3%.  You are starting to creep up in the prediabetic range and getting closer to the diabetic range.  I recommend cutting back on sweets and sugary drinks.  Your liver, kidneys and electrolytes look great.  Your urine showed a small amount of bacteria.  I have sent it for culture.  However, since you are not symptomatic I will likely not treat you at this time.  Continue to drink plenty of water and stay well hydrated. No concerns at this time. Continue with your current medication regimen.  Follow up as discussed.  Please let me know if you have any questions.

## 2022-11-27 NOTE — Progress Notes (Signed)
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  Since she is not symptomatic we do not need to treat at this time.  Taking antibiotics can cause other problems such as diarrhea.  However, if she does become symptomatic I will treat her at that time.  Also, if she would like a second opinion I am happy to send her to Urology.  Please let me know if she has any questions.

## 2022-11-28 LAB — URINE CULTURE

## 2022-12-13 ENCOUNTER — Other Ambulatory Visit: Payer: Self-pay | Admitting: Nurse Practitioner

## 2022-12-14 NOTE — Telephone Encounter (Signed)
Requested Prescriptions  Pending Prescriptions Disp Refills   lisinopril (ZESTRIL) 40 MG tablet [Pharmacy Med Name: Lisinopril 40 MG Oral Tablet] 90 tablet 0    Sig: Take 1 tablet by mouth once daily     Cardiovascular:  ACE Inhibitors Failed - 12/13/2022  9:21 AM      Failed - Last BP in normal range    BP Readings from Last 1 Encounters:  11/23/22 (!) 161/83         Passed - Cr in normal range and within 180 days    Creatinine, Ser  Date Value Ref Range Status  11/23/2022 0.96 0.57 - 1.00 mg/dL Final         Passed - K in normal range and within 180 days    Potassium  Date Value Ref Range Status  11/23/2022 4.2 3.5 - 5.2 mmol/L Final         Passed - Patient is not pregnant      Passed - Valid encounter within last 6 months    Recent Outpatient Visits           3 weeks ago Hypertension associated with diabetes [facility])   [place] [facility] Larae Grooms, NP   3 months ago Acute cystitis without hematuria   [place] [facility] Larae Grooms, NP   3 months ago Acute cystitis without hematuria   [place] [facility] Larae Grooms, NP   3 months ago Hypertension associated with diabetes [facility])   [place] [facility] Larae Grooms, NP   6 months ago Atherosclerosis of aorta [facility])   [place] [facility] Larae Grooms, NP       Future Appointments             In 1 week Pearley, Sherran Needs, NP [place] [facility]

## 2022-12-25 ENCOUNTER — Ambulatory Visit (INDEPENDENT_AMBULATORY_CARE_PROVIDER_SITE_OTHER): Payer: Medicare Other | Admitting: Family Medicine

## 2022-12-25 ENCOUNTER — Encounter: Payer: Self-pay | Admitting: Family Medicine

## 2022-12-25 VITALS — BP 153/80 | HR 77 | Temp 97.7°F | Wt 155.2 lb

## 2022-12-25 DIAGNOSIS — I152 Hypertension secondary to endocrine disorders: Secondary | ICD-10-CM

## 2022-12-25 DIAGNOSIS — E1159 Type 2 diabetes mellitus with other circulatory complications: Secondary | ICD-10-CM

## 2022-12-25 DIAGNOSIS — Z7985 Long-term (current) use of injectable non-insulin antidiabetic drugs: Secondary | ICD-10-CM | POA: Diagnosis not present

## 2022-12-25 LAB — MICROALBUMIN, URINE WAIVED
Creatinine, Urine Waived: 10 mg/dL (ref 10–300)
Microalb, Ur Waived: 10 mg/L (ref 0–19)

## 2022-12-25 MED ORDER — METFORMIN HCL 500 MG PO TABS: ORAL_TABLET | ORAL | 1 refills | Status: DC

## 2022-12-25 MED ORDER — CHLORTHALIDONE 25 MG PO TABS
37.5000 mg | ORAL_TABLET | Freq: Every day | ORAL | 1 refills | Status: DC
Start: 2022-12-25 — End: 2023-02-26

## 2022-12-25 NOTE — Assessment & Plan Note (Signed)
Chronic, uncontrolled. Urine microalbumin 30-300 mg/g today. BP today 153/80. Continue Amlodipine 10 mg, Lisinopril 40 mg, increased Chlorthalidone from 25 mg to 37.5 mg. If urinary symptoms present will add Hydralazine 10 mg daily and decrease Chlorthalidone dose back to 25 mg. Recommend continue checking BP at home, 150 mins exercise weekly, and DASH diet information provided.

## 2022-12-25 NOTE — Progress Notes (Signed)
BP (!) 153/80 (BP Location: Left Arm)   Pulse 77   Temp 97.7 F (36.5 C) (Oral)   Wt 155 lb 3.2 oz (70.4 kg)   LMP 11/28/1994 (Approximate)   SpO2 97%   BMI 28.39 kg/m    Subjective:    Patient ID: Ellen [name], female    DOB: [birth year], 82 y.o.   MRN: [id number]  HPI: Ellen [name] is a 82 y.o. female  Chief Complaint  Patient presents with   Hypertension    Pt has a list of recent bp readings taken at home.   HYPERTENSION without Chronic Kidney Disease Patient BP remains elevated today 156/83. She is taking Amlodipine 10, mg Lisinopril 40 mg, and Chlorthalidone 25 mg daily. She is checking her BP at home ranging between 115-143/73-88. Her diet consist of mostly fruits and vegetables daily, pepper instead of salt, and water intake consist of x 4 bottles daily. BP recheck 153/80.  Hypertension status: stable  Satisfied with current treatment? no Duration of hypertension: years BP monitoring frequency:  x3 weekly BP range: 115-143/73-88 BP medication side effects:  no Medication compliance: Excellent Aspirin: yes Recurrent headaches: no Visual changes: no Palpitations: no Dyspnea: no Chest pain: no Lower extremity edema: no Dizzy/lightheaded: no   Relevant past medical, surgical, family and social history reviewed and updated as indicated. Interim medical history since our last visit reviewed. Allergies and medications reviewed and updated.  Review of Systems  Eyes:  Negative for visual disturbance.  Respiratory:  Negative for shortness of breath.   Cardiovascular:  Negative for chest pain, palpitations and leg swelling.  Neurological:  Negative for dizziness, light-headedness and headaches.    Per HPI unless specifically indicated above     Objective:    BP (!) 153/80 (BP Location: Left Arm)   Pulse 77   Temp 97.7 F (36.5 C) (Oral)   Wt 155 lb 3.2 oz (70.4 kg)   LMP 11/28/1994 (Approximate)   SpO2 97%   BMI 28.39 kg/m   Wt Readings from  Last 3 Encounters:  12/25/22 155 lb 3.2 oz (70.4 kg)  11/23/22 154 lb (69.9 kg)  08/31/22 151 lb (68.5 kg)    Physical Exam Vitals and nursing note reviewed.  Constitutional:      General: She is awake. She is not in acute distress.    Appearance: Normal appearance. She is well-developed and well-groomed. She is not ill-appearing.  HENT:     Head: Normocephalic and atraumatic.     Right Ear: Hearing and external ear normal. No drainage.     Left Ear: Hearing and external ear normal. No drainage.     Nose: Nose normal.  Eyes:     General: Lids are normal.        Right eye: No discharge.        Left eye: No discharge.     Conjunctiva/sclera: Conjunctivae normal.  Cardiovascular:     Rate and Rhythm: Normal rate and regular rhythm.     Pulses:          Radial pulses are 2+ on the right side and 2+ on the left side.       Dorsalis pedis pulses are 2+ on the right side and 2+ on the left side.       Posterior tibial pulses are 2+ on the right side and 2+ on the left side.     Heart sounds: Normal heart sounds, S1 normal and S2 normal. No murmur heard.  No gallop.  Pulmonary:     Effort: Pulmonary effort is normal. No accessory muscle usage or respiratory distress.     Breath sounds: Normal breath sounds.  Musculoskeletal:        General: Normal range of motion.     Cervical back: Full passive range of motion without pain and normal range of motion.     Right lower leg: No edema.     Left lower leg: No edema.  Skin:    General: Skin is warm and dry.     Capillary Refill: Capillary refill takes less than 2 seconds.  Neurological:     Mental Status: She is alert and oriented to person, place, and time.  Psychiatric:        Attention and Perception: Attention normal.        Mood and Affect: Mood normal.        Speech: Speech normal.        Behavior: Behavior normal. Behavior is cooperative.        Thought Content: Thought content normal.     Results for orders placed or  performed in visit on 11/23/22  Microscopic Examination   Urine  Result Value Ref Range   WBC, UA 0-5 0 - 5 /hpf   RBC, Urine 0-2 0 - 2 /hpf   Epithelial Cells (non renal) 0-10 0 - 10 /hpf   Bacteria, UA Moderate (A) None seen/Few  Urine Culture   Specimen: Urine   UR  Result Value Ref Range   Urine Culture, Routine Final report (A)    Organism ID, Bacteria Escherichia coli (A)    Antimicrobial Susceptibility Comment   Comp Met (CMET)  Result Value Ref Range   Glucose 83 70 - 99 mg/dL   BUN 19 8 - 27 mg/dL   Creatinine, Ser 8.41 0.57 - 1.00 mg/dL   eGFR 60 >32 GM/WNU/2.72   BUN/Creatinine Ratio 20 12 - 28   Sodium 135 134 - 144 mmol/L   Potassium 4.2 3.5 - 5.2 mmol/L   Chloride 97 96 - 106 mmol/L   CO2 25 20 - 29 mmol/L   Calcium 9.6 8.7 - 10.3 mg/dL   Total Protein 7.0 6.0 - 8.5 g/dL   Albumin 4.3 3.8 - 4.8 g/dL   Globulin, Total 2.7 1.5 - 4.5 g/dL   Albumin/Globulin Ratio 1.6 1.2 - 2.2   Bilirubin Total 0.2 0.0 - 1.2 mg/dL   Alkaline Phosphatase 78 44 - 121 IU/L   AST 23 0 - 40 IU/L   ALT 14 0 - 32 IU/L  Lipid Profile  Result Value Ref Range   Cholesterol, Total 172 100 - 199 mg/dL   Triglycerides 91 0 - 149 mg/dL   HDL 76 >53 mg/dL   VLDL Cholesterol Cal 16 5 - 40 mg/dL   LDL Chol Calc (NIH) 80 0 - 99 mg/dL   Chol/HDL Ratio 2.3 0.0 - 4.4 ratio  HgB A1c  Result Value Ref Range   Hgb A1c MFr Bld 6.3 (H) 4.8 - 5.6 %   Est. average glucose Bld gHb Est-mCnc 134 mg/dL  Urinalysis, Routine w reflex microscopic  Result Value Ref Range   Specific Gravity, UA 1.010 1.005 - 1.030   pH, UA 7.0 5.0 - 7.5   Color, UA Yellow Yellow   Appearance Ur Cloudy (A) Clear   Leukocytes,UA 1+ (A) Negative   Protein,UA Negative Negative/Trace   Glucose, UA Negative Negative   Ketones, UA Negative Negative   RBC, UA Negative  Negative   Bilirubin, UA Negative Negative   Urobilinogen, Ur 0.2 0.2 - 1.0 mg/dL   Nitrite, UA Negative Negative   Microscopic Examination See below:        Assessment & Plan:   Problem List Items Addressed This Visit     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 Urine microalbumin 30-300 mg/g today. BP today 153/80. Continue Amlodipine 10 mg, Lisinopril 40 mg, increased Chlorthalidone from 25 mg to 37.5 mg. If urinary symptoms present will add Hydralazine 10 mg daily and decrease Chlorthalidone dose back to 25 mg. Recommend continue checking BP at home, 150 mins exercise weekly, and DASH diet information provided.       Relevant Medications   metFORMIN (GLUCOPHAGE) 500 MG tablet   chlorthalidone (HYGROTON) 25 MG tablet   Other Relevant Orders   Urine Microalbumin w/creat. ratio     Follow up plan: Return in about 1 month (around 01/25/2023) for BP check and medication management.

## 2022-12-25 NOTE — Patient Instructions (Addendum)
Take 1.5 tablets of the Chlorthalidone totaling 37.5 mg daily.   Avoid high sodium intake  Exercise 150 mins weekly  Continue drinking 75 oz water daily  Follow DASH diet

## 2023-01-09 ENCOUNTER — Other Ambulatory Visit: Payer: Self-pay | Admitting: Nurse Practitioner

## 2023-01-09 DIAGNOSIS — I152 Hypertension secondary to endocrine disorders: Secondary | ICD-10-CM

## 2023-01-10 ENCOUNTER — Other Ambulatory Visit: Payer: Self-pay

## 2023-01-10 DIAGNOSIS — I152 Hypertension secondary to endocrine disorders: Secondary | ICD-10-CM

## 2023-01-10 MED ORDER — AMLODIPINE BESYLATE 10 MG PO TABS
10.0000 mg | ORAL_TABLET | Freq: Every day | ORAL | 1 refills | Status: DC
Start: 2023-01-10 — End: 2023-02-26

## 2023-01-10 NOTE — Telephone Encounter (Signed)
Patient presented to the office stating that she is completely out of Amlodipine. Last seen by Rashelle 12/25/22 and has appointment 01/25/23.   Routing to only provider in office this afternoon.

## 2023-01-10 NOTE — Telephone Encounter (Signed)
Unable to refill per protocol, Rx request was refilled 01/10/23 for 90 and 1 refill, duplicate request.  Requested Prescriptions  Pending Prescriptions Disp Refills   amLODipine (NORVASC) 10 MG tablet [Pharmacy Med Name: amLODIPine Besylate 10 MG Oral Tablet] 90 tablet 0    Sig: Take 1 tablet by mouth once daily     Cardiovascular: Calcium Channel Blockers 2 Failed - 01/09/2023 11:13 AM      Failed - Last BP in normal range    BP Readings from Last 1 Encounters:  12/25/22 (!) 153/80         Passed - Last Heart Rate in normal range    Pulse Readings from Last 1 Encounters:  12/25/22 77         Passed - Valid encounter within last 6 months    Recent Outpatient Visits           2 weeks ago Hypertension associated with diabetes [facility])   [place] [facility] [place], Sherran Needs, NP   1 month ago Hypertension associated with diabetes [facility])   [place] [facility] Larae Grooms, NP   4 months ago Acute cystitis without hematuria   [place] [facility] Larae Grooms, NP   4 months ago Acute cystitis without hematuria   [place] [facility] Larae Grooms, NP   4 months ago Hypertension associated with diabetes [facility])   [place] [facility] Larae Grooms, NP       Future Appointments             In 2 weeks Pearley, Sherran Needs, NP [place] [facility], [facility]

## 2023-01-25 ENCOUNTER — Encounter: Payer: Self-pay | Admitting: Family Medicine

## 2023-01-25 ENCOUNTER — Ambulatory Visit (INDEPENDENT_AMBULATORY_CARE_PROVIDER_SITE_OTHER): Payer: Medicare Other | Admitting: Family Medicine

## 2023-01-25 VITALS — BP 120/69 | HR 80 | Temp 98.4°F | Wt 149.4 lb

## 2023-01-25 DIAGNOSIS — I152 Hypertension secondary to endocrine disorders: Secondary | ICD-10-CM | POA: Diagnosis not present

## 2023-01-25 DIAGNOSIS — Z7984 Long term (current) use of oral hypoglycemic drugs: Secondary | ICD-10-CM | POA: Diagnosis not present

## 2023-01-25 DIAGNOSIS — E1159 Type 2 diabetes mellitus with other circulatory complications: Secondary | ICD-10-CM | POA: Diagnosis not present

## 2023-01-25 MED ORDER — ATORVASTATIN CALCIUM 20 MG PO TABS: 20.0000 mg | ORAL_TABLET | Freq: Every day | ORAL | 1 refills | Status: DC

## 2023-01-25 NOTE — Progress Notes (Signed)
BP 120/69   Pulse 80   Temp 98.4 F (36.9 C) (Oral)   Wt 149 lb 6.4 oz (67.8 kg)   LMP 11/28/1994 (Approximate)   BMI 27.33 kg/m    Subjective:    Patient ID: Ellen [name], female    DOB: May 14, 1941, 82 y.o.   MRN: [id number]  HPI: Ellen [name] is a 82 y.o. female  Chief Complaint  Patient presents with   Hypertension   HYPERTENSION without Chronic Kidney Disease She is checking her BP weekly with improvement. BP today is 120/69.She is taking Amlodipine 10 mg, Lisinopril 40 mg, and Chlorthalidone 37.5 mg. She has started baking more foods instead of fried foods, using less salt in food and walking 30 minutes for 3 days weekly.  Hypertension status: uncontrolled  Satisfied with current treatment? no Duration of hypertension: chronic BP monitoring frequency:  weekly BP range: 120-143/68-93 BP medication side effects:  no Medication compliance: excellent compliance Aspirin: yes Recurrent headaches: no Visual changes: no Palpitations: no Dyspnea: no Chest pain: no Lower extremity edema: no Dizzy/lightheaded: no   Relevant past medical, surgical, family and social history reviewed and updated as indicated. Interim medical history since our last visit reviewed. Allergies and medications reviewed and updated.  Review of Systems  Eyes:  Negative for visual disturbance.  Respiratory:  Negative for shortness of breath.   Cardiovascular:  Negative for chest pain, palpitations and leg swelling.  Neurological:  Negative for dizziness, light-headedness and headaches.    Per HPI unless specifically indicated above     Objective:    BP 120/69   Pulse 80   Temp 98.4 F (36.9 C) (Oral)   Wt 149 lb 6.4 oz (67.8 kg)   LMP 11/28/1994 (Approximate)   BMI 27.33 kg/m   Wt Readings from Last 3 Encounters:  01/25/23 149 lb 6.4 oz (67.8 kg)  12/25/22 155 lb 3.2 oz (70.4 kg)  11/23/22 154 lb (69.9 kg)    Physical Exam Vitals and nursing note reviewed.   Constitutional:      General: She is awake. She is not in acute distress.    Appearance: Normal appearance. She is well-developed and well-groomed. She is not ill-appearing.  HENT:     Head: Normocephalic and atraumatic.     Right Ear: Hearing and external ear normal. No drainage.     Left Ear: Hearing and external ear normal. No drainage.     Nose: Nose normal.  Eyes:     General: Lids are normal.        Right eye: No discharge.        Left eye: No discharge.     Conjunctiva/sclera: Conjunctivae normal.  Cardiovascular:     Rate and Rhythm: Normal rate and regular rhythm.     Pulses:          Radial pulses are 2+ on the right side and 2+ on the left side.       Posterior tibial pulses are 2+ on the right side and 2+ on the left side.     Heart sounds: Normal heart sounds, S1 normal and S2 normal. No murmur heard.    No gallop.  Pulmonary:     Effort: Pulmonary effort is normal. No accessory muscle usage or respiratory distress.     Breath sounds: Normal breath sounds.  Musculoskeletal:        General: Normal range of motion.     Cervical back: Full passive range of motion without pain and normal  range of motion.     Right lower leg: No edema.     Left lower leg: No edema.  Skin:    General: Skin is warm and dry.     Capillary Refill: Capillary refill takes less than 2 seconds.  Neurological:     Mental Status: She is alert and oriented to person, place, and time.  Psychiatric:        Attention and Perception: Attention normal.        Mood and Affect: Mood normal.        Speech: Speech normal.        Behavior: Behavior normal. Behavior is cooperative.        Thought Content: Thought content normal.     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 BP today 120/69. Continue Amlodipine 10 mg, Lisinopril 40 mg, Chlorthalidone 37.5 mg. Recommend continue checking BP at home, 150 mins exercise weekly, and DASH diet. Return in 1 month.       Relevant Medications   atorvastatin (LIPITOR) 20 MG tablet     Follow up plan: Return in about 1 month (around 02/25/2023) for BP Recheck, DM2, and lipid.

## 2023-01-25 NOTE — Assessment & Plan Note (Addendum)
Chronic, stable. BP today 120/69. Continue Amlodipine 10 mg, Lisinopril 40 mg, Chlorthalidone 37.5 mg. Recommend continue checking BP at home, 150 mins exercise weekly, and DASH diet. Return in 1 month.

## 2023-01-30 ENCOUNTER — Encounter: Payer: Self-pay | Admitting: Family Medicine

## 2023-01-30 ENCOUNTER — Ambulatory Visit (INDEPENDENT_AMBULATORY_CARE_PROVIDER_SITE_OTHER): Payer: Medicare Other | Admitting: Family Medicine

## 2023-01-30 VITALS — BP 125/68 | HR 78 | Ht 62.0 in | Wt 153.0 lb

## 2023-01-30 DIAGNOSIS — J398 Other specified diseases of upper respiratory tract: Secondary | ICD-10-CM

## 2023-01-30 NOTE — Progress Notes (Signed)
BP 125/68 (BP Location: Right Arm, Cuff Size: Normal)   Pulse 78   Ht 5\' 2"  (1.575 m)   Wt 153 lb (69.4 kg)   LMP 11/28/1994 (Approximate)   SpO2 100%   BMI 27.98 kg/m    Subjective:    Patient ID: Ellen [name], female    DOB: 1941/01/20, 82 y.o.   MRN: [id number]  HPI: Ellen [name] is a 82 y.o. female  Chief Complaint  Patient presents with   Congestion   Hoarse    Patient says when she woke up Sunday morning, she noticed some hoarseness. Patient declines any other symptoms. Patient says she has tried over the counter Coricidin. Patient says she feels she is getting a lot better.    UPPER RESPIRATORY TRACT INFECTION She complains of hoarseness and congestion that started Sunday, started taking Coricidin HBP yesterday.  Worst symptom:congestion  Fever: no Cough: yes Shortness of breath: no Wheezing: no Chest pain: no Chest tightness: no Chest congestion: no Nasal congestion: no Runny nose: no Post nasal drip: no Sneezing: no Sore throat: no Swollen glands: no Sinus pressure: no Headache: no Face pain: no Toothache: no Ear pain: no  Ear pressure: no  Eyes red/itching:no Eye drainage/crusting: no  Vomiting: no Rash: no Fatigue: no Sick contacts: no Strep contacts: no  Context: better Recurrent sinusitis: no Relief with OTC cold/cough medications: yes  Treatments attempted: cough syrup  Coricidin x2 daily  Relevant past medical, surgical, family and social history reviewed and updated as indicated. Interim medical history since our last visit reviewed. Allergies and medications reviewed and updated.  Review of Systems  Constitutional:  Negative for chills, fatigue and fever.  HENT:  Positive for congestion. Negative for ear pain, postnasal drip, rhinorrhea, sinus pressure, sinus pain, sneezing and sore throat.   Eyes:  Negative for discharge, redness and itching.  Respiratory:  Positive for cough. Negative for chest tightness, shortness of  breath and wheezing.   Cardiovascular:  Negative for chest pain.  Gastrointestinal:  Negative for vomiting.  Skin:  Negative for rash.  Neurological:  Negative for headaches.    Per HPI unless specifically indicated above     Objective:    BP 125/68 (BP Location: Right Arm, Cuff Size: Normal)   Pulse 78   Ht 5\' 2"  (1.575 m)   Wt 153 lb (69.4 kg)   LMP 11/28/1994 (Approximate)   SpO2 100%   BMI 27.98 kg/m   Wt Readings from Last 3 Encounters:  01/30/23 153 lb (69.4 kg)  01/25/23 149 lb 6.4 oz (67.8 kg)  12/25/22 155 lb 3.2 oz (70.4 kg)    Physical Exam Vitals and nursing note reviewed.  Constitutional:      General: She is awake. She is not in acute distress.    Appearance: Normal appearance. She is well-developed and well-groomed. She is not ill-appearing, toxic-appearing or diaphoretic.  HENT:     Head: Normocephalic and atraumatic.     Right Ear: Hearing, tympanic membrane, ear canal and external ear normal. No drainage. There is no impacted cerumen.     Left Ear: Hearing, tympanic membrane, ear canal and external ear normal. No drainage. There is no impacted cerumen.     Nose: Congestion present. No rhinorrhea.     Right Sinus: No maxillary sinus tenderness or frontal sinus tenderness.     Left Sinus: No maxillary sinus tenderness or frontal sinus tenderness.     Mouth/Throat:     Mouth: Mucous membranes are moist.  Pharynx: Oropharynx is clear. No oropharyngeal exudate, posterior oropharyngeal erythema or postnasal drip.  Eyes:     General: Lids are normal. No scleral icterus.       Right eye: No discharge.        Left eye: No discharge.     Extraocular Movements: Extraocular movements intact.     Conjunctiva/sclera: Conjunctivae normal.     Pupils: Pupils are equal, round, and reactive to light.  Neck:     Vascular: No carotid bruit.  Cardiovascular:     Rate and Rhythm: Normal rate and regular rhythm.     Pulses: Normal pulses.     Heart sounds: Normal  heart sounds, S1 normal and S2 normal. No murmur heard.    No friction rub. No gallop.  Pulmonary:     Effort: Pulmonary effort is normal. No accessory muscle usage or respiratory distress.     Breath sounds: Normal breath sounds. No stridor. No wheezing, rhonchi or rales.  Chest:     Chest wall: No tenderness.  Musculoskeletal:        General: Normal range of motion.     Cervical back: Full passive range of motion without pain, normal range of motion and neck supple. No rigidity. No muscular tenderness.     Right lower leg: No edema.     Left lower leg: No edema.  Lymphadenopathy:     Cervical: No cervical adenopathy.  Skin:    General: Skin is warm and dry.     Capillary Refill: Capillary refill takes less than 2 seconds.     Coloration: Skin is not jaundiced or pale.     Findings: No bruising, erythema, lesion or rash.  Neurological:     General: No focal deficit present.     Mental Status: She is alert and oriented to person, place, and time. Mental status is at baseline.     Cranial Nerves: No cranial nerve deficit.     Sensory: No sensory deficit.     Motor: No weakness.     Coordination: Coordination normal.     Gait: Gait normal.     Deep Tendon Reflexes: Reflexes normal.  Psychiatric:        Attention and Perception: Attention normal.        Mood and Affect: Mood normal.        Speech: Speech normal.        Behavior: Behavior normal. Behavior is cooperative.        Thought Content: Thought content normal.        Judgment: Judgment normal.     Results for orders placed or performed in visit on 12/25/22  Microalbumin, Urine Waived  Result Value Ref Range   Microalb, Ur Waived 10 0 - 19 mg/L   Creatinine, Urine Waived 10 10 - 300 mg/dL   Microalb/Creat Ratio 30-300 (H) <30 mg/g      Assessment & Plan:   Problem List Items Addressed This Visit   None Visit Diagnoses     Congestion of upper respiratory tract    -  Primary   Acute, ongong. Prescription written  for home COVID test, continue use of Coricidin TID, recommend rest and hydration. Will treat with antiviral if COViD +.        Follow up plan: Return if symptoms worsen or fail to improve.

## 2023-01-30 NOTE — Patient Instructions (Addendum)
BP 125/68 Take COVID test prescription to pharmacy Continue taking Coricidin every 8 hours Continue with warm liquids: Progresso soup- reduced sodium and hot tea Stay hydrated If no better by Monday call for antibiotic

## 2023-02-26 ENCOUNTER — Encounter: Payer: Self-pay | Admitting: Family Medicine

## 2023-02-26 ENCOUNTER — Ambulatory Visit (INDEPENDENT_AMBULATORY_CARE_PROVIDER_SITE_OTHER): Payer: Medicare Other | Admitting: Family Medicine

## 2023-02-26 VITALS — BP 107/67 | HR 77 | Ht 62.0 in | Wt 153.4 lb

## 2023-02-26 DIAGNOSIS — I152 Hypertension secondary to endocrine disorders: Secondary | ICD-10-CM

## 2023-02-26 DIAGNOSIS — R809 Proteinuria, unspecified: Secondary | ICD-10-CM

## 2023-02-26 DIAGNOSIS — E1129 Type 2 diabetes mellitus with other diabetic kidney complication: Secondary | ICD-10-CM | POA: Diagnosis not present

## 2023-02-26 DIAGNOSIS — R7303 Prediabetes: Secondary | ICD-10-CM | POA: Diagnosis not present

## 2023-02-26 DIAGNOSIS — E1159 Type 2 diabetes mellitus with other circulatory complications: Secondary | ICD-10-CM

## 2023-02-26 DIAGNOSIS — Z7984 Long term (current) use of oral hypoglycemic drugs: Secondary | ICD-10-CM | POA: Diagnosis not present

## 2023-02-26 DIAGNOSIS — E876 Hypokalemia: Secondary | ICD-10-CM | POA: Diagnosis not present

## 2023-02-26 LAB — MICROALBUMIN, URINE WAIVED
Creatinine, Urine Waived: 50 mg/dL (ref 10–300)
Microalb, Ur Waived: 10 mg/L (ref 0–19)
Microalb/Creat Ratio: <30 mg/g (ref <30)

## 2023-02-26 LAB — BAYER DCA HB A1C WAIVED: HB A1C (BAYER DCA - WAIVED): 6.3 % — ABNORMAL HIGH (ref 4.8–5.6)

## 2023-02-26 MED ORDER — AMLODIPINE BESYLATE 10 MG PO TABS
10.0000 mg | ORAL_TABLET | Freq: Every day | ORAL | 1 refills | Status: DC
Start: 2023-02-26 — End: 2023-05-28

## 2023-02-26 MED ORDER — CHLORTHALIDONE 25 MG PO TABS
37.5000 mg | ORAL_TABLET | Freq: Every day | ORAL | 1 refills | Status: DC
Start: 2023-02-26 — End: 2023-05-28

## 2023-02-26 MED ORDER — LISINOPRIL 40 MG PO TABS: 40.0000 mg | ORAL_TABLET | Freq: Every day | ORAL | 0 refills | Status: DC

## 2023-02-26 MED ORDER — METFORMIN HCL 500 MG PO TABS: ORAL_TABLET | ORAL | 1 refills | Status: DC

## 2023-02-26 NOTE — Progress Notes (Addendum)
BP 107/67   Pulse 77   Ht 5\' 2"  (1.575 m)   Wt 153 lb 6.4 oz (69.6 kg)   LMP 11/28/1994 (Approximate)   SpO2 99%   BMI 28.06 kg/m    Subjective:    Patient ID: Ellen [name], female  DOB: 04-11-1941, 82 y.o.   MRN: [id number]  HPI: Ellen [name] is a 82 y.o. female  Chief Complaint  Patient presents with   Hypertension   Diabetes   Medication Consultation    Patient says at her last visit her [place] was increased to 1.5 tablets and says she is about to be out of her medication due to take a tablet in a half and breaking it in half. Patient is requesting a new refill to reflect new instructions at today's visit.    HYPERTENSION without Chronic Kidney Disease She is taking Amlodipine 10 mg, Lisinopril 40 mg, Chlorthalidone 37.5 mg.  Hypertension status: stable  Satisfied with current treatment? yes Duration of hypertension: chronic BP monitoring frequency:  daily BP range: 126-149/75-93 BP medication side effects:  no Medication compliance: excellent compliance Aspirin: yes Recurrent headaches: no Visual changes: no Palpitations: no Dyspnea: no Chest pain: no Lower extremity edema: no Dizzy/lightheaded: no   DIABETES A1C today 6.3% at goal. She is taking Metformin 500 BID.  Polydipsia/polyuria: no Visual disturbance: no will schedule annual eye exam with ophthalmologist Chest pain: no Paresthesias: yes in the morning has some numbness in thumbs for a little while that eventually goes away Blood Pressure Monitoring: daily  Relevant past medical, surgical, family and social history reviewed and updated as indicated. Interim medical history since our last visit reviewed. Allergies and medications reviewed and updated.  Review of Systems  Eyes:  Negative for visual disturbance.  Respiratory:  Negative for shortness of breath.   Cardiovascular:  Negative for chest pain, palpitations and leg swelling.  Endocrine: Negative for polydipsia, polyphagia and  polyuria.  Genitourinary:  Negative for frequency.  Neurological:  Negative for dizziness, light-headedness, numbness and headaches.   Per HPI unless specifically indicated above     Objective:    BP 107/67   Pulse 77   Ht 5\' 2"  (1.575 m)   Wt 153 lb 6.4 oz (69.6 kg)   LMP 11/28/1994 (Approximate)   SpO2 99%   BMI 28.06 kg/m   Wt Readings from Last 3 Encounters:  02/26/23 153 lb 6.4 oz (69.6 kg)  01/30/23 153 lb (69.4 kg)  01/25/23 149 lb 6.4 oz (67.8 kg)    Physical Exam Vitals and nursing note reviewed.  Constitutional:      General: She is awake. She is not in acute distress.    Appearance: Normal appearance. She is well-developed and well-groomed. She is not ill-appearing.  HENT:     Head: Normocephalic and atraumatic.     Right Ear: Hearing and external ear normal. No drainage.     Left Ear: Hearing and external ear normal. No drainage.     Nose: Nose normal.  Eyes:     General: Lids are normal.        Right eye: No discharge.        Left eye: No discharge.     Conjunctiva/sclera: Conjunctivae normal.  Cardiovascular:     Rate and Rhythm: Normal rate and regular rhythm.     Pulses:          Radial pulses are 2+ on the right side and 2+ on the left side.  Posterior tibial pulses are 2+ on the right side and 2+ on the left side.     Heart sounds: Normal heart sounds, S1 normal and S2 normal. No murmur heard.    No gallop.  Pulmonary:     Effort: Pulmonary effort is normal. No accessory muscle usage or respiratory distress.     Breath sounds: Normal breath sounds.  Musculoskeletal:        General: Normal range of motion.     Cervical back: Full passive range of motion without pain and normal range of motion.     Right lower leg: No edema.     Left lower leg: No edema.  Skin:    General: Skin is warm and dry.     Capillary Refill: Capillary refill takes less than 2 seconds.  Neurological:     Mental Status: She is alert and oriented to person, place, and  time.  Psychiatric:        Attention and Perception: Attention normal.        Mood and Affect: Mood normal.        Speech: Speech normal.        Behavior: Behavior normal. Behavior is cooperative.        Thought Content: Thought content normal.     Results for orders placed or performed in visit on 02/26/23  Bayer DCA Hb A1c Waived  Result Value Ref Range   HB A1C (BAYER DCA - WAIVED) 6.3 (H) 4.8 - 5.6 %  Microalbumin, Urine Waived  Result Value Ref Range   Microalb, Ur Waived 10 0 - 19 mg/L   Creatinine, Urine Waived 50 10 - 300 mg/dL   Microalb/Creat Ratio <30 <30 mg/g      Assessment & Plan:   Problem List Items Addressed This Visit     Type 2 diabetes mellitus with proteinuria (HCC)    Chronic. A1c today 6.3%. CMP, urine microalbumin, and A1C today.  Controlled on Metformin 500mg  BID. Doing well. Not currently checking sugars. Reminded patient to get eye exam done. Will make recommendations based on lab results. Refills sent. Follow up in 3 months.  Call sooner if concerns arise.       Relevant Medications   lisinopril (ZESTRIL) 40 MG tablet   metFORMIN (GLUCOPHAGE) 500 MG tablet   Other Relevant Orders   Comp Met (CMET)   Bayer DCA Hb A1c Waived (Completed)   Hypertension associated with diabetes (HCC) - Primary    Chronic, stable. BP today 107/67. Continue Amlodipine 10 mg, Lisinopril 40 mg, Chlorthalidone 37.5 mg. Recommend continue checking BP at home, 150 mins exercise weekly, and DASH diet. Refills given. Return in 3 months.      Relevant Medications   amLODipine (NORVASC) 10 MG tablet   lisinopril (ZESTRIL) 40 MG tablet   chlorthalidone (HYGROTON) 25 MG tablet   metFORMIN (GLUCOPHAGE) 500 MG tablet   Other Relevant Orders   Comp Met (CMET)     Follow up plan: Return in about 3 months (around 05/28/2023) for BP Recheck, diabetes, and lipid.

## 2023-02-26 NOTE — Patient Instructions (Addendum)
Get annual flu and COVID vaccines Continue checking BP at home Focus on DASH diet Exercise 150 mins weekly (fast walking, regular walking, jog) Stay hydrated with 75 ounces of water daily  Return in 3 months for BP and diabetes, and cholesterol follow up Return in 2 months for annual wellness exam

## 2023-02-26 NOTE — Assessment & Plan Note (Addendum)
Chronic, stable. BP today 107/67. Continue Amlodipine 10 mg, Lisinopril 40 mg, Chlorthalidone 37.5 mg. Recommend continue checking BP at home, 150 mins exercise weekly, and DASH diet. Refills given. Return in 3 months.

## 2023-02-26 NOTE — Assessment & Plan Note (Addendum)
Chronic. A1c today 6.3%. CMP, urine microalbumin, and A1C today.  Controlled on Metformin 500mg  BID. Doing well. Not currently checking sugars. Reminded patient to get eye exam done. Will make recommendations based on lab results. Refills sent. Follow up in 3 months.  Call sooner if concerns arise.

## 2023-02-27 ENCOUNTER — Other Ambulatory Visit: Payer: Self-pay | Admitting: Family Medicine

## 2023-02-27 LAB — COMPREHENSIVE METABOLIC PANEL
ALT: 11 IU/L (ref 0–32)
AST: 17 IU/L (ref 0–40)
Albumin: 4.2 g/dL (ref 3.8–4.8)
Alkaline Phosphatase: 72 IU/L (ref 44–121)
BUN/Creatinine Ratio: 23 (ref 12–28)
BUN: 22 mg/dL (ref 8–27)
Bilirubin Total: 0.2 mg/dL (ref 0.0–1.2)
CO2: 26 mmol/L (ref 20–29)
Calcium: 9.1 mg/dL (ref 8.7–10.3)
Chloride: 96 mmol/L (ref 96–106)
Creatinine, Ser: 0.95 mg/dL (ref 0.57–1.00)
Globulin, Total: 2.5 g/dL (ref 1.5–4.5)
Glucose: 85 mg/dL (ref 70–99)
Potassium: 3.4 mmol/L — ABNORMAL LOW (ref 3.5–5.2)
Sodium: 135 mmol/L (ref 134–144)
Total Protein: 6.7 g/dL (ref 6.0–8.5)
eGFR: 61 mL/min/{1.73_m2} (ref >59)

## 2023-03-02 NOTE — Progress Notes (Signed)
Hi Britt, can you let Ellen [name] know her lab results look good. Her A1C remains stable at 6.3. However her potassium is a little bit low so I recommend eating a banana or orange a day to help with her potassium, and we will set her up for a lab visit to come in for a recheck in one week. I have put the future order in. Thank you!

## 2023-03-02 NOTE — Addendum Note (Signed)
Addended by: Prescott Gum on: 03/02/2023 10:25 AM   Modules accepted: Orders

## 2023-03-26 LAB — HM DIABETES EYE EXAM

## 2023-05-15 ENCOUNTER — Ambulatory Visit: Payer: Medicare Other | Admitting: Emergency Medicine

## 2023-05-15 VITALS — Ht 62.0 in | Wt 153.0 lb

## 2023-05-15 DIAGNOSIS — Z0000 Encounter for general adult medical examination without abnormal findings: Secondary | ICD-10-CM | POA: Diagnosis not present

## 2023-05-15 NOTE — Patient Instructions (Addendum)
Ellen [name] , Thank you for taking time to come for your Medicare Wellness Visit. I appreciate your ongoing commitment to your health goals. Please review the following plan we discussed and let me know if I can assist you in the future.   Referrals/Orders/Follow-Ups/Clinician Recommendations: Get a tetanus shot at your earliest convenience. I have requested records from your last diabetic eye exam from [facility]. You will  be due for a diabetic foot exam at your next office visit on 05/28/23.  This is a list of the screening recommended for you and due dates:  Health Maintenance  Topic Date Due   DTaP/Tdap/Td vaccine (2 - Tdap) 07/21/2018   Eye exam for diabetics  07/26/2022   COVID-19 Vaccine (7 - 2023-24 season) 02/18/2023   Complete foot exam   05/25/2023   Hemoglobin A1C  08/26/2023   Yearly kidney function blood test for diabetes  02/26/2024   Yearly kidney health urinalysis for diabetes  02/26/2024   Medicare Annual Wellness Visit  05/14/2024   Pneumonia Vaccine  Completed   Flu Shot  Completed   DEXA scan (bone density measurement)  Completed   Hepatitis C Screening  Completed   Zoster (Shingles) Vaccine  Completed   HPV Vaccine  Aged Out   Colon Cancer Screening  Discontinued    Advanced directives: (Copy Requested) Please bring a copy of your health care power of attorney and living will to the office to be added to your chart at your convenience.  Next Medicare Annual Wellness Visit scheduled for next year: Yes, 05/20/24 @ 10:00am

## 2023-05-15 NOTE — Progress Notes (Signed)
Subjective:   Ellen [name] is a 82 y.o. female who presents for Medicare Annual (Subsequent) preventive examination.  Visit Complete: Virtual I connected with  Ellen [name] on 05/15/23 by a audio enabled telemedicine application and verified that I am speaking with the correct person using two identifiers.  Patient Location: Home  Provider Location: Office/Clinic  I discussed the limitations of evaluation and management by telemedicine. The patient expressed understanding and agreed to proceed.  Vital Signs: Because this visit was a virtual/telehealth visit, some criteria may be missing or patient reported. Any vitals not documented were not able to be obtained and vitals that have been documented are patient reported.   Cardiac Risk Factors include: advanced age (>33men, >22 women);hypertension;diabetes mellitus;dyslipidemia     Objective:    Today's Vitals   05/15/23 0859  Weight: 153 lb (69.4 kg)  Height: 5\' 2"  (1.575 m)   Body mass index is 27.98 kg/m.     05/15/2023    9:11 AM 05/09/2022   10:35 AM 04/12/2022    1:05 PM 03/15/2022   10:43 AM 02/02/2022   12:32 AM 04/28/2021   10:04 AM 01/10/2021   12:45 PM  Advanced Directives  Does Patient Have a Medical Advance Directive? Yes Yes No No No No Yes  Type of Estate agent of [place];Living will Healthcare Power of Attorney       Does patient want to make changes to medical advance directive? No - Patient declined        Copy of Healthcare Power of Attorney in Chart? No - copy requested No - copy requested       Would patient like information on creating a medical advance directive?   No - Patient declined No - Patient declined No - Patient declined No - Patient declined     Current Medications (verified) Outpatient Encounter Medications as of 05/15/2023  Medication Sig   amLODipine (NORVASC) 10 MG tablet Take 1 tablet (10 mg total) by mouth daily.   aspirin 81 MG tablet Take 81 mg by  mouth daily.   atorvastatin (LIPITOR) 20 MG tablet Take 1 tablet (20 mg total) by mouth daily.   CALCIUM-MAGNESIUM-ZINC PO Take 1 tablet by mouth daily.   chlorthalidone (HYGROTON) 25 MG tablet Take 1.5 tablets (37.5 mg total) by mouth daily.   Cholecalciferol (VITAMIN D3) 1000 units CAPS Take 2 capsules by mouth daily.    lisinopril (ZESTRIL) 40 MG tablet Take 1 tablet (40 mg total) by mouth daily.   metFORMIN (GLUCOPHAGE) 500 MG tablet Take 1 tablet (500 MG) by mouth in morning with meal and 1 tablet (500 MG) in evening with meal.   vitamin C (ASCORBIC ACID) 500 MG tablet Take 500 mg by mouth daily.   blood glucose meter kit and supplies KIT Dispense based on patient and insurance preference. Use up to four times daily as directed. (FOR ICD-9 250.00, 250.01).   calcium-vitamin D 250-100 MG-UNIT tablet Take 1 tablet by mouth 2 (two) times daily. (Patient not taking: Reported on 05/15/2023)   Magnesium 500 MG TABS Take 1 tablet by mouth daily. (Patient not taking: Reported on 05/15/2023)   potassium chloride (KLOR-CON M) 10 MEQ tablet Take 1 tablet (10 mEq total) by mouth daily. (Patient not taking: Reported on 05/15/2023)   No facility-administered encounter medications on file as of 05/15/2023.    Allergies (verified) Patient has no known allergies.   History: Past Medical History:  Diagnosis Date   Hyperlipidemia  Hypertension    Lumbago    Menopause    Migraines    Past Surgical History:  Procedure Laterality Date   CHOLECYSTECTOMY  1982   TUBAL LIGATION  1984   Family History  Problem Relation Age of Onset   Alzheimer's disease Mother    Diabetes Mother    Stroke Mother    Hyperlipidemia Mother    Stroke Father    Hypertension Father    Hyperlipidemia Father    Diabetes Sister    Diabetes Brother    Hypertension Brother    Diabetes Sister    Social History   Socioeconomic History   Marital status: Married    Spouse name: Ellen [name]   Number of children: 4   Years  of education: some college   Highest education level: Some college, no degree  Occupational History   Occupation: retired  Tobacco Use   Smoking status: Never   Smokeless tobacco: Never  Vaping Use   Vaping status: Never Used  Substance and Sexual Activity   Alcohol use: No    Alcohol/week: 0.0 standard drinks of alcohol   Drug use: No   Sexual activity: Yes  Other Topics Concern   Not on file  Social History Narrative   Not on file   Social Determinants of Health   Financial Resource Strain: Low Risk  (05/15/2023)   Overall Financial Resource Strain (CARDIA)    Difficulty of Paying Living Expenses: Not hard at all  Food Insecurity: No Food Insecurity (05/15/2023)   Hunger Vital Sign    Worried About Running Out of Food in the Last Year: Never true    Ran Out of Food in the Last Year: Never true  Transportation Needs: No Transportation Needs (05/15/2023)   PRAPARE - Administrator, Civil Service (Medical): No    Lack of Transportation (Non-Medical): No  Physical Activity: Inactive (05/15/2023)   Exercise Vital Sign    Days of Exercise per Week: 0 days    Minutes of Exercise per Session: 0 min  Stress: No Stress Concern Present (05/15/2023)   [employer] of Occupational Health - Occupational Stress Questionnaire    Feeling of Stress : Not at all  Social Connections: Socially Integrated (05/15/2023)   Social Connection and Isolation Panel [NHANES]    Frequency of Communication with Friends and Family: More than three times a week    Frequency of Social Gatherings with Friends and Family: More than three times a week    Attends Religious Services: More than 4 times per year    Active Member of [employer] or Organizations: Yes    Attends Banker Meetings: 1 to 4 times per year    Marital Status: Married    Tobacco Counseling Counseling given: Not Answered   Clinical Intake:  Pre-visit preparation completed: Yes  Pain : No/denies pain      BMI - recorded: 27.98 Nutritional Status: BMI 25 -29 Overweight Nutritional Risks: None Diabetes: Yes CBG done?: No Did pt. bring in CBG monitor from home?: No  How often do you need to have someone help you when you read instructions, pamphlets, or other written materials from your doctor or pharmacy?: 1 - Never  Interpreter Needed?: No  Information entered by :: Tora Kindred, CMA   Activities of Daily Living    05/15/2023    9:01 AM  In your present state of health, do you have any difficulty performing the following activities:  Hearing? 0  Vision? 0  Difficulty concentrating or making decisions? 0  Walking or climbing stairs? 0  Dressing or bathing? 0  Doing errands, shopping? 0  Preparing Food and eating ? N  Using the Toilet? N  In the past six months, have you accidently leaked urine? N  Do you have problems with loss of bowel control? N  Managing your Medications? N  Managing your Finances? N  Housekeeping or managing your Housekeeping? N    Patient Care Team: Larae Grooms, NP as PCP - General  Indicate any recent Medical Services you may have received from other than [facility] providers in the past year (date may be approximate).     Assessment:   This is a routine wellness examination for [facility].  Hearing/Vision screen Hearing Screening - Comments:: Denies hearing loss Vision Screening - Comments:: Gets eye exams   Goals Addressed               This Visit's Progress     DIET - INCREASE WATER INTAKE (pt-stated)        Depression Screen    05/15/2023    9:10 AM 02/26/2023    8:58 AM 01/30/2023   11:01 AM 01/30/2023   10:36 AM 01/25/2023    8:39 AM 12/25/2022    9:43 AM 11/23/2022    8:52 AM  PHQ 2/9 Scores  PHQ - 2 Score 0 0 0 0 0 0 0  PHQ- 9 Score 0 0 0 0 0 0 0    Fall Risk    05/15/2023    9:12 AM 02/26/2023    8:58 AM 01/30/2023   10:35 AM 01/25/2023    8:39 AM 08/23/2022   10:01 AM  Fall Risk   Falls in the past year? 0 0 0 0 0  Number  falls in past yr: 0 0 0 0 0  Injury with Fall? 0 0 0 0 0  Risk for fall due to : No Fall Risks No Fall Risks No Fall Risks No Fall Risks No Fall Risks  Follow up Falls prevention discussed Falls evaluation completed Falls evaluation completed Falls evaluation completed Falls evaluation completed    MEDICARE RISK AT HOME: Medicare Risk at Home Any stairs in or around the home?: Yes If so, are there any without handrails?: Yes Home free of loose throw rugs in walkways, pet beds, electrical cords, etc?: Yes Adequate lighting in your home to reduce risk of falls?: Yes Life alert?: No Use of a cane, walker or w/c?: No Grab bars in the bathroom?: No Shower chair or bench in shower?: No Elevated toilet seat or a handicapped toilet?: Yes  TIMED UP AND GO:  Was the test performed?  No    Cognitive Function:    06/08/2021   10:03 AM  MMSE - Mini Mental State Exam  Orientation to time 5  Orientation to Place 5  Registration 3  Attention/ Calculation 5  Recall 2  Language- name 2 objects 2  Language- repeat 1  Language- follow 3 step command 3  Language- read & follow direction 1  Write a sentence 1  Copy design 0  Total score 28        05/15/2023    9:13 AM 05/09/2022   10:37 AM 04/26/2020    3:21 PM 04/23/2019    3:08 PM 04/15/2018    3:53 PM  6CIT Screen  What Year? 0 points 0 points 0 points 0 points 0 points  What month? 0 points 0 points 0 points 0 points  0 points  What time? 0 points 0 points 0 points 0 points 0 points  Count back from 20 0 points 0 points 0 points 0 points 0 points  Months in reverse 0 points 0 points 0 points 0 points 0 points  Repeat phrase 0 points 0 points 2 points 0 points 2 points  Total Score 0 points 0 points 2 points 0 points 2 points    Immunizations Immunization History  Administered Date(s) Administered   Fluad Quad(high Dose 65+) 03/18/2019, 03/05/2020, 03/09/2021   Influenza, High Dose Seasonal PF 03/06/2016, 04/13/2017,  04/15/2018   Influenza,inj,Quad PF,6+ Mos 03/30/2015   Influenza-Unspecified 03/22/2022   PFIZER(Purple Top)SARS-COV-2 Vaccination 07/04/2019, 07/28/2019, 03/29/2020, 10/25/2020, 03/15/2021, 04/04/2022   Pneumococcal Conjugate-13 02/02/2014, 03/15/2021   Pneumococcal Polysaccharide-23 01/27/2013   Td 07/21/2008   Zoster Recombinant(Shingrix) 10/10/2021, 12/09/2021    TDAP status: Due, Education has been provided regarding the importance of this vaccine. Advised may receive this vaccine at local pharmacy or Health Dept. Aware to provide a copy of the vaccination record if obtained from local pharmacy or Health Dept. Verbalized acceptance and understanding.  Flu Vaccine status: Up to date  Pneumococcal vaccine status: Up to date  Covid-19 vaccine status: Information provided on how to obtain vaccines.   Qualifies for Shingles Vaccine? Yes   Zostavax completed No   Shingrix Completed?: Yes  Screening Tests Health Maintenance  Topic Date Due   DTaP/Tdap/Td (2 - Tdap) 07/21/2018   MAMMOGRAM  06/05/2020   OPHTHALMOLOGY EXAM  07/26/2022   INFLUENZA VACCINE  01/18/2023   COVID-19 Vaccine (7 - 2023-24 season) 02/18/2023   FOOT EXAM  05/25/2023   HEMOGLOBIN A1C  08/26/2023   Diabetic kidney evaluation - eGFR measurement  02/26/2024   Diabetic kidney evaluation - Urine ACR  02/26/2024   Medicare Annual Wellness (AWV)  05/14/2024   Pneumonia Vaccine 75+ Years old  Completed   DEXA SCAN  Completed   Hepatitis C Screening  Completed   Zoster Vaccines- Shingrix  Completed   HPV VACCINES  Aged Out   Colonoscopy  Discontinued    Health Maintenance  Health Maintenance Due  Topic Date Due   DTaP/Tdap/Td (2 - Tdap) 07/21/2018   MAMMOGRAM  06/05/2020   OPHTHALMOLOGY EXAM  07/26/2022   INFLUENZA VACCINE  01/18/2023   COVID-19 Vaccine (7 - 2023-24 season) 02/18/2023    Colorectal cancer screening: No longer required.   Mammogram status: No longer required due to age.  Bone Density  Status: patient declined  Lung Cancer Screening: (Low Dose CT Chest recommended if Age 32-80 years, 20 pack-year currently smoking OR have quit w/in 15years.) does not qualify.   Lung Cancer Screening Referral: n/a  Additional Screening:  Hepatitis C Screening: does not qualify; Completed 05/31/20  Vision Screening: Recommended annual ophthalmology exams for early detection of glaucoma and other disorders of the eye. Is the patient up to date with their annual eye exam?  Yes , done 03/2023. Requested records Who is the provider or what is the name of the office in which the patient attends annual eye exams? [facility], [place] If pt is not established with a provider, would they like to be referred to a provider to establish care? No .   Dental Screening: Recommended annual dental exams for proper oral hygiene  Diabetic Foot Exam: Diabetic Foot Exam: Completed 05/24/22  Community Resource Referral / Chronic Care Management: CRR required this visit?  No   CCM required this visit?  No     Plan:  I have personally reviewed and noted the following in the patient's chart:   Medical and social history Use of alcohol, tobacco or illicit drugs  Current medications and supplements including opioid prescriptions. Patient is not currently taking opioid prescriptions. Functional ability and status Nutritional status Physical activity Advanced directives List of other physicians Hospitalizations, surgeries, and ER visits in previous 12 months Vitals Screenings to include cognitive, depression, and falls Referrals and appointments  In addition, I have reviewed and discussed with patient certain preventive protocols, quality metrics, and best practice recommendations. A written personalized care plan for preventive services as well as general preventive health recommendations were provided to patient.     Tora Kindred, CMA   05/15/2023   After Visit Summary: (MyChart) Due to  this being a telephonic visit, the after visit summary with patients personalized plan was offered to patient via MyChart   Nurse Notes:  Needs Tdap Declined referral to DM & Nutrition education Declined DEXA scan Requested records of last eye exam done 03/2023 @[facility]  [place], [place] Will need diabetic foot exam at next OV on 05/28/23

## 2023-05-28 ENCOUNTER — Telehealth: Payer: Self-pay

## 2023-05-28 ENCOUNTER — Ambulatory Visit (INDEPENDENT_AMBULATORY_CARE_PROVIDER_SITE_OTHER): Payer: Medicare Other | Admitting: Nurse Practitioner

## 2023-05-28 ENCOUNTER — Encounter: Payer: Self-pay | Admitting: Nurse Practitioner

## 2023-05-28 VITALS — BP 134/85 | HR 80 | Temp 98.2°F | Ht 62.0 in | Wt 157.4 lb

## 2023-05-28 DIAGNOSIS — Z6831 Body mass index (BMI) 31.0-31.9, adult: Secondary | ICD-10-CM

## 2023-05-28 DIAGNOSIS — I152 Hypertension secondary to endocrine disorders: Secondary | ICD-10-CM | POA: Diagnosis not present

## 2023-05-28 DIAGNOSIS — Z7984 Long term (current) use of oral hypoglycemic drugs: Secondary | ICD-10-CM

## 2023-05-28 DIAGNOSIS — E66811 Obesity, class 1: Secondary | ICD-10-CM

## 2023-05-28 DIAGNOSIS — E785 Hyperlipidemia, unspecified: Secondary | ICD-10-CM | POA: Diagnosis not present

## 2023-05-28 DIAGNOSIS — E6609 Other obesity due to excess calories: Secondary | ICD-10-CM

## 2023-05-28 DIAGNOSIS — I700 Atherosclerosis of aorta: Secondary | ICD-10-CM | POA: Diagnosis not present

## 2023-05-28 DIAGNOSIS — E1159 Type 2 diabetes mellitus with other circulatory complications: Secondary | ICD-10-CM

## 2023-05-28 DIAGNOSIS — R809 Proteinuria, unspecified: Secondary | ICD-10-CM | POA: Diagnosis not present

## 2023-05-28 DIAGNOSIS — E1129 Type 2 diabetes mellitus with other diabetic kidney complication: Secondary | ICD-10-CM | POA: Diagnosis not present

## 2023-05-28 DIAGNOSIS — E1169 Type 2 diabetes mellitus with other specified complication: Secondary | ICD-10-CM | POA: Diagnosis not present

## 2023-05-28 MED ORDER — LISINOPRIL 40 MG PO TABS: 40.0000 mg | ORAL_TABLET | Freq: Every day | ORAL | 0 refills | Status: DC

## 2023-05-28 MED ORDER — AMLODIPINE BESYLATE 10 MG PO TABS: 10.0000 mg | ORAL_TABLET | Freq: Every day | ORAL | 1 refills | Status: DC

## 2023-05-28 MED ORDER — METFORMIN HCL 500 MG PO TABS: ORAL_TABLET | ORAL | 1 refills | Status: DC

## 2023-05-28 MED ORDER — ATORVASTATIN CALCIUM 20 MG PO TABS: 20.0000 mg | ORAL_TABLET | Freq: Every day | ORAL | 1 refills | Status: DC

## 2023-05-28 MED ORDER — CHLORTHALIDONE 25 MG PO TABS: 37.5000 mg | ORAL_TABLET | Freq: Every day | ORAL | 1 refills | Status: DC

## 2023-05-28 NOTE — Assessment & Plan Note (Signed)
Chronic.  Controlled.  Continue with current medication regimen of Atorvastatin 20mg.  Labs ordered today.  Return to clinic in 3 months for reevaluation.  Call sooner if concerns arise.   

## 2023-05-28 NOTE — Assessment & Plan Note (Signed)
Chronic, Controlled.  Continue Amlodipine 10 mg, Lisinopril 40 mg, Chlorthalidone 37.5 mg. Recommend continue checking BP at home, 150 mins exercise weekly, and DASH diet. Refills given. Return in 3 months.

## 2023-05-28 NOTE — Telephone Encounter (Signed)
-----   Message from Ellen [name] sent at 05/28/2023  8:19 AM EST ----- Can we request her eye exam? Had eye exam done at my eye [facility].

## 2023-05-28 NOTE — Assessment & Plan Note (Signed)
Chronic.  Last A1c was 6.3%.  Labs ordered today.  Controlled on Metformin 500mg  BID. Doing well.  Not currently checking sugars. Eye exam done at [facility]- will request results.  Will make recommendations based on lab results. Follow up in 3 months.  Call sooner if concerns arise.

## 2023-05-28 NOTE — Assessment & Plan Note (Signed)
Recommended eating smaller high protein, low fat meals more frequently and exercising 30 mins a day 5 times a week with a goal of 10-15lb weight loss in the next 3 months.  

## 2023-05-28 NOTE — Progress Notes (Signed)
BP 134/85 (BP Location: Left Arm, Patient Position: Sitting, Cuff Size: Large)   Pulse 80   Temp 98.2 F (36.8 C) (Oral)   Ht 5\' 2"  (1.575 m)   Wt 157 lb 6.4 oz (71.4 kg)   LMP 11/28/1994 (Approximate)   SpO2 99%   BMI 28.79 kg/m    Subjective:    Patient ID: Ellen [name], female  DOB: [birth year], 82 y.o.   MRN: [id number]  HPI: Ellen [name] is a 82 y.o. female  Chief Complaint  Patient presents with   3 month follow up   Blood Pressure Check    lipi   Hyperlipidemia   HYPERTENSION without Chronic Kidney Disease She is taking Amlodipine 10 mg, Lisinopril 40 mg, Chlorthalidone 37.5 mg.  Hypertension status: stable  Satisfied with current treatment? yes Duration of hypertension: chronic BP monitoring frequency:  daily BP range: 126-149/75-93 BP medication side effects:  no Medication compliance: excellent compliance Aspirin: yes Recurrent headaches: no Visual changes: no Palpitations: no Dyspnea: no Chest pain: no Lower extremity edema: no Dizzy/lightheaded: no   DIABETES A1C today 6.3% at goal. She is taking Metformin 500 BID.  Polydipsia/polyuria: no Visual disturbance: yes- done at [facility] Chest pain: no Paresthesias: yes in the morning has some numbness in thumbs for a little while that eventually goes away Blood Pressure Monitoring: daily  Relevant past medical, surgical, family and social history reviewed and updated as indicated. Interim medical history since our last visit reviewed. Allergies and medications reviewed and updated.  Review of Systems  Eyes:  Negative for visual disturbance.  Respiratory:  Negative for shortness of breath.   Cardiovascular:  Negative for chest pain, palpitations and leg swelling.  Endocrine: Negative for polydipsia, polyphagia and polyuria.  Genitourinary:  Negative for frequency.  Neurological:  Negative for dizziness, light-headedness, numbness and headaches.   Per HPI unless specifically indicated  above     Objective:    BP 134/85 (BP Location: Left Arm, Patient Position: Sitting, Cuff Size: Large)   Pulse 80   Temp 98.2 F (36.8 C) (Oral)   Ht 5\' 2"  (1.575 m)   Wt 157 lb 6.4 oz (71.4 kg)   LMP 11/28/1994 (Approximate)   SpO2 99%   BMI 28.79 kg/m   Wt Readings from Last 3 Encounters:  05/28/23 157 lb 6.4 oz (71.4 kg)  05/15/23 153 lb (69.4 kg)  02/26/23 153 lb 6.4 oz (69.6 kg)    Physical Exam Vitals and nursing note reviewed.  Constitutional:      General: She is awake. She is not in acute distress.    Appearance: Normal appearance. She is well-developed and well-groomed. She is not ill-appearing.  HENT:     Head: Normocephalic and atraumatic.     Right Ear: Hearing and external ear normal. No drainage.     Left Ear: Hearing and external ear normal. No drainage.     Nose: Nose normal.  Eyes:     General: Lids are normal.        Right eye: No discharge.        Left eye: No discharge.     Conjunctiva/sclera: Conjunctivae normal.  Cardiovascular:     Rate and Rhythm: Normal rate and regular rhythm.     Pulses:          Radial pulses are 2+ on the right side and 2+ on the left side.       Posterior tibial pulses are 2+ on the right side and  2+ on the left side.     Heart sounds: Normal heart sounds, S1 normal and S2 normal. No murmur heard.    No gallop.  Pulmonary:     Effort: Pulmonary effort is normal. No accessory muscle usage or respiratory distress.     Breath sounds: Normal breath sounds.  Musculoskeletal:        General: Normal range of motion.     Cervical back: Full passive range of motion without pain and normal range of motion.     Right lower leg: No edema.     Left lower leg: No edema.  Skin:    General: Skin is warm and dry.     Capillary Refill: Capillary refill takes less than 2 seconds.  Neurological:     Mental Status: She is alert and oriented to person, place, and time.  Psychiatric:        Attention and Perception: Attention normal.         Mood and Affect: Mood normal.        Speech: Speech normal.        Behavior: Behavior normal. Behavior is cooperative.        Thought Content: Thought content normal.     Results for orders placed or performed in visit on 02/26/23  Comp Met (CMET)  Result Value Ref Range   Glucose 85 70 - 99 mg/dL   BUN 22 8 - 27 mg/dL   Creatinine, Ser 0.98 0.57 - 1.00 mg/dL   eGFR 61 >11 BJ/YNW/2.95   BUN/Creatinine Ratio 23 12 - 28   Sodium 135 134 - 144 mmol/L   Potassium 3.4 (L) 3.5 - 5.2 mmol/L   Chloride 96 96 - 106 mmol/L   CO2 26 20 - 29 mmol/L   Calcium 9.1 8.7 - 10.3 mg/dL   Total Protein 6.7 6.0 - 8.5 g/dL   Albumin 4.2 3.8 - 4.8 g/dL   Globulin, Total 2.5 1.5 - 4.5 g/dL   Bilirubin Total 0.2 0.0 - 1.2 mg/dL   Alkaline Phosphatase 72 44 - 121 IU/L   AST 17 0 - 40 IU/L   ALT 11 0 - 32 IU/L  Bayer DCA Hb A1c Waived  Result Value Ref Range   HB A1C (BAYER DCA - WAIVED) 6.3 (H) 4.8 - 5.6 %  Microalbumin, Urine Waived  Result Value Ref Range   Microalb, Ur Waived 10 0 - 19 mg/L   Creatinine, Urine Waived 50 10 - 300 mg/dL   Microalb/Creat Ratio <30 <30 mg/g      Assessment & Plan:   Problem List Items Addressed This Visit       Cardiovascular and Mediastinum   Hypertension associated with diabetes (HCC)    Chronic, Controlled.  Continue Amlodipine 10 mg, Lisinopril 40 mg, Chlorthalidone 37.5 mg. Recommend continue checking BP at home, 150 mins exercise weekly, and DASH diet. Refills given. Return in 3 months.      Relevant Medications   chlorthalidone (HYGROTON) 25 MG tablet   amLODipine (NORVASC) 10 MG tablet   atorvastatin (LIPITOR) 20 MG tablet   lisinopril (ZESTRIL) 40 MG tablet   metFORMIN (GLUCOPHAGE) 500 MG tablet   Atherosclerosis of aorta (HCC)    Chronic.  Controlled.  Continue with current medication regimen of Atorvastatin 20mg .  Labs ordered today.  Return to clinic in 3 months for reevaluation.  Call sooner if concerns arise.       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  Last A1c was 6.3%.  Labs ordered today.  Controlled on Metformin 500mg  BID. Doing well.  Not currently checking sugars. Eye exam done at [facility]- will request results.  Will make recommendations based on lab results. Follow up in 3 months.  Call sooner if concerns arise.       Relevant Medications   atorvastatin (LIPITOR) 20 MG tablet   lisinopril (ZESTRIL) 40 MG tablet   metFORMIN (GLUCOPHAGE) 500 MG tablet   Other Relevant Orders   Comp Met (CMET)   HgB A1c   Hyperlipidemia associated with type 2 diabetes mellitus (HCC)    Chronic.  Controlled.  Continue with current medication regimen of Atorvastatin 20mg .  Labs ordered today.  Return to clinic in 3 months for reevaluation.  Call sooner if concerns arise.        Relevant Medications   chlorthalidone (HYGROTON) 25 MG tablet   amLODipine (NORVASC) 10 MG tablet   atorvastatin (LIPITOR) 20 MG tablet   lisinopril (ZESTRIL) 40 MG tablet   metFORMIN (GLUCOPHAGE) 500 MG tablet   Other Relevant Orders   Lipid Profile     Other   Obesity    Recommended eating smaller high protein, low fat meals more frequently and exercising 30 mins a day 5 times a week with a goal of 10-15lb weight loss in the next 3 months.      Relevant Medications   metFORMIN (GLUCOPHAGE) 500 MG tablet     Follow up plan: No follow-ups on file.

## 2023-05-28 NOTE — Telephone Encounter (Signed)
Patient most recent Diabetic Eye Exam was requested after today's visit.

## 2023-05-29 LAB — COMPREHENSIVE METABOLIC PANEL
ALT: 15 [IU]/L (ref 0–32)
AST: 24 [IU]/L (ref 0–40)
Albumin: 4.4 g/dL (ref 3.8–4.8)
Alkaline Phosphatase: 71 [IU]/L (ref 44–121)
BUN/Creatinine Ratio: 25 (ref 12–28)
BUN: 27 mg/dL (ref 8–27)
Bilirubin Total: <0.2 mg/dL (ref 0.0–1.2)
CO2: 26 mmol/L (ref 20–29)
Calcium: 10 mg/dL (ref 8.7–10.3)
Chloride: 98 mmol/L (ref 96–106)
Creatinine, Ser: 1.07 mg/dL — ABNORMAL HIGH (ref 0.57–1.00)
Globulin, Total: 2.5 g/dL (ref 1.5–4.5)
Glucose: 81 mg/dL (ref 70–99)
Potassium: 3.8 mmol/L (ref 3.5–5.2)
Sodium: 138 mmol/L (ref 134–144)
Total Protein: 6.9 g/dL (ref 6.0–8.5)
eGFR: 53 mL/min/{1.73_m2} — ABNORMAL LOW (ref >59)

## 2023-05-29 LAB — HEMOGLOBIN A1C
Est. average glucose Bld gHb Est-mCnc: 137 mg/dL
Hgb A1c MFr Bld: 6.4 % — ABNORMAL HIGH (ref 4.8–5.6)

## 2023-05-29 LAB — LIPID PANEL
Chol/HDL Ratio: 2.3 {ratio} (ref 0.0–4.4)
Cholesterol, Total: 155 mg/dL (ref 100–199)
HDL: 68 mg/dL (ref >39)
LDL Chol Calc (NIH): 73 mg/dL (ref 0–99)
Triglycerides: 72 mg/dL (ref 0–149)
VLDL Cholesterol Cal: 14 mg/dL (ref 5–40)

## 2023-06-25 ENCOUNTER — Ambulatory Visit (INDEPENDENT_AMBULATORY_CARE_PROVIDER_SITE_OTHER): Payer: Medicare Other | Admitting: Nurse Practitioner

## 2023-06-25 ENCOUNTER — Encounter: Payer: Self-pay | Admitting: Nurse Practitioner

## 2023-06-25 VITALS — BP 135/79 | HR 90 | Temp 98.1°F | Ht 62.0 in | Wt 159.0 lb

## 2023-06-25 DIAGNOSIS — R052 Subacute cough: Secondary | ICD-10-CM | POA: Insufficient documentation

## 2023-06-25 MED ORDER — AMOXICILLIN-POT CLAVULANATE 875-125 MG PO TABS: 1.0000 | ORAL_TABLET | Freq: Two times a day (BID) | ORAL | 0 refills | Status: AC

## 2023-06-25 NOTE — Assessment & Plan Note (Addendum)
 Acute for over one week and lingering.  Covid negative at home.  Will send in Augmentin  BID for 7 days.  No Prednisone  at this time due to diabetes, suspect abx will help improve symptoms. Recommend: - Increased rest - Increasing Fluids - Acetaminophen  / ibuprofen  as needed for fever/pain.  - Salt water gargling, chloraseptic spray and throat lozenges - OTC Diabetic Tussin  - Coricidin HBP - Humidifying the air.  To return if ongoing or worsening symptoms.

## 2023-06-25 NOTE — Progress Notes (Signed)
 Acute Office Visit  Subjective:     Patient ID: Ellen [name], female    DOB: March 13, 1941, 83 y.o.   MRN: [id number]  Chief Complaint  Patient presents with   Cough    Patient states she has been having a cough and chest congestion for the last week. States she does not cough up anything when she coughs.     Reports ongoing cough and chest congestion for over one week.  Has underlying diabetes with recent A1c 6.4% in December 2024.  Covid negative at home.  Started out with mild cough and then it progressed.  Non smoker.  Cough This is a new problem. The current episode started 1 to 4 weeks ago. The problem has been unchanged. The problem occurs every few minutes. The cough is Non-productive. Associated symptoms include postnasal drip and rhinorrhea. Pertinent negatives include no chest pain, chills, ear congestion, ear pain, fever, headaches, hemoptysis, myalgias, nasal congestion, rash, sore throat, shortness of breath, weight loss or wheezing. The symptoms are aggravated by lying down. She has tried OTC cough suppressant and rest for the symptoms. The treatment provided no relief. There is no history of asthma, bronchitis, COPD, emphysema or pneumonia.   Patient is in today for cough.  Review of Systems  Constitutional:  Negative for chills, diaphoresis, fever and weight loss.  HENT:  Positive for postnasal drip and rhinorrhea. Negative for congestion, ear discharge, ear pain, hearing loss, sinus pain and sore throat.   Respiratory:  Positive for cough. Negative for hemoptysis, shortness of breath and wheezing.   Cardiovascular:  Negative for chest pain, palpitations, orthopnea and leg swelling.  Gastrointestinal: Negative.   Musculoskeletal:  Negative for myalgias.  Skin:  Negative for rash.  Neurological:  Negative for dizziness, weakness and headaches.  Psychiatric/Behavioral: Negative.        Objective:    BP 135/79   Pulse 90   Temp 98.1 F (36.7 C) (Oral)   Ht 5'  2 (1.575 m)   Wt 159 lb (72.1 kg)   LMP 11/28/1994 (Approximate)   SpO2 98%   BMI 29.08 kg/m  BP Readings from Last 3 Encounters:  06/25/23 135/79  05/28/23 134/85  02/26/23 107/67   Wt Readings from Last 3 Encounters:  06/25/23 159 lb (72.1 kg)  05/28/23 157 lb 6.4 oz (71.4 kg)  05/15/23 153 lb (69.4 kg)   Physical Exam Vitals and nursing note reviewed.  Constitutional:      General: She is awake. She is not in acute distress.    Appearance: She is well-developed and well-groomed. She is obese. She is not ill-appearing or toxic-appearing.  HENT:     Head: Normocephalic.     Right Ear: Hearing, ear canal and external ear normal. A middle ear effusion is present. Tympanic membrane is not injected or perforated.     Left Ear: Hearing, ear canal and external ear normal. A middle ear effusion is present. Tympanic membrane is not injected or perforated.     Nose: Rhinorrhea present. Rhinorrhea is clear.     Right Sinus: No maxillary sinus tenderness or frontal sinus tenderness.     Left Sinus: No maxillary sinus tenderness or frontal sinus tenderness.     Mouth/Throat:     Mouth: Mucous membranes are moist.     Pharynx: Posterior oropharyngeal erythema (mild) present. No pharyngeal swelling or oropharyngeal exudate.  Eyes:     General: Lids are normal.        Right eye: No discharge.  Left eye: No discharge.     Conjunctiva/sclera: Conjunctivae normal.     Pupils: Pupils are equal, round, and reactive to light.  Neck:     Thyroid : No thyromegaly.     Vascular: No carotid bruit.  Cardiovascular:     Rate and Rhythm: Normal rate and regular rhythm.     Heart sounds: Normal heart sounds. No murmur heard.    No gallop.  Pulmonary:     Effort: Pulmonary effort is normal. No accessory muscle usage or respiratory distress.     Breath sounds: Wheezing present. No decreased breath sounds or rhonchi.     Comments: Occasional expiratory wheezes noted throughout. Abdominal:      General: Bowel sounds are normal.     Palpations: Abdomen is soft. There is no hepatomegaly or splenomegaly.  Musculoskeletal:     Cervical back: Normal range of motion and neck supple.     Right lower leg: No edema.     Left lower leg: No edema.  Lymphadenopathy:     Head:     Right side of head: No submental, submandibular, tonsillar, preauricular or posterior auricular adenopathy.     Left side of head: No submental, submandibular, tonsillar, preauricular or posterior auricular adenopathy.     Cervical: No cervical adenopathy.  Skin:    General: Skin is warm and dry.  Neurological:     Mental Status: She is alert and oriented to person, place, and time.  Psychiatric:        Attention and Perception: Attention normal.        Mood and Affect: Mood normal.        Speech: Speech normal.        Behavior: Behavior normal. Behavior is cooperative.        Thought Content: Thought content normal.    No results found for any visits on 06/25/23.      Assessment & Plan:   Problem List Items Addressed This Visit       Other   Subacute cough - Primary   Acute for over one week and lingering.  Covid negative at home.  Will send in Augmentin  BID for 7 days.  No Prednisone  at this time due to diabetes, suspect abx will help improve symptoms. Recommend: - Increased rest - Increasing Fluids - Acetaminophen  / ibuprofen  as needed for fever/pain.  - Salt water gargling, chloraseptic spray and throat lozenges - OTC Diabetic Tussin  - Coricidin HBP - Humidifying the air.  To return if ongoing or worsening symptoms.       Meds ordered this encounter  Medications   amoxicillin -clavulanate (AUGMENTIN ) 875-125 MG tablet    Sig: Take 1 tablet by mouth 2 (two) times daily for 7 days.    Dispense:  14 tablet    Refill:  0    Return if symptoms worsen or fail to improve.  Kyleen Villatoro T Kingsley Farace, NP

## 2023-06-25 NOTE — Patient Instructions (Addendum)
Diabetic Tussin and Coricidin  Cough, Adult A cough helps to clear your throat and lungs. It may be a sign of an illness or another condition. A short-term (acute) cough may last 2-3 weeks. A long-term (chronic) cough may last 8 or more weeks. Many things can cause a cough. They include: Illnesses such as: An infection in your throat or lungs. Asthma or other heart or lung problems. Gastroesophageal reflux. This is when acid comes back up from your stomach. Breathing in things that bother (irritate) your lungs. Allergies. Postnasal drip. This is when mucus runs down the back of your throat. Smoking. Some medicines. Follow these instructions at home: Medicines Take over-the-counter and prescription medicines only as told by your doctor. Talk with your doctor before you take cough medicine (cough suppressants). Eating and drinking Do not drink alcohol. Do not drink caffeine. Drink enough fluid to keep your pee (urine) pale yellow. Lifestyle Stay away from cigarette smoke. Do not smoke or use any products that contain nicotine or tobacco. If you need help quitting, ask your doctor. Stay away from things that make you cough. These may include perfume, candles, cleaning products, or campfire smoke. General instructions  Watch for any changes to your cough. Tell your doctor about them. Always cover your mouth when you cough. If the air is dry in your home, use a cool mist vaporizer or humidifier. If your cough is worse at night, try using extra pillows to raise your head up higher while you sleep. Rest as needed. Contact a doctor if: You have new symptoms. Your symptoms get worse. You cough up pus. You have a fever that does not go away. Your cough does not get better after 2-3 weeks. Cough medicine does not help, and you are not sleeping well. You have pain that gets worse or is not helped with medicine. You are losing weight and do not know why. You have night sweats. Get help  right away if: You cough up blood. You have trouble breathing. Your heart is beating very fast. These symptoms may be an emergency. Get help right away. Call 911. Do not wait to see if the symptoms will go away. Do not drive yourself to the hospital. This information is not intended to replace advice given to you by your health care provider. Make sure you discuss any questions you have with your health care provider. Document Revised: 02/03/2022 Document Reviewed: 02/03/2022 Elsevier Patient Education  2024 [employer].

## 2023-07-23 ENCOUNTER — Encounter: Payer: Self-pay | Admitting: Nurse Practitioner

## 2023-07-24 ENCOUNTER — Ambulatory Visit: Payer: Medicare Other | Admitting: Family Medicine

## 2023-07-24 ENCOUNTER — Encounter: Payer: Self-pay | Admitting: Family Medicine

## 2023-07-24 VITALS — BP 132/83 | HR 103 | Ht 62.0 in | Wt 155.2 lb

## 2023-07-24 DIAGNOSIS — M542 Cervicalgia: Secondary | ICD-10-CM | POA: Diagnosis not present

## 2023-07-24 MED ORDER — IBUPROFEN 800 MG PO TABS: 800.0000 mg | ORAL_TABLET | Freq: Three times a day (TID) | ORAL | 2 refills | Status: AC | PRN

## 2023-07-24 MED ORDER — CYCLOBENZAPRINE HCL 10 MG PO TABS: 5.0000 mg | ORAL_TABLET | Freq: Every evening | ORAL | 1 refills | Status: DC | PRN

## 2023-07-24 NOTE — Progress Notes (Signed)
 BP 132/83   Pulse (!) 103   Ht 5' 2 (1.575 m)   Wt 155 lb 3.2 oz (70.4 kg)   LMP 11/28/1994 (Approximate)   SpO2 99%   BMI 28.39 kg/m    Subjective:    Patient ID: Ellen [name], female    DOB: 10/19/1940, 83 y.o.   MRN: [id number]  HPI: Ellen [name] is a 83 y.o. female  Chief Complaint  Patient presents with   Spasms    Patient says she has been having issues with her Neck spasms. Patient says she first noticed this flare up on Friday. Patient says she the pain radiates from her neck into her shoulder. Patient says the pain and discomfort comes and goes. Patient says she usually take her 800 of Ibuprofen  and it helps. Patient says she usually sees [facility] Clinic, but they did not have any appointments. Patient says the pain gets worse at night time.    NECK PAIN  Status: uncontrolled Treatments attempted: rest, ice, heat, APAP, and ibuprofen   Relief with NSAIDs?:  mild Location:Left Duration: About 4 days Severity: severe Quality: tight and aching Frequency: intermittent Radiation: into her head Aggravating factors: laying down Alleviating factors: rest Weakness:  no Paresthesias / decreased sensation:  no  Fevers:  no  Relevant past medical, surgical, family and social history reviewed and updated as indicated. Interim medical history since our last visit reviewed. Allergies and medications reviewed and updated.  Review of Systems  Constitutional: Negative.   Respiratory: Negative.    Cardiovascular: Negative.   Musculoskeletal:  Positive for myalgias, neck pain and neck stiffness. Negative for arthralgias, back pain, gait problem and joint swelling.  Skin: Negative.   Psychiatric/Behavioral: Negative.      Per HPI unless specifically indicated above     Objective:    BP 132/83   Pulse (!) 103   Ht 5' 2 (1.575 m)   Wt 155 lb 3.2 oz (70.4 kg)   LMP 11/28/1994 (Approximate)   SpO2 99%   BMI 28.39 kg/m   Wt Readings from Last 3 Encounters:   07/24/23 155 lb 3.2 oz (70.4 kg)  06/25/23 159 lb (72.1 kg)  05/28/23 157 lb 6.4 oz (71.4 kg)    Physical Exam Vitals and nursing note reviewed.  Constitutional:      General: She is not in acute distress.    Appearance: Normal appearance. She is not ill-appearing, toxic-appearing or diaphoretic.  HENT:     Head: Normocephalic and atraumatic.     Right Ear: External ear normal.     Left Ear: External ear normal.     Nose: Nose normal.     Mouth/Throat:     Mouth: Mucous membranes are moist.     Pharynx: Oropharynx is clear.  Eyes:     General: No scleral icterus.       Right eye: No discharge.        Left eye: No discharge.     Extraocular Movements: Extraocular movements intact.     Conjunctiva/sclera: Conjunctivae normal.     Pupils: Pupils are equal, round, and reactive to light.  Cardiovascular:     Rate and Rhythm: Normal rate and regular rhythm.     Pulses: Normal pulses.     Heart sounds: Normal heart sounds. No murmur heard.    No friction rub. No gallop.  Pulmonary:     Effort: Pulmonary effort is normal. No respiratory distress.     Breath sounds: Normal breath sounds. No  stridor. No wheezing, rhonchi or rales.  Chest:     Chest wall: No tenderness.  Musculoskeletal:        General: Tenderness present.     Cervical back: Normal range of motion and neck supple.     Comments: Hypertonic paraspinal muscles on the neck L>R  Skin:    General: Skin is warm and dry.     Capillary Refill: Capillary refill takes less than 2 seconds.     Coloration: Skin is not jaundiced or pale.     Findings: No bruising, erythema, lesion or rash.  Neurological:     General: No focal deficit present.     Mental Status: She is alert and oriented to person, place, and time. Mental status is at baseline.  Psychiatric:        Mood and Affect: Mood normal.        Behavior: Behavior normal.        Thought Content: Thought content normal.        Judgment: Judgment normal.     Results  for orders placed or performed in visit on 05/28/23  Lipid Profile   Collection Time: 05/28/23  8:26 AM  Result Value Ref Range   Cholesterol, Total 155 100 - 199 mg/dL   Triglycerides 72 0 - 149 mg/dL   HDL 68 >60 mg/dL   VLDL Cholesterol Cal 14 5 - 40 mg/dL   LDL Chol Calc (NIH) 73 0 - 99 mg/dL   Chol/HDL Ratio 2.3 0.0 - 4.4 ratio  Comp Met (CMET)   Collection Time: 05/28/23  8:26 AM  Result Value Ref Range   Glucose 81 70 - 99 mg/dL   BUN 27 8 - 27 mg/dL   Creatinine, Ser 8.92 (H) 0.57 - 1.00 mg/dL   eGFR 53 (L) >40 fO/fpw/8.26   BUN/Creatinine Ratio 25 12 - 28   Sodium 138 134 - 144 mmol/L   Potassium 3.8 3.5 - 5.2 mmol/L   Chloride 98 96 - 106 mmol/L   CO2 26 20 - 29 mmol/L   Calcium  10.0 8.7 - 10.3 mg/dL   Total Protein 6.9 6.0 - 8.5 g/dL   Albumin 4.4 3.8 - 4.8 g/dL   Globulin, Total 2.5 1.5 - 4.5 g/dL   Bilirubin Total <9.7 0.0 - 1.2 mg/dL   Alkaline Phosphatase 71 44 - 121 IU/L   AST 24 0 - 40 IU/L   ALT 15 0 - 32 IU/L  HgB A1c   Collection Time: 05/28/23  8:26 AM  Result Value Ref Range   Hgb A1c MFr Bld 6.4 (H) 4.8 - 5.6 %   Est. average glucose Bld gHb Est-mCnc 137 mg/dL      Assessment & Plan:   Problem List Items Addressed This Visit   None Visit Diagnoses       Neck pain    -  Primary   Will treat with ibuprofen , flexeril , and stretches. Follow up with PCP next month. To see pain management tomorrow. Call with any concerns.        Follow up plan: Return if symptoms worsen or fail to improve.

## 2023-07-25 ENCOUNTER — Encounter: Payer: Self-pay | Admitting: Anesthesiology

## 2023-07-25 ENCOUNTER — Ambulatory Visit: Payer: Medicare Other | Attending: Anesthesiology | Admitting: Anesthesiology

## 2023-07-25 VITALS — BP 149/74 | HR 112 | Temp 98.1°F | Resp 18 | Ht 62.0 in | Wt 155.0 lb

## 2023-07-25 DIAGNOSIS — M48062 Spinal stenosis, lumbar region with neurogenic claudication: Secondary | ICD-10-CM | POA: Insufficient documentation

## 2023-07-25 DIAGNOSIS — G8929 Other chronic pain: Secondary | ICD-10-CM | POA: Insufficient documentation

## 2023-07-25 DIAGNOSIS — M5136 Other intervertebral disc degeneration, lumbar region with discogenic back pain only: Secondary | ICD-10-CM | POA: Diagnosis not present

## 2023-07-25 DIAGNOSIS — M5431 Sciatica, right side: Secondary | ICD-10-CM | POA: Insufficient documentation

## 2023-07-25 DIAGNOSIS — M542 Cervicalgia: Secondary | ICD-10-CM | POA: Insufficient documentation

## 2023-07-25 DIAGNOSIS — M4716 Other spondylosis with myelopathy, lumbar region: Secondary | ICD-10-CM | POA: Insufficient documentation

## 2023-07-25 DIAGNOSIS — M62838 Other muscle spasm: Secondary | ICD-10-CM | POA: Insufficient documentation

## 2023-07-25 DIAGNOSIS — M545 Low back pain, unspecified: Secondary | ICD-10-CM | POA: Diagnosis not present

## 2023-07-25 MED ORDER — DEXAMETHASONE SODIUM PHOSPHATE 10 MG/ML IJ SOLN
10.0000 mg | Freq: Once | INTRAMUSCULAR | Status: AC
  Administered 2023-07-25: 10 mg

## 2023-07-25 MED ORDER — ROPIVACAINE HCL 2 MG/ML IJ SOLN
10.0000 mL | Freq: Once | INTRAMUSCULAR | Status: AC
  Administered 2023-07-25: 9 mL via EPIDURAL

## 2023-07-25 MED ORDER — TRAMADOL HCL 50 MG PO TABS: 50.0000 mg | ORAL_TABLET | Freq: Two times a day (BID) | ORAL | 2 refills | Status: DC | PRN

## 2023-07-25 MED ORDER — ROPIVACAINE HCL 2 MG/ML IJ SOLN
INTRAMUSCULAR | Status: AC
Start: 2023-07-25 — End: ?
  Filled 2023-07-25: qty 20

## 2023-07-25 MED ORDER — DEXAMETHASONE SODIUM PHOSPHATE 10 MG/ML IJ SOLN
INTRAMUSCULAR | Status: AC
  Filled 2023-07-25: qty 1

## 2023-07-25 NOTE — Progress Notes (Signed)
 Safety precautions to be maintained throughout the outpatient stay will include: orient to surroundings, keep bed in low position, maintain call bell within reach at all times, provide assistance with transfer out of bed and ambulation.

## 2023-07-25 NOTE — Progress Notes (Signed)
 Subjective:  Patient ID: Ellen [name], female    DOB: 10-26-1940  Age: 83 y.o. MRN: [id number]  CC: Neck Pain (left)   Procedure: Left trapezius trigger point injection x 1  Ellen [name] presents for reevaluation.  Ellen [name] has been having a lot of neck pain in addition to her baseline low back pain.  She has had previous trigger point injections for her trapezius muscle pain and has done well with those.  Today she is reporting some spasming starting in the mid cervical region with radiation into the left posterior shoulder and radiating up in the left lateral neck almost to the ear.  This causes a tension-like headache that is worse when she wakes up in the morning.  She has been active recently and feels that this may play into it.  She is taking her medication and was recently started on Flexeril  and takes ibuprofen  additionally but these have been limited in their benefit.  She has not utilized TENS unit or massage.  No reports of upper extremity weakness are noted.  It is mainly cervical posterior neck pain and associated tension headaches that are her primary problem in addition to her baseline low back pain.  Outpatient Medications Prior to Visit  Medication Sig Dispense Refill   amLODipine  (NORVASC ) 10 MG tablet Take 1 tablet (10 mg total) by mouth daily. 90 tablet 1   aspirin 81 MG tablet Take 81 mg by mouth daily.     atorvastatin  (LIPITOR) 20 MG tablet Take 1 tablet (20 mg total) by mouth daily. 90 tablet 1   blood glucose meter kit and supplies KIT Dispense based on patient and insurance preference. Use up to four times daily as directed. (FOR ICD-9 250.00, 250.01). 1 each 0   CALCIUM -MAGNESIUM-ZINC PO Take 1 tablet by mouth daily.     calcium -vitamin D 250-100 MG-UNIT tablet Take 1 tablet by mouth 2 (two) times daily.     chlorthalidone  (HYGROTON ) 25 MG tablet Take 1.5 tablets (37.5 mg total) by mouth daily. 135 tablet 1   Cholecalciferol (VITAMIN D3) 1000 units CAPS  Take 2 capsules by mouth daily.      cyclobenzaprine  (FLEXERIL ) 10 MG tablet Take 0.5-1 tablets (5-10 mg total) by mouth at bedtime as needed for muscle spasms. 30 tablet 1   ibuprofen  (ADVIL ) 800 MG tablet Take 1 tablet (800 mg total) by mouth every 8 (eight) hours as needed. 90 tablet 2   lisinopril  (ZESTRIL ) 40 MG tablet Take 1 tablet (40 mg total) by mouth daily. 90 tablet 0   Magnesium 500 MG TABS Take 1 tablet by mouth daily.     metFORMIN  (GLUCOPHAGE ) 500 MG tablet Take 1 tablet (500 MG) by mouth in morning with meal and 1 tablet (500 MG) in evening with meal. 180 tablet 1   potassium chloride  (KLOR-CON  M) 10 MEQ tablet Take 1 tablet (10 mEq total) by mouth daily. 3 tablet 0   vitamin C (ASCORBIC ACID) 500 MG tablet Take 500 mg by mouth daily.     No facility-administered medications prior to visit.    Review of Systems CNS: No confusion or sedation Cardiac: No angina or palpitations GI: No abdominal pain or constipation Constitutional: No nausea vomiting fevers or chills  Objective:  BP (!) 149/74   Pulse (!) 112   Temp 98.1 F (36.7 C)   Resp 18   Ht 5' 2 (1.575 m)   Wt 155 lb (70.3 kg)   LMP 11/28/1994 (Approximate)  SpO2 100%   BMI 28.35 kg/m    BP Readings from Last 3 Encounters:  07/25/23 (!) 149/74  07/24/23 132/83  06/25/23 135/79     Wt Readings from Last 3 Encounters:  07/25/23 155 lb (70.3 kg)  07/24/23 155 lb 3.2 oz (70.4 kg)  06/25/23 159 lb (72.1 kg)     Physical Exam Pt is alert and oriented PERRL EOMI HEART IS RRR no murmur or rub LCTA no wheezing or rales MUSCULOSKELETAL reveals a trigger point in the left mid body trapezius.  This does radiate via palpation up into the left lateral neck.  She has limited range of motion at the atlantooccipital joint with associated pain there.  Upper extremity strength appears to be intact with no deficits noted and good muscle tone and bulk.  Labs  Lab Results  Component Value Date   HGBA1C 6.4 (H)  05/28/2023   HGBA1C 6.3 (H) 02/26/2023   HGBA1C 6.3 (H) 11/23/2022   Lab Results  Component Value Date   MICROALBUR 10 02/26/2023   LDLCALC 73 05/28/2023   CREATININE 1.07 (H) 05/28/2023    -------------------------------------------------------------------------------------------------------------------- Lab Results  Component Value Date   WBC 7.9 08/28/2022   HGB 11.3 08/28/2022   HCT 34.3 08/28/2022   PLT 227 08/28/2022   GLUCOSE 81 05/28/2023   CHOL 155 05/28/2023   TRIG 72 05/28/2023   HDL 68 05/28/2023   LDLCALC 73 05/28/2023   ALT 15 05/28/2023   AST 24 05/28/2023   NA 138 05/28/2023   K 3.8 05/28/2023   CL 98 05/28/2023   CREATININE 1.07 (H) 05/28/2023   BUN 27 05/28/2023   CO2 26 05/28/2023   TSH 1.620 02/23/2021   HGBA1C 6.4 (H) 05/28/2023   MICROALBUR 10 02/26/2023    --------------------------------------------------------------------------------------------------------------------- No results found.   Assessment & Plan:   Ellen [name] was seen today for neck pain.  Diagnoses and all orders for this visit:  Spinal stenosis of lumbar region with neurogenic claudication  Degeneration of intervertebral disc of lumbar region with discogenic back pain  Cervicalgia  Sciatica of right side  Spondylosis, lumbar, with myelopathy  Chronic bilateral low back pain without sciatica  Muscle spasm of left shoulder  Other orders -     dexamethasone  (DECADRON ) injection 10 mg -     ropivacaine  (PF) 2 mg/mL (0.2%) (NAROPIN ) injection 10 mL -     traMADol  (ULTRAM ) 50 MG tablet; Take 1 tablet (50 mg total) by mouth every 12 (twelve) hours as needed.        ----------------------------------------------------------------------------------------------------------------------  Problem List Items Addressed This Visit       Unprioritized   Back pain   Relevant Medications   traMADol  (ULTRAM ) 50 MG tablet   Cervicalgia   Muscle spasm of left shoulder   Other  Visit Diagnoses       Spinal stenosis of lumbar region with neurogenic claudication    -  Primary   Relevant Medications   dexamethasone  (DECADRON ) injection 10 mg (Completed)   ropivacaine  (PF) 2 mg/mL (0.2%) (NAROPIN ) injection 10 mL (Completed)   traMADol  (ULTRAM ) 50 MG tablet     Degeneration of intervertebral disc of lumbar region with discogenic back pain       Relevant Medications   dexamethasone  (DECADRON ) injection 10 mg (Completed)   traMADol  (ULTRAM ) 50 MG tablet     Sciatica of right side         Spondylosis, lumbar, with myelopathy       Relevant Medications  dexamethasone  (DECADRON ) injection 10 mg (Completed)   traMADol  (ULTRAM ) 50 MG tablet         ----------------------------------------------------------------------------------------------------------------------  1. Spinal stenosis of lumbar region with neurogenic claudication (Primary) Continue core stretching strengthening exercises.  We will refill her tramadol  for once or twice a day dosing  2. Degeneration of intervertebral disc of lumbar region with discogenic back pain As above  3. Cervicalgia Will proceed with a trigger point injection to the left mid body trapezius today per request of the patient.  We gone over the risks and benefits of this.  Will schedule her for return to clinic in 1 month for possible repeat injection.  4. Sciatica of right side   5. Spondylosis, lumbar, with myelopathy   6. Chronic bilateral low back pain without sciatica   7. Muscle spasm of left shoulder As above and I have reviewed the [place]  practitioner database information and it is appropriate for refill of her tramadol  today.  She can continue with Flexeril  once a day ibuprofen  3 times a day and tramadol  once or twice a day for pain relief.    ----------------------------------------------------------------------------------------------------------------------  I am having Ellen BRAVO. Olivier start on  traMADol . I am also having her maintain her aspirin, Vitamin D3, Magnesium, calcium -vitamin D, ascorbic acid, CALCIUM -MAGNESIUM-ZINC PO, blood glucose meter kit and supplies, potassium chloride , chlorthalidone , amLODipine , atorvastatin , lisinopril , metFORMIN , cyclobenzaprine , and ibuprofen . We administered dexamethasone  and ropivacaine  (PF) 2 mg/mL (0.2%).   Meds ordered this encounter  Medications   dexamethasone  (DECADRON ) injection 10 mg   ropivacaine  (PF) 2 mg/mL (0.2%) (NAROPIN ) injection 10 mL   traMADol  (ULTRAM ) 50 MG tablet    Sig: Take 1 tablet (50 mg total) by mouth every 12 (twelve) hours as needed.    Dispense:  60 tablet    Refill:  2   Patient's Medications  New Prescriptions   TRAMADOL  (ULTRAM ) 50 MG TABLET    Take 1 tablet (50 mg total) by mouth every 12 (twelve) hours as needed.  Previous Medications   AMLODIPINE  (NORVASC ) 10 MG TABLET    Take 1 tablet (10 mg total) by mouth daily.   ASPIRIN 81 MG TABLET    Take 81 mg by mouth daily.   ATORVASTATIN  (LIPITOR) 20 MG TABLET    Take 1 tablet (20 mg total) by mouth daily.   BLOOD GLUCOSE METER KIT AND SUPPLIES KIT    Dispense based on patient and insurance preference. Use up to four times daily as directed. (FOR ICD-9 250.00, 250.01).   CALCIUM -MAGNESIUM-ZINC PO    Take 1 tablet by mouth daily.   CALCIUM -VITAMIN D 250-100 MG-UNIT TABLET    Take 1 tablet by mouth 2 (two) times daily.   CHLORTHALIDONE  (HYGROTON ) 25 MG TABLET    Take 1.5 tablets (37.5 mg total) by mouth daily.   CHOLECALCIFEROL (VITAMIN D3) 1000 UNITS CAPS    Take 2 capsules by mouth daily.    CYCLOBENZAPRINE  (FLEXERIL ) 10 MG TABLET    Take 0.5-1 tablets (5-10 mg total) by mouth at bedtime as needed for muscle spasms.   IBUPROFEN  (ADVIL ) 800 MG TABLET    Take 1 tablet (800 mg total) by mouth every 8 (eight) hours as needed.   LISINOPRIL  (ZESTRIL ) 40 MG TABLET    Take 1 tablet (40 mg total) by mouth daily.   MAGNESIUM 500 MG TABS    Take 1 tablet by mouth daily.    METFORMIN  (GLUCOPHAGE ) 500 MG TABLET    Take 1 tablet (500 MG) by mouth in  morning with meal and 1 tablet (500 MG) in evening with meal.   POTASSIUM CHLORIDE  (KLOR-CON  M) 10 MEQ TABLET    Take 1 tablet (10 mEq total) by mouth daily.   VITAMIN C (ASCORBIC ACID) 500 MG TABLET    Take 500 mg by mouth daily.  Modified Medications   No medications on file  Discontinued Medications   No medications on file   ----------------------------------------------------------------------------------------------------------------------  Follow-up: No follow-ups on file.  Trigger point injection: The area overlying the aforementioned trigger points were prepped with alcohol. They were then injected with a 25-gauge needle with 6 cc of ropivacaine  0.2% and Decadron  6 mg at each site after negative aspiration for heme. This was performed after informed consent was obtained and risks and benefits reviewed. She tolerated this procedure without difficulty and was convalesced and discharged to home in stable condition for follow-up as mentioned.  @Argus Caraher  Myra, MD@  Lynwood KANDICE Myra, MD

## 2023-07-26 ENCOUNTER — Telehealth: Payer: Self-pay

## 2023-07-26 NOTE — Telephone Encounter (Signed)
 Post procedure follow up.  LM

## 2023-07-30 ENCOUNTER — Telehealth: Payer: Self-pay | Admitting: Anesthesiology

## 2023-07-30 NOTE — Telephone Encounter (Signed)
 PT called stated that since the procedure she has been having a burning sensational on her tongue. Please give patient a call. TY

## 2023-07-30 NOTE — Telephone Encounter (Signed)
 Reports burning on the tip of the tongue since the TPI on 07-25-23. I advised her that this may not be related to the procedure, but could possibly be related to the meds given. She has not taken Benadryl. I advised her to let us  know if anything changes.

## 2023-08-27 ENCOUNTER — Encounter: Payer: Self-pay | Admitting: Nurse Practitioner

## 2023-08-27 ENCOUNTER — Ambulatory Visit (INDEPENDENT_AMBULATORY_CARE_PROVIDER_SITE_OTHER): Payer: Medicare Other | Admitting: Nurse Practitioner

## 2023-08-27 VITALS — BP 128/74 | HR 75 | Ht 62.0 in | Wt 150.6 lb

## 2023-08-27 DIAGNOSIS — E119 Type 2 diabetes mellitus without complications: Secondary | ICD-10-CM | POA: Diagnosis not present

## 2023-08-27 DIAGNOSIS — R809 Proteinuria, unspecified: Secondary | ICD-10-CM | POA: Diagnosis not present

## 2023-08-27 DIAGNOSIS — I152 Hypertension secondary to endocrine disorders: Secondary | ICD-10-CM | POA: Diagnosis not present

## 2023-08-27 DIAGNOSIS — Z6831 Body mass index (BMI) 31.0-31.9, adult: Secondary | ICD-10-CM

## 2023-08-27 DIAGNOSIS — Z7985 Long-term (current) use of injectable non-insulin antidiabetic drugs: Secondary | ICD-10-CM

## 2023-08-27 DIAGNOSIS — E6609 Other obesity due to excess calories: Secondary | ICD-10-CM

## 2023-08-27 DIAGNOSIS — E785 Hyperlipidemia, unspecified: Secondary | ICD-10-CM | POA: Diagnosis not present

## 2023-08-27 DIAGNOSIS — E1169 Type 2 diabetes mellitus with other specified complication: Secondary | ICD-10-CM

## 2023-08-27 DIAGNOSIS — I700 Atherosclerosis of aorta: Secondary | ICD-10-CM | POA: Diagnosis not present

## 2023-08-27 DIAGNOSIS — E1159 Type 2 diabetes mellitus with other circulatory complications: Secondary | ICD-10-CM

## 2023-08-27 DIAGNOSIS — E66811 Obesity, class 1: Secondary | ICD-10-CM

## 2023-08-27 DIAGNOSIS — E1129 Type 2 diabetes mellitus with other diabetic kidney complication: Secondary | ICD-10-CM | POA: Diagnosis not present

## 2023-08-27 MED ORDER — AMLODIPINE BESYLATE 10 MG PO TABS: 10.0000 mg | ORAL_TABLET | Freq: Every day | ORAL | 1 refills | Status: DC

## 2023-08-27 MED ORDER — CHLORTHALIDONE 25 MG PO TABS: 37.5000 mg | ORAL_TABLET | Freq: Every day | ORAL | 1 refills | Status: DC

## 2023-08-27 MED ORDER — CHLORTHALIDONE 25 MG PO TABS: 25.0000 mg | ORAL_TABLET | Freq: Every day | ORAL | 1 refills | Status: DC

## 2023-08-27 MED ORDER — ATORVASTATIN CALCIUM 20 MG PO TABS: 20.0000 mg | ORAL_TABLET | Freq: Every day | ORAL | 1 refills | Status: DC

## 2023-08-27 MED ORDER — METFORMIN HCL 500 MG PO TABS
ORAL_TABLET | ORAL | 1 refills | Status: DC
Start: 2023-08-27 — End: 2024-02-27

## 2023-08-27 MED ORDER — LISINOPRIL 40 MG PO TABS: 40.0000 mg | ORAL_TABLET | Freq: Every day | ORAL | 1 refills | Status: DC

## 2023-08-27 NOTE — Assessment & Plan Note (Signed)
Chronic.  Controlled.  Continue with current medication regimen of Atorvastatin 20mg.  Labs ordered today.  Return to clinic in 3 months for reevaluation.  Call sooner if concerns arise.   

## 2023-08-27 NOTE — Progress Notes (Signed)
 BP 128/74 (BP Location: Right Arm, Patient Position: Sitting, Cuff Size: Large)   Pulse 75   Ht 5\' 2"  (1.575 m)   Wt 150 lb 9.6 oz (68.3 kg)   LMP 11/28/1994 (Approximate)   SpO2 98%   BMI 27.55 kg/m    Subjective:    Patient ID: Ellen [name], female  DOB: 04/05/1941, 83 y.o.   MRN: [id number]  HPI: Ellen [name] is a 83 y.o. female  Chief Complaint  Patient presents with   Hypertension   Hyperlipidemia   Diabetes   HYPERTENSION without Chronic Kidney Disease She is taking Amlodipine 10 mg, Lisinopril 40 mg, Chlorthalidone 37.5 mg.  Hypertension status: stable  Satisfied with current treatment? yes Duration of hypertension: chronic BP monitoring frequency:  daily BP range: 126/75 BP medication side effects:  no Medication compliance: excellent compliance Aspirin: yes Recurrent headaches: no Visual changes: no Palpitations: no Dyspnea: no Chest pain: no Lower extremity edema: no Dizzy/lightheaded: no   DIABETES A1C today 6.4% at goal. She is taking Metformin 500 BID.  She states she has been drinking more water, however, her Cr was up a little at the last visit.  Polydipsia/polyuria: no Visual disturbance: yes- done at [facility] Chest pain: no Paresthesias: yes in the morning has some numbness in thumbs for a little while that eventually goes away Blood Pressure Monitoring: daily She does not check sugars at home.    Patient was seen by Dr. Pernell Dupre at the pain clinic where she received a trigger point injection in her trapezius.  She denies any pain today and doing well. She is no longer taking the Tramadol and Flexeril.    Relevant past medical, surgical, family and social history reviewed and updated as indicated. Interim medical history since our last visit reviewed. Allergies and medications reviewed and updated.  Review of Systems  Eyes:  Negative for visual disturbance.  Respiratory:  Negative for shortness of breath.   Cardiovascular:   Negative for chest pain, palpitations and leg swelling.  Endocrine: Negative for polydipsia, polyphagia and polyuria.  Genitourinary:  Negative for frequency.  Neurological:  Negative for dizziness, light-headedness, numbness and headaches.   Per HPI unless specifically indicated above     Objective:    BP 128/74 (BP Location: Right Arm, Patient Position: Sitting, Cuff Size: Large)   Pulse 75   Ht 5\' 2"  (1.575 m)   Wt 150 lb 9.6 oz (68.3 kg)   LMP 11/28/1994 (Approximate)   SpO2 98%   BMI 27.55 kg/m   Wt Readings from Last 3 Encounters:  08/27/23 150 lb 9.6 oz (68.3 kg)  07/25/23 155 lb (70.3 kg)  07/24/23 155 lb 3.2 oz (70.4 kg)    Physical Exam Vitals and nursing note reviewed.  Constitutional:      General: She is awake. She is not in acute distress.    Appearance: Normal appearance. She is well-developed and well-groomed. She is not ill-appearing.  HENT:     Head: Normocephalic and atraumatic.     Right Ear: Hearing and external ear normal. No drainage.     Left Ear: Hearing and external ear normal. No drainage.     Nose: Nose normal.  Eyes:     General: Lids are normal.        Right eye: No discharge.        Left eye: No discharge.     Conjunctiva/sclera: Conjunctivae normal.  Cardiovascular:     Rate and Rhythm: Normal rate and regular  rhythm.     Pulses:          Radial pulses are 2+ on the right side and 2+ on the left side.       Posterior tibial pulses are 2+ on the right side and 2+ on the left side.     Heart sounds: Normal heart sounds, S1 normal and S2 normal. No murmur heard.    No gallop.  Pulmonary:     Effort: Pulmonary effort is normal. No accessory muscle usage or respiratory distress.     Breath sounds: Normal breath sounds.  Musculoskeletal:        General: Normal range of motion.     Cervical back: Full passive range of motion without pain and normal range of motion.     Right lower leg: No edema.     Left lower leg: No edema.  Skin:     General: Skin is warm and dry.     Capillary Refill: Capillary refill takes less than 2 seconds.  Neurological:     Mental Status: She is alert and oriented to person, place, and time.  Psychiatric:        Attention and Perception: Attention normal.        Mood and Affect: Mood normal.        Speech: Speech normal.        Behavior: Behavior normal. Behavior is cooperative.        Thought Content: Thought content normal.     Results for orders placed or performed in visit on 05/28/23  Lipid Profile   Collection Time: 05/28/23  8:26 AM  Result Value Ref Range   Cholesterol, Total 155 100 - 199 mg/dL   Triglycerides 72 0 - 149 mg/dL   HDL 68 >86 mg/dL   VLDL Cholesterol Cal 14 5 - 40 mg/dL   LDL Chol Calc (NIH) 73 0 - 99 mg/dL   Chol/HDL Ratio 2.3 0.0 - 4.4 ratio  Comp Met (CMET)   Collection Time: 05/28/23  8:26 AM  Result Value Ref Range   Glucose 81 70 - 99 mg/dL   BUN 27 8 - 27 mg/dL   Creatinine, Ser 5.78 (H) 0.57 - 1.00 mg/dL   eGFR 53 (L) >46 NG/EXB/2.84   BUN/Creatinine Ratio 25 12 - 28   Sodium 138 134 - 144 mmol/L   Potassium 3.8 3.5 - 5.2 mmol/L   Chloride 98 96 - 106 mmol/L   CO2 26 20 - 29 mmol/L   Calcium 10.0 8.7 - 10.3 mg/dL   Total Protein 6.9 6.0 - 8.5 g/dL   Albumin 4.4 3.8 - 4.8 g/dL   Globulin, Total 2.5 1.5 - 4.5 g/dL   Bilirubin Total <1.3 0.0 - 1.2 mg/dL   Alkaline Phosphatase 71 44 - 121 IU/L   AST 24 0 - 40 IU/L   ALT 15 0 - 32 IU/L  HgB A1c   Collection Time: 05/28/23  8:26 AM  Result Value Ref Range   Hgb A1c MFr Bld 6.4 (H) 4.8 - 5.6 %   Est. average glucose Bld gHb Est-mCnc 137 mg/dL      Assessment & Plan:   Problem List Items Addressed This Visit       Cardiovascular and Mediastinum   Hypertension associated with diabetes (HCC) - Primary   Chronic, Controlled.  Continue Amlodipine 10 mg, Lisinopril 40 mg, Chlorthalidone 37.5 mg. Will decrease dose of Chlothalidone to 25mg .  Need to monitor potassium levels.  Recommend continue  checking BP  at home, 150 mins exercise weekly, and DASH diet. Refills given. Return in 3 months.      Relevant Medications   amLODipine (NORVASC) 10 MG tablet   atorvastatin (LIPITOR) 20 MG tablet   lisinopril (ZESTRIL) 40 MG tablet   metFORMIN (GLUCOPHAGE) 500 MG tablet   chlorthalidone (HYGROTON) 25 MG tablet   Other Relevant Orders   Comprehensive metabolic panel   Atherosclerosis of aorta (HCC)   Chronic.  Controlled.  Continue with current medication regimen of Atorvastatin 20mg  and ASA.  Refills sent today.  Labs ordered today.  Return to clinic in 3 months for reevaluation.  Call sooner if concerns arise.       Relevant Medications   amLODipine (NORVASC) 10 MG tablet   atorvastatin (LIPITOR) 20 MG tablet   lisinopril (ZESTRIL) 40 MG tablet   chlorthalidone (HYGROTON) 25 MG tablet     Endocrine   Type 2 diabetes mellitus with proteinuria (HCC)   Chronic.  Remains well controlled.  Last A1c was 6.4%.  Labs ordered today.  Controlled on Metformin 500mg  BID. A1c has been steadily increasing over the last year.  Discussed with patient at visit today.  Not currently checking sugars. Eye exam done at [facility]- will request results.  Will make recommendations based on lab results. Follow up in 3 months.  Call sooner if concerns arise.       Relevant Medications   atorvastatin (LIPITOR) 20 MG tablet   lisinopril (ZESTRIL) 40 MG tablet   metFORMIN (GLUCOPHAGE) 500 MG tablet   Other Relevant Orders   Hemoglobin A1c   Hyperlipidemia associated with type 2 diabetes mellitus (HCC)   Chronic.  Controlled.  Continue with current medication regimen of Atorvastatin 20mg .  Labs ordered today.  Return to clinic in 3 months for reevaluation.  Call sooner if concerns arise.       Relevant Medications   amLODipine (NORVASC) 10 MG tablet   atorvastatin (LIPITOR) 20 MG tablet   lisinopril (ZESTRIL) 40 MG tablet   metFORMIN (GLUCOPHAGE) 500 MG tablet   chlorthalidone (HYGROTON) 25 MG tablet    Diabetes mellitus treated with injections of non-insulin medication (HCC)   Relevant Medications   atorvastatin (LIPITOR) 20 MG tablet   lisinopril (ZESTRIL) 40 MG tablet   metFORMIN (GLUCOPHAGE) 500 MG tablet     Other   Obesity   Relevant Medications   metFORMIN (GLUCOPHAGE) 500 MG tablet     Follow up plan: Return in about 3 months (around 11/27/2023) for HTN, HLD, DM2 FU.

## 2023-08-27 NOTE — Assessment & Plan Note (Signed)
 Chronic, Controlled.  Continue Amlodipine 10 mg, Lisinopril 40 mg, Chlorthalidone 37.5 mg. Will decrease dose of Chlothalidone to 25mg .  Need to monitor potassium levels.  Recommend continue checking BP at home, 150 mins exercise weekly, and DASH diet. Refills given. Return in 3 months.

## 2023-08-27 NOTE — Assessment & Plan Note (Signed)
 Chronic.  Remains well controlled.  Last A1c was 6.4%.  Labs ordered today.  Controlled on Metformin 500mg  BID. A1c has been steadily increasing over the last year.  Discussed with patient at visit today.  Not currently checking sugars. Eye exam done at [facility]- will request results.  Will make recommendations based on lab results. Follow up in 3 months.  Call sooner if concerns arise.

## 2023-08-27 NOTE — Assessment & Plan Note (Addendum)
 Chronic.  Controlled.  Continue with current medication regimen of Atorvastatin 20mg  and ASA.  Refills sent today.  Labs ordered today.  Return to clinic in 3 months for reevaluation.  Call sooner if concerns arise.

## 2023-08-28 ENCOUNTER — Encounter: Payer: Self-pay | Admitting: Nurse Practitioner

## 2023-08-28 LAB — HEMOGLOBIN A1C
Est. average glucose Bld gHb Est-mCnc: 137 mg/dL
Hgb A1c MFr Bld: 6.4 % — ABNORMAL HIGH (ref 4.8–5.6)

## 2023-08-28 LAB — COMPREHENSIVE METABOLIC PANEL
ALT: 12 IU/L (ref 0–32)
AST: 21 IU/L (ref 0–40)
Albumin: 4 g/dL (ref 3.8–4.8)
Alkaline Phosphatase: 62 IU/L (ref 44–121)
BUN/Creatinine Ratio: 26 (ref 12–28)
BUN: 29 mg/dL — ABNORMAL HIGH (ref 8–27)
Bilirubin Total: 0.2 mg/dL (ref 0.0–1.2)
CO2: 25 mmol/L (ref 20–29)
Calcium: 10 mg/dL (ref 8.7–10.3)
Chloride: 98 mmol/L (ref 96–106)
Creatinine, Ser: 1.1 mg/dL — ABNORMAL HIGH (ref 0.57–1.00)
Globulin, Total: 3 g/dL (ref 1.5–4.5)
Glucose: 96 mg/dL (ref 70–99)
Potassium: 3.4 mmol/L — ABNORMAL LOW (ref 3.5–5.2)
Sodium: 138 mmol/L (ref 134–144)
Total Protein: 7 g/dL (ref 6.0–8.5)
eGFR: 51 mL/min/{1.73_m2} — ABNORMAL LOW (ref >59)

## 2023-11-27 ENCOUNTER — Encounter: Payer: Self-pay | Admitting: Nurse Practitioner

## 2023-11-27 ENCOUNTER — Ambulatory Visit (INDEPENDENT_AMBULATORY_CARE_PROVIDER_SITE_OTHER): Admitting: Nurse Practitioner

## 2023-11-27 VITALS — BP 147/78 | HR 76 | Ht 62.0 in | Wt 151.0 lb

## 2023-11-27 DIAGNOSIS — E1169 Type 2 diabetes mellitus with other specified complication: Secondary | ICD-10-CM | POA: Diagnosis not present

## 2023-11-27 DIAGNOSIS — E1159 Type 2 diabetes mellitus with other circulatory complications: Secondary | ICD-10-CM | POA: Diagnosis not present

## 2023-11-27 DIAGNOSIS — E119 Type 2 diabetes mellitus without complications: Secondary | ICD-10-CM | POA: Diagnosis not present

## 2023-11-27 DIAGNOSIS — Z7985 Long-term (current) use of injectable non-insulin antidiabetic drugs: Secondary | ICD-10-CM

## 2023-11-27 DIAGNOSIS — R809 Proteinuria, unspecified: Secondary | ICD-10-CM | POA: Diagnosis not present

## 2023-11-27 DIAGNOSIS — I152 Hypertension secondary to endocrine disorders: Secondary | ICD-10-CM

## 2023-11-27 DIAGNOSIS — I700 Atherosclerosis of aorta: Secondary | ICD-10-CM

## 2023-11-27 DIAGNOSIS — E1129 Type 2 diabetes mellitus with other diabetic kidney complication: Secondary | ICD-10-CM | POA: Diagnosis not present

## 2023-11-27 DIAGNOSIS — E785 Hyperlipidemia, unspecified: Secondary | ICD-10-CM | POA: Diagnosis not present

## 2023-11-27 NOTE — Progress Notes (Signed)
 BP (!) 147/78   Pulse 76   Ht 5\' 2"  (1.575 m)   Wt 151 lb (68.5 kg)   LMP 11/28/1994 (Approximate)   BMI 27.62 kg/m    Subjective:    Patient ID: Ellen [name], female  DOB: [birth year], 83 y.o.   MRN: [id number]  HPI: Ellen [name] is a 83 y.o. female  Chief Complaint  Patient presents with   Medical Management of Chronic Issues   HYPERTENSION without Chronic Kidney Disease She is taking Amlodipine  10 mg, Lisinopril  40 mg, Chlorthalidone  37.5 mg.  Hypertension status: stable  Satisfied with current treatment? yes Duration of hypertension: chronic BP monitoring frequency:  not checking BP range:  BP medication side effects:  no Medication compliance: excellent compliance Aspirin: yes Recurrent headaches: no Visual changes: no Palpitations: no Dyspnea: no Chest pain: no Lower extremity edema: no Dizzy/lightheaded: no   DIABETES A1C today 6.4% at goal. She is taking Metformin  500 BID.   Cr has been elevated at last visit.   Polydipsia/polyuria: no Visual disturbance: yes- done at [facility] Chest pain: no Paresthesias: yes in the morning has some numbness in thumbs for a little while that eventually goes away Blood Pressure Monitoring: daily She does not check sugars at home.        Relevant past medical, surgical, family and social history reviewed and updated as indicated. Interim medical history since our last visit reviewed. Allergies and medications reviewed and updated.  Review of Systems  Eyes:  Negative for visual disturbance.  Respiratory:  Negative for shortness of breath.   Cardiovascular:  Negative for chest pain, palpitations and leg swelling.  Endocrine: Negative for polydipsia, polyphagia and polyuria.  Genitourinary:  Negative for frequency.  Neurological:  Negative for dizziness, light-headedness, numbness and headaches.   Per HPI unless specifically indicated above     Objective:    BP (!) 147/78   Pulse 76   Ht 5\' 2"   (1.575 m)   Wt 151 lb (68.5 kg)   LMP 11/28/1994 (Approximate)   BMI 27.62 kg/m   Wt Readings from Last 3 Encounters:  11/27/23 151 lb (68.5 kg)  08/27/23 150 lb 9.6 oz (68.3 kg)  07/25/23 155 lb (70.3 kg)    Physical Exam Vitals and nursing note reviewed.  Constitutional:      General: She is awake. She is not in acute distress.    Appearance: Normal appearance. She is well-developed and well-groomed. She is not ill-appearing.  HENT:     Head: Normocephalic and atraumatic.     Right Ear: Hearing and external ear normal. No drainage.     Left Ear: Hearing and external ear normal. No drainage.     Nose: Nose normal.  Eyes:     General: Lids are normal.        Right eye: No discharge.        Left eye: No discharge.     Conjunctiva/sclera: Conjunctivae normal.  Cardiovascular:     Rate and Rhythm: Normal rate and regular rhythm.     Pulses:          Radial pulses are 2+ on the right side and 2+ on the left side.       Posterior tibial pulses are 2+ on the right side and 2+ on the left side.     Heart sounds: Normal heart sounds, S1 normal and S2 normal. No murmur heard.    No gallop.  Pulmonary:     Effort: Pulmonary  effort is normal. No accessory muscle usage or respiratory distress.     Breath sounds: Normal breath sounds.  Musculoskeletal:        General: Normal range of motion.     Cervical back: Full passive range of motion without pain and normal range of motion.     Right lower leg: No edema.     Left lower leg: No edema.  Skin:    General: Skin is warm and dry.     Capillary Refill: Capillary refill takes less than 2 seconds.  Neurological:     Mental Status: She is alert and oriented to person, place, and time.  Psychiatric:        Attention and Perception: Attention normal.        Mood and Affect: Mood normal.        Speech: Speech normal.        Behavior: Behavior normal. Behavior is cooperative.        Thought Content: Thought content normal.     Results  for orders placed or performed in visit on 08/27/23  Comprehensive metabolic panel   Collection Time: 08/27/23  9:18 AM  Result Value Ref Range   Glucose 96 70 - 99 mg/dL   BUN 29 (H) 8 - 27 mg/dL   Creatinine, Ser 9.14 (H) 0.57 - 1.00 mg/dL   eGFR 51 (L) >78 GN/FAO/1.30   BUN/Creatinine Ratio 26 12 - 28   Sodium 138 134 - 144 mmol/L   Potassium 3.4 (L) 3.5 - 5.2 mmol/L   Chloride 98 96 - 106 mmol/L   CO2 25 20 - 29 mmol/L   Calcium  10.0 8.7 - 10.3 mg/dL   Total Protein 7.0 6.0 - 8.5 g/dL   Albumin 4.0 3.8 - 4.8 g/dL   Globulin, Total 3.0 1.5 - 4.5 g/dL   Bilirubin Total 0.2 0.0 - 1.2 mg/dL   Alkaline Phosphatase 62 44 - 121 IU/L   AST 21 0 - 40 IU/L   ALT 12 0 - 32 IU/L  Hemoglobin A1c   Collection Time: 08/27/23  9:18 AM  Result Value Ref Range   Hgb A1c MFr Bld 6.4 (H) 4.8 - 5.6 %   Est. average glucose Bld gHb Est-mCnc 137 mg/dL      Assessment & Plan:   Problem List Items Addressed This Visit       Cardiovascular and Mediastinum   Hypertension associated with diabetes (HCC)   Chronic.  Elevated at visit today.  Had salt with her watermelon this morning.  Recommend checking blood pressures at home and bringing log to next visit.  Can consider adjusting medications at that time.  If still elevated, will add SGLT2 .  Labs ordered today.  Follow up in 1 month.      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  Remains well controlled.  Last A1c was 6.4%.  Labs ordered today.  Controlled on Metformin  500mg  BID.  Cr has increased slightly over the last two visits- may need to consider adding SGLT2 to regimen.  Eye exam done at [facility]- will request results.  Will make recommendations based on lab results. Follow up in 3 months.  Call sooner if concerns arise.       Relevant Orders   Hemoglobin A1c   Comprehensive metabolic panel with GFR   Hyperlipidemia associated with type 2 diabetes mellitus  (HCC)   Chronic.  Controlled.  Continue with current medication regimen of Atorvastatin  20mg .  Labs ordered today.  Return to clinic in 3 months for reevaluation.  Call sooner if concerns arise.       Relevant Orders   Comprehensive metabolic panel with GFR   Lipid panel   Diabetes mellitus treated with injections of non-insulin medication (HCC)     Follow up plan: Return in about 1 month (around 12/27/2023) for BP Check.

## 2023-11-27 NOTE — Assessment & Plan Note (Signed)
 Chronic.  Remains well controlled.  Last A1c was 6.4%.  Labs ordered today.  Controlled on Metformin  500mg  BID.  Cr has increased slightly over the last two visits- may need to consider adding SGLT2 to regimen.  Eye exam done at [facility]- will request results.  Will make recommendations based on lab results. Follow up in 3 months.  Call sooner if concerns arise.

## 2023-11-27 NOTE — Assessment & Plan Note (Signed)
 Chronic.  Elevated at visit today.  Had salt with her watermelon this morning.  Recommend checking blood pressures at home and bringing log to next visit.  Can consider adjusting medications at that time.  If still elevated, will add SGLT2 .  Labs ordered today.  Follow up in 1 month.

## 2023-11-27 NOTE — Assessment & Plan Note (Signed)
Chronic.  Controlled.  Continue with current medication regimen of Atorvastatin 20mg.  Labs ordered today.  Return to clinic in 3 months for reevaluation.  Call sooner if concerns arise.   

## 2023-11-28 ENCOUNTER — Ambulatory Visit: Payer: Self-pay | Admitting: Nurse Practitioner

## 2023-11-28 LAB — COMPREHENSIVE METABOLIC PANEL WITH GFR
ALT: 17 IU/L (ref 0–32)
AST: 20 IU/L (ref 0–40)
Albumin: 4.4 g/dL (ref 3.8–4.8)
Alkaline Phosphatase: 64 IU/L (ref 44–121)
BUN/Creatinine Ratio: 28 (ref 12–28)
BUN: 29 mg/dL — ABNORMAL HIGH (ref 8–27)
Bilirubin Total: <0.2 mg/dL (ref 0.0–1.2)
CO2: 23 mmol/L (ref 20–29)
Calcium: 10 mg/dL (ref 8.7–10.3)
Chloride: 100 mmol/L (ref 96–106)
Creatinine, Ser: 1.04 mg/dL — ABNORMAL HIGH (ref 0.57–1.00)
Globulin, Total: 2.5 g/dL (ref 1.5–4.5)
Glucose: 104 mg/dL — ABNORMAL HIGH (ref 70–99)
Potassium: 3.6 mmol/L (ref 3.5–5.2)
Sodium: 139 mmol/L (ref 134–144)
Total Protein: 6.9 g/dL (ref 6.0–8.5)
eGFR: 54 mL/min/{1.73_m2} — ABNORMAL LOW (ref >59)

## 2023-11-28 LAB — LIPID PANEL
Chol/HDL Ratio: 2.6 ratio (ref 0.0–4.4)
Cholesterol, Total: 162 mg/dL (ref 100–199)
HDL: 62 mg/dL (ref >39)
LDL Chol Calc (NIH): 84 mg/dL (ref 0–99)
Triglycerides: 86 mg/dL (ref 0–149)
VLDL Cholesterol Cal: 16 mg/dL (ref 5–40)

## 2023-11-28 LAB — HEMOGLOBIN A1C
Est. average glucose Bld gHb Est-mCnc: 128 mg/dL
Hgb A1c MFr Bld: 6.1 % — ABNORMAL HIGH (ref 4.8–5.6)

## 2023-12-27 ENCOUNTER — Encounter: Payer: Self-pay | Admitting: Nurse Practitioner

## 2023-12-27 ENCOUNTER — Ambulatory Visit: Admitting: Nurse Practitioner

## 2023-12-27 VITALS — BP 133/76 | HR 75 | Temp 97.3°F | Ht 62.0 in | Wt 152.4 lb

## 2023-12-27 DIAGNOSIS — H9193 Unspecified hearing loss, bilateral: Secondary | ICD-10-CM

## 2023-12-27 DIAGNOSIS — E1159 Type 2 diabetes mellitus with other circulatory complications: Secondary | ICD-10-CM | POA: Diagnosis not present

## 2023-12-27 DIAGNOSIS — H6123 Impacted cerumen, bilateral: Secondary | ICD-10-CM

## 2023-12-27 DIAGNOSIS — I152 Hypertension secondary to endocrine disorders: Secondary | ICD-10-CM | POA: Diagnosis not present

## 2023-12-27 NOTE — Assessment & Plan Note (Signed)
 Chronic.  Improved.  She has decreased her salt intake and her blood pressure improved.  Recommend checking blood pressures at home and bringing log to next visit.  Can consider adjusting medications at that time.  If still elevated, will add SGLT2 .  Follow up in 2 months.

## 2023-12-27 NOTE — Progress Notes (Signed)
 BP 133/76   Pulse 75   Temp (!) 97.3 F (36.3 C) (Oral)   Ht 5' 2 (1.575 m)   Wt 152 lb 6.4 oz (69.1 kg)   LMP 11/28/1994 (Approximate)   SpO2 97%   BMI 27.87 kg/m    Subjective:    Patient ID: Ellen [name] Retort, female    DOB: [birth year], 83 y.o.   MRN: [id number]  HPI: Ellen [name] is a 83 y.o. 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 She stopped adding salt to her food and blood pressure improved.   Hypertension status: stable  Satisfied with current treatment? yes Duration of hypertension: chronic BP monitoring frequency:  not checking BP range:  BP medication side effects:  no Medication compliance: excellent compliance Aspirin: yes Recurrent headaches: no Visual changes: no Palpitations: no Dyspnea: no Chest pain: no Lower extremity edema: no Dizzy/lightheaded: no    Relevant past medical, surgical, family and social history reviewed and updated as indicated. Interim medical history since our last visit reviewed. Allergies and medications reviewed and updated.  Review of Systems  HENT:  Positive for hearing loss.   Eyes:  Negative for visual disturbance.  Respiratory:  Negative for cough, chest tightness and shortness of breath.   Cardiovascular:  Negative for chest pain, palpitations and leg swelling.  Neurological:  Negative for dizziness and headaches.    Per HPI unless specifically indicated above     Objective:    BP 133/76   Pulse 75   Temp (!) 97.3 F (36.3 C) (Oral)   Ht 5' 2 (1.575 m)   Wt 152 lb 6.4 oz (69.1 kg)   LMP 11/28/1994 (Approximate)   SpO2 97%   BMI 27.87 kg/m   Wt Readings from Last 3 Encounters:  12/27/23 152 lb 6.4 oz (69.1 kg)  11/27/23 151 lb (68.5 kg)  08/27/23 150 lb 9.6 oz (68.3 kg)    Physical Exam Vitals and nursing note reviewed.  Constitutional:       General: She is not in acute distress.    Appearance: Normal appearance. She is normal weight. She is not ill-appearing, toxic-appearing or diaphoretic.  HENT:     Head: Normocephalic.     Right Ear: External ear normal. There is impacted cerumen.     Left Ear: External ear normal. There is impacted cerumen.     Nose: Nose normal.     Mouth/Throat:     Mouth: Mucous membranes are moist.     Pharynx: Oropharynx is clear.  Eyes:     General:        Right eye: No discharge.        Left eye: No discharge.     Extraocular Movements: Extraocular movements intact.     Conjunctiva/sclera: Conjunctivae normal.     Pupils: Pupils are equal, round, and reactive to light.  Cardiovascular:     Rate and Rhythm: Normal rate and regular rhythm.     Heart sounds: No murmur heard. Pulmonary:     Effort: Pulmonary effort is normal. No respiratory distress.     Breath sounds: Normal breath sounds. No wheezing or rales.  Musculoskeletal:     Cervical back: Normal range of motion and neck supple.  Skin:    General: Skin is warm and dry.     Capillary Refill: Capillary refill takes less than 2 seconds.  Neurological:     General: No focal deficit present.     Mental Status: She is alert and oriented to person, place, and time. Mental status is at baseline.  Psychiatric:        Mood and Affect: Mood normal.        Behavior: Behavior normal.        Thought Content: Thought content normal.        Judgment: Judgment normal.     Results for orders placed or performed in visit on 11/27/23  HM DIABETES EYE EXAM   Collection Time: 03/26/23  9:48 AM  Result Value Ref Range   HM Diabetic Eye Exam        Assessment & Plan:   Problem List Items Addressed This Visit       Cardiovascular and Mediastinum   Hypertension associated with diabetes (HCC) - Primary   Chronic.  Improved.  She has decreased her salt intake and her blood pressure improved.  Recommend checking blood pressures at home and  bringing log to next visit.  Can consider adjusting medications at that time.  If still elevated, will add SGLT2 .  Follow up in 2 months.      Other Visit Diagnoses       Decreased hearing of both ears       Relevant Orders   Ambulatory referral to ENT     Bilateral impacted cerumen       Unable to remove all of the Cerumen. Will refer to ENT for further management.   Relevant Orders   Ear Lavage        Follow up plan: Return in about 2 months (around 02/27/2024) for HTN, HLD, DM2 FU.

## 2024-01-03 ENCOUNTER — Other Ambulatory Visit: Payer: Self-pay | Admitting: Nurse Practitioner

## 2024-01-03 DIAGNOSIS — I152 Hypertension secondary to endocrine disorders: Secondary | ICD-10-CM

## 2024-01-04 NOTE — Telephone Encounter (Signed)
 Requested Prescriptions  Pending Prescriptions Disp Refills   chlorthalidone  (HYGROTON ) 25 MG tablet [Pharmacy Med Name: Chlorthalidone  25 MG Oral Tablet] 135 tablet 0    Sig: TAKE 1 & 1/2 (ONE & ONE-HALF) TABLETS BY MOUTH ONCE DAILY     Cardiovascular: Diuretics - Thiazide Failed - 01/04/2024  2:45 PM      Failed - Cr in normal range and within 180 days    Creatinine, Ser  Date Value Ref Range Status  11/27/2023 1.04 (H) 0.57 - 1.00 mg/dL Final         Passed - K in normal range and within 180 days    Potassium  Date Value Ref Range Status  11/27/2023 3.6 3.5 - 5.2 mmol/L Final         Passed - Na in normal range and within 180 days    Sodium  Date Value Ref Range Status  11/27/2023 139 134 - 144 mmol/L Final         Passed - Last BP in normal range    BP Readings from Last 1 Encounters:  12/27/23 133/76         Passed - Valid encounter within last 6 months    Recent Outpatient Visits           1 week ago Hypertension associated with diabetes [facility])   [place] [facility] Melvin Pao, NP   1 month ago Type 2 diabetes mellitus with proteinuria [facility])   [place] [facility] Melvin Pao, NP   4 months ago Hypertension associated with diabetes [facility])   [facility] Melvin Pao, NP   5 months ago Neck pain   [place] [facility] [place], [place], DO

## 2024-02-15 DIAGNOSIS — H6063 Unspecified chronic otitis externa, bilateral: Secondary | ICD-10-CM | POA: Diagnosis not present

## 2024-02-15 DIAGNOSIS — H6123 Impacted cerumen, bilateral: Secondary | ICD-10-CM | POA: Diagnosis not present

## 2024-02-27 ENCOUNTER — Ambulatory Visit (INDEPENDENT_AMBULATORY_CARE_PROVIDER_SITE_OTHER): Admitting: Nurse Practitioner

## 2024-02-27 ENCOUNTER — Encounter: Payer: Self-pay | Admitting: Nurse Practitioner

## 2024-02-27 VITALS — BP 151/74 | HR 79 | Temp 98.0°F | Ht 62.0 in | Wt 152.6 lb

## 2024-02-27 DIAGNOSIS — R809 Proteinuria, unspecified: Secondary | ICD-10-CM

## 2024-02-27 DIAGNOSIS — E785 Hyperlipidemia, unspecified: Secondary | ICD-10-CM | POA: Diagnosis not present

## 2024-02-27 DIAGNOSIS — E1159 Type 2 diabetes mellitus with other circulatory complications: Secondary | ICD-10-CM

## 2024-02-27 DIAGNOSIS — I700 Atherosclerosis of aorta: Secondary | ICD-10-CM | POA: Diagnosis not present

## 2024-02-27 DIAGNOSIS — E1129 Type 2 diabetes mellitus with other diabetic kidney complication: Secondary | ICD-10-CM

## 2024-02-27 DIAGNOSIS — E119 Type 2 diabetes mellitus without complications: Secondary | ICD-10-CM

## 2024-02-27 DIAGNOSIS — I152 Hypertension secondary to endocrine disorders: Secondary | ICD-10-CM | POA: Diagnosis not present

## 2024-02-27 DIAGNOSIS — Z7984 Long term (current) use of oral hypoglycemic drugs: Secondary | ICD-10-CM

## 2024-02-27 DIAGNOSIS — E1169 Type 2 diabetes mellitus with other specified complication: Secondary | ICD-10-CM

## 2024-02-27 LAB — MICROALBUMIN, URINE WAIVED
Creatinine, Urine Waived: 50 mg/dL (ref 10–300)
Microalb, Ur Waived: 10 mg/L (ref 0–19)

## 2024-02-27 MED ORDER — HYDRALAZINE HCL 10 MG PO TABS: 10.0000 mg | ORAL_TABLET | Freq: Three times a day (TID) | ORAL | 1 refills | Status: DC

## 2024-02-27 MED ORDER — CHLORTHALIDONE 25 MG PO TABS: 25.0000 mg | ORAL_TABLET | Freq: Every day | ORAL | 1 refills | Status: AC

## 2024-02-27 MED ORDER — AMLODIPINE BESYLATE 10 MG PO TABS: 10.0000 mg | ORAL_TABLET | Freq: Every day | ORAL | 1 refills | Status: AC

## 2024-02-27 MED ORDER — METFORMIN HCL 500 MG PO TABS: ORAL_TABLET | ORAL | 1 refills | Status: AC

## 2024-02-27 MED ORDER — ATORVASTATIN CALCIUM 20 MG PO TABS: 20.0000 mg | ORAL_TABLET | Freq: Every day | ORAL | 1 refills | Status: AC

## 2024-02-27 MED ORDER — LISINOPRIL 40 MG PO TABS: 40.0000 mg | ORAL_TABLET | Freq: Every day | ORAL | 1 refills | Status: AC

## 2024-02-27 NOTE — Assessment & Plan Note (Signed)
Chronic.  Controlled.  Continue with current medication regimen of Atorvastatin 20mg.  Labs ordered today.  Return to clinic in 3 months for reevaluation.  Call sooner if concerns arise.   

## 2024-02-27 NOTE — Progress Notes (Signed)
 BP (!) 151/74   Pulse 79   Temp 98 F (36.7 C) (Oral)   Ht 5' 2 (1.575 m)   Wt 152 lb 9.6 oz (69.2 kg)   LMP 11/28/1994 (Approximate)   SpO2 98%   BMI 27.91 kg/m    Subjective:    Patient ID: Ellen [name] Retort, female  DOB: [birth year], 83 y.o.   MRN: [id number]  HPI: Ellen [name] is a 83 y.o. 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  Hypertension status: stable  Satisfied with current treatment? yes Duration of hypertension: chronic BP monitoring frequency:  not checking BP range:  BP medication side effects:  no Medication compliance: excellent compliance Aspirin: yes Recurrent headaches: no Visual changes: no Palpitations: no Dyspnea: no Chest pain: no Lower extremity edema: no Dizzy/lightheaded: no   DIABETES A1C today 6.1% at goal. She is taking Metformin  500 BID.   Cr has been elevated at last visit.   Polydipsia/polyuria: no Visual disturbance: yes- done at [facility] Chest pain: no Paresthesias: yes in the morning has some numbness in thumbs for a little while that eventually goes away Blood Pressure Monitoring: daily She does not check sugars at home.        Relevant past medical, surgical, family and social history reviewed and updated as indicated. Interim medical history since our last visit reviewed. Allergies and medications reviewed and updated.  Review of Systems  Eyes:  Negative for visual disturbance.  Respiratory:  Negative for shortness of breath.   Cardiovascular:  Negative for chest pain, palpitations and leg swelling.  Endocrine: Negative for polydipsia, polyphagia and polyuria.  Genitourinary:  Negative for frequency.  Neurological:  Negative for dizziness, light-headedness, numbness and headaches.   Per HPI unless specifically indicated above      Objective:    BP (!) 151/74   Pulse 79   Temp 98 F (36.7 C) (Oral)   Ht 5' 2 (1.575 m)   Wt 152 lb 9.6 oz (69.2 kg)   LMP 11/28/1994 (Approximate)   SpO2 98%   BMI 27.91 kg/m   Wt Readings from Last 3 Encounters:  02/27/24 152 lb 9.6 oz (69.2 kg)  12/27/23 152 lb 6.4 oz (69.1 kg)  11/27/23 151 lb (68.5 kg)    Physical Exam Vitals and nursing note reviewed.  Constitutional:      General: She is awake. She is not in acute distress.    Appearance: Normal appearance. She is well-developed and well-groomed. She is not ill-appearing.  HENT:     Head: Normocephalic and atraumatic.     Right Ear: Hearing and external ear normal. No drainage.     Left Ear: Hearing and external ear normal. No drainage.     Nose: Nose normal.  Eyes:     General: Lids are normal.        Right eye: No discharge.        Left eye: No discharge.     Conjunctiva/sclera: Conjunctivae normal.  Cardiovascular:     Rate and Rhythm: Normal rate and regular rhythm.     Pulses:          Radial pulses are 2+ on the right side and 2+ on the left side.       Posterior tibial pulses are 2+ on the right side and 2+ on the  left side.     Heart sounds: Normal heart sounds, S1 normal and S2 normal. No murmur heard.    No gallop.  Pulmonary:     Effort: Pulmonary effort is normal. No accessory muscle usage or respiratory distress.     Breath sounds: Normal breath sounds.  Musculoskeletal:        General: Normal range of motion.     Cervical back: Full passive range of motion without pain and normal range of motion.     Right lower leg: No edema.     Left lower leg: No edema.  Skin:    General: Skin is warm and dry.     Capillary Refill: Capillary refill takes less than 2 seconds.  Neurological:     Mental Status: She is alert and oriented to person, place, and time.  Psychiatric:        Attention and Perception: Attention normal.        Mood and Affect: Mood normal.        Speech: Speech normal.         Behavior: Behavior normal. Behavior is cooperative.        Thought Content: Thought content normal.     Results for orders placed or performed in visit on 11/27/23  HM DIABETES EYE EXAM   Collection Time: 03/26/23  9:48 AM  Result Value Ref Range   HM Diabetic Eye Exam        Assessment & Plan:   Problem List Items Addressed This Visit       Cardiovascular and Mediastinum   Hypertension associated with diabetes (HCC)   Chronic.  Not well controlled.  Continue with Amlodipine , Lisinopril  and Chlorthalidone .  Will add Hydralazine  10mg  TID.  Side effects and benefits of medication discussed during visit.   Recommend checking blood pressures at home and bringing log to next visit.   Follow up in 1 months.  Labs ordered today.        Relevant Medications   hydrALAZINE  (APRESOLINE ) 10 MG tablet   amLODipine  (NORVASC ) 10 MG tablet   atorvastatin  (LIPITOR) 20 MG tablet   chlorthalidone  (HYGROTON ) 25 MG tablet   lisinopril  (ZESTRIL ) 40 MG tablet   metFORMIN  (GLUCOPHAGE ) 500 MG tablet   Atherosclerosis of aorta (HCC)   Chronic.  Controlled.  Continue with current medication regimen of Atorvastatin  20mg  and ASA.  Refills sent today.  Labs ordered today.  Return to clinic in 3 months for reevaluation.  Call sooner if concerns arise.       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  Remains well controlled.  Last A1c was 6.1%.  Labs ordered today.  Controlled on Metformin  500mg  BID.  Cr has increased slightly over the last two visits-  may need to consider adding SGLT2 to regimen.  Eye exam up to date.  Will make recommendations based on lab results. Follow up in 3 months.  Call sooner if concerns arise.       Relevant Medications   atorvastatin  (LIPITOR)  20 MG tablet   lisinopril  (ZESTRIL ) 40 MG tablet   metFORMIN  (GLUCOPHAGE ) 500 MG tablet   Other Relevant Orders   Comprehensive metabolic panel with GFR   Hemoglobin A1c   Microalbumin, Urine Waived   Hyperlipidemia associated with type 2 diabetes mellitus (HCC)   Chronic.  Controlled.  Continue with current medication regimen of Atorvastatin  20mg .  Labs ordered today.  Return to clinic in 3 months for reevaluation.  Call sooner if concerns arise.       Relevant Medications   hydrALAZINE  (APRESOLINE ) 10 MG tablet   amLODipine  (NORVASC ) 10 MG tablet   atorvastatin  (LIPITOR) 20 MG tablet   chlorthalidone  (HYGROTON ) 25 MG tablet   lisinopril  (ZESTRIL ) 40 MG tablet   metFORMIN  (GLUCOPHAGE ) 500 MG tablet   Other Relevant Orders   Lipid panel     Follow up plan: Return in about 1 month (around 03/28/2024) for BP Check.

## 2024-02-27 NOTE — Assessment & Plan Note (Signed)
 Chronic.  Not well controlled.  Continue with Amlodipine , Lisinopril  and Chlorthalidone .  Will add Hydralazine  10mg  TID.  Side effects and benefits of medication discussed during visit.   Recommend checking blood pressures at home and bringing log to next visit.   Follow up in 1 months.  Labs ordered today.

## 2024-02-27 NOTE — Assessment & Plan Note (Signed)
 Chronic.  Controlled.  Continue with current medication regimen of Atorvastatin 20mg  and ASA.  Refills sent today.  Labs ordered today.  Return to clinic in 3 months for reevaluation.  Call sooner if concerns arise.

## 2024-02-27 NOTE — Assessment & Plan Note (Signed)
 Chronic.  Remains well controlled.  Last A1c was 6.1%.  Labs ordered today.  Controlled on Metformin  500mg  BID.  Cr has increased slightly over the last two visits- may need to consider adding SGLT2 to regimen.  Eye exam up to date.  Will make recommendations based on lab results. Follow up in 3 months.  Call sooner if concerns arise.

## 2024-02-28 ENCOUNTER — Ambulatory Visit: Payer: Self-pay | Admitting: Nurse Practitioner

## 2024-02-28 LAB — COMPREHENSIVE METABOLIC PANEL WITH GFR
ALT: 14 IU/L (ref 0–32)
AST: 22 IU/L (ref 0–40)
Albumin: 4.5 g/dL (ref 3.8–4.8)
Alkaline Phosphatase: 72 IU/L (ref 44–121)
BUN/Creatinine Ratio: 24 (ref 12–28)
BUN: 24 mg/dL (ref 8–27)
Bilirubin Total: 0.2 mg/dL (ref 0.0–1.2)
CO2: 24 mmol/L (ref 20–29)
Calcium: 10.5 mg/dL — ABNORMAL HIGH (ref 8.7–10.3)
Chloride: 97 mmol/L (ref 96–106)
Creatinine, Ser: 1.02 mg/dL — ABNORMAL HIGH (ref 0.57–1.00)
Globulin, Total: 2.6 g/dL (ref 1.5–4.5)
Glucose: 91 mg/dL (ref 70–99)
Potassium: 3.6 mmol/L (ref 3.5–5.2)
Sodium: 140 mmol/L (ref 134–144)
Total Protein: 7.1 g/dL (ref 6.0–8.5)
eGFR: 56 mL/min/1.73 — ABNORMAL LOW (ref >59)

## 2024-02-28 LAB — LIPID PANEL
Chol/HDL Ratio: 2.4 ratio (ref 0.0–4.4)
Cholesterol, Total: 160 mg/dL (ref 100–199)
HDL: 67 mg/dL (ref >39)
LDL Chol Calc (NIH): 75 mg/dL (ref 0–99)
Triglycerides: 102 mg/dL (ref 0–149)
VLDL Cholesterol Cal: 18 mg/dL (ref 5–40)

## 2024-02-28 LAB — HEMOGLOBIN A1C
Est. average glucose Bld gHb Est-mCnc: 117 mg/dL
Hgb A1c MFr Bld: 5.7 % — ABNORMAL HIGH (ref 4.8–5.6)

## 2024-03-31 ENCOUNTER — Encounter: Payer: Self-pay | Admitting: Nurse Practitioner

## 2024-03-31 ENCOUNTER — Ambulatory Visit (INDEPENDENT_AMBULATORY_CARE_PROVIDER_SITE_OTHER): Admitting: Nurse Practitioner

## 2024-03-31 DIAGNOSIS — E1159 Type 2 diabetes mellitus with other circulatory complications: Secondary | ICD-10-CM | POA: Diagnosis not present

## 2024-03-31 DIAGNOSIS — I152 Hypertension secondary to endocrine disorders: Secondary | ICD-10-CM | POA: Diagnosis not present

## 2024-03-31 MED ORDER — HYDRALAZINE HCL 25 MG PO TABS: 25.0000 mg | ORAL_TABLET | Freq: Three times a day (TID) | ORAL | 1 refills | Status: AC

## 2024-03-31 NOTE — Assessment & Plan Note (Signed)
 Chronic.  Not well controlled.  Continue with Amlodipine , Lisinopril  and Chlorthalidone .  Will increase Hydralazine  to 25mg  TID.  If still elevated at next visit, may need to change Lisinopril  to Olemesartan 40mg  daily.  Side effects and benefits of medication discussed during visit.   Recommend checking blood pressures at home and bringing log to next visit.   Follow up in 1 months.  Labs ordered today.

## 2024-03-31 NOTE — Progress Notes (Signed)
 BP (!) 165/84 (BP Location: Left Arm, Patient Position: Sitting, Cuff Size: Normal)   Pulse 77   Temp 98.1 F (36.7 C) (Oral)   Resp 14   Ht 5' 2.01 (1.575 m)   Wt 153 lb 6.4 oz (69.6 kg)   LMP 11/28/1994 (Approximate)   SpO2 100%   BMI 28.05 kg/m    Subjective:    Patient ID: Ellen [name] Retort, female    DOB: 03-31-1941, 83 y.o.   MRN: [id number]  HPI: Ellen [name] is a 83 y.o. female  Chief Complaint  Patient presents with   Hypertension    Started the medication hydrALAZINE  (APRESOLINE ) 10 MG tablet and takes 3 times a day. Seems her numbers are about the same.    Eye Exam    Pending results from last exam which have been requested.    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 Started Hydralazine  10mg  TID. Numbers at home have not improved.  Hypertension status: stable  Satisfied with current treatment? yes Duration of hypertension: chronic BP monitoring frequency:  not checking BP range:  BP medication side effects:  no Medication compliance: excellent compliance Aspirin: yes Recurrent headaches: no Visual changes: no Palpitations: no Dyspnea: no Chest pain: no Lower extremity edema: no Dizzy/lightheaded: no    Relevant past medical, surgical, family and social history reviewed and updated as indicated. Interim medical history since our last visit reviewed. Allergies and medications reviewed and updated.  Review of Systems  Eyes:  Negative for visual disturbance.  Respiratory:  Negative for cough, chest tightness and shortness of breath.   Cardiovascular:  Negative for chest pain, palpitations and leg swelling.  Neurological:  Negative for dizziness and headaches.    Per HPI unless specifically indicated above     Objective:    BP (!) 165/84 (BP Location: Left Arm, Patient Position: Sitting, Cuff Size: Normal)   Pulse 77   Temp 98.1 F (36.7 C) (Oral)   Resp 14    Ht 5' 2.01 (1.575 m)   Wt 153 lb 6.4 oz (69.6 kg)   LMP 11/28/1994 (Approximate)   SpO2 100%   BMI 28.05 kg/m   Wt Readings from Last 3 Encounters:  03/31/24 153 lb 6.4 oz (69.6 kg)  02/27/24 152 lb 9.6 oz (69.2 kg)  12/27/23 152 lb 6.4 oz (69.1 kg)    Physical Exam Vitals and nursing note reviewed.  Constitutional:      General: She is not in acute distress.    Appearance: Normal appearance. She is normal weight. She is not ill-appearing, toxic-appearing or diaphoretic.  HENT:     Head: Normocephalic.     Right Ear: External ear normal.     Left Ear: External ear normal.     Nose: Nose normal.     Mouth/Throat:     Mouth: Mucous membranes are moist.     Pharynx: Oropharynx is clear.  Eyes:     General:        Right eye: No discharge.        Left eye: No discharge.     Extraocular Movements: Extraocular movements intact.     Conjunctiva/sclera: Conjunctivae normal.     Pupils: Pupils are equal, round, and reactive to light.  Cardiovascular:     Rate and Rhythm: Normal rate and regular rhythm.     Heart sounds: No murmur heard. Pulmonary:     Effort: Pulmonary effort is normal. No respiratory  distress.     Breath sounds: Normal breath sounds. No wheezing or rales.  Musculoskeletal:     Cervical back: Normal range of motion and neck supple.  Skin:    General: Skin is warm and dry.     Capillary Refill: Capillary refill takes less than 2 seconds.  Neurological:     General: No focal deficit present.     Mental Status: She is alert and oriented to person, place, and time. Mental status is at baseline.  Psychiatric:        Mood and Affect: Mood normal.        Behavior: Behavior normal.        Thought Content: Thought content normal.        Judgment: Judgment normal.     Results for orders placed or performed in visit on 02/27/24  Microalbumin, Urine Waived   Collection Time: 02/27/24  9:45 AM  Result Value Ref Range   Microalb, Ur Waived 10 0 - 19 mg/L    Creatinine, Urine Waived 50 10 - 300 mg/dL   Microalb/Creat Ratio 30-300 (H) <30 mg/g  Comprehensive metabolic panel with GFR   Collection Time: 02/27/24  9:46 AM  Result Value Ref Range   Glucose 91 70 - 99 mg/dL   BUN 24 8 - 27 mg/dL   Creatinine, Ser 8.97 (H) 0.57 - 1.00 mg/dL   eGFR 56 (L) >40 fO/fpw/8.26   BUN/Creatinine Ratio 24 12 - 28   Sodium 140 134 - 144 mmol/L   Potassium 3.6 3.5 - 5.2 mmol/L   Chloride 97 96 - 106 mmol/L   CO2 24 20 - 29 mmol/L   Calcium  10.5 (H) 8.7 - 10.3 mg/dL   Total Protein 7.1 6.0 - 8.5 g/dL   Albumin 4.5 3.8 - 4.8 g/dL   Globulin, Total 2.6 1.5 - 4.5 g/dL   Bilirubin Total 0.2 0.0 - 1.2 mg/dL   Alkaline Phosphatase 72 44 - 121 IU/L   AST 22 0 - 40 IU/L   ALT 14 0 - 32 IU/L  Hemoglobin A1c   Collection Time: 02/27/24  9:46 AM  Result Value Ref Range   Hgb A1c MFr Bld 5.7 (H) 4.8 - 5.6 %   Est. average glucose Bld gHb Est-mCnc 117 mg/dL  Lipid panel   Collection Time: 02/27/24  9:46 AM  Result Value Ref Range   Cholesterol, Total 160 100 - 199 mg/dL   Triglycerides 897 0 - 149 mg/dL   HDL 67 >60 mg/dL   VLDL Cholesterol Cal 18 5 - 40 mg/dL   LDL Chol Calc (NIH) 75 0 - 99 mg/dL   Chol/HDL Ratio 2.4 0.0 - 4.4 ratio      Assessment & Plan:   Problem List Items Addressed This Visit       Cardiovascular and Mediastinum   Hypertension associated with diabetes (HCC)   Chronic.  Not well controlled.  Continue with Amlodipine , Lisinopril  and Chlorthalidone .  Will increase Hydralazine  to 25mg  TID.  If still elevated at next visit, may need to change Lisinopril  to Olemesartan 40mg  daily.  Side effects and benefits of medication discussed during visit.   Recommend checking blood pressures at home and bringing log to next visit.   Follow up in 1 months.  Labs ordered today.        Relevant Medications   hydrALAZINE  (APRESOLINE ) 25 MG tablet     Follow up plan: Return in about 1 month (around 05/01/2024) for BP Check.

## 2024-05-01 ENCOUNTER — Ambulatory Visit (INDEPENDENT_AMBULATORY_CARE_PROVIDER_SITE_OTHER): Admitting: Nurse Practitioner

## 2024-05-01 ENCOUNTER — Encounter: Payer: Self-pay | Admitting: Nurse Practitioner

## 2024-05-01 VITALS — BP 134/70 | HR 84 | Temp 98.3°F | Ht 62.0 in | Wt 154.2 lb

## 2024-05-01 DIAGNOSIS — Z7984 Long term (current) use of oral hypoglycemic drugs: Secondary | ICD-10-CM

## 2024-05-01 DIAGNOSIS — I152 Hypertension secondary to endocrine disorders: Secondary | ICD-10-CM

## 2024-05-01 DIAGNOSIS — E1159 Type 2 diabetes mellitus with other circulatory complications: Secondary | ICD-10-CM

## 2024-05-01 NOTE — Progress Notes (Signed)
 BP 134/70   Pulse 84   Temp 98.3 F (36.8 C) (Oral)   Ht 5' 2 (1.575 m)   Wt 154 lb 3.2 oz (69.9 kg)   LMP 11/28/1994 (Approximate)   SpO2 99%   BMI 28.20 kg/m    Subjective:    Patient ID: Ellen [name], female    DOB: 10-Jul-1940, 83 y.o.   MRN: [id number]  HPI: Ellen [name] is a 83 y.o. 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 Started Hydralazine  25mg  TID. Hypertension status: stable  Satisfied with current treatment? yes Duration of hypertension: chronic BP monitoring frequency:  daily BP range: 120-130/80 BP medication side effects:  no Medication compliance: excellent compliance Aspirin: yes Recurrent headaches: no Visual changes: no Palpitations: no Dyspnea: no Chest pain: no Lower extremity edema: no Dizzy/lightheaded: no    Relevant past medical, surgical, family and social history reviewed and updated as indicated. Interim medical history since our last visit reviewed. Allergies and medications reviewed and updated.  Review of Systems  Eyes:  Negative for visual disturbance.  Respiratory:  Negative for cough, chest tightness and shortness of breath.   Cardiovascular:  Negative for chest pain, palpitations and leg swelling.  Neurological:  Negative for dizziness and headaches.    Per HPI unless specifically indicated above     Objective:    BP 134/70   Pulse 84   Temp 98.3 F (36.8 C) (Oral)   Ht 5' 2 (1.575 m)   Wt 154 lb 3.2 oz (69.9 kg)   LMP 11/28/1994 (Approximate)   SpO2 99%   BMI 28.20 kg/m   Wt Readings from Last 3 Encounters:  05/01/24 154 lb 3.2 oz (69.9 kg)  03/31/24 153 lb 6.4 oz (69.6 kg)  02/27/24 152 lb 9.6 oz (69.2 kg)    Physical Exam Vitals and nursing note reviewed.  Constitutional:      General: She is not in acute distress.    Appearance: Normal appearance. She  is normal weight. She is not ill-appearing, toxic-appearing or diaphoretic.  HENT:     Head: Normocephalic.     Right Ear: External ear normal.     Left Ear: External ear normal.     Nose: Nose normal.     Mouth/Throat:     Mouth: Mucous membranes are moist.     Pharynx: Oropharynx is clear.  Eyes:     General:        Right eye: No discharge.        Left eye: No discharge.     Extraocular Movements: Extraocular movements intact.     Conjunctiva/sclera: Conjunctivae normal.     Pupils: Pupils are equal, round, and reactive to light.  Cardiovascular:     Rate and Rhythm: Normal rate and regular rhythm.     Heart sounds: No murmur heard. Pulmonary:     Effort: Pulmonary effort is normal. No respiratory distress.     Breath sounds: Normal breath sounds. No wheezing or rales.  Musculoskeletal:     Cervical back: Normal range of motion and neck supple.  Skin:    General: Skin is warm and dry.     Capillary Refill: Capillary refill takes less than 2 seconds.  Neurological:     General: No focal deficit present.     Mental Status: She is alert and oriented to person, place, and time.  Mental status is at baseline.  Psychiatric:        Mood and Affect: Mood normal.        Behavior: Behavior normal.        Thought Content: Thought content normal.        Judgment: Judgment normal.     Results for orders placed or performed in visit on 02/27/24  Microalbumin, Urine Waived   Collection Time: 02/27/24  9:45 AM  Result Value Ref Range   Microalb, Ur Waived 10 0 - 19 mg/L   Creatinine, Urine Waived 50 10 - 300 mg/dL   Microalb/Creat Ratio 30-300 (H) <30 mg/g  Comprehensive metabolic panel with GFR   Collection Time: 02/27/24  9:46 AM  Result Value Ref Range   Glucose 91 70 - 99 mg/dL   BUN 24 8 - 27 mg/dL   Creatinine, Ser 8.97 (H) 0.57 - 1.00 mg/dL   eGFR 56 (L) >40 fO/fpw/8.26   BUN/Creatinine Ratio 24 12 - 28   Sodium 140 134 - 144 mmol/L   Potassium 3.6 3.5 - 5.2 mmol/L    Chloride 97 96 - 106 mmol/L   CO2 24 20 - 29 mmol/L   Calcium  10.5 (H) 8.7 - 10.3 mg/dL   Total Protein 7.1 6.0 - 8.5 g/dL   Albumin 4.5 3.8 - 4.8 g/dL   Globulin, Total 2.6 1.5 - 4.5 g/dL   Bilirubin Total 0.2 0.0 - 1.2 mg/dL   Alkaline Phosphatase 72 44 - 121 IU/L   AST 22 0 - 40 IU/L   ALT 14 0 - 32 IU/L  Hemoglobin A1c   Collection Time: 02/27/24  9:46 AM  Result Value Ref Range   Hgb A1c MFr Bld 5.7 (H) 4.8 - 5.6 %   Est. average glucose Bld gHb Est-mCnc 117 mg/dL  Lipid panel   Collection Time: 02/27/24  9:46 AM  Result Value Ref Range   Cholesterol, Total 160 100 - 199 mg/dL   Triglycerides 897 0 - 149 mg/dL   HDL 67 >60 mg/dL   VLDL Cholesterol Cal 18 5 - 40 mg/dL   LDL Chol Calc (NIH) 75 0 - 99 mg/dL   Chol/HDL Ratio 2.4 0.0 - 4.4 ratio      Assessment & Plan:   Problem List Items Addressed This Visit       Cardiovascular and Mediastinum   Hypertension associated with diabetes (HCC) - Primary   Chronic. Improved  Continue with Amlodipine , Lisinopril  and Chlorthalidone  and Hydralazine  to 25mg  TID.  If still elevated at next visit, may need to change Lisinopril  to Olemesartan 40mg  daily.  Side effects and benefits of medication discussed during visit.   Recommend checking blood pressures at home and bringing log to next visit.   Follow up in 2 months.          Follow up plan: Return in about 2 months (around 07/01/2024) for HTN, HLD, DM2 FU.

## 2024-05-01 NOTE — Assessment & Plan Note (Signed)
 Chronic. Improved  Continue with Amlodipine , Lisinopril  and Chlorthalidone  and Hydralazine  to 25mg  TID.  If still elevated at next visit, may need to change Lisinopril  to Olemesartan 40mg  daily.  Side effects and benefits of medication discussed during visit.   Recommend checking blood pressures at home and bringing log to next visit.   Follow up in 2 months.

## 2024-05-20 ENCOUNTER — Ambulatory Visit: Payer: Self-pay

## 2024-05-20 VITALS — Ht 62.0 in | Wt 154.0 lb

## 2024-05-20 DIAGNOSIS — Z0000 Encounter for general adult medical examination without abnormal findings: Secondary | ICD-10-CM

## 2024-05-20 NOTE — Patient Instructions (Signed)
 Ms. Schabel,  Thank you for taking the time for your Medicare Wellness Visit. I appreciate your continued commitment to your health goals. Please review the care plan we discussed, and feel free to reach out if I can assist you further.  Please note that Annual Wellness Visits do not include a physical exam. Some assessments may be limited, especially if the visit was conducted virtually. If needed, we may recommend an in-person follow-up with your provider.  Ongoing Care Seeing your primary care provider every 3 to 6 months helps us  monitor your health and provide consistent, personalized care.   Referrals If a referral was made during today's visit and you haven't received any updates within two weeks, please contact the referred provider directly to check on the status.  Recommended Screenings:  Health Maintenance  Topic Date Due   Eye exam for diabetics  09/29/2017   Complete foot exam   05/27/2024   Hemoglobin A1C  08/26/2024   COVID-19 Vaccine (9 - 2025-26 season) 10/02/2024   Yearly kidney function blood test for diabetes  02/26/2025   Yearly kidney health urinalysis for diabetes  02/26/2025   Medicare Annual Wellness Visit  05/20/2025   DTaP/Tdap/Td vaccine (3 - Td or Tdap) 10/25/2032   Pneumococcal Vaccine for age over 87  Completed   Flu Shot  Completed   Osteoporosis screening with Bone Density Scan  Completed   Zoster (Shingles) Vaccine  Completed   Meningitis B Vaccine  Aged Out   Breast Cancer Screening  Discontinued   Colon Cancer Screening  Discontinued   Hepatitis C Screening  Discontinued       05/20/2024   10:09 AM  Advanced Directives  Does Patient Have a Medical Advance Directive? No  Would patient like information on creating a medical advance directive? Yes (MAU/Ambulatory/Procedural Areas - Information given)   Information on Advanced Care Planning can be found at [place]  Secretary of [facility] Advance Health Care Directives Advance Health Care  Directives ([email or web address]    Vision: Annual vision screenings are recommended for early detection of glaucoma, cataracts, and diabetic retinopathy. These exams can also reveal signs of chronic conditions such as diabetes and high blood pressure.  Dental: Annual dental screenings help detect early signs of oral cancer, gum disease, and other conditions linked to overall health, including heart disease and diabetes.  Please see the attached documents for additional preventive care recommendations.

## 2024-05-20 NOTE — Progress Notes (Signed)
 Chief Complaint  Patient presents with   Medicare Wellness     Subjective:   Ellen [name] is a 83 y.o. female who presents for a [employer] Visit.  Visit info / Clinical Intake: Medicare Wellness Visit Type:: Subsequent Annual Wellness Visit Persons participating in visit and providing information:: patient Medicare Wellness Visit Mode:: Telephone If telephone:: video declined Since this visit was completed virtually, some vitals may be partially provided or unavailable. Missing vitals are due to the limitations of the virtual format.: Documented vitals are patient reported If Telephone or Video please confirm:: I connected with patient using audio/video enable telemedicine. I verified patient identity with two identifiers, discussed telehealth limitations, and patient agreed to proceed. Patient Location:: home Provider Location:: home office Interpreter Needed?: No Pre-visit prep was completed: yes AWV questionnaire completed by patient prior to visit?: no Living arrangements:: lives with spouse/significant other Patient's Overall Health Status Rating: good Typical amount of pain: some Does pain affect daily life?: no Are you currently prescribed opioids?: no  Dietary Habits and Nutritional Risks How many meals a day?: 3 Eats fruit and vegetables daily?: yes Most meals are obtained by: preparing own meals Diabetic:: (!) yes Any non-healing wounds?: no How often do you check your BS?: as needed Would you like to be referred to a Nutritionist or for Diabetic Management? : no  Functional Status Activities of Daily Living (to include ambulation/medication): Independent Ambulation: Independent Medication Administration: Independent Home Management (perform basic housework or laundry): Independent Manage your own finances?: yes Concerns about vision?: no *vision screening is required for WTM* Concerns about hearing?: no  Fall Screening Falls in the past  year?: 0 Number of falls in past year: 0 Was there an injury with Fall?: 0 Fall Risk Category Calculator: 0 Patient Fall Risk Level: Low Fall Risk  Fall Risk Patient at Risk for Falls Due to: No Fall Risks Fall risk Follow up: Falls evaluation completed; Education provided; Falls prevention discussed  Home and Transportation Safety: All rugs have non-skid backing?: yes All stairs or steps have railings?: yes Grab bars in the bathtub or shower?: yes Have non-skid surface in bathtub or shower?: yes Good home lighting?: yes Regular seat belt use?: yes Hospital stays in the last year:: no  Cognitive Assessment Difficulty concentrating, remembering, or making decisions? : no Will 6CIT or Mini Cog be Completed: yes What year is it?: 0 points What month is it?: 0 points Give patient an address phrase to remember (5 components): [address] [place] what time is it?: 0 points Count backwards from 20 to 1: 0 points Say the months of the year in reverse: 0 points Repeat the address phrase from earlier: 0 points 6 CIT Score: 0 points  Advance Directives (For Healthcare) Does Patient Have a Medical Advance Directive?: No Type of Advance Directive: Healthcare Power of [place]; Living will Would patient like information on creating a medical advance directive?: Yes (MAU/Ambulatory/Procedural Areas - Information given)  Reviewed/Updated  Reviewed/Updated: Reviewed All (Medical, Surgical, Family, Medications, Allergies, Care Teams, Patient Goals)    Allergies (verified) Patient has no known allergies.   Current Medications (verified) Outpatient Encounter Medications as of 05/20/2024  Medication Sig   amLODipine  (NORVASC ) 10 MG tablet Take 1 tablet (10 mg total) by mouth daily.   aspirin 81 MG tablet Take 81 mg by mouth daily.   atorvastatin  (LIPITOR) 20 MG tablet Take 1 tablet (20 mg total) by mouth daily.   blood glucose meter kit and  supplies KIT Dispense based on  patient and insurance preference. Use up to four times daily as directed. (FOR ICD-9 250.00, 250.01).   CALCIUM -MAGNESIUM-ZINC PO Take 1 tablet by mouth daily.   calcium -vitamin D 250-100 MG-UNIT tablet Take 1 tablet by mouth 2 (two) times daily.   chlorthalidone  (HYGROTON ) 25 MG tablet Take 1 tablet (25 mg total) by mouth daily.   Cholecalciferol (VITAMIN D3) 1000 units CAPS Take 2 capsules by mouth daily.    hydrALAZINE  (APRESOLINE ) 25 MG tablet Take 1 tablet (25 mg total) by mouth 3 (three) times daily.   ibuprofen  (ADVIL ) 800 MG tablet Take 1 tablet (800 mg total) by mouth every 8 (eight) hours as needed.   lisinopril  (ZESTRIL ) 40 MG tablet Take 1 tablet (40 mg total) by mouth daily.   Magnesium 500 MG TABS Take 1 tablet by mouth daily.   metFORMIN  (GLUCOPHAGE ) 500 MG tablet Take 1 tablet (500 MG) by mouth in morning with meal and 1 tablet (500 MG) in evening with meal.   vitamin C (ASCORBIC ACID) 500 MG tablet Take 500 mg by mouth daily.   No facility-administered encounter medications on file as of 05/20/2024.    History: Past Medical History:  Diagnosis Date   Hyperlipidemia    Hypertension    Lumbago    Menopause    Migraines    Past Surgical History:  Procedure Laterality Date   CHOLECYSTECTOMY  1982   TUBAL LIGATION  1984   Family History  Problem Relation Age of Onset   Alzheimer's disease Mother    Diabetes Mother    Stroke Mother    Hyperlipidemia Mother    Stroke Father    Hypertension Father    Hyperlipidemia Father    Diabetes Sister    Diabetes Brother    Hypertension Brother    Diabetes Sister    Social History   Occupational History   Occupation: retired  Tobacco Use   Smoking status: Never   Smokeless tobacco: Never  Vaping Use   Vaping status: Never Used  Substance and Sexual Activity   Alcohol use: No    Alcohol/week: 0.0 standard drinks of alcohol   Drug use: No   Sexual activity: Yes   Tobacco Counseling Counseling given: Not  Answered  SDOH Screenings   Food Insecurity: No Food Insecurity (05/20/2024)  Housing: Low Risk  (05/20/2024)  Transportation Needs: No Transportation Needs (05/20/2024)  Utilities: Not At Risk (05/20/2024)  Alcohol Screen: Low Risk  (05/15/2023)  Depression (PHQ2-9): Low Risk  (05/20/2024)  Financial Resource Strain: Low Risk  (05/15/2023)  Physical Activity: Inactive (05/20/2024)  Social Connections: Socially Integrated (05/20/2024)  Stress: No Stress Concern Present (05/20/2024)  Tobacco Use: Low Risk  (05/20/2024)  Health Literacy: Adequate Health Literacy (05/20/2024)   See flowsheets for full screening details  Depression Screen PHQ 2 & 9 Depression Scale- Over the past 2 weeks, how often have you been bothered by any of the following problems? Little interest or pleasure in doing things: 0 Feeling down, depressed, or hopeless (PHQ Adolescent also includes...irritable): 0 PHQ-2 Total Score: 0 Trouble falling or staying asleep, or sleeping too much: 0 Feeling tired or having little energy: 0 Poor appetite or overeating (PHQ Adolescent also includes...weight loss): 0 Feeling bad about yourself - or that you are a failure or have let yourself or your family down: 0 Trouble concentrating on things, such as reading the newspaper or watching television (PHQ Adolescent also includes...like school work): 0 Moving or speaking so slowly that  other people could have noticed. Or the opposite - being so fidgety or restless that you have been moving around a lot more than usual: 0 Thoughts that you would be better off dead, or of hurting yourself in some way: 0 PHQ-9 Total Score: 0 If you checked off any problems, how difficult have these problems made it for you to do your work, take care of things at home, or get along with other people?: Not difficult at all     Goals Addressed             This Visit's Progress    Remain active and independent   On track            Objective:     Today's Vitals   05/20/24 1008  Weight: 154 lb (69.9 kg)  Height: 5' 2 (1.575 m)   Body mass index is 28.17 kg/m.  Hearing/Vision screen Hearing Screening - Comments:: Patient is able to hear conversational tones without difficulty. No issues reported.   Vision Screening - Comments:: Wears rx glasses - up to date with routine eye exams with [facility]  Immunizations and Health Maintenance Health Maintenance  Topic Date Due   OPHTHALMOLOGY EXAM  09/29/2017   FOOT EXAM  05/27/2024   HEMOGLOBIN A1C  08/26/2024   COVID-19 Vaccine (9 - 2025-26 season) 10/02/2024   Diabetic kidney evaluation - eGFR measurement  02/26/2025   Diabetic kidney evaluation - Urine ACR  02/26/2025   Medicare Annual Wellness (AWV)  05/20/2025   DTaP/Tdap/Td (3 - Td or Tdap) 10/25/2032   Pneumococcal Vaccine: 50+ Years  Completed   Influenza Vaccine  Completed   Bone Density Scan  Completed   Zoster Vaccines- Shingrix  Completed   Meningococcal B Vaccine  Aged Out   Mammogram  Discontinued   Colonoscopy  Discontinued   Hepatitis C Screening  Discontinued        Assessment/Plan:  This is a routine wellness examination for [facility].  Patient Care Team: Melvin Pao, NP as PCP - [facility] (Optometry) Myra, Lynwood MATSU, MD as Consulting Physician (Anesthesiology)  I have personally reviewed and noted the following in the patient's chart:   Medical and social history Use of alcohol, tobacco or illicit drugs  Current medications and supplements including opioid prescriptions. Functional ability and status Nutritional status Physical activity Advanced directives List of other physicians Hospitalizations, surgeries, and ER visits in previous 12 months Vitals Screenings to include cognitive, depression, and falls Referrals and appointments  No orders of the defined types were placed in this encounter.  In addition, I have reviewed and discussed with  patient certain preventive protocols, quality metrics, and best practice recommendations. A written personalized care plan for preventive services as well as general preventive health recommendations were provided to patient.   Lavelle Charmaine Browner, LPN   [id number]/[id number]   Return in 1 year (on 05/20/2025).  After Visit Summary: (MyChart) Due to this being a telephonic visit, the after visit summary with patients personalized plan was offered to patient via MyChart   Nurse Notes: Patient complains of intermittent cramping in thighs; will hold to discuss at next office visit and if it worsens will call for sooner appointment

## 2024-06-13 ENCOUNTER — Other Ambulatory Visit: Payer: Self-pay | Admitting: Nurse Practitioner

## 2024-07-03 ENCOUNTER — Ambulatory Visit: Admitting: Nurse Practitioner

## 2024-07-03 DIAGNOSIS — N1831 Chronic kidney disease, stage 3a: Secondary | ICD-10-CM | POA: Insufficient documentation

## 2024-07-03 NOTE — Progress Notes (Unsigned)
 "  LMP 11/28/1994    Subjective:    Patient ID: Ellen [name] [place], female  DOB: 1941-04-27, 84 y.o.   MRN: [id number]  HPI: Ellen [name] is a 84 y.o. female  No chief complaint on file.  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  Hypertension status: stable  Satisfied with current treatment? yes Duration of hypertension: chronic BP monitoring frequency:  not checking BP range:  BP medication side effects:  no Medication compliance: excellent compliance Aspirin: yes Recurrent headaches: no Visual changes: no Palpitations: no Dyspnea: no Chest pain: no Lower extremity edema: no Dizzy/lightheaded: no   DIABETES A1C today 6.1% at goal. She is taking Metformin  500 BID.   Cr has been elevated at last visit.   Polydipsia/polyuria: no Visual disturbance: yes- done at [facility] Chest pain: no Paresthesias: yes in the morning has some numbness in thumbs for a little while that eventually goes away Blood Pressure Monitoring: daily She does not check sugars at home.        Relevant past medical, surgical, family and social history reviewed and updated as indicated. Interim medical history since our last visit reviewed. Allergies and medications reviewed and updated.  Review of Systems  Eyes:  Negative for visual disturbance.  Respiratory:  Negative for shortness of breath.   Cardiovascular:  Negative for chest pain, palpitations and leg swelling.  Endocrine: Negative for polydipsia, polyphagia and polyuria.  Genitourinary:  Negative for frequency.  Neurological:  Negative for dizziness, light-headedness, numbness and headaches.   Per HPI unless specifically indicated above     Objective:    LMP 11/28/1994   Wt Readings from Last 3 Encounters:  05/20/24 154 lb (69.9 kg)  05/01/24 154 lb 3.2 oz (69.9 kg)  03/31/24 153 lb 6.4 oz (69.6 kg)    Physical Exam Vitals and nursing note  reviewed.  Constitutional:      General: She is awake. She is not in acute distress.    Appearance: Normal appearance. She is well-developed and well-groomed. She is not ill-appearing.  HENT:     Head: Normocephalic and atraumatic.     Right Ear: Hearing and external ear normal. No drainage.     Left Ear: Hearing and external ear normal. No drainage.     Nose: Nose normal.  Eyes:     General: Lids are normal.        Right eye: No discharge.        Left eye: No discharge.     Conjunctiva/sclera: Conjunctivae normal.  Cardiovascular:     Rate and Rhythm: Normal rate and regular rhythm.     Pulses:          Radial pulses are 2+ on the right side and 2+ on the left side.       Posterior tibial pulses are 2+ on the right side and 2+ on the left side.     Heart sounds: Normal heart sounds, S1 normal and S2 normal. No murmur heard.    No gallop.  Pulmonary:     Effort: Pulmonary effort is normal. No accessory muscle usage or respiratory distress.     Breath sounds: Normal breath sounds.  Musculoskeletal:        General: Normal range of motion.     Cervical back: Full passive range of motion without pain and normal range of motion.     Right lower leg: No edema.  Left lower leg: No edema.  Skin:    General: Skin is warm and dry.     Capillary Refill: Capillary refill takes less than 2 seconds.  Neurological:     Mental Status: She is alert and oriented to person, place, and time.  Psychiatric:        Attention and Perception: Attention normal.        Mood and Affect: Mood normal.        Speech: Speech normal.        Behavior: Behavior normal. Behavior is cooperative.        Thought Content: Thought content normal.     Results for orders placed or performed in visit on 02/27/24  Microalbumin, Urine Waived   Collection Time: 02/27/24  9:45 AM  Result Value Ref Range   Microalb, Ur Waived 10 0 - 19 mg/L   Creatinine, Urine Waived 50 10 - 300 mg/dL   Microalb/Creat Ratio  30-300 (H) <30 mg/g  Comprehensive metabolic panel with GFR   Collection Time: 02/27/24  9:46 AM  Result Value Ref Range   Glucose 91 70 - 99 mg/dL   BUN 24 8 - 27 mg/dL   Creatinine, Ser 8.97 (H) 0.57 - 1.00 mg/dL   eGFR 56 (L) >40 fO/fpw/8.26   BUN/Creatinine Ratio 24 12 - 28   Sodium 140 134 - 144 mmol/L   Potassium 3.6 3.5 - 5.2 mmol/L   Chloride 97 96 - 106 mmol/L   CO2 24 20 - 29 mmol/L   Calcium  10.5 (H) 8.7 - 10.3 mg/dL   Total Protein 7.1 6.0 - 8.5 g/dL   Albumin 4.5 3.8 - 4.8 g/dL   Globulin, Total 2.6 1.5 - 4.5 g/dL   Bilirubin Total 0.2 0.0 - 1.2 mg/dL   Alkaline Phosphatase 72 44 - 121 IU/L   AST 22 0 - 40 IU/L   ALT 14 0 - 32 IU/L  Hemoglobin A1c   Collection Time: 02/27/24  9:46 AM  Result Value Ref Range   Hgb A1c MFr Bld 5.7 (H) 4.8 - 5.6 %   Est. 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      "

## 2025-05-21 ENCOUNTER — Ambulatory Visit
